# Patient Record
Sex: Female | Born: 1955 | Race: White | Hispanic: No | Marital: Married | Smoking: Former smoker
Health system: Southern US, Community
[De-identification: ages and names within clinical notes are randomized; demographics above are authoritative.]

## PROBLEM LIST (undated history)

## (undated) DIAGNOSIS — E559 Vitamin D deficiency, unspecified: Secondary | ICD-10-CM

## (undated) DIAGNOSIS — M1990 Unspecified osteoarthritis, unspecified site: Secondary | ICD-10-CM

## (undated) DIAGNOSIS — R55 Syncope and collapse: Secondary | ICD-10-CM

## (undated) DIAGNOSIS — Z6839 Body mass index (BMI) 39.0-39.9, adult: Secondary | ICD-10-CM

## (undated) DIAGNOSIS — M311 Thrombotic microangiopathy: Secondary | ICD-10-CM

## (undated) DIAGNOSIS — E876 Hypokalemia: Secondary | ICD-10-CM

## (undated) DIAGNOSIS — Z780 Asymptomatic menopausal state: Secondary | ICD-10-CM

## (undated) DIAGNOSIS — K219 Gastro-esophageal reflux disease without esophagitis: Secondary | ICD-10-CM

## (undated) DIAGNOSIS — C801 Malignant (primary) neoplasm, unspecified: Secondary | ICD-10-CM

## (undated) DIAGNOSIS — F32A Depression, unspecified: Secondary | ICD-10-CM

## (undated) DIAGNOSIS — F329 Major depressive disorder, single episode, unspecified: Secondary | ICD-10-CM

## (undated) DIAGNOSIS — M3119 Other thrombotic microangiopathy: Secondary | ICD-10-CM

## (undated) DIAGNOSIS — R7309 Other abnormal glucose: Secondary | ICD-10-CM

## (undated) DIAGNOSIS — G43909 Migraine, unspecified, not intractable, without status migrainosus: Secondary | ICD-10-CM

## (undated) DIAGNOSIS — A4901 Methicillin susceptible Staphylococcus aureus infection, unspecified site: Secondary | ICD-10-CM

## (undated) DIAGNOSIS — F331 Major depressive disorder, recurrent, moderate: Secondary | ICD-10-CM

## (undated) DIAGNOSIS — I8390 Asymptomatic varicose veins of unspecified lower extremity: Secondary | ICD-10-CM

## (undated) DIAGNOSIS — Z5189 Encounter for other specified aftercare: Secondary | ICD-10-CM

## (undated) DIAGNOSIS — M797 Fibromyalgia: Secondary | ICD-10-CM

## (undated) DIAGNOSIS — R51 Headache: Secondary | ICD-10-CM

## (undated) DIAGNOSIS — I872 Venous insufficiency (chronic) (peripheral): Secondary | ICD-10-CM

## (undated) DIAGNOSIS — M7662 Achilles tendinitis, left leg: Secondary | ICD-10-CM

## (undated) DIAGNOSIS — Z862 Personal history of diseases of the blood and blood-forming organs and certain disorders involving the immune mechanism: Secondary | ICD-10-CM

## (undated) DIAGNOSIS — N761 Subacute and chronic vaginitis: Secondary | ICD-10-CM

## (undated) DIAGNOSIS — R600 Localized edema: Secondary | ICD-10-CM

## (undated) DIAGNOSIS — E785 Hyperlipidemia, unspecified: Secondary | ICD-10-CM

## (undated) HISTORY — DX: Unspecified osteoarthritis, unspecified site: M19.90

## (undated) HISTORY — DX: Hypokalemia: E87.6

## (undated) HISTORY — DX: Asymptomatic menopausal state: Z78.0

## (undated) HISTORY — DX: Vitamin D deficiency, unspecified: E55.9

## (undated) HISTORY — DX: Syncope and collapse: R55

## (undated) HISTORY — DX: Morbid (severe) obesity due to excess calories: E66.01

## (undated) HISTORY — DX: Venous insufficiency (chronic) (peripheral): I87.2

## (undated) HISTORY — DX: Methicillin susceptible Staphylococcus aureus infection, unspecified site: A49.01

## (undated) HISTORY — DX: Asymptomatic varicose veins of unspecified lower extremity: I83.90

## (undated) HISTORY — DX: Localized edema: R60.0

## (undated) HISTORY — DX: Other abnormal glucose: R73.09

## (undated) HISTORY — DX: Fibromyalgia: M79.7

## (undated) HISTORY — DX: Thrombotic microangiopathy: M31.1

## (undated) HISTORY — DX: Personal history of diseases of the blood and blood-forming organs and certain disorders involving the immune mechanism: Z86.2

## (undated) HISTORY — DX: Gastro-esophageal reflux disease without esophagitis: K21.9

## (undated) HISTORY — DX: Subacute and chronic vaginitis: N76.1

## (undated) HISTORY — DX: Other thrombotic microangiopathy: M31.19

## (undated) HISTORY — DX: Hyperlipidemia, unspecified: E78.5

## (undated) HISTORY — DX: Achilles tendinitis, left leg: M76.62

## (undated) HISTORY — DX: Major depressive disorder, recurrent, moderate: F33.1

## (undated) HISTORY — PX: REVISION TOTAL HIP ARTHROPLASTY: SHX766

## (undated) HISTORY — DX: Body mass index (BMI) 39.0-39.9, adult: Z68.39

## (undated) HISTORY — DX: Migraine, unspecified, not intractable, without status migrainosus: G43.909

## (undated) MED FILL — Oxaliplatin IV Soln 50 MG/10ML: INTRAVENOUS | Qty: 50 | Status: AC

## (undated) MED FILL — Oxaliplatin IV Soln 100 MG/20ML: INTRAVENOUS | Qty: 60 | Status: AC

## (undated) MED FILL — Ravulizumab-cwvz IV Soln 300 MG/3ML (100 MG/ML): INTRAVENOUS | Qty: 27 | Status: AC

## (undated) MED FILL — Meningococcal (A, C, Y, and W-135) Oligo Conj Vac IM Soln: INTRAMUSCULAR | Qty: 0.5 | Status: AC

## (undated) MED FILL — Ravulizumab-cwvz IV Soln 300 MG/3ML (100 MG/ML): INTRAVENOUS | Qty: 33 | Status: AC

## (undated) MED FILL — Meningococcal Vac B (Recomb OMV Adjuv) Inj Prefilled Syringe: INTRAMUSCULAR | Qty: 0.5 | Status: AC

---
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
## 2006-01-01 ENCOUNTER — Ambulatory Visit: Payer: Self-pay | Admitting: Gastroenterology

## 2009-08-22 HISTORY — PX: JOINT REPLACEMENT: SHX530

## 2009-09-12 ENCOUNTER — Inpatient Hospital Stay (HOSPITAL_COMMUNITY): Admission: RE | Admit: 2009-09-12 | Discharge: 2009-09-15 | Payer: Self-pay | Admitting: Orthopedic Surgery

## 2010-06-23 ENCOUNTER — Encounter: Admission: RE | Admit: 2010-06-23 | Discharge: 2010-06-23 | Payer: Self-pay | Admitting: Orthopedic Surgery

## 2010-08-31 ENCOUNTER — Encounter: Admission: RE | Admit: 2010-08-31 | Discharge: 2010-08-31 | Payer: Self-pay | Admitting: Orthopedic Surgery

## 2010-09-08 ENCOUNTER — Encounter: Admission: RE | Admit: 2010-09-08 | Discharge: 2010-09-08 | Payer: Self-pay | Admitting: Orthopedic Surgery

## 2010-09-16 ENCOUNTER — Inpatient Hospital Stay (HOSPITAL_COMMUNITY): Admission: RE | Admit: 2010-09-16 | Discharge: 2010-09-18 | Payer: Self-pay | Admitting: Orthopedic Surgery

## 2010-10-11 ENCOUNTER — Inpatient Hospital Stay (HOSPITAL_COMMUNITY)
Admission: RE | Admit: 2010-10-11 | Discharge: 2010-10-14 | Payer: Self-pay | Source: Home / Self Care | Attending: Orthopedic Surgery | Admitting: Orthopedic Surgery

## 2010-10-28 ENCOUNTER — Emergency Department (HOSPITAL_COMMUNITY)
Admission: EM | Admit: 2010-10-28 | Discharge: 2010-10-28 | Payer: Self-pay | Source: Home / Self Care | Admitting: Emergency Medicine

## 2011-01-01 LAB — BASIC METABOLIC PANEL
BUN: 12 mg/dL (ref 6–23)
BUN: 8 mg/dL (ref 6–23)
BUN: 9 mg/dL (ref 6–23)
CO2: 27 mEq/L (ref 19–32)
Calcium: 8.9 mg/dL (ref 8.4–10.5)
Chloride: 107 mEq/L (ref 96–112)
Creatinine, Ser: 0.65 mg/dL (ref 0.4–1.2)
Creatinine, Ser: 0.69 mg/dL (ref 0.4–1.2)
Creatinine, Ser: 0.89 mg/dL (ref 0.4–1.2)
GFR calc Af Amer: >60 mL/min (ref >60)
GFR calc non Af Amer: >60 mL/min (ref >60)
GFR calc non Af Amer: >60 mL/min (ref >60)
Glucose, Bld: 117 mg/dL — ABNORMAL HIGH (ref 70–99)
Glucose, Bld: 170 mg/dL — ABNORMAL HIGH (ref 70–99)

## 2011-01-01 LAB — CBC
MCH: 27.1 pg (ref 26.0–34.0)
MCH: 27.5 pg (ref 26.0–34.0)
MCHC: 31.3 g/dL (ref 30.0–36.0)
MCV: 84.5 fL (ref 78.0–100.0)
MCV: 84.9 fL (ref 78.0–100.0)
Platelets: 207 10*3/uL (ref 150–400)
Platelets: 207 10*3/uL (ref 150–400)
Platelets: 225 10*3/uL (ref 150–400)
Platelets: 263 10*3/uL (ref 150–400)
RBC: 3.16 MIL/uL — ABNORMAL LOW (ref 3.87–5.11)
RDW: 14.4 % (ref 11.5–15.5)
RDW: 14.6 % (ref 11.5–15.5)
RDW: 14.7 % (ref 11.5–15.5)
RDW: 14.8 % (ref 11.5–15.5)
WBC: 7.3 10*3/uL (ref 4.0–10.5)
WBC: 7.5 10*3/uL (ref 4.0–10.5)

## 2011-01-01 LAB — PROTIME-INR
INR: 1.06 (ref 0.00–1.49)
Prothrombin Time: 14 seconds (ref 11.6–15.2)

## 2011-01-01 LAB — DIFFERENTIAL
Basophils Absolute: 0 10*3/uL (ref 0.0–0.1)
Eosinophils Relative: 2 % (ref 0–5)
Lymphocytes Relative: 24 % (ref 12–46)
Neutro Abs: 4.9 10*3/uL (ref 1.7–7.7)
Neutrophils Relative %: 66 % (ref 43–77)

## 2011-01-01 LAB — WOUND CULTURE

## 2011-01-01 LAB — APTT: aPTT: 38 seconds — ABNORMAL HIGH (ref 24–37)

## 2011-01-01 LAB — URINALYSIS, ROUTINE W REFLEX MICROSCOPIC
Glucose, UA: NEGATIVE mg/dL
Protein, ur: NEGATIVE mg/dL

## 2011-01-01 LAB — TYPE AND SCREEN
Antibody Screen: NEGATIVE
Unit division: 0

## 2011-01-01 LAB — GRAM STAIN

## 2011-01-01 LAB — ANAEROBIC CULTURE

## 2011-01-02 LAB — WOUND CULTURE

## 2011-01-02 LAB — BASIC METABOLIC PANEL
BUN: 13 mg/dL (ref 6–23)
CO2: 25 mEq/L (ref 19–32)
CO2: 27 mEq/L (ref 19–32)
Calcium: 8.9 mg/dL (ref 8.4–10.5)
Chloride: 103 mEq/L (ref 96–112)
Chloride: 105 mEq/L (ref 96–112)
Chloride: 106 mEq/L (ref 96–112)
Creatinine, Ser: 0.78 mg/dL (ref 0.4–1.2)
GFR calc Af Amer: >60 mL/min (ref >60)
GFR calc Af Amer: >60 mL/min (ref >60)
Glucose, Bld: 90 mg/dL (ref 70–99)
Potassium: 4.2 mEq/L (ref 3.5–5.1)
Potassium: 4.7 mEq/L (ref 3.5–5.1)
Sodium: 139 mEq/L (ref 135–145)

## 2011-01-02 LAB — CBC
HCT: 27.6 % — ABNORMAL LOW (ref 36.0–46.0)
HCT: 35.2 % — ABNORMAL LOW (ref 36.0–46.0)
Hemoglobin: 10.2 g/dL — ABNORMAL LOW (ref 12.0–15.0)
Hemoglobin: 9.4 g/dL — ABNORMAL LOW (ref 12.0–15.0)
MCH: 28.2 pg (ref 26.0–34.0)
MCH: 28.4 pg (ref 26.0–34.0)
MCHC: 34.3 g/dL (ref 30.0–36.0)
MCV: 82.7 fL (ref 78.0–100.0)
MCV: 82.9 fL (ref 78.0–100.0)
Platelets: 251 10*3/uL (ref 150–400)
RBC: 3.34 MIL/uL — ABNORMAL LOW (ref 3.87–5.11)
RBC: 3.59 MIL/uL — ABNORMAL LOW (ref 3.87–5.11)
RDW: 13.5 % (ref 11.5–15.5)
WBC: 10.4 10*3/uL (ref 4.0–10.5)

## 2011-01-02 LAB — DIFFERENTIAL
Basophils Absolute: 0 10*3/uL (ref 0.0–0.1)
Basophils Relative: 1 % (ref 0–1)
Eosinophils Relative: 1 % (ref 0–5)
Monocytes Absolute: 0.7 10*3/uL (ref 0.1–1.0)

## 2011-01-02 LAB — URINALYSIS, ROUTINE W REFLEX MICROSCOPIC
Bilirubin Urine: NEGATIVE
Ketones, ur: NEGATIVE mg/dL
Nitrite: NEGATIVE
pH: 7 (ref 5.0–8.0)

## 2011-01-02 LAB — ANAEROBIC CULTURE

## 2011-01-02 LAB — SURGICAL PCR SCREEN: Staphylococcus aureus: NEGATIVE

## 2011-01-10 NOTE — H&P (Signed)
[name], [name]                 ACCOUNT NO.:  [id number]  MEDICAL RECORD NO.:  [id number]          PATIENT TYPE:  INP  LOCATION:  1619                         FACILITY:  [facility]  PHYSICIAN:  Madlyn Frankel. Charlann Boxer, M.D.  DATE OF BIRTH:  1956/07/02  DATE OF ADMISSION:  09/16/2010 DATE OF DISCHARGE:  09/18/2010                             HISTORY & PHYSICAL   ADMISSION DIAGNOSES: 1. Failed right total hip arthroplasty. 2. History of thrombocytopenic purpura in 1990 requiring multiple     transfusions of platelets. 3. History of osteoarthritis and varicose veins. 4. Importantly, history of right total hip replacement that was     performed on September 12, 2009, one year ago. .  ADMITTING HISTORY:  [name] is a very pleasant 55 year old female patient of one of my partners who had had right total hip replacement performed on September 12, 2009.  She had some episodes of erythema requiring treatment with antibiotics.  She was initially seen and evaluated and felt to have a reaction to metal as her workup was inconclusive for infection.  She had a previous metal-on-metal implant.  Given the fact that she had persistent pain as well as this erythematous reaction, it was determined that she ought to consider arthroplasty revision surgery as an option at this point.  Risks and benefits were discussed in the office prior to getting her set up for surgery. Surgery was scheduled for September 16, 2010.  PAST MEDICAL HISTORY: 1. Osteoarthritis. 2. History of right hip osteoarthritis with right total hip     replacement performed on September 12, 2009. 3. Varicose veins. 4. History of thrombocytopenic purpura.  PAST SURGICAL HISTORY: 1. Tonsillectomy. 2. Right total hip replacement as noted.  FAMILY MEDICAL HISTORY: 1. Alzheimer's. 2. Prostate cancer. 3. Epilepsy.  DRUG ALLERGIES:  No known drug allergies.  CURRENT MEDICATIONS: 1. Naprosyn as needed. 2. Percocet.  SOCIAL HISTORY:   She is married, works as a Psychologist, forensic.  She denies any significant smoking or alcohol use.  She quit smoking 20 years ago.  PHYSICAL EXAMINATION:  VITAL SIGNS:  She is seen and evaluated at the time of the admission through the office and was afebrile with stable vital signs. NEUROLOGIC:  Her previous examination has been unchanged with no slurred speech or blurred vision. CHEST:  Clear to auscultation bilaterally without wheezing. HEART:  Regular rate and rhythm.  No murmur. ABDOMEN:  Soft, nontender, and overweight. EXTREMITIES:  She has erythematous area on the right lateral thigh area, otherwise well-healed incision.  No drainage. MUSCULOSKELETAL:  She has some concern for some fluctuance over the lateral side of the hip.  Hip range of motion is not significantly painful.  She otherwise is neurovascularly intact.  DIAGNOSTIC STUDIES:  Hospital workup of EKG, labs, and chest x-ray are all pending evaluation.  Plain films of the hips are available through our office.  ASSESSMENT:  Failed right total hip arthroplasty.  At this point, with presumed diagnosis of metal reaction.  PLAN:  After reviewing [name] and her husband her current situation, she is scheduled for same-day surgery for September 16, 2010, for  revision surgery versus resection.  Questions were encouraged, answered, and reviewed regarding this.  Consent will be obtained based on the discussion today.     Madlyn Frankel Charlann Boxer, M.D.     MDO/MEDQ  D:  01/08/2011  T:  01/09/2011  Job:  [id number]  Electronically Signed by Durene Romans M.D. on 01/10/2011 07:19:59 PM

## 2011-01-11 NOTE — Discharge Summary (Signed)
NAME:  [name], [name] NO.:  [id number]  MEDICAL RECORD NO.:  [id number]          PATIENT TYPE:  INP  LOCATION:  1619                         FACILITY:  [facility]  PHYSICIAN:  Madlyn Frankel. Charlann Boxer, M.D.  DATE OF BIRTH:  May 20, 1956  DATE OF ADMISSION:  10/11/2010 DATE OF DISCHARGE:  10/14/2010                              DISCHARGE SUMMARY   ADMITTING DIAGNOSIS:  Infected right hip wound.  DISCHARGE DIAGNOSES: 1. Infected right total hip replacement. 2. History of thrombotic thrombocytopenia in varicose veins,     questionable history of hepatitis C.  BRIEF HISTORY:  [name] is a 55 year old female with a history of right total hip replacement done in 2010.  She had early wound problems with marked cellulitic changes with minimal wound drainage.  She was treated conservatively.  She was ultimately seen and evaluated in consult due to this with elevated CRP and sed rate values concerning for either metallosis for infection; however, infection was not completely included in the diagnosis.  In November 2011, she moved back to the operating room for revision of right hip surgery to remove the metal-on- metal components and place ceramic on poly.  At that time there was not an obvious clinical concern for infection.  There appeared to be some fluid collection inside the joint but it did not appear to be obvious pus nonetheless in her postoperative period.  Now, we are month or so out, she has had increasing erythema with development of a fluctuant area in her incision line.  She was set up for an I and D of wound.  HOSPITAL COURSE:  The patient was admitted for surgery on October 11, 2010.  At that time, she was noted to have a right hip infection.  She underwent an I and D of the right hip with normal saline solution without polyethylene exchange.  She had her wound closed primarily.  She was then transferred to the recovery room and then subsequently to orthopedic  ward where she remained for the remainder of her stay until postop day 3.  PICC line was placed and she was started on IV Cubicin per my selection and her understanding of the toxicity with the potential need for bacterial side wall affected this medicine.  Arrangements were made for 6 weeks on this IV antibiotic.  Her wound cellulitic changes improved without significant wound drainage.  On postop day 3, she had a hematocrit of 27.2 with platelets of 207.  On postop day #2, was noted to have hematocrit down to 21.4 and subsequently was transfused with 2 units packed red blood cells.  The plan was to transfuse 2 units of blood and have her discharge the next day.  She was seen and evaluated by home health agency and set up for IV antibiotics at home.  DISCHARGE INSTRUCTIONS:  The patient will be discharged home with home health physical therapy in addition to nursing for IV antibiotics and setting this up.  She will follow up with me in 2 weeks for wound check and evaluation.  DISCHARGE MEDICATIONS: 1. Cubicin 500 mg every day for 40 days.  2. Colace 100 mg p.o. b.i.d. for constipation while on pain medicine. 3. Iron 325 mg 2 to 3 times a day for 3 weeks. 4. Norco 5/325 one to two tablets every 4-6 hours as needed for pain. 5. Robaxin 500 mg p.o. q.6 hours p.r.n. for muscle spasm and pain. 6. MiraLax 17 g p.o. daily for constipation while on pain medicine. 7. Xarelto 10 mg p.o. daily for 10 days and then aspirin for 4 weeks.  Questions were encouraged at the time of discharge.     Madlyn Frankel Charlann Boxer, M.D.     MDO/MEDQ  D:  01/10/2011  T:  01/11/2011  Job:  [id number]  Electronically Signed by Durene Romans M.D. on 01/11/2011 01:30:20 PM

## 2011-01-24 LAB — URINE MICROSCOPIC-ADD ON

## 2011-01-24 LAB — URINALYSIS, ROUTINE W REFLEX MICROSCOPIC
Bilirubin Urine: NEGATIVE
Glucose, UA: NEGATIVE mg/dL
Hgb urine dipstick: NEGATIVE
Ketones, ur: NEGATIVE mg/dL
Specific Gravity, Urine: 1.012 (ref 1.005–1.030)
pH: 6 (ref 5.0–8.0)

## 2011-01-24 LAB — DIFFERENTIAL
Basophils Relative: 1 % (ref 0–1)
Eosinophils Absolute: 0.1 10*3/uL (ref 0.0–0.7)
Eosinophils Relative: 2 % (ref 0–5)
Lymphs Abs: 1.6 10*3/uL (ref 0.7–4.0)
Monocytes Relative: 7 % (ref 3–12)
Neutrophils Relative %: 63 % (ref 43–77)

## 2011-01-24 LAB — PROTIME-INR
INR: 0.95 (ref 0.00–1.49)
INR: 2.37 — ABNORMAL HIGH (ref 0.00–1.49)

## 2011-01-24 LAB — HEMOGLOBIN AND HEMATOCRIT, BLOOD
HCT: 28 % — ABNORMAL LOW (ref 36.0–46.0)
HCT: 33.7 % — ABNORMAL LOW (ref 36.0–46.0)
Hemoglobin: 10.8 g/dL — ABNORMAL LOW (ref 12.0–15.0)
Hemoglobin: 11.4 g/dL — ABNORMAL LOW (ref 12.0–15.0)
Hemoglobin: 9.8 g/dL — ABNORMAL LOW (ref 12.0–15.0)
Hemoglobin: 9.9 g/dL — ABNORMAL LOW (ref 12.0–15.0)

## 2011-01-24 LAB — COMPREHENSIVE METABOLIC PANEL
ALT: 17 U/L (ref 0–35)
AST: 17 U/L (ref 0–37)
Alkaline Phosphatase: 76 U/L (ref 39–117)
CO2: 25 mEq/L (ref 19–32)
Calcium: 9.1 mg/dL (ref 8.4–10.5)
GFR calc Af Amer: >60 mL/min (ref >60)
GFR calc non Af Amer: >60 mL/min (ref >60)
Glucose, Bld: 107 mg/dL — ABNORMAL HIGH (ref 70–99)
Potassium: 3.9 mEq/L (ref 3.5–5.1)
Sodium: 136 mEq/L (ref 135–145)
Total Protein: 6.4 g/dL (ref 6.0–8.3)

## 2011-01-24 LAB — CBC
Hemoglobin: 13.1 g/dL (ref 12.0–15.0)
RBC: 4.31 MIL/uL (ref 3.87–5.11)

## 2011-01-24 LAB — TYPE AND SCREEN: Antibody Screen: NEGATIVE

## 2011-03-01 ENCOUNTER — Encounter: Payer: Self-pay | Admitting: Gastroenterology

## 2011-03-01 HISTORY — PX: COLONOSCOPY: SHX174

## 2011-03-09 ENCOUNTER — Encounter: Payer: Self-pay | Admitting: Infectious Disease

## 2011-03-09 ENCOUNTER — Ambulatory Visit (INDEPENDENT_AMBULATORY_CARE_PROVIDER_SITE_OTHER): Payer: BC Managed Care – PPO | Admitting: Infectious Disease

## 2011-03-09 DIAGNOSIS — T84019A Broken internal joint prosthesis, unspecified site, initial encounter: Secondary | ICD-10-CM | POA: Insufficient documentation

## 2011-03-09 DIAGNOSIS — Z862 Personal history of diseases of the blood and blood-forming organs and certain disorders involving the immune mechanism: Secondary | ICD-10-CM

## 2011-03-09 DIAGNOSIS — T84018A Broken internal joint prosthesis, other site, initial encounter: Secondary | ICD-10-CM

## 2011-03-09 DIAGNOSIS — A4101 Sepsis due to Methicillin susceptible Staphylococcus aureus: Secondary | ICD-10-CM

## 2011-03-09 DIAGNOSIS — M0080 Arthritis due to other bacteria, unspecified joint: Secondary | ICD-10-CM | POA: Insufficient documentation

## 2011-03-09 DIAGNOSIS — A4901 Methicillin susceptible Staphylococcus aureus infection, unspecified site: Secondary | ICD-10-CM | POA: Insufficient documentation

## 2011-03-09 DIAGNOSIS — G5600 Carpal tunnel syndrome, unspecified upper limb: Secondary | ICD-10-CM | POA: Insufficient documentation

## 2011-03-09 DIAGNOSIS — Z96649 Presence of unspecified artificial hip joint: Secondary | ICD-10-CM

## 2011-03-09 DIAGNOSIS — G5601 Carpal tunnel syndrome, right upper limb: Secondary | ICD-10-CM

## 2011-03-09 DIAGNOSIS — M009 Pyogenic arthritis, unspecified: Secondary | ICD-10-CM

## 2011-03-09 HISTORY — DX: Broken internal joint prosthesis, unspecified site, initial encounter: T84.019A

## 2011-03-09 HISTORY — DX: Methicillin susceptible Staphylococcus aureus infection, unspecified site: A49.01

## 2011-03-09 HISTORY — DX: Arthritis due to other bacteria, unspecified joint: M00.80

## 2011-03-09 HISTORY — DX: Personal history of diseases of the blood and blood-forming organs and certain disorders involving the immune mechanism: Z86.2

## 2011-03-09 HISTORY — DX: Carpal tunnel syndrome, unspecified upper limb: G56.00

## 2011-03-09 LAB — CBC WITH DIFFERENTIAL/PLATELET
Basophils Absolute: 0 10*3/uL (ref 0.0–0.1)
Basophils Relative: 0 % (ref 0–1)
Eosinophils Absolute: 0.1 10*3/uL (ref 0.0–0.7)
Eosinophils Relative: 2 % (ref 0–5)
MCH: 28 pg (ref 26.0–34.0)
MCHC: 32.8 g/dL (ref 30.0–36.0)
MCV: 85.4 fL (ref 78.0–100.0)
Platelets: 191 10*3/uL (ref 150–400)
RDW: 15.3 % (ref 11.5–15.5)

## 2011-03-09 MED ORDER — DOXYCYCLINE HYCLATE 100 MG PO TABS: 100.0000 mg | ORAL_TABLET | Freq: Two times a day (BID) | ORAL | Status: AC

## 2011-03-09 NOTE — Progress Notes (Signed)
Subjective:    Patient ID: [name], female    DOB: 1956-10-03, 56 y.o.   MRN: [id number]  HPI  [name] is a very pleasant 55 year old Caucasian female who underwent right hip arthroplasty in November of 2010. He had been doing well apparently until the summer of June 2011 when she apparently developed pain in her right hip as well as swelling at the operative site.  She states she was placed on antibiotics at that time and has been on antibiotics largely since that time. She ultimately was taken to the operating room in November to address was thought to be due to a metallosis induced painful hip. She underwent revision of her total hip arthroplasty at that time by Dr. Donna Silverman Moras on 09/16/2010. Interestingly intraoperative cultures did grow methicillin sensitive Staphylococcus aureus from the surgical site and in November. She appears to been given oral antibiotics thereafter she believes doxycycline or Bactrim. Unfortunately shortly thereafter her hip became more painful to the point where she could not walk and there is significant amount of urine seen and is obvious superficial infection. She was found to have a right thigh abscess and underwent I&D of this without removal of the polyethylene exchange. While initial intraoperative Gram stains were negative cultures from the hip ultimately did grow methicillin sensitive Staphylococcus aureus once again. I'm not sure if and was aware of these cultures the patient indicates was treated with broader coverage with daptomycin to cover methicillin-resistant Staphylococcus aureus. She received at 6 weeks of this followed by oral Bactrim and doxycycline. She been feeling much better and doing well. Then approximately a month ago she stopped taking her doxycycline and Bactrim. Apparently 9 days after stopping antibiotics she developed severe pain in her right hip and inability to bear weight she was placed back on Bactrim and doxycycline. She's been seen by Dr.  Sahily Biddle Moras several times since then. He checked a sedimentation rate which was elevated at 42 and a C-reactive protein that was 9.4. He attempted an aspiration but this was not successful. He is lightly greatly concerned the patient still has an infection of the prosthetic hip and that she will require an incision and debridement in the operating room. My understanding is that he is planning on taking out his match of the prosthetic material the operating room. The patient initially was concerned about how to go through another surgery and also wished for infectious disease be involved in her care. Therefore Dr. Tyquez Hollibaugh Moras assess his see this patient we are happy be involved. The patient is also noticed in the last week that she's had numbness in her right hand in the first through fourth digits. On exam she is a positive Tinel's sign which I think is highly consistent with carpal tunnel syndrome.  I fully support Dr. Orvan July decision to do an incision and debridement and remove as much prosthetic material as possible and then follow this with a 6-8 weeks of intravenous antibiotics in the form of cefazolin 2 g IV every 8 hours plus possibly rifampin if they're still prosthetic material retained at the site. I will have her follow closely and follow her sedimentation rates and C-reactive proteins and clinical status. I would t likely also keep her o oral antibiotics for a good period of time after finishing intravenous antibiotics and after reimplantation of the hip. In all I spent greater than hour of time with the patient including greater than 50% of time in counseling the patient reviewing the records and coordinate care  with Dr. Charlann Boxer.   Review of Systems  Constitutional: Positive for fever, chills, activity change and fatigue. Negative for appetite change and unexpected weight change.  HENT: Negative for facial swelling, neck pain, neck stiffness and ear discharge.   Eyes: Negative for photophobia and visual  disturbance.  Respiratory: Negative for apnea and chest tightness.   Cardiovascular: Negative for chest pain, palpitations and leg swelling.  Gastrointestinal: Negative for abdominal pain, constipation, blood in stool, abdominal distention and anal bleeding.  Genitourinary: Negative for dysuria and difficulty urinating.  Musculoskeletal: Positive for myalgias, joint swelling and arthralgias. Negative for back pain and gait problem.  Skin: Negative for color change and pallor.  Neurological: Negative for dizziness, tremors and weakness.  Hematological: Negative for adenopathy. Does not bruise/bleed easily.  Psychiatric/Behavioral: Negative for behavioral problems, confusion, decreased concentration and agitation.       Objective:   Physical Exam  Constitutional: She is oriented to person, place, and time. She appears well-developed and well-nourished. No distress.  HENT:  Head: Normocephalic and atraumatic.  Mouth/Throat: Oropharynx is clear and moist. No oropharyngeal exudate.  Eyes: Conjunctivae and EOM are normal. Pupils are equal, round, and reactive to light. No scleral icterus.  Neck: Normal range of motion. Neck supple.  Cardiovascular: Normal rate, regular rhythm and normal heart sounds.  Exam reveals no gallop.   No murmur heard. Pulmonary/Chest: Breath sounds normal. No respiratory distress. She has no rales. She exhibits no tenderness.  Abdominal: Soft. She exhibits no distension.  Musculoskeletal: She exhibits no edema.       Positive Tinel sign  Neurological: She is alert and oriented to person, place, and time. Coordination normal.  Skin: No rash noted. She is not diaphoretic. No pallor.       The hip surgical site is clean dry and intact without evidence of peritonsillar erythema at the site is certainly quite warm to touch.  Psychiatric: She has a normal mood and affect. Her behavior is normal. Judgment and thought content normal.          Assessment & Plan:    Septic hip Methicillin sensitive staphylococcal aureus septic hip. She clearly does need I&D of the hip. I will have her continue the doxycycline for now but stop it 7 days prior to her surgery to maximize the yield on the cultures in the operating room though I be shocked if we don't find methicillin sensitive staph aureus again.. I agree with removing as much of the prosthetic material as possible if not all of it. I will then give her 8 weeks of intravenous cefazolin dose 2 g IV every 8 hours. If there is any prosthetic material left I would also add rifampin at 300 mg twice daily to his regimen. I would continue this and then follow this with some oral antibiotics, an oral fluoroquinolone with the rifampin might be a reasonable option with excellent tissue penetration. Alternatively Keflex and rifampin would be another option. I agree back from vacation on June 11 and understands her surgery which is going to be happening around this time I be happy to see the patient was along when she comes in for her surgery to help direct her antibacterial antibiotic therapy and care.  MSSA (methicillin susceptible Staphylococcus aureus) septicemia This is undoubtedlythe culprit organism  Carpal tunnel syndrome of right wrist Clinically this seems to be carpal tunnel syndrome. Giving given her history of infection though we can keep in the back of our mind the idea that she will have  a cervical spine problem but I think this is unlikely at this point in time medics carpal tunnel syndrome fits clinically this was a positive Tinel's sign however in for a brace for her.

## 2011-03-09 NOTE — Assessment & Plan Note (Signed)
This is undoubtedlythe culprit organism

## 2011-03-09 NOTE — Patient Instructions (Signed)
You may continue the antiibiotics for now but please STOP them at least 7 days prior to surgery to maximize yield on cultures I would like to treat you with 8 weeks of IV cefazolin 2grams three times daily plus or minus rifampin  I will write a script for a wrist splint

## 2011-03-09 NOTE — Assessment & Plan Note (Signed)
Clinically this seems to be carpal tunnel syndrome. Giving given her history of infection though we can keep in the back of our mind the idea that she will have a cervical spine problem but I think this is unlikely at this point in time medics carpal tunnel syndrome fits clinically this was a positive Tinel's sign however in for a brace for her.

## 2011-03-09 NOTE — Assessment & Plan Note (Signed)
Methicillin sensitive staphylococcal aureus septic hip. She clearly does need I&D of the hip. I will have her continue the doxycycline for now but stop it 7 days prior to her surgery to maximize the yield on the cultures in the operating room though I be shocked if we don't find methicillin sensitive staph aureus again.. I agree with removing as much of the prosthetic material as possible if not all of it. I will then give her 8 weeks of intravenous cefazolin dose 2 g IV every 8 hours. If there is any prosthetic material left I would also add rifampin at 300 mg twice daily to his regimen. I would continue this and then follow this with some oral antibiotics, an oral fluoroquinolone with the rifampin might be a reasonable option with excellent tissue penetration. Alternatively Keflex and rifampin would be another option. I agree back from vacation on June 11 and understands her surgery which is going to be happening around this time I be happy to see the patient was along when she comes in for her surgery to help direct her antibacterial antibiotic therapy and care.

## 2011-03-10 LAB — COMPLETE METABOLIC PANEL WITH GFR
ALT: 10 U/L (ref 0–35)
AST: 13 U/L (ref 0–37)
CO2: 23 mEq/L (ref 19–32)
Creat: 0.82 mg/dL (ref 0.40–1.20)
GFR, Est African American: >60 mL/min (ref >60)
Total Bilirubin: 0.4 mg/dL (ref 0.3–1.2)

## 2011-03-21 ENCOUNTER — Encounter: Payer: Self-pay | Admitting: Infectious Disease

## 2011-04-02 ENCOUNTER — Ambulatory Visit (HOSPITAL_COMMUNITY)
Admission: RE | Admit: 2011-04-02 | Discharge: 2011-04-02 | Disposition: A | Discharge status: Home / Self Care | Payer: BC Managed Care – PPO | Source: Ambulatory Visit | Attending: Orthopedic Surgery | Admitting: Orthopedic Surgery

## 2011-04-02 ENCOUNTER — Telehealth: Payer: Self-pay | Admitting: *Deleted

## 2011-04-02 NOTE — Telephone Encounter (Signed)
States she saw Dr. Charlann Boxer & had labs done. She cancelled her surgery because they "did not indicate the need for surgery. She has been off antibiotics for 2 weeks now & wants to know if she should restart meds or not. I called Dr. Nilsa Nutting to have them fax the labs here for her md here  I had to leave a message for medical records. Message to Dr. Daiva Eves to see if he wants to see her or if the faxed labs should be enough

## 2011-04-03 NOTE — Telephone Encounter (Signed)
I spoke with her spouse & asked that she call me. She will be home soon. Asked that if she misses us/calls after 5- call in am after 8:30

## 2011-04-03 NOTE — Telephone Encounter (Signed)
I gave her the md message. She will call her surgeon to see about recheduling

## 2011-04-03 NOTE — Telephone Encounter (Signed)
I called relative at her house and left a message. I MYSELF explained the need for surgery to TREAT the infection. The purpose of being off of antibiotics prior to surgery was to increase diagnostic yield on cultures. BOTTOM line she needs SURGERY followed by 6-8 weeks of INtravenous antibiotics and close followup. Even where she were to go back on antibiotics she would still need the surgery PERIOD

## 2011-04-17 ENCOUNTER — Telehealth: Payer: Self-pay | Admitting: Infectious Disease

## 2011-04-18 NOTE — Telephone Encounter (Signed)
Tried to call pt but she is now in OR with Dr. Charlann Boxer

## 2011-04-28 IMAGING — CR DG CHEST 1V PORT
1 series · 1 of 1 positions shown · non-contrast
Comparison: None.

CLINICAL DATA: PICC line placement.  Infected right hip.

PORTABLE CHEST - 1 VIEW

[AP]
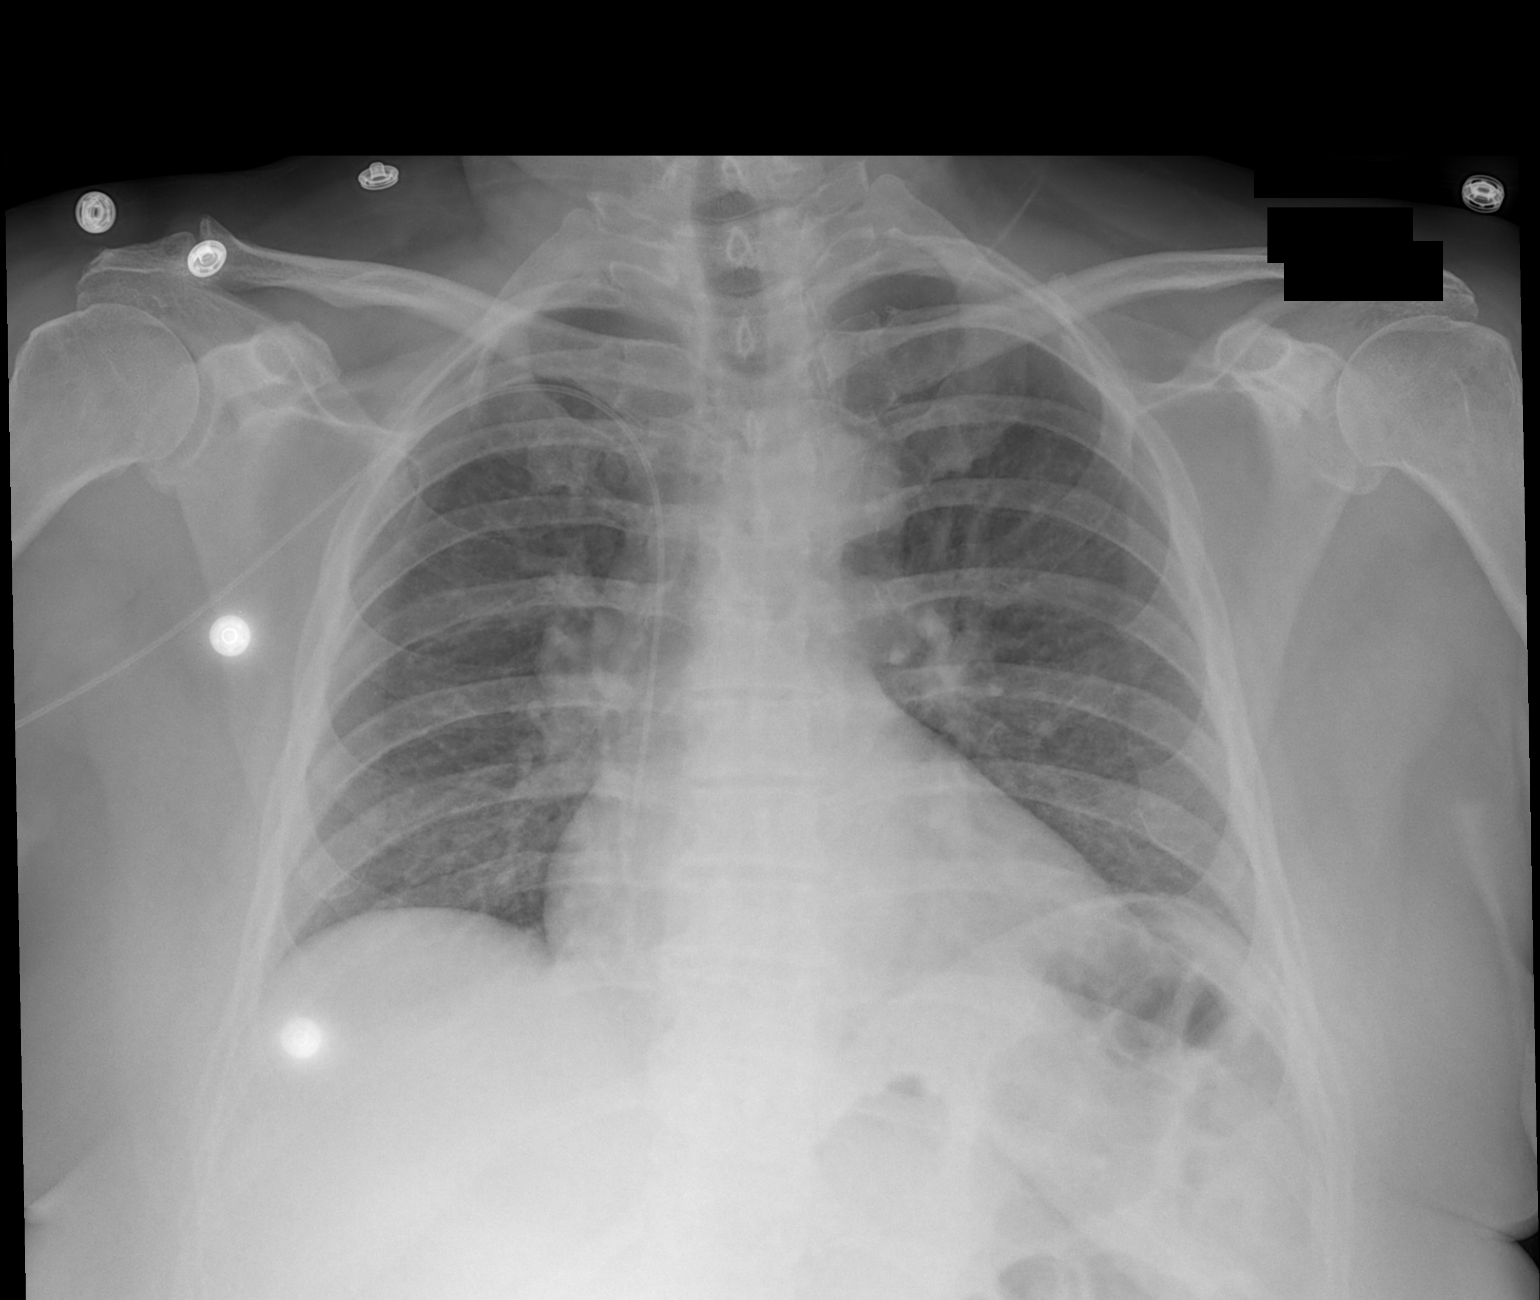

[1 of 1 positions shown; findings below may reference images not displayed]

FINDINGS: PICC line tip is in the right atrium and could be
retracted 3 cm.

The heart size and vascularity are normal and the lungs are clear.
No significant osseous abnormality.
IMPRESSION: No acute disease in the chest.  PICC line is in the right atrium
and could be retracted 3 cm.

## 2011-09-20 DIAGNOSIS — Z96649 Presence of unspecified artificial hip joint: Secondary | ICD-10-CM | POA: Insufficient documentation

## 2011-09-20 DIAGNOSIS — T8484XA Pain due to internal orthopedic prosthetic devices, implants and grafts, initial encounter: Secondary | ICD-10-CM

## 2011-09-20 HISTORY — DX: Presence of unspecified artificial hip joint: T84.84XA

## 2011-09-20 HISTORY — DX: Presence of unspecified artificial hip joint: Z96.649

## 2011-10-05 ENCOUNTER — Encounter (HOSPITAL_COMMUNITY): Payer: Self-pay | Admitting: Pharmacy Technician

## 2011-10-05 ENCOUNTER — Encounter (HOSPITAL_COMMUNITY): Payer: Self-pay | Admitting: *Deleted

## 2011-10-05 NOTE — H&P (Signed)
  [name] is an 55 y.o. female.    Chief Complaint: Right hip infection / inflammation reaction to joint prosthesis  HPI: Pt is a 55 y.o. female complaining of right hip pain.  Pt orginally had a total hip arthroplasty in November of 2010. Subsequently she had a revision of the total hip in November of 2011. In December of that year she had an abscess of the right hip.  Pain had continually increased since having surgery.  Pt has tried various conservative treatments which have failed to alleviate their symptoms. Various options are discussed with the patient. Risks, benefits and expectations were discussed with the patient. Patient understand the risks, benefits and expectations and wishes to proceed with surgery.   PCP:  No primary provider on file.  D/C Plans: Home with HHPT or Rehab  Post-op Meds: No Rx given  Tranexamic Acid: Not to be given  PMH: 1. TTP Syndrome 2. Migraines 3. Varicose Veins  PSH: 1. Tonsillectomy 2. Right THA 3. Right TH revision 4. Right hip I&D  Social History:  reports that she has quit smoking. Her smoking use included Cigarettes. She quit after 20 years of use. She quit smokeless tobacco use about 20 years ago. She reports that she drinks alcohol. She reports that she does not use illicit drugs.  Allergies:  No Known Allergies  Medications: 1. APAP - prn 2. Ibuprofen - prn 3. Norco - prn   ROS: Review of Systems  Constitutional: Negative.   HENT: Negative.   Eyes: Negative.   Respiratory: Negative.   Cardiovascular: Negative.   Gastrointestinal: Negative.   Genitourinary: Negative.   Musculoskeletal: Positive for joint pain (right hip).  Skin: Negative.   Neurological: Negative.   Endo/Heme/Allergies: Negative.   Psychiatric/Behavioral: Negative.      Physican Exam: Vitals: 127/76 ; HR: 86 ; Resp : 16; Physical Exam  Constitutional: She is oriented to person, place, and time and well-developed, well-nourished, and in no  distress.  HENT:  Head: Normocephalic and atraumatic.  Eyes: Conjunctivae and EOM are normal. Pupils are equal, round, and reactive to light.  Neck: Neck supple. No JVD present. No tracheal deviation present. No thyromegaly present.  Cardiovascular: Normal rate, regular rhythm, normal heart sounds and intact distal pulses.   Pulmonary/Chest: Effort normal and breath sounds normal. No stridor. No respiratory distress. She has no wheezes. She has no rales. She exhibits no tenderness.  Abdominal: Soft. There is no tenderness. There is no guarding.  Musculoskeletal:       Right hip: She exhibits decreased range of motion, decreased strength and tenderness. She exhibits no swelling and no crepitus.  Lymphadenopathy:    She has no cervical adenopathy.  Neurological: She is alert and oriented to person, place, and time.  Skin: Skin is warm and dry. No rash noted. No erythema. No pallor.  Psychiatric: Affect normal.       Assessment/Plan Assessment: Right hip infection / inflammation reaction to joint prosthesis  Plan: Patient will undergo a revision of the right total hip arthroplasty on 10/08/2011. Risks benefits and expectation were discussed with the patient. Patient understand risks, benefits and expectation and wishes to proceed.  Anastasio Auerbach [name]   PAC  10/07/2011, 6:25 PM

## 2011-10-05 NOTE — Pre-Procedure Instructions (Signed)
Patient states had menstrual cycle 03/2011 and none since then; last menstrual cycle was 2011.

## 2011-10-08 ENCOUNTER — Inpatient Hospital Stay (HOSPITAL_COMMUNITY): Payer: BC Managed Care – PPO

## 2011-10-08 ENCOUNTER — Inpatient Hospital Stay (HOSPITAL_COMMUNITY)
Admission: RE | Admit: 2011-10-08 | Discharge: 2011-10-13 | DRG: 817 | Disposition: A | Discharge status: Home Health | Payer: BC Managed Care – PPO | Source: Ambulatory Visit | Attending: Orthopedic Surgery | Admitting: Orthopedic Surgery

## 2011-10-08 ENCOUNTER — Encounter (HOSPITAL_COMMUNITY): Payer: Self-pay | Admitting: Anesthesiology

## 2011-10-08 ENCOUNTER — Encounter (HOSPITAL_COMMUNITY): Payer: Self-pay | Admitting: *Deleted

## 2011-10-08 ENCOUNTER — Encounter (HOSPITAL_COMMUNITY)
Admission: RE | Disposition: A | Discharge status: Home Health | Payer: Self-pay | Source: Ambulatory Visit | Attending: Orthopedic Surgery

## 2011-10-08 ENCOUNTER — Inpatient Hospital Stay (HOSPITAL_COMMUNITY): Payer: BC Managed Care – PPO | Admitting: Anesthesiology

## 2011-10-08 DIAGNOSIS — Z87891 Personal history of nicotine dependence: Secondary | ICD-10-CM

## 2011-10-08 DIAGNOSIS — R110 Nausea: Secondary | ICD-10-CM | POA: Diagnosis not present

## 2011-10-08 DIAGNOSIS — Z96649 Presence of unspecified artificial hip joint: Secondary | ICD-10-CM

## 2011-10-08 DIAGNOSIS — A498 Other bacterial infections of unspecified site: Secondary | ICD-10-CM | POA: Diagnosis present

## 2011-10-08 DIAGNOSIS — T8450XA Infection and inflammatory reaction due to unspecified internal joint prosthesis, initial encounter: Principal | ICD-10-CM | POA: Diagnosis present

## 2011-10-08 DIAGNOSIS — Y831 Surgical operation with implant of artificial internal device as the cause of abnormal reaction of the patient, or of later complication, without mention of misadventure at the time of the procedure: Secondary | ICD-10-CM | POA: Diagnosis present

## 2011-10-08 DIAGNOSIS — A4901 Methicillin susceptible Staphylococcus aureus infection, unspecified site: Secondary | ICD-10-CM | POA: Diagnosis present

## 2011-10-08 DIAGNOSIS — D62 Acute posthemorrhagic anemia: Secondary | ICD-10-CM | POA: Diagnosis not present

## 2011-10-08 HISTORY — PX: TOTAL HIP REVISION: SHX763

## 2011-10-08 HISTORY — DX: Unspecified osteoarthritis, unspecified site: M19.90

## 2011-10-08 HISTORY — DX: Headache: R51

## 2011-10-08 LAB — BASIC METABOLIC PANEL
Calcium: 9.9 mg/dL (ref 8.4–10.5)
GFR calc Af Amer: >90 mL/min (ref >90)
GFR calc non Af Amer: >90 mL/min (ref >90)
Glucose, Bld: 91 mg/dL (ref 70–99)
Potassium: 4 mEq/L (ref 3.5–5.1)
Sodium: 136 mEq/L (ref 135–145)

## 2011-10-08 LAB — URINALYSIS, ROUTINE W REFLEX MICROSCOPIC
Bilirubin Urine: NEGATIVE
Hgb urine dipstick: NEGATIVE
Ketones, ur: NEGATIVE mg/dL
Specific Gravity, Urine: 1.018 (ref 1.005–1.030)
Urobilinogen, UA: 0.2 mg/dL (ref 0.0–1.0)

## 2011-10-08 LAB — DIFFERENTIAL
Basophils Absolute: 0 10*3/uL (ref 0.0–0.1)
Basophils Relative: 0 % (ref 0–1)
Eosinophils Absolute: 0.1 10*3/uL (ref 0.0–0.7)
Eosinophils Relative: 1 % (ref 0–5)
Neutrophils Relative %: 62 % (ref 43–77)

## 2011-10-08 LAB — CBC
MCH: 27.1 pg (ref 26.0–34.0)
MCHC: 33.3 g/dL (ref 30.0–36.0)
MCV: 81.2 fL (ref 78.0–100.0)
Platelets: 254 10*3/uL (ref 150–400)
RBC: 4.47 MIL/uL (ref 3.87–5.11)
RDW: 13.9 % (ref 11.5–15.5)

## 2011-10-08 LAB — HEMOGLOBIN AND HEMATOCRIT, BLOOD: HCT: 27.8 % — ABNORMAL LOW (ref 36.0–46.0)

## 2011-10-08 LAB — PROTIME-INR
INR: 1.01 (ref 0.00–1.49)
Prothrombin Time: 13.5 seconds (ref 11.6–15.2)

## 2011-10-08 LAB — GRAM STAIN

## 2011-10-08 LAB — BODY FLUID CELL COUNT WITH DIFFERENTIAL

## 2011-10-08 LAB — SURGICAL PCR SCREEN: Staphylococcus aureus: NEGATIVE

## 2011-10-08 LAB — APTT: aPTT: 35 seconds (ref 24–37)

## 2011-10-08 SURGERY — TOTAL HIP REVISION
Anesthesia: Choice | Site: Hip | Laterality: Right

## 2011-10-08 MED ORDER — BISACODYL 5 MG PO TBEC: 5.0000 mg | DELAYED_RELEASE_TABLET | Freq: Every day | ORAL | Status: DC | PRN

## 2011-10-08 MED ORDER — MINERAL OIL LIGHT 100 % EX OIL
TOPICAL_OIL | CUTANEOUS | Status: AC
  Filled 2011-10-08: qty 25

## 2011-10-08 MED ORDER — METHOCARBAMOL 100 MG/ML IJ SOLN
500.0000 mg | Freq: Four times a day (QID) | INTRAVENOUS | Status: DC | PRN
  Administered 2011-10-08: 500 mg via INTRAVENOUS
  Filled 2011-10-08 (×2): qty 5

## 2011-10-08 MED ORDER — ALUM & MAG HYDROXIDE-SIMETH 200-200-20 MG/5ML PO SUSP
30.0000 mL | ORAL | Status: DC | PRN
  Administered 2011-10-11: 30 mL via ORAL
  Filled 2011-10-08: qty 30

## 2011-10-08 MED ORDER — FERROUS SULFATE 325 (65 FE) MG PO TABS
325.0000 mg | ORAL_TABLET | Freq: Three times a day (TID) | ORAL | Status: DC
  Administered 2011-10-09 – 2011-10-13 (×11): 325 mg via ORAL
  Filled 2011-10-08 (×15): qty 1

## 2011-10-08 MED ORDER — TOBRAMYCIN SULFATE 1.2 G IJ SOLR
INTRAMUSCULAR | Status: DC | PRN
  Administered 2011-10-08: 9.6 g

## 2011-10-08 MED ORDER — DULOXETINE HCL 60 MG PO CPEP
60.0000 mg | ORAL_CAPSULE | Freq: Every day | ORAL | Status: DC
  Administered 2011-10-09 – 2011-10-13 (×4): 60 mg via ORAL
  Filled 2011-10-08 (×5): qty 1

## 2011-10-08 MED ORDER — PROPOFOL 10 MG/ML IV BOLUS
INTRAVENOUS | Status: DC | PRN
  Administered 2011-10-08: 200 mg via INTRAVENOUS

## 2011-10-08 MED ORDER — POLYETHYLENE GLYCOL 3350 17 G PO PACK
17.0000 g | PACK | Freq: Two times a day (BID) | ORAL | Status: DC
  Administered 2011-10-08 – 2011-10-13 (×8): 17 g via ORAL
  Filled 2011-10-08 (×11): qty 1

## 2011-10-08 MED ORDER — HYDROCORTISONE ACETATE 25 MG RE SUPP: 25.0000 mg | Freq: Two times a day (BID) | RECTAL | Status: DC | PRN

## 2011-10-08 MED ORDER — NEOSTIGMINE METHYLSULFATE 1 MG/ML IJ SOLN
INTRAMUSCULAR | Status: DC | PRN
  Administered 2011-10-08: 3 mg via INTRAVENOUS

## 2011-10-08 MED ORDER — LACTATED RINGERS IV SOLN: INTRAVENOUS | Status: DC

## 2011-10-08 MED ORDER — HYDROMORPHONE HCL PF 1 MG/ML IJ SOLN
INTRAMUSCULAR | Status: DC | PRN
  Administered 2011-10-08 (×4): 0.5 mg via INTRAVENOUS

## 2011-10-08 MED ORDER — PROMETHAZINE HCL 25 MG/ML IJ SOLN: 6.2500 mg | INTRAMUSCULAR | Status: DC | PRN

## 2011-10-08 MED ORDER — ONDANSETRON HCL 4 MG PO TABS: 4.0000 mg | ORAL_TABLET | Freq: Four times a day (QID) | ORAL | Status: DC | PRN

## 2011-10-08 MED ORDER — METOCLOPRAMIDE HCL 10 MG PO TABS: 5.0000 mg | ORAL_TABLET | Freq: Three times a day (TID) | ORAL | Status: DC | PRN

## 2011-10-08 MED ORDER — CHLORHEXIDINE GLUCONATE 4 % EX LIQD: 60.0000 mL | Freq: Once | CUTANEOUS | Status: DC

## 2011-10-08 MED ORDER — VANCOMYCIN HCL 1000 MG IV SOLR
INTRAVENOUS | Status: DC | PRN
  Administered 2011-10-08: 2 g

## 2011-10-08 MED ORDER — ACETAMINOPHEN 10 MG/ML IV SOLN
INTRAVENOUS | Status: AC
  Filled 2011-10-08: qty 100

## 2011-10-08 MED ORDER — TOBRAMYCIN SULFATE 1.2 G IJ SOLR
INTRAMUSCULAR | Status: AC
  Filled 2011-10-08: qty 2.4

## 2011-10-08 MED ORDER — ONDANSETRON HCL 4 MG/2ML IJ SOLN
4.0000 mg | Freq: Four times a day (QID) | INTRAMUSCULAR | Status: DC | PRN
  Administered 2011-10-11 – 2011-10-12 (×3): 4 mg via INTRAVENOUS
  Filled 2011-10-08 (×3): qty 2

## 2011-10-08 MED ORDER — DOCUSATE SODIUM 100 MG PO CAPS
100.0000 mg | ORAL_CAPSULE | Freq: Two times a day (BID) | ORAL | Status: DC
  Administered 2011-10-08 – 2011-10-13 (×9): 100 mg via ORAL
  Filled 2011-10-08 (×11): qty 1

## 2011-10-08 MED ORDER — ACETAMINOPHEN 650 MG RE SUPP: 650.0000 mg | Freq: Four times a day (QID) | RECTAL | Status: DC | PRN

## 2011-10-08 MED ORDER — MIDAZOLAM HCL 5 MG/5ML IJ SOLN
INTRAMUSCULAR | Status: DC | PRN
  Administered 2011-10-08: 2 mg via INTRAVENOUS

## 2011-10-08 MED ORDER — FENTANYL CITRATE 0.05 MG/ML IJ SOLN
INTRAMUSCULAR | Status: DC | PRN
  Administered 2011-10-08: 50 ug via INTRAVENOUS
  Administered 2011-10-08 (×3): 100 ug via INTRAVENOUS
  Administered 2011-10-08: 50 ug via INTRAVENOUS
  Administered 2011-10-08: 100 ug via INTRAVENOUS

## 2011-10-08 MED ORDER — ONDANSETRON HCL 4 MG/2ML IJ SOLN
INTRAMUSCULAR | Status: DC | PRN
  Administered 2011-10-08: 4 mg via INTRAVENOUS

## 2011-10-08 MED ORDER — CEFAZOLIN SODIUM-DEXTROSE 2-3 GM-% IV SOLR
INTRAVENOUS | Status: AC
  Filled 2011-10-08: qty 50

## 2011-10-08 MED ORDER — HYDROCODONE-ACETAMINOPHEN 7.5-325 MG PO TABS
1.0000 | ORAL_TABLET | ORAL | Status: DC
  Administered 2011-10-08 – 2011-10-09 (×2): 1 via ORAL
  Administered 2011-10-09 (×3): 2 via ORAL
  Administered 2011-10-09: 1 via ORAL
  Administered 2011-10-10: 2 via ORAL
  Administered 2011-10-10 (×2): 1 via ORAL
  Administered 2011-10-11 (×2): 2 via ORAL
  Filled 2011-10-08 (×2): qty 1
  Filled 2011-10-08: qty 2
  Filled 2011-10-08 (×3): qty 1
  Filled 2011-10-08: qty 2
  Filled 2011-10-08: qty 1
  Filled 2011-10-08 (×4): qty 2
  Filled 2011-10-08: qty 1

## 2011-10-08 MED ORDER — VANCOMYCIN HCL IN DEXTROSE 1-5 GM/200ML-% IV SOLN
1000.0000 mg | Freq: Two times a day (BID) | INTRAVENOUS | Status: AC
  Administered 2011-10-09: 1000 mg via INTRAVENOUS
  Filled 2011-10-08: qty 200

## 2011-10-08 MED ORDER — ROCURONIUM BROMIDE 100 MG/10ML IV SOLN
INTRAVENOUS | Status: DC | PRN
  Administered 2011-10-08: 50 mg via INTRAVENOUS

## 2011-10-08 MED ORDER — ACETAMINOPHEN 10 MG/ML IV SOLN
INTRAVENOUS | Status: DC | PRN
  Administered 2011-10-08: 1000 mg via INTRAVENOUS

## 2011-10-08 MED ORDER — DIPHENHYDRAMINE HCL 25 MG PO CAPS: 25.0000 mg | ORAL_CAPSULE | Freq: Four times a day (QID) | ORAL | Status: DC | PRN

## 2011-10-08 MED ORDER — ACETAMINOPHEN 325 MG PO TABS: 650.0000 mg | ORAL_TABLET | Freq: Four times a day (QID) | ORAL | Status: DC | PRN

## 2011-10-08 MED ORDER — CEFAZOLIN SODIUM 1-5 GM-% IV SOLN
1.0000 g | INTRAVENOUS | Status: AC
  Administered 2011-10-08: 2 g via INTRAVENOUS

## 2011-10-08 MED ORDER — FLEET ENEMA 7-19 GM/118ML RE ENEM: 1.0000 | ENEMA | Freq: Once | RECTAL | Status: AC | PRN

## 2011-10-08 MED ORDER — HETASTARCH-ELECTROLYTES 6 % IV SOLN
INTRAVENOUS | Status: DC | PRN
  Administered 2011-10-08: 18:00:00 via INTRAVENOUS

## 2011-10-08 MED ORDER — HYDROMORPHONE HCL PF 1 MG/ML IJ SOLN
0.2500 mg | INTRAMUSCULAR | Status: DC | PRN
  Administered 2011-10-08 (×4): 0.5 mg via INTRAVENOUS

## 2011-10-08 MED ORDER — ZOLPIDEM TARTRATE 5 MG PO TABS: 5.0000 mg | ORAL_TABLET | Freq: Every evening | ORAL | Status: DC | PRN

## 2011-10-08 MED ORDER — LACTATED RINGERS IV SOLN
INTRAVENOUS | Status: DC
  Administered 2011-10-08: 1000 mL via INTRAVENOUS
  Administered 2011-10-08 (×2): via INTRAVENOUS

## 2011-10-08 MED ORDER — GLYCOPYRROLATE 0.2 MG/ML IJ SOLN
INTRAMUSCULAR | Status: DC | PRN
  Administered 2011-10-08: .4 mg via INTRAVENOUS

## 2011-10-08 MED ORDER — METHOCARBAMOL 500 MG PO TABS
500.0000 mg | ORAL_TABLET | Freq: Four times a day (QID) | ORAL | Status: DC | PRN
  Administered 2011-10-09 – 2011-10-11 (×7): 500 mg via ORAL
  Filled 2011-10-08 (×7): qty 1

## 2011-10-08 MED ORDER — PHENOL 1.4 % MT LIQD: 1.0000 | OROMUCOSAL | Status: DC | PRN

## 2011-10-08 MED ORDER — HYDROMORPHONE HCL PF 1 MG/ML IJ SOLN
INTRAMUSCULAR | Status: AC
  Filled 2011-10-08: qty 1

## 2011-10-08 MED ORDER — RIVAROXABAN 10 MG PO TABS
10.0000 mg | ORAL_TABLET | ORAL | Status: DC
  Administered 2011-10-09 – 2011-10-13 (×4): 10 mg via ORAL
  Filled 2011-10-08 (×5): qty 1

## 2011-10-08 MED ORDER — LIDOCAINE HCL (CARDIAC) 20 MG/ML IV SOLN
INTRAVENOUS | Status: DC | PRN
  Administered 2011-10-08: 80 mg via INTRAVENOUS

## 2011-10-08 MED ORDER — METOCLOPRAMIDE HCL 5 MG/ML IJ SOLN
5.0000 mg | Freq: Three times a day (TID) | INTRAMUSCULAR | Status: DC | PRN
  Administered 2011-10-11: 10 mg via INTRAVENOUS
  Filled 2011-10-08: qty 2

## 2011-10-08 MED ORDER — LACTATED RINGERS IV SOLN
INTRAVENOUS | Status: DC | PRN
  Administered 2011-10-08: 20:00:00 via INTRAVENOUS

## 2011-10-08 MED ORDER — MENTHOL 3 MG MT LOZG: 1.0000 | LOZENGE | OROMUCOSAL | Status: DC | PRN

## 2011-10-08 MED ORDER — SODIUM CHLORIDE 0.9 % IV SOLN
100.0000 mL/h | INTRAVENOUS | Status: DC
  Administered 2011-10-08 – 2011-10-09 (×2): 100 mL/h via INTRAVENOUS
  Filled 2011-10-08 (×16): qty 1000

## 2011-10-08 MED ORDER — DEXAMETHASONE SODIUM PHOSPHATE 10 MG/ML IJ SOLN
10.0000 mg | Freq: Once | INTRAMUSCULAR | Status: AC
  Administered 2011-10-08: 10 mg via INTRAVENOUS
  Filled 2011-10-08: qty 1

## 2011-10-08 MED ORDER — HYDROMORPHONE HCL PF 2 MG/ML IJ SOLN: 0.5000 mg | INTRAMUSCULAR | Status: DC | PRN

## 2011-10-08 MED ORDER — VANCOMYCIN HCL 1000 MG IV SOLR
1500.0000 mg | Freq: Once | INTRAVENOUS | Status: AC
  Administered 2011-10-08: 1500 mg via INTRAVENOUS
  Filled 2011-10-08: qty 1500

## 2011-10-08 MED FILL — Vancomycin HCl For IV Soln 1 GM (Base Equivalent): INTRAVENOUS | Qty: 8000 | Status: AC

## 2011-10-08 MED FILL — Tobramycin Sulfate For Inj 1.2 GM: INTRAMUSCULAR | Qty: 9.6 | Status: AC

## 2011-10-08 SURGICAL SUPPLY — 57 items
BAG SPEC THK2 15X12 ZIP CLS (MISCELLANEOUS) ×1
BAG ZIPLOCK 12X15 (MISCELLANEOUS) ×2 IMPLANT
BLADE SAW SGTL 18X1.27X75 (BLADE) ×2 IMPLANT
BRUSH FEMORAL CANAL (MISCELLANEOUS) ×2 IMPLANT
CEMENT HV SMART SET (Cement) ×7 IMPLANT
CLOTH BEACON ORANGE TIMEOUT ST (SAFETY) ×2 IMPLANT
DRAPE INCISE IOBAN 85X60 (DRAPES) ×2 IMPLANT
DRAPE ORTHO SPLIT 77X108 STRL (DRAPES) ×4
DRAPE POUCH INSTRU U-SHP 10X18 (DRAPES) ×2 IMPLANT
DRAPE SURG 17X11 SM STRL (DRAPES) ×2 IMPLANT
DRAPE SURG ORHT 6 SPLT 77X108 (DRAPES) ×2 IMPLANT
DRAPE U-SHAPE 47X51 STRL (DRAPES) ×2 IMPLANT
DRSG EMULSION OIL 3X16 NADH (GAUZE/BANDAGES/DRESSINGS) ×2 IMPLANT
DRSG MEPILEX BORDER 4X12 (GAUZE/BANDAGES/DRESSINGS) ×1 IMPLANT
DRSG MEPILEX BORDER 4X4 (GAUZE/BANDAGES/DRESSINGS) ×2 IMPLANT
DRSG MEPILEX BORDER 4X8 (GAUZE/BANDAGES/DRESSINGS) ×2 IMPLANT
DURAPREP 26ML APPLICATOR (WOUND CARE) ×2 IMPLANT
ELECT BLADE TIP CTD 4 INCH (ELECTRODE) ×2 IMPLANT
ELECT REM PT RETURN 9FT ADLT (ELECTROSURGICAL) ×2
ELECTRODE REM PT RTRN 9FT ADLT (ELECTROSURGICAL) ×1 IMPLANT
EVACUATOR 1/8 PVC DRAIN (DRAIN) ×2 IMPLANT
FACESHIELD LNG OPTICON STERILE (SAFETY) ×8 IMPLANT
GAUZE SPONGE 4X4 12PLY STRL LF (GAUZE/BANDAGES/DRESSINGS) ×1 IMPLANT
GAUZE XEROFORM 5X9 LF (GAUZE/BANDAGES/DRESSINGS) ×1 IMPLANT
GLOVE BIOGEL PI IND STRL 7.5 (GLOVE) ×1 IMPLANT
GLOVE BIOGEL PI IND STRL 8 (GLOVE) ×1 IMPLANT
GLOVE BIOGEL PI INDICATOR 7.5 (GLOVE) ×1
GLOVE BIOGEL PI INDICATOR 8 (GLOVE) ×1
GLOVE ORTHO TXT STRL SZ7.5 (GLOVE) ×4 IMPLANT
GLOVE SURG ORTHO 8.0 STRL STRW (GLOVE) ×2 IMPLANT
GOWN STRL NON-REIN LRG LVL3 (GOWN DISPOSABLE) ×2 IMPLANT
HANDPIECE INTERPULSE COAX TIP (DISPOSABLE) ×2
HEAD FEM STD 32X+5 STRL (Hips) ×1 IMPLANT
IMMOBILIZER KNEE 20 (SOFTGOODS) ×2
IMMOBILIZER KNEE 20 THIGH 36 (SOFTGOODS) IMPLANT
KIT BASIN OR (CUSTOM PROCEDURE TRAY) ×2 IMPLANT
KIT STIMULAN RAPID CURE  10CC (Orthopedic Implant) ×1 IMPLANT
KIT STIMULAN RAPID CURE 10CC (Orthopedic Implant) IMPLANT
LINER ACET CUP 42MMX32MM (Hips) ×1 IMPLANT
MANIFOLD NEPTUNE II (INSTRUMENTS) ×2 IMPLANT
NS IRRIG 1000ML POUR BTL (IV SOLUTION) ×4 IMPLANT
PACK TOTAL JOINT (CUSTOM PROCEDURE TRAY) ×2 IMPLANT
POSITIONER SURGICAL ARM (MISCELLANEOUS) ×2 IMPLANT
PRESSURIZER FEMORAL UNIV (MISCELLANEOUS) ×2 IMPLANT
SET HNDPC FAN SPRY TIP SCT (DISPOSABLE) ×1 IMPLANT
SPONGE LAP 18X18 X RAY DECT (DISPOSABLE) ×2 IMPLANT
SPONGE LAP 4X18 X RAY DECT (DISPOSABLE) ×2 IMPLANT
STAPLER VISISTAT 35W (STAPLE) ×2 IMPLANT
STEM HIGH OFFSET SIZE 1 (Stem) ×1 IMPLANT
SUCTION FRAZIER TIP 10 FR DISP (SUCTIONS) ×2 IMPLANT
SUT VIC AB 1 CT1 36 (SUTURE) ×6 IMPLANT
SUT VIC AB 2-0 CT1 27 (SUTURE) ×6
SUT VIC AB 2-0 CT1 TAPERPNT 27 (SUTURE) ×3 IMPLANT
TOWEL OR 17X26 10 PK STRL BLUE (TOWEL DISPOSABLE) ×4 IMPLANT
TOWER CARTRIDGE SMART MIX (DISPOSABLE) ×2 IMPLANT
TRAY FOLEY CATH 14FRSI W/METER (CATHETERS) ×2 IMPLANT
WATER STERILE IRR 1500ML POUR (IV SOLUTION) ×2 IMPLANT

## 2011-10-08 NOTE — Interval H&P Note (Signed)
History and Physical Interval Note:  10/08/2011 7:03 AM  [name]  has presented today for surgery, with the diagnosis of Infected Right Total Hip  The various methods of treatment have been discussed with the patient and family. After consideration of risks, benefits and other options for treatment, the patient has consented to  Procedure(s): Right TOTAL HIP RESECTION with placement of antibiotic spacer as a surgical intervention .  The patients' history has been reviewed, patient examined, no change in status, stable for surgery.  I have reviewed the patients' chart and labs.  Questions were answered to the patient's satisfaction.     [name]

## 2011-10-08 NOTE — Anesthesia Postprocedure Evaluation (Signed)
  Anesthesia Post-op Note  Patient: [name]  Procedure(s) Performed:  TOTAL HIP REVISION - Resection of Right Total Hip/Extended Trochanteric Osteotomy/Placement of Antibiotic Total Hip Cemented by Depuy  Patient Location: PACU  Anesthesia Type: General  Level of Consciousness: oriented and sedated  Airway and Oxygen Therapy: Patient Spontanous Breathing and Patient connected to nasal cannula oxygen  Post-op Pain: mild  Post-op Assessment: Post-op Vital signs reviewed, Patient's Cardiovascular Status Stable, Respiratory Function Stable and Patent Airway  Post-op Vital Signs: stable  Complications: No apparent anesthesia complications

## 2011-10-08 NOTE — Transfer of Care (Signed)
Immediate Anesthesia Transfer of Care Note  Patient: [name]  Procedure(s) Performed:  TOTAL HIP REVISION - Resection of Right Total Hip/Extended Trochanteric Osteotomy/Placement of Antibiotic Total Hip Cemented by Depuy  Patient Location: PACU  Anesthesia Type: General  Level of Consciousness: awake, alert  and oriented  Airway & Oxygen Therapy: Patient Spontanous Breathing and Patient connected to face mask oxygen  Post-op Assessment: Report given to PACU RN and Post -op Vital signs reviewed and stable  Post vital signs: Reviewed and stable  Complications: No apparent anesthesia complications

## 2011-10-08 NOTE — Op Note (Signed)
[name], LALLI NO.:  [id number]  MEDICAL RECORD NO.:  [id number]  LOCATION:  [facility]                         FACILITY:  [facility]  PHYSICIAN:  Madlyn Frankel. Charlann Boxer, M.D.  DATE OF BIRTH:  1956/05/09  DATE OF PROCEDURE:  10/08/2011 DATE OF DISCHARGE:                              OPERATIVE REPORT   PREOPERATIVE DIAGNOSIS:  Infected right total hip replacement.  POSTOPERATIVE DIAGNOSIS:  Infected right total hip replacement.  PROCEDURE:  Resection of an infected right total hip replacement with placement of an antibiotic, total hip with supplemented antibiotic laden B and calcium beads.  FINDINGS:  The patient's femoral component was noted to have gotten loose.  There was evidence of somewhat purulent looking fluid.  This is a little bit complicated due to initial oozing at presentation, made difficult to ascertain the actual clarity to the fluid in the hip joint region.  However, her tissues in the surrounding area appeared to have more of an infected type of appearance.  SURGEON:  Madlyn Frankel. Charlann Boxer, M.D.  ASSISTANT:  Lanney Gins, PA.  ANESTHESIA:  General.  SPECIMENS:  Joint fluid and tissue from around the joint were sent.  DRAINS:  One medium Hemovac.  BLOOD LOSS:  Probably about 1 L.  COMPLICATIONS:  None apparent.  INDICATION OF THE PROCEDURE:  Ms. Taff is a 55 year old patient of mine, who had previously had a revision total hip replacement by myself from an outside physician.  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  She was treated aggressively with IV Cubicin and then suppressive antibiotics. Unfortunately has failed to provide adequate relief for and she is presented recently with progressive worsening pain in the hip joint, but not septic.  No fevers, chills, night sweats.  Reviewed with her the risks and benefits and  necessity of the procedure.  Talked about the breast and idea of a Prostalac hip system to allow for increased functionality as well as antibiotic treatment for 6 weeks with reimplantation followup. Consent was obtained for the benefit of healing the infection as well as long-term management in this 55 year old female.  PROCEDURE IN DETAIL:  The patient was brought to operative theater. Once adequate anesthesia, preoperative antibiotics, Ancef initially followed by vancomycin 1.5 g administered.  She was positioned into the left lateral decubitus position with the right side up.  The right lower extremity was then prescrubbed, prepped, and draped in sterile fashion. A time-out was performed identifying the patient, planned procedure, and extremity.  The patient's old incision was utilized.  I extended a bit proximal and distal to allow for soft tissue exposure.  Soft tissue planes were created down to the iliotibial band and gluteal fascia.  Then incised this posteriorly for posterior approach.  As I swept away the pseudocapsule off the posterior aspect of femur, we encountered the joint fluid, but as noted, there is significant amount of oozing from the soft tissues in the scar in this area, making it difficult for me to ascertain the exact clarity of the fluid at the time of presentation. Nonetheless, the culture  swabs were taken.  I then also sent off a fluid sample by aspirating for cell count Gram stain culture.  Following this, further dissection of the posterior aspect the hip was carried out.  The hip was then dislocated.  Femoral head was removed. At this point, identified the femoral stem was loose.  Further exposure of the proximal femur, I was able to place the S-ROM loop extractor around the neck of the prosthesis and was able to dislodge the femoral stem without bone loss or complication.  At this point, I curetted out the proximal femur and sent this tissue for  culture analysis as well.  The femur was then retracted anteriorly.  Retractors for the acetabular were placed.  Following debridement of the pseudocapsule around the hip joint, I removed the acetabular liner, removed the single acetabular screw.  Replaced the liner, then used the hip cup extractor system to remove the 50 mm pinnacle shell.  The cup came out without significant bone loss.  I curetted out a couple areas of cyst in the acetabulum.  At this point, 3 L normal saline solution was pulse lavaged into the hip joint, the acetabular area, and soft tissues.  Following this, another 3 L was used into the femoral canal.  At the time this is being done, we mixed up 1 batch of cement with 2 g vancomycin, 2.4 g of tobramycin.  This was mixed and held for the acetabular component.  In addition, we mixed 2 batches of cement with 4 g of vancomycin and 4.8 g of tobramycin for the femoral stem Prostalac.  Once the acetabular cement was ready, the 42 x 32 mm acetabular shell was then placed into the acetabulum with a ball of cement and held with a hip guide positioner to approximate 20 degrees of forward flexion, 35 to 40 degrees of abduction.  Once the cement had partially cured, we had already in the back table placed the small high offset cemented stem into the cement filled Prostalac mold.  Once the cement had cured, the stem was extracted from the mold.  I then mixed 1 other batch of cement with 1 g vancomycin and 1.2 of tobramycin and used the cement just proximally at the femoral component. The femoral component was cemented into position, and the proximal femur with a good fit.  Anteversion was approximately 25 degrees. While the cement cured, I reduced the hip into the acetabulum with a trial of 32 + 1 ball.  I had previously removed the 32 + 9 ball.  Once the cement fully cured, I trialled and chose a 32 + 5 final ball.  This was impacted onto clean and dry trunnion.  I then  irrigated the hip again with another 3 L normal saline solution.  At this point, we on the back table made stimulant calcium beads mixed with antibiotics as a further delivery system for cement elusion into the wound.  These were mixed with approximately 500 mg of vancomycin and 500 of tobramycin with 10 mL of this similar material.  The small beads were processed and then positioned into the acetabular region. Once these were done, the iliotibial band and gluteal fascia were reapproximated using #1 PDS.  A medium Hemovac drain have been placed deep.  The remainder wound at this point was closed with 2-0 Vicryl, and staples on the skin.  The skin was cleaned, dried, and dressed sterilely using Xeroform and Mepilex dressing.  The drain site was dressed separately.  She  was then brought to recovery room and extubated in stable condition tolerating the procedure well.     Madlyn Frankel Charlann Boxer, M.D.     MDO/MEDQ  D:  10/08/2011  T:  10/08/2011  Job:  [id number]

## 2011-10-08 NOTE — Anesthesia Preprocedure Evaluation (Addendum)
Anesthesia Evaluation  Patient identified by MRN, date of birth, ID band Patient awake    Reviewed: Allergy & Precautions, H&P , NPO status , Patient's Chart, lab work & pertinent test results, reviewed documented beta blocker date and time   Airway Mallampati: II TM Distance: >3 FB Neck ROM: Full    Dental   Pulmonary neg pulmonary ROS,  clear to auscultation        Cardiovascular neg cardio ROS Regular Normal Denies cardiac symptoms   Neuro/Psych Negative Neurological ROS  Negative Psych ROS   GI/Hepatic negative GI ROS, Neg liver ROS,   Endo/Other  Obesity  Renal/GU negative Renal ROS  Genitourinary negative   Musculoskeletal negative musculoskeletal ROS (+)   Abdominal   Peds negative pediatric ROS (+)  Hematology negative hematology ROS (+)   Anesthesia Other Findings   Reproductive/Obstetrics negative OB ROS                          Anesthesia Physical Anesthesia Plan  ASA: I  Anesthesia Plan: General   Post-op Pain Management:    Induction: Intravenous  Airway Management Planned: Oral ETT  Additional Equipment:   Intra-op Plan:   Post-operative Plan: Extubation in OR  Informed Consent: I have reviewed the patients History and Physical, chart, labs and discussed the procedure including the risks, benefits and alternatives for the proposed anesthesia with the patient or authorized representative who has indicated his/her understanding and acceptance.     Plan Discussed with: CRNA and Surgeon  Anesthesia Plan Comments:         Anesthesia Quick Evaluation

## 2011-10-08 NOTE — Brief Op Note (Signed)
10/08/2011  8:16 PM  PATIENT:  [name]  55 y.o. female  PRE-OPERATIVE DIAGNOSIS:  Infected Right Total Hip  POST-OPERATIVE DIAGNOSIS:  Infected Right Total Hip  PROCEDURE:  Procedure(s): Resection of infected right total hip hip replacement, placement antibiotic total hip with antibiotic beads TOTAL HIP REVISION  SURGEON:  Surgeon(s): [name]  PHYSICIAN ASSISTANT: Lanney Gins, PA-C   ANESTHESIA:   general  EBL:  Total I/O In: 1000 [I.V.:1000] Out: 275 [Urine:75; Blood:200]  BLOOD ADMINISTERED:none  DRAINS: (1 medium) Hemovact drain(s) in the right hip with  Suction Open   LOCAL MEDICATIONS USED:  NONE  SPECIMEN:  Source of Specimen:  right hip fluid and tissue  DISPOSITION OF SPECIMEN:  microbiology  COUNTS:  YES  TOURNIQUET:  * No tourniquets in log *  DICTATION: .Other Dictation: Dictation Number [id number]  PLAN OF CARE: Admit to inpatient   PATIENT DISPOSITION:  PACU - hemodynamically stable.   Delay start of Pharmacological VTE agent (>24hrs) due to surgical blood loss or risk of bleeding:  {YES/NO/NOT APPLICABLE:20182

## 2011-10-09 ENCOUNTER — Other Ambulatory Visit: Payer: Self-pay

## 2011-10-09 LAB — BASIC METABOLIC PANEL
Calcium: 8.7 mg/dL (ref 8.4–10.5)
GFR calc Af Amer: >90 mL/min (ref >90)
GFR calc non Af Amer: >90 mL/min (ref >90)
Glucose, Bld: 169 mg/dL — ABNORMAL HIGH (ref 70–99)
Potassium: 4.4 mEq/L (ref 3.5–5.1)
Sodium: 131 mEq/L — ABNORMAL LOW (ref 135–145)

## 2011-10-09 LAB — CBC
Hemoglobin: 8.8 g/dL — ABNORMAL LOW (ref 12.0–15.0)
MCH: 26.8 pg (ref 26.0–34.0)
MCHC: 32.8 g/dL (ref 30.0–36.0)
Platelets: 232 10*3/uL (ref 150–400)
RDW: 13.9 % (ref 11.5–15.5)

## 2011-10-09 MED ORDER — SODIUM CHLORIDE 0.9 % IV BOLUS (SEPSIS)
500.0000 mL | Freq: Once | INTRAVENOUS | Status: AC
  Administered 2011-10-09: 500 mL via INTRAVENOUS

## 2011-10-09 NOTE — Progress Notes (Signed)
Physical Therapy Evaluation Patient Details Name: GUNHILD BAUTCH MRN: [id number] DOB: 08-11-1956 Today's Date: 10/09/2011 1140-1210 ev2 Problem List:  Patient Active Problem List  Diagnoses  . Prosthetic hip implant failure  . Septic hip  . MSSA (methicillin susceptible Staphylococcus aureus) septicemia  . Carpal tunnel syndrome of right wrist  . History of TTP (thrombotic thrombocytopenic purpura)  . S/P revision of right total hip    Past Medical History:  Past Medical History  Diagnosis Date  . MSSA (methicillin susceptible Staphylococcus aureus) infection   . Varicose vein   . T.T.P. syndrome     20 yrs ago-not seen anyone for 10 yrs.  . Headache     migraines  . Arthritis     right hip   Past Surgical History:  Past Surgical History  Procedure Date  . Revision total hip arthroplasty   . Tonsillectomy 1963  . Joint replacement 08/2009    right hip replacement    PT Assessment/Plan/Recommendation PT Assessment Clinical Impression Statement: pt s/p resection of RTHA with antibiatic spacer/beads. pt can benefit from PT to improve functional mobility, strength and endurance to dc home PT Recommendation/Assessment: Patient will need skilled PT in the acute care venue PT Problem List: Decreased strength;Decreased activity tolerance;Decreased range of motion;Decreased knowledge of use of DME;Decreased knowledge of precautions;Cardiopulmonary status limiting activity PT Therapy Diagnosis : Difficulty walking;Generalized weakness PT Plan PT Frequency: 7X/week PT Treatment/Interventions: DME instruction;Gait training;Functional mobility training;Therapeutic activities;Patient/family education PT Recommendation Recommendations for Other Services: OT consult Follow Up Recommendations: Home health PT Equipment Recommended: 3 in 1 bedside comode PT Goals  Acute Rehab PT Goals PT Goal Formulation: With patient Time For Goal Achievement: 7 days Pt will go Supine/Side to Sit:  with supervision;with HOB 0 degrees PT Goal: Supine/Side to Sit - Progress: Not met Pt will go Sit to Supine/Side: with supervision PT Goal: Sit to Supine/Side - Progress: Not met Pt will go Sit to Stand: with supervision PT Goal: Sit to Stand - Progress: Not met Pt will go Stand to Sit: with supervision PT Goal: Stand to Sit - Progress: Not met Pt will Ambulate: 51 - 150 feet;with supervision;with rolling walker PT Goal: Ambulate - Progress: Not met  PT Evaluation Precautions/Restrictions  Precautions Precautions: Posterior Hip Required Braces or Orthoses: Yes Knee Immobilizer: On at all times (until cleared by DR. Charlann Boxer that it can be removed) Restrictions Weight Bearing Restrictions: Yes RLE Weight Bearing: Partial weight bearing RLE Partial Weight Bearing Percentage or Pounds: 50% Prior Functioning  Home Living Lives With: Spouse Receives Help From: Family Type of Home: House Home Layout: One level Home Access: Ramped entrance Bathroom Accessibility: Yes How Accessible: Accessible via walker Home Adaptive Equipment: Walker - rolling;Wheelchair - manual;Long-handled sponge;Long-handled shoehorn;Raised toilet seat with rails Prior Function Level of Independence: Requires assistive device for independence Able to Take Stairs?: Yes Vocation: Full time employment Cognition Cognition Arousal/Alertness: Awake/alert Overall Cognitive Status: Appears within functional limits for tasks assessed Orientation Level: Oriented X4 Sensation/Coordination Sensation Light Touch: Appears Intact Extremity Assessment RUE Assessment RUE Assessment: Within Functional Limits LUE Assessment LUE Assessment: Within Functional Limits RLE Assessment RLE Assessment: Exceptions to [facility] RLE AROM (degrees) RLE Overall AROM Comments: KI in tact, hip flexion to 70 when sitting RLE Strength RLE Overall Strength Comments: requires assistto move RLE LLE Assessment LLE Assessment: Within Functional  Limits Mobility (including Balance) Bed Mobility Bed Mobility: Yes Supine to Sit: 3: Mod assist;With rails Supine to Sit Details (indicate cue type and reason): required assist for  moving RLE to eOB Transfers Transfers: Yes Sit to Stand: 1: +2 Total assist;With upper extremity assist;From bed;From elevated surface (pt=70) Stand to Sit: 3: Mod assist;With armrests;To chair/3-in-1 Stand to Sit Details: assist  for RLE support Ambulation/Gait Ambulation/Gait: Yes Ambulation/Gait Assistance: 1: +2 Total assist;Patient percentage (comment) (pt=70) Ambulation/Gait Assistance Details (indicate cue type and reason):  vc for PWB Ambulation Distance (Feet): 12 Feet Assistive device: Standard walker Gait Pattern: Step-to pattern Gait velocity: slow  Posture/Postural Control Posture/Postural Control: No significant limitations Exercise    End of Session PT - End of Session Equipment Utilized During Treatment: Right knee immobilizer Activity Tolerance: Patient tolerated treatment well;Patient limited by fatigue (nausea) Patient left: in chair;with call bell in reach Nurse Communication: Mobility status for transfers General Behavior During Session: [facility] for tasks performed Cognition: [facility] for tasks performed  Rada Hay 10/09/2011, 4:52 PM

## 2011-10-09 NOTE — Progress Notes (Signed)
Utilization review completed.  

## 2011-10-09 NOTE — Progress Notes (Signed)
Subjective: 1 Day Post-Op Procedure(s) (LRB): TOTAL HIP REVISION (Right)   Patient reports pain as mild. No events.  Objective:   VITALS:   Filed Vitals:   10/09/11 0600  BP: 111/75  Pulse: 90  Temp: 97.2 F (36.2 C)  Resp: 16    Neurovascular intact Dorsiflexion/Plantar flexion intact Incision: dressing C/D/I No cellulitis present Compartment soft  LABS  Basename 10/09/11 0424 10/08/11 2050 10/08/11 1946 10/08/11 1530  HGB 8.8* 9.1* 8.8* --  HCT 26.8* 27.8* 26.0* --  WBC 10.2 -- -- 6.7  PLT 232 -- -- 254     Basename 10/09/11 0424 10/08/11 1946 10/08/11 1530  NA 131* 137 136  K 4.4 3.8 4.0  BUN 11 -- 12  CREATININE 0.59 -- 0.64  GLUCOSE 169* 150* 91     Assessment/Plan: 1 Day Post-Op Procedure(s) (LRB): TOTAL HIP REVISION (Right)  D/C'ed foley D/C'ed HV drain Up with therapy - 50% WB on the right leg Advance diet D/C IV fluids Discharge home with home health or rehab, prefers home if able. to make determination.   Anastasio Auerbach Naveh Rickles   PAC  10/09/2011, 8:26 AM

## 2011-10-09 NOTE — Progress Notes (Signed)
CSW consulted for possible SNF placement. Pt plans to return home with [facility] services. CSW is available to assist with D/C planning if plan changes and SNF is required. RNCM is following pt at this time.

## 2011-10-09 NOTE — Progress Notes (Signed)
Physical Therapy Treatment Patient Details Name: [name] MRN: [id number] DOB: 06/12/1956 Today's Date: 10/09/2011 1530-1600 gsc PT Assessment/Plan  PT - Assessment/Plan Comments on Treatment Session: pt is very motivated by limited by increase HR uo to 163.  RN aware, Pt moves very well .  Dr Charlann Boxer has been notified re: HR , He also stated that the KI is not necessary   PT Plan: Discharge plan remains appropriate;Frequency remains appropriate PT Frequency: 7X/week Follow Up Recommendations: Home health PT Equipment Recommended: 3 in 1 bedside comode PT Goals  Acute Rehab PT Goals PT Goal Formulation: With patient Time For Goal Achievement: 7 days Pt will go Supine/Side to Sit: with supervision;with HOB 0 degrees PT Goal: Supine/Side to Sit - Progress: Progressing toward goal Pt will go Sit to Supine/Side: with supervision PT Goal: Sit to Supine/Side - Progress: Progressing toward goal Pt will go Sit to Stand: with supervision PT Goal: Sit to Stand - Progress: Progressing toward goal Pt will go Stand to Sit: with supervision PT Goal: Stand to Sit - Progress: Progressing toward goal Pt will Ambulate: 51 - 150 feet;with supervision;with rolling walker PT Goal: Ambulate - Progress: Progressing toward goal  PT Treatment Precautions/Restrictions  Precautions Precautions: Posterior Hip Required Braces or Orthoses: Yes Knee Immobilizer: On at all times (until cleared by Dr. Charlann Boxer)  Dr. Charlann Boxer has stated that KI is not indicated and can be removedRestrictions Weight Bearing Restrictions: Yes RLE Weight Bearing: Partial weight bearing RLE Partial Weight Bearing Percentage or Pounds: 50% Mobility (including Balance) Bed Mobility Bed Mobility: Yes Supine to Sit: 3: Mod assist;With rails Supine to Sit Details (indicate cue type and reason): required assist for moving RLE to eOB Sit to Supine - Right: 3: Mod assist;HOB flat Sit to Supine - Right Details (indicate cue type and reason):  assist for RLE onto bed Transfers Transfers: Yes Sit to Stand: 1: +2 Total assist;From chair/3-in-1 Stand to Sit: 4: Min assist;To bed;With upper extremity assist Stand to Sit Details: assist  for RLE support Ambulation/Gait Ambulation/Gait: Yes Ambulation/Gait Assistance: 1: +2 Total assist;Patient percentage (comment) (pt=70) Ambulation/Gait Assistance Details (indicate cue type and reason): pt's HR increased to 165 after 5 feet so pt walked backward to bed.  RN present  to palpate HR. Ambulation Distance (Feet): 5 Feet Assistive device: Rolling walker Gait Pattern: Step-to pattern Gait velocity: slow  Posture/Postural Control Posture/Postural Control: No significant limitations Exercise    End of Session PT - End of Session Equipment Utilized During Treatment: Right knee immobilizer Activity Tolerance: Patient limited by fatigue (pt limited by increased HR) Patient left: in bed;with call bell in reach;with family/visitor present Nurse Communication: Mobility status for transfers (increase in HR) General Behavior During Session: [facility] for tasks performed Cognition: [facility] for tasks performed  Rada Hay 10/09/2011, 5:17 PM

## 2011-10-09 NOTE — Plan of Care (Signed)
Problem: Consults Goal: Diagnosis- Total Joint Replacement Outcome: Progressing Revision Total Hip     

## 2011-10-09 NOTE — Progress Notes (Signed)
10/09/2011 Raynelle Bring BSN CCM [phone] CM spoke with patient. Pt plans to return to her home in [place] where she will have personal aide, spouse, and church family as caregivers. States she already has RW but needs BSC and possibly a wheelchair. She wants [place] for [facility] services .States she has used them in the past and she does anticipate that she will require home abx. [facility] notified/cm will follow

## 2011-10-09 NOTE — Progress Notes (Signed)
OT Screen Order received, chart reviewed. Spoke briefly w/ pt who states she has all necessary DME & AE from previous hip sx except for 3:1 BSC. Pt will also have prn A at home from family. No f/u OT needed. Recommend 3:1 for home use & sign off.  Garrel Ridgel, OTR/L  Pager [phone] 10/09/2011

## 2011-10-10 ENCOUNTER — Encounter (HOSPITAL_COMMUNITY): Payer: Self-pay | Admitting: Orthopedic Surgery

## 2011-10-10 LAB — BASIC METABOLIC PANEL
CO2: 25 mEq/L (ref 19–32)
Calcium: 8.6 mg/dL (ref 8.4–10.5)
Chloride: 105 mEq/L (ref 96–112)
Glucose, Bld: 116 mg/dL — ABNORMAL HIGH (ref 70–99)
Potassium: 4 mEq/L (ref 3.5–5.1)
Sodium: 135 mEq/L (ref 135–145)

## 2011-10-10 LAB — PREPARE RBC (CROSSMATCH)

## 2011-10-10 LAB — CBC
Hemoglobin: 7.4 g/dL — ABNORMAL LOW (ref 12.0–15.0)
Platelets: 209 10*3/uL (ref 150–400)
RBC: 2.74 MIL/uL — ABNORMAL LOW (ref 3.87–5.11)
WBC: 13.9 10*3/uL — ABNORMAL HIGH (ref 4.0–10.5)

## 2011-10-10 MED ORDER — SODIUM CHLORIDE 0.9 % IJ SOLN
10.0000 mL | INTRAMUSCULAR | Status: DC | PRN
  Administered 2011-10-11 – 2011-10-13 (×2): 10 mL

## 2011-10-10 NOTE — Progress Notes (Signed)
Subjective: 2 Days Post-Op Procedure(s) (LRB): TOTAL HIP REVISION (Right)   Patient reports pain as mild. No events.   Objective:   VITALS:   Filed Vitals:   10/10/11 0445  BP: 114/65  Pulse: 88  Temp: 97.6 F (36.4 C)  Resp: 20    Neurovascular intact Dorsiflexion/Plantar flexion intact Incision: dressing C/D/I No cellulitis present Compartment soft  LABS  Basename 10/10/11 0458 10/09/11 0424 10/08/11 2050 10/08/11 1530  HGB 7.4* 8.8* 9.1* --  HCT 22.5* 26.8* 27.8* --  WBC 13.9* 10.2 -- 6.7  PLT 209 232 -- 254     Basename 10/10/11 0458 10/09/11 0424 10/08/11 1946 10/08/11 1530  NA 135 131* 137 --  K 4.0 4.4 3.8 --  BUN 10 11 -- 12  CREATININE 0.65 0.59 -- 0.64  GLUCOSE 116* 169* 150* --     Assessment/Plan: 2 Days Post-Op Procedure(s) (LRB): TOTAL HIP REVISION (Right)   PICC line placement Infectious Disease Consult Up with therapy Discharge home with home health On Thursday / Friday   Anastasio Auerbach. Lenore Moyano   PAC  10/10/2011, 9:21 AM

## 2011-10-10 NOTE — Progress Notes (Signed)
Physical Therapy Treatment Patient Details Name: [name] MRN: [id number] DOB: 09-04-1956 Today's Date: 10/10/[phone] [place] PT Assessment/Plan  PT - Assessment/Plan Comments on Treatment Session: pt appears tired, less animated.  pt to get blood.  activity limited today. plan for DC home when able to mobilize more PT Plan: Discharge plan remains appropriate;Frequency remains appropriate PT Frequency: 7X/week Follow Up Recommendations: Home health PT Equipment Recommended: 3 in 1 bedside comode PT Goals  Acute Rehab PT Goals PT Goal Formulation: With patient Time For Goal Achievement: 7 days Pt will go Supine/Side to Sit: with supervision;with HOB 0 degrees PT Goal: Supine/Side to Sit - Progress: Progressing toward goal Pt will go Sit to Supine/Side: with supervision PT Goal: Sit to Supine/Side - Progress: Progressing toward goal Pt will go Sit to Stand: with supervision PT Goal: Sit to Stand - Progress: Progressing toward goal Pt will go Stand to Sit: with supervision PT Goal: Stand to Sit - Progress: Progressing toward goal  PT Treatment Precautions/Restrictions  Precautions Precautions: Posterior Hip Required Braces or Orthoses: No Knee Immobilizer:  (Dr. Charlann Boxer stated not necessary) Restrictions Weight Bearing Restrictions: Yes RLE Weight Bearing: Partial weight bearing RLE Partial Weight Bearing Percentage or Pounds: 50% Mobility (including Balance) Bed Mobility Bed Mobility: Yes Supine to Sit: 3: Mod assist;With rails Sit to Supine - Right: 3: Mod assist;HOB flat Sit to Supine - Right Details (indicate cue type and reason): assist to place RLE onto bed Transfers Transfers: Yes Sit to Stand: 4: Min assist;From bed;With upper extremity assist;From elevated surface;From chair/3-in-1;With armrests Stand to Sit: 4: Min assist;To bed;To chair/3-in-1;With armrests Stand Pivot Transfers: 4: Min assist;From elevated surface;With armrests Stand Pivot Transfer Details  (indicate cue type and reason): from bed to [facility] to bed Ambulation/Gait Assistive device: Rolling walker    Exercise    End of Session PT - End of Session Activity Tolerance: Patient limited by fatigue (pt to get 2 units of blood) General Behavior During Session: [facility] for tasks performed Cognition: [facility] for tasks performed  Rada Hay 10/10/2011, 2:53 PM

## 2011-10-11 DIAGNOSIS — M009 Pyogenic arthritis, unspecified: Secondary | ICD-10-CM

## 2011-10-11 LAB — TYPE AND SCREEN
Antibody Screen: POSITIVE
DAT, IgG: NEGATIVE
Donor AG Type: NEGATIVE
Donor AG Type: NEGATIVE
PT AG Type: NEGATIVE
Unit division: 0
Unit division: 0

## 2011-10-11 LAB — WOUND CULTURE

## 2011-10-11 LAB — HEMOGLOBIN AND HEMATOCRIT, BLOOD
HCT: 25.5 % — ABNORMAL LOW (ref 36.0–46.0)
Hemoglobin: 8.3 g/dL — ABNORMAL LOW (ref 12.0–15.0)

## 2011-10-11 MED ORDER — CEFAZOLIN SODIUM-DEXTROSE 2-3 GM-% IV SOLR
2.0000 g | Freq: Three times a day (TID) | INTRAVENOUS | Status: DC
  Administered 2011-10-11 – 2011-10-13 (×6): 2 g via INTRAVENOUS
  Filled 2011-10-11 (×10): qty 50

## 2011-10-11 NOTE — Consult Note (Signed)
Infectious Diseases Initial Consultation         Date of Admission:  10/08/2011  Date of Consult:  10/11/2011  Reason for Consult: Chronic right prosthetic hip infection Referring Physician: Dr. Lajoyce Corners   Problem List:  Degenerative joint disease 1. Right total hip arthroplasty 2010 2. Revision arthroplasty November 2011 3. MSSA infection requiring incision and drainage with poly-exchange December 2011 followed by 3 months of antibiotics  4. MSSA and Escherichia coli infection requiring resection arthroplasty 10/08/2011  Recommendations: 1. 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 Now that she has had resection arthroplasty that should be curable with a six-week course of antibiotics. Both organisms are susceptible to cefazolin so I will start that now and arrange followup in our clinic.   HPI: [name] is a 55 y.o. female who has had chronic smoldering right prosthetic hip infection. She underwent incision and drainage with poly-exchange one year ago. Operative cultures grew MSSA she was treated with 6 weeks of IV daptomycin followed by 6 more weeks of oral trimethoprim sulfamethoxazole. She did well for a period of time but then had more pain in late April and saw my partner, Dr. Daiva Eves, in May. I believe he recommended consideration of resection arthroplasty but she did not want to consider this because she was not having any more pain. Over the last 2 months she's had increasing pain and was readmitted and underwent resection arthroplasty 3 days ago. Operative cultures are growing MSSA again as well as Escherichia coli.   Review of Systems: Pertinent items are noted in HPI.     [name] Kitchen docusate sodium  100 mg Oral BID  . DULoxetine  60 mg Oral QHS  . ferrous sulfate  325 mg Oral TID PC  . HYDROcodone-acetaminophen  1-2 tablet Oral Q4H  . polyethylene glycol   17 g Oral BID  . rivaroxaban  10 mg Oral Q24H    Past Medical History  Diagnosis Date  . MSSA (methicillin susceptible Staphylococcus aureus) infection   . Varicose vein   . T.T.P. syndrome     20 yrs ago-not seen anyone for 10 yrs.  . Headache     migraines  . Arthritis     right hip    History  Substance Use Topics  . Smoking status: Former Smoker -- 1.5 packs/day for 20 years    Types: Cigarettes    Quit date: 10/04/1990  . Smokeless tobacco: Former Neurosurgeon    Quit date: 03/09/1991  . Alcohol Use: Yes    Family History  Problem Relation Age of Onset  . Alzheimer's disease Father   . Prostate cancer Father   . Seizures     No Known Allergies  OBJECTIVE: Blood pressure 112/74, pulse 101, temperature 98 F (36.7 C), temperature source Oral, resp. rate 16, height 5' 6.5" (1.689 m), weight 107.956 kg (238 lb), last menstrual period 04/05/2011, SpO2 99.00%. General: She is sleepy and slightly nauseated Skin: PICC site appears normal Lungs: Clear Cor: Regular S1 and S2 with no murmurs    Results for orders placed during the hospital encounter of 10/08/11 (from the past 48 hour(s))  CBC     Status: Abnormal   Collection Time   10/10/11  4:58 AM      Component Value Range Comment   WBC 13.9 (*) 4.0 - 10.5 (K/uL)    RBC 2.74 (*)  3.87 - 5.11 (MIL/uL)    Hemoglobin 7.4 (*) 12.0 - 15.0 (g/dL)    HCT 16.1 (*) 09.6 - 46.0 (%)    MCV 82.1  78.0 - 100.0 (fL)    MCH 27.0  26.0 - 34.0 (pg)    MCHC 32.9  30.0 - 36.0 (g/dL)    RDW 04.5  40.9 - 81.1 (%)    Platelets 209  150 - 400 (K/uL)   BASIC METABOLIC PANEL     Status: Abnormal   Collection Time   10/10/11  4:58 AM      Component Value Range Comment   Sodium 135  135 - 145 (mEq/L)    Potassium 4.0  3.5 - 5.1 (mEq/L)    Chloride 105  96 - 112 (mEq/L)    CO2 25  19 - 32 (mEq/L)    Glucose, Bld 116 (*) 70 - 99 (mg/dL)    BUN 10  6 - 23 (mg/dL)    Creatinine, Ser 9.14  0.50 - 1.10 (mg/dL)    Calcium 8.6  8.4 - 10.5  (mg/dL)    GFR calc non Af Amer >90  >90 (mL/min)    GFR calc Af Amer >90  >90 (mL/min)   PREPARE RBC (CROSSMATCH)     Status: Normal   Collection Time   10/10/11 10:00 AM      Component Value Range Comment   Order Confirmation ORDER PROCESSED BY BLOOD BANK     HEMOGLOBIN AND HEMATOCRIT, BLOOD     Status: Abnormal   Collection Time   10/11/11  8:50 AM      Component Value Range Comment   Hemoglobin 8.3 (*) 12.0 - 15.0 (g/dL)    HCT 78.2 (*) 95.6 - 46.0 (%)       Component Value Date/Time   SDES HIP 10/08/2011 1813   SDES HIP 10/08/2011 1813   SPECREQUEST NONE 10/08/2011 1813   SPECREQUEST NONE 10/08/2011 1813   CULT  Value: FEW ESCHERICHIA COLI FEW STAPHYLOCOCCUS AUREUS Note: SUSCEPTIBILITIES PERFORMED ON PREVIOUS CULTURE WITHIN THE LAST 5 DAYS. 10/08/2011 1813   CULT NO ANAEROBES ISOLATED; CULTURE IN PROGRESS FOR 5 DAYS 10/08/2011 1813   REPTSTATUS PENDING 10/08/2011 1813   REPTSTATUS PENDING 10/08/2011 1813   No results found.  Cliffton Asters, MD [facility] for Infectious Diseases [facility] [phone] pager   [phone] cell 10/11/2011, 3:46 PM

## 2011-10-11 NOTE — Progress Notes (Signed)
Physical Therapy Treatment Patient Details Name: [name] MRN: [id number] DOB: 1956/02/13 Today's Date: 10/11/2011  PT Assessment/Plan  PT - Assessment/Plan Comments on Treatment Session: HR continues to quickly increase with minimal activity. This session up to 146. Pt. is mobilizing well, limited by HR.continue to monitor HR for any activity. RN aware and took set of VS PT Plan: Discharge plan remains appropriate;Frequency remains appropriate PT Frequency: 7X/week Follow Up Recommendations: Home health PT Equipment Recommended: 3 in 1 bedside comode PT Goals  Acute Rehab PT Goals PT Goal Formulation: With patient Time For Goal Achievement: 7 days Pt will go Supine/Side to Sit: with supervision;with HOB 0 degrees PT Goal: Supine/Side to Sit - Progress: Progressing toward goal Pt will go Sit to Supine/Side: with supervision PT Goal: Sit to Supine/Side - Progress: Progressing toward goal Pt will go Sit to Stand: with supervision PT Goal: Sit to Stand - Progress: Progressing toward goal Pt will go Stand to Sit: with supervision PT Goal: Stand to Sit - Progress: Progressing toward goal Pt will Ambulate: 51 - 150 feet;with supervision;with rolling walker PT Goal: Ambulate - Progress:  (not progressing due to iincreased HR)  PT Treatment Precautions/Restrictions  Precautions Precautions: Posterior Hip Required Braces or Orthoses: No Knee Immobilizer:  (Dr. Charlann Boxer stated not necessary) Restrictions Weight Bearing Restrictions: Yes RLE Weight Bearing: Partial weight bearing RLE Partial Weight Bearing Percentage or Pounds: 50% Mobility (including Balance) Bed Mobility Bed Mobility: Yes Supine to Sit: 5: Supervision;HOB elevated (Comment degrees);With rails (30) Supine to Sit Details (indicate cue type and reason): pt uses LLE to catch toes on R to move RLE to EOB Sit to Supine - Right: 4: Min assist;HOB flat;With rail Sit to Supine - Right Details (indicate cue type and reason):  assist RLE onto bed Transfers Transfers: Yes Sit to Stand: 4: Min assist;From bed;With upper extremity assist;From elevated surface;From chair/3-in-1;With armrests Sit to Stand Details (indicate cue type and reason): pt places RLE forward Stand to Sit: To bed;With upper extremity assist  Ambulation/Gait Ambulation/Gait: Yes Ambulation/Gait Assistance: 4: Min assist Ambulation/Gait Assistance Details (indicate cue type and reason): slow pace due to increase inHR that pt is able to detect, also c/o feeling light headed.RN present, HR 146 after amb. 20 feet. Pt sat down, then assisted to bed.  HR after rest 99. Ambulation Distance (Feet): 20 Feet Assistive device: Rolling walker Gait Pattern: Step-to pattern Gait velocity: slow  Posture/Postural Control Posture/Postural Control: No significant limitations Exercise    End of Session PT - End of Session Activity Tolerance: Patient limited by fatigue (limited by HR, ) Patient left: with call bell in reach;in bed;with family/visitor present Nurse Communication:  (For HR at 140) General Behavior During Session:  (seems down about HR) Cognition: WFL for tasks performed  Rada Hay 10/11/2011, 3:10 PM

## 2011-10-11 NOTE — Progress Notes (Signed)
Physical Therapy Treatment Patient Details Name: [name] MRN: [id number] DOB: October 06, 1956 Today's Date: 10/11/[phone] 2g PT Assessment/Plan  PT - Assessment/Plan Comments on Treatment Session: Pt is limited by increase in HR with very minimal activity, HR 140 after ambulating to BR.  RN allowed a short walk with brief rests. HR decreasing with short breaks, still high HR.  Pti is improving in mobility with very little pain. plan to monitor HR with activity.  PT Plan: Discharge plan remains appropriate;Frequency remains appropriate PT Frequency: 7X/week Follow Up Recommendations: Home health PT Equipment Recommended: 3 in 1 bedside comode PT Goals  Acute Rehab PT Goals PT Goal Formulation: With patient Time For Goal Achievement: 7 days Pt will go Supine/Side to Sit: with supervision;with HOB 0 degrees PT Goal: Supine/Side to Sit - Progress: Progressing toward goal Pt will go Sit to Supine/Side: with supervision PT Goal: Sit to Supine/Side - Progress: Progressing toward goal Pt will go Sit to Stand: with supervision PT Goal: Sit to Stand - Progress: Progressing toward goal Pt will go Stand to Sit: with supervision PT Goal: Stand to Sit - Progress: Progressing toward goal Pt will Ambulate: 51 - 150 feet;with supervision;with rolling walker PT Goal: Ambulate - Progress: Progressing toward goal  PT Treatment Precautions/Restrictions  Precautions Precautions: Posterior Hip Required Braces or Orthoses: No Knee Immobilizer:  (Dr. Charlann Boxer stated not necessary) Restrictions Weight Bearing Restrictions: Yes RLE Weight Bearing: Partial weight bearing RLE Partial Weight Bearing Percentage or Pounds: 50% Mobility (including Balance) Bed Mobility Bed Mobility: Yes Supine to Sit: 5: Supervision;HOB elevated (Comment degrees) (30) Supine to Sit Details (indicate cue type and reason): pt uses LLE to catch toes on R to move RLE to EOB Transfers Transfers: Yes Sit to Stand: 4: Min  assist;From bed;With upper extremity assist;From elevated surface;From chair/3-in-1;With armrests Sit to Stand Details (indicate cue type and reason): pt places RLE forward Stand to Sit: To bed;To toilet;With upper extremity assist Stand to Sit Details: pt declined elevated toilet riser over commode Ambulation/Gait Ambulation/Gait: Yes Ambulation/Gait Assistance: 4: Min assist Ambulation/Gait Assistance Details (indicate cue type and reason): 40 Ambulation Distance (Feet): 40 Feet Assistive device: Rolling walker Gait Pattern: Step-to pattern Gait velocity: slow  Posture/Postural Control Posture/Postural Control: No significant limitations Exercise    End of Session PT - End of Session Activity Tolerance: Patient limited by fatigue (limited by HR, ) Patient left: in chair;with call bell in reach Nurse Communication:  (For HR at 140) General Behavior During Session:  (seems down about HR) Cognition: WFL for tasks performed  Rada Hay 10/11/2011, 3:02 PM

## 2011-10-11 NOTE — Progress Notes (Signed)
Subjective: 3 Days Post-Op Procedure(s) (LRB): TOTAL HIP REVISION (Right)   Patient reports pain as mild.  Objective:   VITALS:   Filed Vitals:   10/11/11 0500  BP: 112/71  Pulse: 95  Temp: 98.3 F (36.8 C)  Resp: 18    Neurovascular intact Dorsiflexion/Plantar flexion intact Incision: dressing C/D/I No cellulitis present Compartment soft  LABS  Basename 10/10/11 0458 10/09/11 0424 10/08/11 2050 10/08/11 1530  HGB 7.4* 8.8* 9.1* --  HCT 22.5* 26.8* 27.8* --  WBC 13.9* 10.2 -- 6.7  PLT 209 232 -- 254     Basename 10/10/11 0458 10/09/11 0424 10/08/11 1946 10/08/11 1530  NA 135 131* 137 --  K 4.0 4.4 3.8 --  BUN 10 11 -- 12  CREATININE 0.65 0.59 -- 0.64  GLUCOSE 116* 169* 150* --     Assessment/Plan: 3 Days Post-Op Procedure(s) (LRB): TOTAL HIP REVISION (Right)   Up with therapy Plan for discharge tomorrow to home with HHPT   [name]. [name]   PAC  10/11/2011, 8:09 AM

## 2011-10-12 DIAGNOSIS — M009 Pyogenic arthritis, unspecified: Secondary | ICD-10-CM

## 2011-10-12 MED ORDER — FERROUS SULFATE 325 (65 FE) MG PO TABS: 325.0000 mg | ORAL_TABLET | Freq: Three times a day (TID) | ORAL | Status: DC

## 2011-10-12 MED ORDER — PROMETHAZINE HCL 25 MG/ML IJ SOLN
12.5000 mg | Freq: Four times a day (QID) | INTRAMUSCULAR | Status: DC | PRN
  Administered 2011-10-12 (×2): 12.5 mg via INTRAVENOUS
  Filled 2011-10-12 (×2): qty 1

## 2011-10-12 MED ORDER — BISACODYL 10 MG RE SUPP: 10.0000 mg | Freq: Every day | RECTAL | Status: DC | PRN

## 2011-10-12 MED ORDER — METHOCARBAMOL 500 MG PO TABS: 500.0000 mg | ORAL_TABLET | Freq: Four times a day (QID) | ORAL | Status: AC | PRN

## 2011-10-12 MED ORDER — POLYETHYLENE GLYCOL 3350 17 G PO PACK: 17.0000 g | PACK | Freq: Two times a day (BID) | ORAL | Status: AC

## 2011-10-12 MED ORDER — PROMETHAZINE HCL 12.5 MG PO TABS: 12.5000 mg | ORAL_TABLET | Freq: Four times a day (QID) | ORAL | Status: AC | PRN

## 2011-10-12 MED ORDER — FLEET ENEMA 7-19 GM/118ML RE ENEM: 1.0000 | ENEMA | Freq: Every day | RECTAL | Status: DC | PRN

## 2011-10-12 MED ORDER — CEFAZOLIN SODIUM-DEXTROSE 2-3 GM-% IV SOLR: 2.0000 g | Freq: Three times a day (TID) | INTRAVENOUS | Status: DC

## 2011-10-12 MED ORDER — HYDROCODONE-ACETAMINOPHEN 7.5-325 MG PO TABS: 1.0000 | ORAL_TABLET | ORAL | Status: AC

## 2011-10-12 MED ORDER — ASPIRIN EC 325 MG PO TBEC: 325.0000 mg | DELAYED_RELEASE_TABLET | Freq: Two times a day (BID) | ORAL | Status: AC

## 2011-10-12 MED ORDER — MAGNESIUM CITRATE PO SOLN: 1.0000 | Freq: Once | ORAL | Status: AC | PRN

## 2011-10-12 MED ORDER — DIPHENHYDRAMINE HCL 25 MG PO CAPS: 25.0000 mg | ORAL_CAPSULE | Freq: Four times a day (QID) | ORAL | Status: DC | PRN

## 2011-10-12 MED ORDER — DSS 100 MG PO CAPS: 100.0000 mg | ORAL_CAPSULE | Freq: Two times a day (BID) | ORAL | Status: AC

## 2011-10-12 MED ORDER — PROMETHAZINE HCL 25 MG PO TABS: 12.5000 mg | ORAL_TABLET | Freq: Four times a day (QID) | ORAL | Status: DC | PRN

## 2011-10-12 NOTE — Progress Notes (Signed)
Subjective: 4 Days Post-Op Procedure(s) (LRB): TOTAL HIP REVISION (Right)   Patient reports pain as mild. Complaining of some nausea, and not having a bowel movement. Feels that she will probably be ready to be discharged later in the day.  Objective:   VITALS:   Filed Vitals:   10/12/11 0530  BP: 122/77  Pulse: 101  Temp: 98.8 F (37.1 C)  Resp: 16    Neurovascular intact Dorsiflexion/Plantar flexion intact Incision: dressing C/D/I No cellulitis present Compartment soft  LABS No new labs   Assessment/Plan: 4 Days Post-Op Procedure(s) (LRB): TOTAL HIP REVISION (Right)  Increase IV fluid to keep hydrated. Add meds to encourage a bowel movement Add Phenergan Up with therapy Discharge home with home health today, if feeling better in the afternoon.   [name] [name]   PAC  10/12/2011, 8:45 AM

## 2011-10-12 NOTE — Progress Notes (Signed)
Patient ID: [name], female   DOB: 1956-06-10, 55 y.o.   MRN: [id number]  INFECTIOUS DISEASE PROGRESS NOTE    Date of Admission:  10/08/2011           Day 2 Ancef         Principal Problem:  *S/P revision of right total hip      . ceFAZolin (ANCEF) IV  2 g Intravenous Q8H  . docusate sodium  100 mg Oral BID  . DULoxetine  60 mg Oral QHS  . ferrous sulfate  325 mg Oral TID PC  . HYDROcodone-acetaminophen  1-2 tablet Oral Q4H  . polyethylene glycol  17 g Oral BID  . rivaroxaban  10 mg Oral Q24H    Subjective: She has had some nausea today. She denies having any significant postoperative pain.  Objective: Temp:  [98.8 F (37.1 C)-100.2 F (37.9 C)] 98.8 F (37.1 C) (12/21 0530) Pulse Rate:  [101-102] 101  (12/21 0530) Resp:  [16] 16  (12/21 0530) BP: (119-122)/(74-77) 122/77 mmHg (12/21 0530) SpO2:  [94 %-96 %] 94 % (12/21 0530)  General: She is sitting up in a chair and looks somewhat uncomfortable do to her nausea. Skin: PICC site is okay   Lab Results Lab Results  Component Value Date   WBC 13.9* 10/10/2011   HGB 8.3* 10/11/2011   HCT 25.5* 10/11/2011   MCV 82.1 10/10/2011   PLT 209 10/10/2011    Lab Results  Component Value Date   CREATININE 0.65 10/10/2011   BUN 10 10/10/2011   NA 135 10/10/2011   K 4.0 10/10/2011   CL 105 10/10/2011   CO2 25 10/10/2011    Lab Results  Component Value Date   ALT 10 03/09/2011   AST 13 03/09/2011   ALKPHOS 86 03/09/2011   BILITOT 0.4 03/09/2011       Microbiology: Recent Results (from the past 240 hour(s))  SURGICAL PCR SCREEN     Status: Normal   Collection Time   10/08/11  2:58 PM      Component Value Range Status Comment   MRSA, PCR NEGATIVE  NEGATIVE  Final    Staphylococcus aureus NEGATIVE  NEGATIVE  Final   ANAEROBIC CULTURE     Status: Normal (Preliminary result)   Collection Time   10/08/11  6:09 PM      Component Value Range Status Comment   Specimen Description HIP INFECTED RIGHT TOTAL HIP    Final    Special Requests NONE   Final    Gram Stain     Final    Value: RARE WBC PRESENT,BOTH PMN AND MONONUCLEAR     NO SQUAMOUS EPITHELIAL CELLS SEEN     NO ORGANISMS SEEN   Culture     Final    Value: NO ANAEROBES ISOLATED; CULTURE IN PROGRESS FOR 5 DAYS   Report Status PENDING   Incomplete   GRAM STAIN     Status: Normal   Collection Time   10/08/11  6:09 PM      Component Value Range Status Comment   Specimen Description HIP INFECTED RIGHT TOTAL HIP   Final    Special Requests NONE   Final    Gram Stain     Final    Value: FEW NO WBC SEEN WBC PRESENT,BOTH PMN AND MONONUCLEAR     NO ORGANISMS SEEN     Gram Stain Report Called to,Read Back By and Verified With: ALLREDW/[id number]/[id number]   Report Status 10/08/2011 FINAL  Final   WOUND CULTURE     Status: Normal   Collection Time   10/08/11  6:09 PM      Component Value Range Status Comment   Specimen Description HIP INFECTED TIGHT TOTAL HIP   Final    Special Requests NONE   Final    Gram Stain     Final    Value: FEW WBC PRESENT,BOTH PMN AND MONONUCLEAR     NO ORGANISMS SEEN     Gram Stain Report Called to,Read Back By and Verified With: Gram Stain Report Called to,Read Back By and Verified With: ALLREDW 1921 10/08/11 MURPHYD Performed at [facility]   Culture     Final    Value: FEW STAPHYLOCOCCUS AUREUS     Note: RIFAMPIN AND GENTAMICIN SHOULD NOT BE USED AS SINGLE DRUGS FOR TREATMENT OF STAPH INFECTIONS.   Report Status 10/11/2011 FINAL   Final    Organism ID, Bacteria STAPHYLOCOCCUS AUREUS   Final   TISSUE CULTURE     Status: Normal (Preliminary result)   Collection Time   10/08/11  6:13 PM      Component Value Range Status Comment   Specimen Description HIP   Final    Special Requests NONE   Final    Gram Stain PENDING   Incomplete    Culture     Final    Value: FEW ESCHERICHIA COLI     FEW STAPHYLOCOCCUS AUREUS     Note: SUSCEPTIBILITIES PERFORMED ON PREVIOUS CULTURE WITHIN THE LAST 5 DAYS.   Report  Status PENDING   Incomplete    Organism ID, Bacteria ESCHERICHIA COLI   Final   ANAEROBIC CULTURE     Status: Normal (Preliminary result)   Collection Time   10/08/11  6:13 PM      Component Value Range Status Comment   Specimen Description HIP   Final    Special Requests NONE   Final    Gram Stain     Final    Value: RARE WBC PRESENT,BOTH PMN AND MONONUCLEAR     NO SQUAMOUS EPITHELIAL CELLS SEEN     NO ORGANISMS SEEN   Culture     Final    Value: NO ANAEROBES ISOLATED; CULTURE IN PROGRESS FOR 5 DAYS   Report Status PENDING   Incomplete    No results found for this basename: CRP    No results found for this basename: ESRSEDRATE, SEDRATE, POCTSEDRATE    Studies/Results: No results found.   Assessment: She needs at least 6 weeks of IV Ancef for her MSSA and Escherichia coli prosthetic hip infection.  Plan: 1. Continue Ancef 2. Discharge home soon 3. I will arrange followup in our clinic in about 3 weeks. I will sign off now.   Cliffton Asters, MD [facility] for Infectious Diseases [facility] [phone] pager   [phone] cell 10/12/2011, 4:21 PM

## 2011-10-12 NOTE — Progress Notes (Signed)
Physical Therapy Treatment Patient Details Name: [name] MRN: [id number] DOB: 1956-01-11 Today's Date: 10/12/2011 1515 - 1543; 2GT PT Assessment/Plan  PT - Assessment/Plan Comments on Treatment Session: HR continues to quickly increase with minimal activity. This session up to 146. Pt. is mobilizing well, limited by HR.continue to monitor HR for any activity. PT Plan: Discharge plan remains appropriate;Frequency remains appropriate PT Frequency: 7X/week Follow Up Recommendations: Home health PT Equipment Recommended: Wheelchair (measurements) PT Goals  Acute Rehab PT Goals Time For Goal Achievement: 7 days Pt will go Supine/Side to Sit: with supervision;with HOB 0 degrees Pt will go Sit to Supine/Side: with supervision Pt will go Sit to Stand: with supervision PT Goal: Sit to Stand - Progress: Met Pt will go Stand to Sit: with supervision PT Goal: Stand to Sit - Progress: Met Pt will Ambulate: 51 - 150 feet;with supervision;with rolling walker PT Goal: Ambulate - Progress: Progressing toward goal  PT Treatment Precautions/Restrictions  Precautions Precautions: Posterior Hip Required Braces or Orthoses: No Knee Immobilizer:  (Dr. Charlann Boxer stated not necessary) Restrictions Weight Bearing Restrictions: Yes RLE Weight Bearing: Partial weight bearing RLE Partial Weight Bearing Percentage or Pounds: 50% Mobility (including Balance) Transfers Sit to Stand: 5: Supervision;With armrests;With upper extremity assist Sit to Stand Details (indicate cue type and reason): cues for LE position Stand to Sit: 5: Supervision;With armrests;To chair/3-in-1 Stand to Sit Details: cues for LE position Ambulation/Gait Ambulation/Gait Assistance: 5: Supervision;4: Min assist Ambulation/Gait Assistance Details (indicate cue type and reason): min cues for ER rotation on L and for position from RW Ambulation Distance (Feet):  (35', 30' and 63') Assistive device: Rolling walker Gait Pattern: Step-to  pattern Gait velocity: slow    Exercise    End of Session PT - End of Session Activity Tolerance: Patient limited by fatigue;Other (comment) (pt with elevated HR - 149+ with each amb attempt - RN aware) Patient left: in chair;with call bell in reach;with family/visitor present Nurse Communication:  (Pt HR with exertion) General Behavior During Session: [facility] for tasks performed Cognition: [facility] for tasks performed  [name] 10/12/2011, 4:32 PM

## 2011-10-13 LAB — ANAEROBIC CULTURE

## 2011-10-13 LAB — TISSUE CULTURE

## 2011-10-13 MED ORDER — CIPROFLOXACIN HCL 500 MG PO TABS: 500.0000 mg | ORAL_TABLET | Freq: Two times a day (BID) | ORAL | Status: AC

## 2011-10-13 MED ORDER — HEPARIN SOD (PORK) LOCK FLUSH 100 UNIT/ML IV SOLN: 250.0000 [IU] | Freq: Every day | INTRAVENOUS | Status: DC

## 2011-10-13 MED ORDER — HEPARIN SOD (PORK) LOCK FLUSH 100 UNIT/ML IV SOLN
250.0000 [IU] | INTRAVENOUS | Status: DC | PRN
  Administered 2011-10-13: 500 [IU]

## 2011-10-13 MED ORDER — PROMETHAZINE HCL 12.5 MG PO TABS: 12.5000 mg | ORAL_TABLET | Freq: Four times a day (QID) | ORAL | Status: AC | PRN

## 2011-10-13 NOTE — Progress Notes (Signed)
Cm spoke with pt concerning d/c planning. Per pt choice [facility] to provide HHPT/HHRN. Physical therapy suggest wheelchair. DME delivery per Laurell Josephs scheduled this am to pt's residence. Pt states having RW and BSC at home. Patient's spouse to assist in home care.  Leonie Green Weekend Case Manager [phone]

## 2011-10-13 NOTE — Progress Notes (Deleted)
Subjective: 1 Days Post-Op Procedure(s) (LRB): TOTAL HIP REVISION (Right)   Patient reports pain as mild. No events.  Objective:   VITALS:   Filed Vitals:    10/09/11 0445  BP:   123/81  Pulse:   112 Temp:   97.6 F (36.4 C)  Resp:   16     Neurovascular intact Dorsiflexion/Plantar flexion intact Incision: dressing C/D/I No cellulitis present Compartment soft   LABS   Basename   10/09/11 0424     HGB   8.8*     HCT   26.8*     WBC   10.2     PLT   232       Basename   10/09/11 0424     NA   131*     K   4.4     BUN   11     CREATININE   0.59     GLUCOSE   169*       Assessment/Plan: 1 Day Post-Op Procedure(s) (LRB): TOTAL HIP REVISION (Right)  Foley D/C'ed HV drain D/C'ed IV changed to saline lock Advance diet Up with therapy Discharge home with home health when discharged   Anastasio Auerbach. Tylie Golonka   PAC  10/13/2011, 9:04 AM

## 2011-10-13 NOTE — Discharge Summary (Signed)
Physician Discharge Summary  Patient ID: [name] MRN: [id number] DOB/AGE: January 07, 1956 55 y.o.  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 female complaining of right hip pain. Pt orginally had a total hip arthroplasty in November of 2010. Subsequently she had a revision of the total hip in November of 2011. In December of that year she had an abscess of the right hip. Pain had continually increased since having surgery. Pt has tried various conservative treatments which have failed to alleviate their symptoms. Various options are discussed with the patient. Risks, benefits and expectations were discussed with the patient. Patient understand the risks, benefits and expectations and wishes to proceed with surgery.    PCP: No primary provider on file.   Discharged Condition: good  Hospital Course:  Patient underwent the above stated procedure on 10/08/2011. Patient tolerated the procedure well and brought to the recovery room in good condition and subsequently to the floor.  POD #1 BP: 111/75 ; Pulse: 90 ; Temp: 97.2 F Pt's foley was removed, as well as the hemovac drain removed. IV was changed to a saline lock. Patient reports pain as mild. No events. Neurovascular intact, dorsiflexion/plantar flexion intact, incision: dressing C/D/I, no cellulitis present and compartment soft.  LABS  Basename  10/09/11 0424      HGB  8.8*      HCT  26.8*       POD #2  BP: 114/65 ; Pulse: 88 ; Temp: 97.6 F Patient reports pain as mild. No events. Neurovascular intact, dorsiflexion/plantar flexion intact, incision: dressing C/D/I, no cellulitis present and  compartment soft. PICC line was placed, infectious disease was consulted. Pt was given 2 units of PRBC for symptomatic acute blood loss anemia.  LABS  Basename  10/10/11 0458      HGB  7.4*      HCT  22.5*       POD #3  BP: 112/71 ; Pulse: 95 ; Temp: 98.3 F Patient reports pain as mild. No events. Feels better after receiving blood. However feeling some nausea. Neurovascular intact, dorsiflexion/plantar flexion intact, incision: dressing C/D/I, no cellulitis present and compartment soft.  LABS  Basename  10/11/11 0850      HGB  8.3*      HCT  25.5*       POD #4 BP: 122/77 ; Pulse: 101 ; Temp: 98.8 F Patient reports pain as mild. Complaining of some nausea, and not having a bowel movement. Feels that she will probably be ready to be discharged later in the day. Neurovascular intact, dorsiflexion/plantar flexion intact, incision: dressing C/D/I, no cellulitis present and compartment soft.  LABS   No new labs  POD #5 BP: 121/76 ; Pulse: 96 ; Temp: 98.2 F Patient reports pain as mild. Nausea has improved some, bowel movement yesterday may improved some of her symptoms. Feels ready to be discharged home. Neurovascular intact, dorsiflexion/plantar flexion intact, incision: dressing C/D/I, no cellulitis present and compartment soft.  LABS   No new labs  Discharge Exam: Extremities: Homans sign is negative, no sign of DVT, no edema, redness or tenderness in the calves or thighs and no ulcers, gangrene or trophic changes  Disposition: Home  or Self Care with HHPT. Follow up at [facility] in 2 weeks.  Discharge Orders    Future Orders Please Complete By Expires   Diet - low sodium heart healthy      Call MD / Call 911      Comments:   If you experience chest pain or shortness of breath, CALL 911 and be transported to the hospital emergency room.  If you develope a fever above 101 F, pus (white drainage) or increased drainage or redness at the wound, or calf pain, call  your surgeon's office.   Discharge instructions      Comments:   Maintain surgical dressing for 8 days, then replace with gauze and tape. Keep the area dry and clean until follow up. Follow up in 2 weeks at [facility]. Call with any questions or concerns.     Constipation Prevention      Comments:   Drink plenty of fluids.  Prune juice may be helpful.  You may use a stool softener, such as Colace (over the counter) 100 mg twice a day.  Use MiraLax (over the counter) for constipation as needed.   Increase activity slowly as tolerated      Weight Bearing as taught in Physical Therapy      Comments:   Use a walker or crutches as instructed.   Driving restrictions      Comments:   No driving for 4 weeks   Change dressing      Comments:   Maintain surgical dressing for 8 days, the replace with 4x4 guaze and tape. Keep area dry and clean.   TED hose      Comments:   Use stockings (TED hose) for 2 weeks on both leg(s).  You may remove them at night for sleeping.      Current Discharge Medication List    START taking these medications   Details  aspirin EC 325 MG tablet Take 1 tablet (325 mg total) by mouth 2 (two) times daily. X 4 weeks Qty: 60 tablet, Refills: 0    ceFAZolin (ANCEF) 2-3 GM-% SOLR Inject 50 mLs (2 g total) into the vein every 8 (eight) hours. Qty: 126 each, Refills: 0    ciprofloxacin (CIPRO) 500 MG tablet Take 1 tablet (500 mg total) by mouth 2 (two) times daily. Qty: 28 tablet, Refills: 0   Comments: Take for 2 weeks. Rx was called into Pharmacy.    diphenhydrAMINE (BENADRYL) 25 mg capsule Take 1 capsule (25 mg total) by mouth every 6 (six) hours as needed for itching, allergies or sleep.    docusate sodium 100 MG CAPS Take 100 mg by mouth 2 (two) times daily.    ferrous sulfate 325 (65 FE) MG tablet Take 1 tablet (325 mg total) by mouth 3 (three) times daily after meals.    HYDROcodone-acetaminophen (NORCO) 7.5-325 MG per tablet Take 1-2 tablets  by mouth every 4 (four) hours. Qty: 120 tablet, Refills: 0    methocarbamol (ROBAXIN) 500 MG tablet Take 1 tablet (500 mg total) by mouth every 6 (six) hours as needed. Qty: 50 tablet, Refills: 0    polyethylene glycol (MIRALAX / GLYCOLAX) packet Take 17 g by mouth 2 (two) times daily.    !! promethazine (PHENERGAN) 12.5 MG tablet Take 1 tablet (12.5 mg total) by mouth every 6 (six) hours as needed for nausea. Qty: 30 tablet, Refills: 0    !! promethazine (PHENERGAN) 12.5 MG tablet Take 1 tablet (12.5 mg total) by  mouth every 6 (six) hours as needed. Qty: 30 tablet, Refills: 1     !! - Potential duplicate medications found. Please discuss with provider.    CONTINUE these medications which have NOT CHANGED   Details  DULoxetine (CYMBALTA) 60 MG capsule Take 60 mg by mouth at bedtime.     hydrocortisone (ANUSOL-HC) 25 MG suppository Place 25 mg rectally 2 (two) times daily as needed. HEMORRHOIDS       STOP taking these medications     acetaminophen (TYLENOL) 325 MG tablet Comments:  Reason for Stopping:       HYDROcodone-acetaminophen (NORCO) 5-325 MG per tablet Comments:  Reason for Stopping:       ibuprofen (ADVIL,MOTRIN) 200 MG tablet Comments:  Reason for Stopping:            Signed: Anastasio Auerbach. Emeterio Balke   PAC  10/13/2011, 8:53 AM

## 2011-10-13 NOTE — Progress Notes (Signed)
Patient ID: [name], female   DOB: Jun 24, 1956, 55 y.o.   MRN: [id number] Subjective: 5 Days Post-Op Procedure(s) (LRB): TOTAL HIP REVISION (Right)    Patient reports pain as mild. Nausea has improved some, bowel movement yesterday may improved some of her symptoms  Objective:   VITALS:   Filed Vitals:   10/13/11 0500  BP: 121/76  Pulse: 96  Temp: 98.2 F (36.8 C)  Resp: 16    Incision: dressing C/D/I  LABS  Basename 10/11/11 0850  HGB 8.3*  HCT 25.5*  WBC --  PLT --    No results found for this basename: NA:3,K:3,BUN:3,CREATININE:3,GLUCOSE:3 in the last 72 hours  No results found for this basename: LABPT:2,INR:2 in the last 72 hours   Assessment/Plan: 5 Days Post-Op Procedure(s) (LRB): TOTAL HIP REVISION (Right)   Discharge home with home health Will add phenergan for d/c for nausea

## 2011-10-15 NOTE — Progress Notes (Signed)
Discharge summary sent to payer through [facility]  

## 2011-11-23 ENCOUNTER — Ambulatory Visit
Admission: RE | Admit: 2011-11-23 | Discharge: 2011-11-23 | Disposition: A | Discharge status: Home / Self Care | Payer: BC Managed Care – PPO | Source: Ambulatory Visit | Attending: Orthopedic Surgery | Admitting: Orthopedic Surgery

## 2011-11-23 ENCOUNTER — Other Ambulatory Visit: Payer: Self-pay | Admitting: Orthopedic Surgery

## 2011-11-23 DIAGNOSIS — R102 Pelvic and perineal pain: Secondary | ICD-10-CM

## 2011-11-23 DIAGNOSIS — M25551 Pain in right hip: Secondary | ICD-10-CM

## 2012-02-25 ENCOUNTER — Encounter (HOSPITAL_COMMUNITY): Payer: Self-pay | Admitting: Pharmacy Technician

## 2012-02-28 NOTE — H&P (Signed)
[name], [name] NO.:  [id number]  MEDICAL RECORD NO.:  [id number]  LOCATION:  PERIO                        FACILITY:  [facility]  PHYSICIAN:  Madlyn Frankel. Charlann Boxer, M.D.  DATE OF BIRTH:  1956-08-23  DATE OF ADMISSION:  12/18/2011 DATE OF DISCHARGE:                             HISTORY & PHYSICAL   DATE OF SURGERY:  Mar 10, 2012.  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  The surgery risks, benefits, and aftercare were discussed with the patient. Questions invited and answered.  Note that the patient's medical doctor is Dr. Abner Greenspan.  She is planning on going home after surgery.  She is not a candidate for tranexamic acid and she is given her home medications of aspirin, Robaxin, iron, MiraLax, and Colace here in the office today.  PAST MEDICAL HISTORY:  Drug allergies none.  CURRENT MEDICATIONS:  Cymbalta 60 mg at bedtime.  SERIOUS MEDICAL ILLNESSES: 1. Migraines. 2. Depressions. 3. 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  PREVIOUS SURGERIES: 1. Tonsillectomy. 2. Right total hip arthroplasty. 3. Right total hip revision. 4. Right hip I and D and removal of implant.  SOCIAL HISTORY:  The patient does not smoke.  She does not drink.  She lives at home.  FAMILY HISTORY:  Positive for cancer, diabetes, Alzheimer's, and epilepsy.  REVIEW OF SYSTEMS:  CENTRAL NERVOUS SYSTEM:  Positive for migraines and depression.  PULMONARY:  Negative shortness breath, PND, and orthopnea. CARDIOVASCULAR:  Positive for history of ITTP 20 years ago with no sequelae and varicose veins.  GI:  Positive for hemorrhoids.  Negative for ulcers or hepatitis.  GU:  Negative for urinary tract difficulty. MUSCULOSKELETAL:  Positive as in HPI.  PHYSICAL EXAMINATION:  VITAL SIGNS:  BP 120/84, pulse 88 and  regular. Respirations 14. HEENT:  Head:  Normocephalic.  Nose patent.  Ears patent.  Pupils equal, round, reactive to light.  Throat without injection. NECK:  Supple without adenopathy.  Carotids 2+ without bruit. CHEST:  Clear auscultation.  No rales or rhonchi.  Respirations 14. HEART:  Regular rate and rhythm at 88 beats per minute without murmur. ABDOMEN:  Soft.  Active bowel sounds.  No masses or organomegaly. NEUROLOGIC:  The patient is alert and oriented to time, place, and person.  Cranial nerves 2-12 grossly intact. EXTREMITIES:  Show the right hip with decreased range of motion, status post removal of total hip arthroplasty with revision upcoming. Neurovascular status intact.  ASSESSMENT:  Status post infected total hip arthroplasty.  PLAN:  Revision total hip arthroplasty on the right hip.     Jaquelyn Bitter. Coda Filler, P.A.   ______________________________ Madlyn Frankel Charlann Boxer, M.D.    SJC/MEDQ  D:  02/27/2012  T:  02/28/2012  Job:  [id number]

## 2012-03-04 ENCOUNTER — Encounter (HOSPITAL_COMMUNITY)
Admission: RE | Admit: 2012-03-04 | Discharge: 2012-03-04 | Disposition: A | Discharge status: Home / Self Care | Payer: BC Managed Care – PPO | Source: Ambulatory Visit | Attending: Orthopedic Surgery | Admitting: Orthopedic Surgery

## 2012-03-04 ENCOUNTER — Encounter (HOSPITAL_COMMUNITY): Payer: Self-pay

## 2012-03-04 HISTORY — DX: Encounter for other specified aftercare: Z51.89

## 2012-03-04 HISTORY — DX: Major depressive disorder, single episode, unspecified: F32.9

## 2012-03-04 HISTORY — DX: Depression, unspecified: F32.A

## 2012-03-04 HISTORY — DX: Malignant (primary) neoplasm, unspecified: C80.1

## 2012-03-04 LAB — CBC
HCT: 37.7 % (ref 36.0–46.0)
Hemoglobin: 13 g/dL (ref 12.0–15.0)
MCH: 28.9 pg (ref 26.0–34.0)
MCHC: 34.5 g/dL (ref 30.0–36.0)
MCV: 83.8 fL (ref 78.0–100.0)
RBC: 4.5 MIL/uL (ref 3.87–5.11)

## 2012-03-04 LAB — URINALYSIS, ROUTINE W REFLEX MICROSCOPIC
Bilirubin Urine: NEGATIVE
Glucose, UA: NEGATIVE mg/dL
Nitrite: NEGATIVE
Specific Gravity, Urine: 1.02 (ref 1.005–1.030)
pH: 6 (ref 5.0–8.0)

## 2012-03-04 LAB — URINE MICROSCOPIC-ADD ON

## 2012-03-04 LAB — BASIC METABOLIC PANEL
BUN: 13 mg/dL (ref 6–23)
CO2: 26 mEq/L (ref 19–32)
Glucose, Bld: 100 mg/dL — ABNORMAL HIGH (ref 70–99)
Potassium: 4.2 mEq/L (ref 3.5–5.1)
Sodium: 138 mEq/L (ref 135–145)

## 2012-03-04 LAB — DIFFERENTIAL
Eosinophils Absolute: 0.1 10*3/uL (ref 0.0–0.7)
Eosinophils Relative: 1 % (ref 0–5)
Lymphs Abs: 1.8 10*3/uL (ref 0.7–4.0)
Monocytes Absolute: 0.4 10*3/uL (ref 0.1–1.0)
Monocytes Relative: 7 % (ref 3–12)

## 2012-03-04 LAB — PROTIME-INR: INR: 1.01 (ref 0.00–1.49)

## 2012-03-04 MED ORDER — CHLORHEXIDINE GLUCONATE 4 % EX LIQD
60.0000 mL | Freq: Once | CUTANEOUS | Status: DC
  Filled 2012-03-04: qty 60

## 2012-03-04 NOTE — Patient Instructions (Addendum)
20 HAYDN CUSH  03/04/2012   Your procedure is scheduled on:  03/10/12  Report to SHORT STAY DEPT  at 7:30 AM.  Call this number if you have problems the morning of surgery: [phone]   Remember:   Do not eat food or drink liquids AFTER MIDNIGHT    Take these medicines the morning of surgery with A SIP OF WATER: NONE   Do not wear jewelry, make-up or nail polish.  Do not wear lotions, powders, or perfumes.   Do not shave legs or underarms 48 hrs. before surgery (men may shave face)  Do not bring valuables to the hospital.  Contacts, dentures or bridgework may not be worn into surgery.  Leave suitcase in the car. After surgery it may be brought to your room.  For patients admitted to the hospital, checkout time is 11:00 AM the day of discharge.   Patients discharged the day of surgery will not be allowed to drive home.    Special Instructions:   Please read over the following fact sheets that you were given: MRSA  Information / Incentive Spirometer               SHOWER WITH BETASEPT THE NIGHT BEFORE SURGERY AND THE MORNING OF SURGERY

## 2012-03-10 ENCOUNTER — Ambulatory Visit (HOSPITAL_COMMUNITY): Payer: BC Managed Care – PPO

## 2012-03-10 ENCOUNTER — Encounter (HOSPITAL_COMMUNITY): Payer: Self-pay | Admitting: *Deleted

## 2012-03-10 ENCOUNTER — Encounter (HOSPITAL_COMMUNITY)
Admission: RE | Disposition: A | Discharge status: Home Health | Payer: Self-pay | Source: Ambulatory Visit | Attending: Orthopedic Surgery

## 2012-03-10 ENCOUNTER — Encounter (HOSPITAL_COMMUNITY): Payer: Self-pay | Admitting: Anesthesiology

## 2012-03-10 ENCOUNTER — Ambulatory Visit (HOSPITAL_COMMUNITY): Payer: BC Managed Care – PPO | Admitting: Anesthesiology

## 2012-03-10 ENCOUNTER — Inpatient Hospital Stay (HOSPITAL_COMMUNITY)
Admission: RE | Admit: 2012-03-10 | Discharge: 2012-03-12 | DRG: 817 | Disposition: A | Discharge status: Home Health | Payer: BC Managed Care – PPO | Source: Ambulatory Visit | Attending: Orthopedic Surgery | Admitting: Orthopedic Surgery

## 2012-03-10 DIAGNOSIS — M169 Osteoarthritis of hip, unspecified: Secondary | ICD-10-CM | POA: Diagnosis present

## 2012-03-10 DIAGNOSIS — F329 Major depressive disorder, single episode, unspecified: Secondary | ICD-10-CM | POA: Diagnosis present

## 2012-03-10 DIAGNOSIS — M1610 Unilateral primary osteoarthritis, unspecified hip: Secondary | ICD-10-CM | POA: Diagnosis present

## 2012-03-10 DIAGNOSIS — F3289 Other specified depressive episodes: Secondary | ICD-10-CM | POA: Diagnosis present

## 2012-03-10 DIAGNOSIS — M21859 Other specified acquired deformities of unspecified thigh: Principal | ICD-10-CM | POA: Diagnosis present

## 2012-03-10 DIAGNOSIS — Z01812 Encounter for preprocedural laboratory examination: Secondary | ICD-10-CM

## 2012-03-10 DIAGNOSIS — Z9889 Other specified postprocedural states: Secondary | ICD-10-CM

## 2012-03-10 DIAGNOSIS — D649 Anemia, unspecified: Secondary | ICD-10-CM | POA: Diagnosis not present

## 2012-03-10 DIAGNOSIS — Z96649 Presence of unspecified artificial hip joint: Secondary | ICD-10-CM

## 2012-03-10 HISTORY — DX: Other specified postprocedural states: Z98.890

## 2012-03-10 HISTORY — PX: TOTAL HIP REVISION: SHX763

## 2012-03-10 SURGERY — TOTAL HIP REVISION
Anesthesia: General | Site: Hip | Laterality: Right | Wound class: Clean

## 2012-03-10 MED ORDER — PROPOFOL 10 MG/ML IV EMUL
INTRAVENOUS | Status: DC | PRN
  Administered 2012-03-10: 150 mg via INTRAVENOUS

## 2012-03-10 MED ORDER — ROCURONIUM BROMIDE 100 MG/10ML IV SOLN
INTRAVENOUS | Status: DC | PRN
  Administered 2012-03-10: 50 mg via INTRAVENOUS
  Administered 2012-03-10: 25 mg via INTRAVENOUS

## 2012-03-10 MED ORDER — METHOCARBAMOL 500 MG PO TABS
500.0000 mg | ORAL_TABLET | Freq: Four times a day (QID) | ORAL | Status: DC | PRN
  Administered 2012-03-11: 500 mg via ORAL
  Filled 2012-03-10 (×3): qty 1

## 2012-03-10 MED ORDER — NEOSTIGMINE METHYLSULFATE 1 MG/ML IJ SOLN
INTRAMUSCULAR | Status: DC | PRN
  Administered 2012-03-10: 4 mg via INTRAVENOUS

## 2012-03-10 MED ORDER — ALUM & MAG HYDROXIDE-SIMETH 200-200-20 MG/5ML PO SUSP: 30.0000 mL | ORAL | Status: DC | PRN

## 2012-03-10 MED ORDER — METOCLOPRAMIDE HCL 5 MG/ML IJ SOLN
5.0000 mg | Freq: Three times a day (TID) | INTRAMUSCULAR | Status: DC | PRN
Start: 2012-03-10 — End: 2012-03-12

## 2012-03-10 MED ORDER — HYDROMORPHONE HCL PF 1 MG/ML IJ SOLN
INTRAMUSCULAR | Status: AC
  Filled 2012-03-10: qty 1

## 2012-03-10 MED ORDER — RIVAROXABAN 10 MG PO TABS
10.0000 mg | ORAL_TABLET | ORAL | Status: DC
  Administered 2012-03-11: 10 mg via ORAL
  Filled 2012-03-10 (×2): qty 1

## 2012-03-10 MED ORDER — DIPHENHYDRAMINE HCL 25 MG PO CAPS: 25.0000 mg | ORAL_CAPSULE | Freq: Four times a day (QID) | ORAL | Status: DC | PRN

## 2012-03-10 MED ORDER — ZOLPIDEM TARTRATE 5 MG PO TABS: 5.0000 mg | ORAL_TABLET | Freq: Every evening | ORAL | Status: DC | PRN

## 2012-03-10 MED ORDER — CEFAZOLIN SODIUM-DEXTROSE 2-3 GM-% IV SOLR
2.0000 g | Freq: Four times a day (QID) | INTRAVENOUS | Status: AC
  Administered 2012-03-10 – 2012-03-11 (×3): 2 g via INTRAVENOUS
  Filled 2012-03-10 (×4): qty 50

## 2012-03-10 MED ORDER — GLYCOPYRROLATE 0.2 MG/ML IJ SOLN
INTRAMUSCULAR | Status: DC | PRN
  Administered 2012-03-10: .6 mg via INTRAVENOUS

## 2012-03-10 MED ORDER — CEFAZOLIN SODIUM-DEXTROSE 2-3 GM-% IV SOLR
2.0000 g | Freq: Once | INTRAVENOUS | Status: AC
  Administered 2012-03-10: 2 g via INTRAVENOUS

## 2012-03-10 MED ORDER — MENTHOL 3 MG MT LOZG: 1.0000 | LOZENGE | OROMUCOSAL | Status: DC | PRN

## 2012-03-10 MED ORDER — HYDROCODONE-ACETAMINOPHEN 7.5-325 MG PO TABS
1.0000 | ORAL_TABLET | ORAL | Status: DC
  Administered 2012-03-10: 1 via ORAL
  Filled 2012-03-10: qty 1

## 2012-03-10 MED ORDER — SODIUM CHLORIDE 0.9 % IR SOLN
Status: DC | PRN
  Administered 2012-03-10: 1000 mL

## 2012-03-10 MED ORDER — HYDROMORPHONE HCL PF 1 MG/ML IJ SOLN
0.5000 mg | INTRAMUSCULAR | Status: DC | PRN
  Administered 2012-03-10: 1 mg via INTRAVENOUS
  Filled 2012-03-10: qty 1

## 2012-03-10 MED ORDER — LACTATED RINGERS IV SOLN
INTRAVENOUS | Status: DC
  Administered 2012-03-10 (×2): via INTRAVENOUS
  Administered 2012-03-10: 1000 mL via INTRAVENOUS
  Administered 2012-03-10 (×2): via INTRAVENOUS

## 2012-03-10 MED ORDER — ONDANSETRON HCL 4 MG/2ML IJ SOLN
INTRAMUSCULAR | Status: DC | PRN
  Administered 2012-03-10: 4 mg via INTRAVENOUS

## 2012-03-10 MED ORDER — DOCUSATE SODIUM 100 MG PO CAPS
100.0000 mg | ORAL_CAPSULE | Freq: Two times a day (BID) | ORAL | Status: DC
  Administered 2012-03-10 – 2012-03-12 (×4): 100 mg via ORAL

## 2012-03-10 MED ORDER — PHENOL 1.4 % MT LIQD: 1.0000 | OROMUCOSAL | Status: DC | PRN

## 2012-03-10 MED ORDER — METHOCARBAMOL 100 MG/ML IJ SOLN
500.0000 mg | Freq: Four times a day (QID) | INTRAVENOUS | Status: DC | PRN
  Administered 2012-03-10: 500 mg via INTRAVENOUS
  Filled 2012-03-10: qty 5

## 2012-03-10 MED ORDER — ONDANSETRON HCL 4 MG PO TABS: 4.0000 mg | ORAL_TABLET | Freq: Four times a day (QID) | ORAL | Status: DC | PRN

## 2012-03-10 MED ORDER — DULOXETINE HCL 60 MG PO CPEP
60.0000 mg | ORAL_CAPSULE | Freq: Every day | ORAL | Status: DC
  Administered 2012-03-10: 60 mg via ORAL
  Filled 2012-03-10 (×2): qty 1

## 2012-03-10 MED ORDER — HETASTARCH-ELECTROLYTES 6 % IV SOLN
INTRAVENOUS | Status: DC | PRN
  Administered 2012-03-10: 13:00:00 via INTRAVENOUS

## 2012-03-10 MED ORDER — CEFAZOLIN SODIUM-DEXTROSE 2-3 GM-% IV SOLR
INTRAVENOUS | Status: AC
  Filled 2012-03-10: qty 50

## 2012-03-10 MED ORDER — MIDAZOLAM HCL 5 MG/5ML IJ SOLN
INTRAMUSCULAR | Status: DC | PRN
  Administered 2012-03-10: 2 mg via INTRAVENOUS

## 2012-03-10 MED ORDER — ONDANSETRON HCL 4 MG/2ML IJ SOLN: 4.0000 mg | Freq: Four times a day (QID) | INTRAMUSCULAR | Status: DC | PRN

## 2012-03-10 MED ORDER — EPHEDRINE SULFATE 50 MG/ML IJ SOLN
INTRAMUSCULAR | Status: DC | PRN
  Administered 2012-03-10: 5 mg via INTRAVENOUS
  Administered 2012-03-10: 2.5 mg via INTRAVENOUS
  Administered 2012-03-10: 5 mg via INTRAVENOUS
  Administered 2012-03-10: 2.5 mg via INTRAVENOUS

## 2012-03-10 MED ORDER — DEXAMETHASONE SODIUM PHOSPHATE 10 MG/ML IJ SOLN
10.0000 mg | Freq: Once | INTRAMUSCULAR | Status: AC
  Administered 2012-03-11: 10 mg via INTRAVENOUS
  Filled 2012-03-10: qty 1

## 2012-03-10 MED ORDER — METOCLOPRAMIDE HCL 10 MG PO TABS: 5.0000 mg | ORAL_TABLET | Freq: Three times a day (TID) | ORAL | Status: DC | PRN

## 2012-03-10 MED ORDER — POLYETHYLENE GLYCOL 3350 17 G PO PACK
17.0000 g | PACK | Freq: Two times a day (BID) | ORAL | Status: DC
  Administered 2012-03-10 – 2012-03-12 (×4): 17 g via ORAL

## 2012-03-10 MED ORDER — BISACODYL 5 MG PO TBEC: 5.0000 mg | DELAYED_RELEASE_TABLET | Freq: Every day | ORAL | Status: DC | PRN

## 2012-03-10 MED ORDER — FENTANYL CITRATE 0.05 MG/ML IJ SOLN
INTRAMUSCULAR | Status: DC | PRN
  Administered 2012-03-10: 50 ug via INTRAVENOUS
  Administered 2012-03-10: 100 ug via INTRAVENOUS
  Administered 2012-03-10 (×4): 50 ug via INTRAVENOUS

## 2012-03-10 MED ORDER — SODIUM CHLORIDE 0.9 % IV SOLN
100.0000 mL/h | INTRAVENOUS | Status: AC
  Administered 2012-03-10 – 2012-03-11 (×2): 100 mL/h via INTRAVENOUS
  Filled 2012-03-10 (×9): qty 1000

## 2012-03-10 MED ORDER — FERROUS SULFATE 325 (65 FE) MG PO TABS
325.0000 mg | ORAL_TABLET | Freq: Three times a day (TID) | ORAL | Status: DC
  Administered 2012-03-10 – 2012-03-12 (×4): 325 mg via ORAL
  Filled 2012-03-10 (×8): qty 1

## 2012-03-10 MED ORDER — ACETAMINOPHEN 10 MG/ML IV SOLN
INTRAVENOUS | Status: AC
  Filled 2012-03-10: qty 100

## 2012-03-10 MED ORDER — ACETAMINOPHEN 10 MG/ML IV SOLN
INTRAVENOUS | Status: DC | PRN
  Administered 2012-03-10: 1000 mg via INTRAVENOUS

## 2012-03-10 MED ORDER — FLEET ENEMA 7-19 GM/118ML RE ENEM: 1.0000 | ENEMA | Freq: Once | RECTAL | Status: AC | PRN

## 2012-03-10 MED ORDER — HYDROMORPHONE HCL PF 1 MG/ML IJ SOLN
0.2500 mg | INTRAMUSCULAR | Status: DC | PRN
  Administered 2012-03-10 (×4): 0.5 mg via INTRAVENOUS

## 2012-03-10 SURGICAL SUPPLY — 59 items
ACETAB CUP W/GRIPTION 54 (Plate) ×2 IMPLANT
BAG SPEC THK2 15X12 ZIP CLS (MISCELLANEOUS) ×1
BAG ZIPLOCK 12X15 (MISCELLANEOUS) ×2 IMPLANT
BLADE SAW SGTL 18X1.27X75 (BLADE) ×2 IMPLANT
BRUSH FEMORAL CANAL (MISCELLANEOUS) ×2 IMPLANT
CLOTH BEACON ORANGE TIMEOUT ST (SAFETY) ×2 IMPLANT
CUP ACETAB W/GRIPTION 54 (Plate) IMPLANT
DRAPE C-ARM 42X72 X-RAY (DRAPES) ×1 IMPLANT
DRAPE INCISE IOBAN 85X60 (DRAPES) ×2 IMPLANT
DRAPE OEC MINIVIEW 54X84 (DRAPES) ×1 IMPLANT
DRAPE ORTHO SPLIT 77X108 STRL (DRAPES) ×4
DRAPE POUCH INSTRU U-SHP 10X18 (DRAPES) ×2 IMPLANT
DRAPE SURG 17X11 SM STRL (DRAPES) ×2 IMPLANT
DRAPE SURG ORHT 6 SPLT 77X108 (DRAPES) ×2 IMPLANT
DRAPE U-SHAPE 47X51 STRL (DRAPES) ×2 IMPLANT
DRSG EMULSION OIL 3X16 NADH (GAUZE/BANDAGES/DRESSINGS) ×2 IMPLANT
DRSG MEPILEX BORDER 4X12 (GAUZE/BANDAGES/DRESSINGS) ×1 IMPLANT
DRSG MEPILEX BORDER 4X4 (GAUZE/BANDAGES/DRESSINGS) ×2 IMPLANT
DRSG MEPILEX BORDER 4X8 (GAUZE/BANDAGES/DRESSINGS) ×2 IMPLANT
DURAPREP 26ML APPLICATOR (WOUND CARE) ×2 IMPLANT
ELECT BLADE TIP CTD 4 INCH (ELECTRODE) ×2 IMPLANT
ELECT REM PT RETURN 9FT ADLT (ELECTROSURGICAL) ×2
ELECTRODE REM PT RTRN 9FT ADLT (ELECTROSURGICAL) ×1 IMPLANT
ELIMINATOR HOLE APEX DEPUY (Hips) ×1 IMPLANT
EVACUATOR 1/8 PVC DRAIN (DRAIN) ×2 IMPLANT
FACESHIELD LNG OPTICON STERILE (SAFETY) ×8 IMPLANT
GLOVE BIOGEL PI IND STRL 7.5 (GLOVE) ×1 IMPLANT
GLOVE BIOGEL PI IND STRL 8 (GLOVE) ×1 IMPLANT
GLOVE BIOGEL PI INDICATOR 7.5 (GLOVE) ×1
GLOVE BIOGEL PI INDICATOR 8 (GLOVE) ×1
GLOVE ORTHO TXT STRL SZ7.5 (GLOVE) ×4 IMPLANT
GLOVE SURG ORTHO 8.0 STRL STRW (GLOVE) ×2 IMPLANT
GOWN BRE IMP PREV XXLGXLNG (GOWN DISPOSABLE) ×4 IMPLANT
GOWN STRL NON-REIN LRG LVL3 (GOWN DISPOSABLE) ×2 IMPLANT
HANDPIECE INTERPULSE COAX TIP (DISPOSABLE) ×2
HEAD CERAMIC 36 PLUS5 (Hips) ×1 IMPLANT
KIT BASIN OR (CUSTOM PROCEDURE TRAY) ×2 IMPLANT
LINER NEUTRAL 54X36MM PLUS 4 (Hips) ×1 IMPLANT
MANIFOLD NEPTUNE II (INSTRUMENTS) ×2 IMPLANT
NS IRRIG 1000ML POUR BTL (IV SOLUTION) ×4 IMPLANT
PACK TOTAL JOINT (CUSTOM PROCEDURE TRAY) ×2 IMPLANT
POSITIONER SURGICAL ARM (MISCELLANEOUS) ×2 IMPLANT
PRESSURIZER FEMORAL UNIV (MISCELLANEOUS) ×2 IMPLANT
RECLAIM DISTAL TAPERED 17X140 (Hips) ×1 IMPLANT
RECLAIM PRX BDY CONE 20X85 (Hips) ×1 IMPLANT
SCREW 6.5MMX35MM (Screw) ×1 IMPLANT
SET HNDPC FAN SPRY TIP SCT (DISPOSABLE) ×1 IMPLANT
SPONGE LAP 18X18 X RAY DECT (DISPOSABLE) ×3 IMPLANT
SPONGE LAP 4X18 X RAY DECT (DISPOSABLE) ×2 IMPLANT
STAPLER VISISTAT 35W (STAPLE) ×2 IMPLANT
SUCTION FRAZIER TIP 10 FR DISP (SUCTIONS) ×2 IMPLANT
SUT VIC AB 1 CT1 36 (SUTURE) ×4 IMPLANT
SUT VIC AB 2-0 CT1 27 (SUTURE) ×6
SUT VIC AB 2-0 CT1 TAPERPNT 27 (SUTURE) ×3 IMPLANT
SUT VLOC 180 0 24IN GS25 (SUTURE) ×4 IMPLANT
TOWEL OR 17X26 10 PK STRL BLUE (TOWEL DISPOSABLE) ×4 IMPLANT
TOWER CARTRIDGE SMART MIX (DISPOSABLE) ×2 IMPLANT
TRAY FOLEY CATH 14FRSI W/METER (CATHETERS) ×2 IMPLANT
WATER STERILE IRR 1500ML POUR (IV SOLUTION) ×2 IMPLANT

## 2012-03-10 NOTE — Anesthesia Postprocedure Evaluation (Signed)
  Anesthesia Post-op Note  Patient: [name]  Procedure(s) Performed: Procedure(s) (LRB): TOTAL HIP REVISION (Right)  Patient Location: PACU  Anesthesia Type: General  Level of Consciousness: oriented and sedated  Airway and Oxygen Therapy: Patient Spontanous Breathing and Patient connected to nasal cannula oxygen  Post-op Pain: mild  Post-op Assessment: Post-op Vital signs reviewed, Patient's Cardiovascular Status Stable, Respiratory Function Stable and Patent Airway  Post-op Vital Signs: stable  Complications: No apparent anesthesia complications

## 2012-03-10 NOTE — Brief Op Note (Signed)
03/10/2012  [facility]  MRN: [id number] CSN: [id number]  1:34 PM  PATIENT:  [name]  56 y.o. female  PRE-OPERATIVE DIAGNOSIS:  Status Post Resection of Infected  Right Total Hip replacement with previous cemented implant  POST-OPERATIVE DIAGNOSIS:  Status Post Resection of Infected Right Total Hip  PROCEDURE:  Procedure(s) (LRB): Re-implantation of RIGHT THR, revision TOTAL HIP REVISION (Right)  SURGEON:  Surgeon(s) and Role:    * [name], MD - Primary  PHYSICIAN ASSISTANT: Lanney Gins, PA-C   ANESTHESIA:   general  EBL:  Total I/O In: 4350 [I.V.:3850; IV Piggyback:500] Out: 1975 [Urine:475; Blood:1500]  BLOOD ADMINISTERED:none  DRAINS: (1 medium) Hemovact drain(s) in the right hip with  Suction Open   LOCAL MEDICATIONS USED:  NONE  SPECIMEN:  No Specimen  DISPOSITION OF SPECIMEN:  N/A  COUNTS:  YES  TOURNIQUET:  * No tourniquets in log *  DICTATION: .Other Dictation: Dictation Number [id number]  PLAN OF CARE: Admit to inpatient   PATIENT DISPOSITION:  PACU - hemodynamically stable.   Delay start of Pharmacological VTE agent (>24hrs) due to surgical blood loss or risk of bleeding: no

## 2012-03-10 NOTE — Transfer of Care (Signed)
Immediate Anesthesia Transfer of Care Note  Patient: [name]  Procedure(s) Performed: Procedure(s) (LRB): TOTAL HIP REVISION (Right)  Patient Location: PACU  Anesthesia Type: General  Level of Consciousness: awake, sedated and patient cooperative  Airway & Oxygen Therapy: Patient connected to face mask oxygen  Post-op Assessment: Report given to PACU RN and Post -op Vital signs reviewed and stable  Post vital signs: Reviewed and stable  Complications: No apparent anesthesia complications

## 2012-03-10 NOTE — Progress Notes (Signed)
Utilization review completed.  

## 2012-03-10 NOTE — Anesthesia Preprocedure Evaluation (Addendum)
Anesthesia Evaluation  Patient identified by MRN, date of birth, ID band Patient awake    Reviewed: Allergy & Precautions, H&P , NPO status , Patient's Chart, lab work & pertinent test results, reviewed documented beta blocker date and time   Airway Mallampati: I TM Distance: >3 FB Neck ROM: Full    Dental  (+) Teeth Intact and Dental Advisory Given   Pulmonary neg pulmonary ROS,  breath sounds clear to auscultation        Cardiovascular negative cardio ROS  Rhythm:Regular Rate:Normal  Denies cardiac symptoms   Neuro/Psych negative neurological ROS  negative psych ROS   GI/Hepatic negative GI ROS, Neg liver ROS,   Endo/Other  negative endocrine ROS  Renal/GU negative Renal ROS  negative genitourinary   Musculoskeletal   Abdominal   Peds negative pediatric ROS (+)  Hematology negative hematology ROS (+)   Anesthesia Other Findings   Reproductive/Obstetrics negative OB ROS                           Anesthesia Physical Anesthesia Plan  ASA: II  Anesthesia Plan: General   Post-op Pain Management:    Induction: Intravenous  Airway Management Planned: Oral ETT  Additional Equipment:   Intra-op Plan:   Post-operative Plan: Extubation in OR  Informed Consent: I have reviewed the patients History and Physical, chart, labs and discussed the procedure including the risks, benefits and alternatives for the proposed anesthesia with the patient or authorized representative who has indicated his/her understanding and acceptance.   Dental advisory given  Plan Discussed with: CRNA and Surgeon  Anesthesia Plan Comments:         Anesthesia Quick Evaluation

## 2012-03-10 NOTE — Interval H&P Note (Signed)
History and Physical Interval Note:  03/10/2012 8:32 AM  [name]  has presented today for surgery, with the diagnosis of Status Post Resection of Right Total Hip  The various methods of treatment have been discussed with the patient and family. After consideration of risks, benefits and other options for treatment, the patient has consented to  Procedure(s) (LRB): RIGHT HIP REIMPLANTATION TOTAL HIP REVISION (Right) as a surgical intervention .  The patients' history has been reviewed, patient examined, no change in status, stable for surgery.  I have reviewed the patients' chart and labs.  Questions were answered to the patient's satisfaction.     [name]

## 2012-03-10 NOTE — Progress Notes (Signed)
Pt states she finished taking antibiotic for UTI 

## 2012-03-11 ENCOUNTER — Encounter (HOSPITAL_COMMUNITY): Payer: Self-pay | Admitting: Orthopedic Surgery

## 2012-03-11 LAB — BASIC METABOLIC PANEL
BUN: 6 mg/dL (ref 6–23)
CO2: 27 mEq/L (ref 19–32)
Calcium: 8.3 mg/dL — ABNORMAL LOW (ref 8.4–10.5)
Chloride: 105 mEq/L (ref 96–112)
Creatinine, Ser: 0.66 mg/dL (ref 0.50–1.10)
GFR calc Af Amer: >90 mL/min (ref >90)
GFR calc non Af Amer: >90 mL/min (ref >90)
Glucose, Bld: 142 mg/dL — ABNORMAL HIGH (ref 70–99)
Potassium: 3.5 mEq/L (ref 3.5–5.1)
Sodium: 138 mEq/L (ref 135–145)

## 2012-03-11 LAB — CBC
HCT: 25.9 % — ABNORMAL LOW (ref 36.0–46.0)
Hemoglobin: 8.7 g/dL — ABNORMAL LOW (ref 12.0–15.0)
RBC: 2.99 MIL/uL — ABNORMAL LOW (ref 3.87–5.11)
WBC: 6.9 10*3/uL (ref 4.0–10.5)

## 2012-03-11 LAB — TYPE AND SCREEN
ABO/RH(D): O POS
Antibody Identification: REACTIVE
Donor AG Type: NEGATIVE
Unit division: 0
Unit division: 0

## 2012-03-11 MED ORDER — ACETAMINOPHEN 325 MG PO TABS: 325.0000 mg | ORAL_TABLET | Freq: Four times a day (QID) | ORAL | Status: DC | PRN

## 2012-03-11 MED ORDER — DULOXETINE HCL 60 MG PO CPEP
60.0000 mg | ORAL_CAPSULE | Freq: Every day | ORAL | Status: DC
  Administered 2012-03-11: 60 mg via ORAL
  Filled 2012-03-11 (×2): qty 1

## 2012-03-11 MED ORDER — TRAMADOL HCL 50 MG PO TABS
50.0000 mg | ORAL_TABLET | Freq: Four times a day (QID) | ORAL | Status: DC | PRN
  Administered 2012-03-11 (×3): 100 mg via ORAL
  Filled 2012-03-11 (×4): qty 2

## 2012-03-11 MED ORDER — ENOXAPARIN SODIUM 40 MG/0.4ML ~~LOC~~ SOLN
40.0000 mg | SUBCUTANEOUS | Status: DC
  Administered 2012-03-12: 40 mg via SUBCUTANEOUS
  Filled 2012-03-11 (×2): qty 0.4

## 2012-03-11 NOTE — Progress Notes (Signed)
Subjective: 1 Day Post-Op Procedure(s) (LRB): TOTAL HIP REVISION (Right)   Patient reports pain as mild, pain well controlled. C/O HA and would like to decrease pain medicine and be able to take APAP for her HA. No other events throughout the night.  Objective:   VITALS:   Filed Vitals:   03/11/12 0610  BP: 108/65  Pulse: 76  Temp: 99.8 F (37.7 C)  Resp: 16    Neurovascular intact Dorsiflexion/Plantar flexion intact Incision: dressing C/D/I No cellulitis present Compartment soft  LABS  Basename 03/11/12 0415  HGB 8.7*  HCT 25.9*  WBC 6.9  PLT 150     Basename 03/11/12 0415  NA 138  K 3.5  BUN 6  CREATININE 0.66  GLUCOSE 142*     Assessment/Plan: 1 Day Post-Op Procedure(s) (LRB): TOTAL HIP REVISION (Right)   Foley cath d/c'ed HV drain d/c'ed Advance diet Up with therapy D/C IV fluids Plan for discharge tomorrow to home if continues to do well.   Anastasio Auerbach Shawnie Nicole   PAC  03/11/2012, 8:10 AM

## 2012-03-11 NOTE — Progress Notes (Signed)
Occupational Therapy Note Chart reviewed. Spoke to patient and she reports having multiple surgeries in the past and feels comfortable with ADL tasks for discharge. She has all DME and AE also. Husband present and agrees that OT is not indicated. Will sign off. Judithann Sauger OTR/L [phone] 03/11/2012

## 2012-03-11 NOTE — Care Management Note (Signed)
    Page 1 of 2   03/12/2012     12:49:18 PM   CARE MANAGEMENT NOTE 03/12/2012  Patient:  [name], [name]   Account Number:  [id number]  Date Initiated:  03/11/2012  Documentation initiated by:  Colleen Can  Subjective/Objective Assessment:   dx re-implantation/revision rt total hip arthroplasty     Action/Plan:   CM spoke with patient and spouse. Plans are for patient to return to her home in [place] where spouse will be caregiver. States she already has DME form prior surgery. Wants to use [place] for [facility] services.Hasused theml  previouly   Anticipated DC Date:  03/13/2012   Anticipated DC Plan:  HOME W HOME HEALTH SERVICES  In-house referral  NA      DC Planning Services  CM consult      [facility] Choice  HOME HEALTH   Choice offered to / List presented to:  C-1 Patient   DME arranged  NA      DME agency  NA     HH arranged  HH-2 PT      [facility] agency  [facility]   Status of service:  Completed, signed off Medicare Important Message given?  NO (If response is "NO", the following Medicare IM given date fields will be blank) Date Medicare IM given:   Date Additional Medicare IM given:    Discharge Disposition:  HOME W HOME HEALTH SERVICES  Per UR Regulation:    If discussed at Long Length of Stay Meetings, dates discussed:    Comments:  03/12/2012 Raynelle Bring BSN CCM [phone] Anticipate discharge today.[name] services in place and will start tomorrrow 03/13/2012  03/11/2012 Raynelle Bring BSN CCM [phone] List of [facility] agencies placed on shadow chart

## 2012-03-11 NOTE — Evaluation (Signed)
Physical Therapy Evaluation Patient Details Name: [name] MRN: [id number] DOB: 1956-03-27 Today's Date: 03/11/2012 Time: 1020-1046 PT Time Calculation (min): 26 min  PT Assessment / Plan / Recommendation Clinical Impression  pt tolerated ambulation on first time OOB. pt will benefit from PT to improve functional mobility to DC to home tomorrow.    PT Assessment  Patient needs continued PT services    Follow Up Recommendations  Home health PT;Supervision/Assistance - 24 hour    Barriers to Discharge        lEquipment Recommendations  None recommended by PT    Recommendations for Other Services     Frequency 7x week   Precautions / Restrictions Precautions Precautions: Posterior Hip Restrictions Weight Bearing Restrictions: No   Pertinent Vitals/Pain Pt reports 6/10. Had meds. HR 114      Mobility  Bed Mobility Bed Mobility: Supine to Sit Supine to Sit: 6: Modified independent (Device/Increase time) Details for Bed Mobility Assistance: pt had gotten herself to edge of bed w/ no assistance. Transfers Transfers: Sit to Stand;Stand to Sit Sit to Stand: 4: Min guard;With upper extremity assist;From bed;From chair/3-in-1 Stand to Sit: 5: Supervision;With upper extremity assist;With armrests Details for Transfer Assistance: pt is mobilizing very well , vc for precaution review Ambulation/Gait Ambulation/Gait Assistance: 4: Min guard Ambulation Distance (Feet): 80 Feet Assistive device: Rolling walker Ambulation/Gait Assistance Details: pt ambulated w/ no difficulties.  Gait Pattern: Step-through pattern    Exercises     PT Diagnosis: Difficulty walking;Acute pain  PT Problem List: Pain;Decreased mobility;Decreased range of motion PT Treatment Interventions: DME instruction;Gait training;Stair training;Functional mobility training;Therapeutic activities;Therapeutic exercise;Patient/family education   PT Goals Acute Rehab PT Goals PT Goal Formulation: With  patient Time For Goal Achievement: 03/18/12 Potential to Achieve Goals: Good Pt will go Supine/Side to Sit: Independently PT Goal: Supine/Side to Sit - Progress: Goal set today Pt will go Sit to Supine/Side: Independently PT Goal: Sit to Supine/Side - Progress: Goal set today Pt will go Sit to Stand: with modified independence PT Goal: Sit to Stand - Progress: Goal set today Pt will go Stand to Sit: with modified independence PT Goal: Stand to Sit - Progress: Goal set today Pt will Ambulate: 51 - 150 feet;with supervision;with rolling walker PT Goal: Ambulate - Progress: Goal set today Pt will Go Up / Down Stairs: 1-2 stairs;with min assist;with least restrictive assistive device PT Goal: Up/Down Stairs - Progress: Goal set today  Visit Information  Last PT Received On: 03/11/12 Assistance Needed: +1    Subjective Data  Subjective: I am doing we.  walking helps. Patient Stated Goal: to go home tomorrow.   Prior Functioning  Home Living Lives With: Spouse Available Help at Discharge: Family Type of Home: House Home Access: Stairs to enter Secretary/administrator of Steps: 5 Entrance Stairs-Rails: Right;Left;Can reach both Home Layout: One level Bathroom Shower/Tub: Health visitor: Handicapped height Home Adaptive Equipment: Environmental consultant - rolling;Straight cane Prior Function Level of Independence: Independent Able to Take Stairs?: Yes Driving: Yes Communication Communication: No difficulties    Cognition  Overall Cognitive Status: Appears within functional limits for tasks assessed/performed Arousal/Alertness: Awake/alert Orientation Level: Appears intact for tasks assessed Behavior During Session: [facility] for tasks performed    Extremity/Trunk Assessment Right Upper Extremity Assessment RUE ROM/Strength/Tone: Within functional levels Left Upper Extremity Assessment LUE ROM/Strength/Tone: Within functional levels Right Lower Extremity Assessment RLE  ROM/Strength/Tone: Deficits RLE ROM/Strength/Tone Deficits: pt is able to advance RLE w/ no difficulty RLE Sensation: WFL - Light Touch  Left Lower Extremity Assessment LLE ROM/Strength/Tone: WFL for tasks assessed Trunk Assessment Trunk Assessment: Normal   Balance    End of Session PT - End of Session Activity Tolerance: Patient tolerated treatment well Patient left: in chair;with call bell/phone within reach;with family/visitor present Nurse Communication: Mobility status   Rada Hay 03/11/2012, 4:49 PM  [phone]

## 2012-03-11 NOTE — Op Note (Signed)
[name], [name] NO.:  [id number]  MEDICAL RECORD NO.:  [id number]  LOCATION:  1603                         FACILITY:  [facility]  PHYSICIAN:  Madlyn Frankel. Charlann Boxer, M.D.  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  PROCEDURE:  Revision, right total hip replacement including removal of previous cemented right total hip replacement and reimplantation of a right total hip replacement.  COMPONENTS USED:  A DePuy size 54 Gription sector cup with a single cancellous screw, a 36 +4 neutral AltrX liner.  The femoral component was a size 17 x 140 mm Reclaim femoral stem with a 20/85 mm proximal body and a 36 +5 ceramic ball.  SURGEON:  Madlyn Frankel. Charlann Boxer, M.D.  ASSISTANT:  Lanney Gins, PA.  Please note that [name] was present for the entirety of the case and utilized for general facilitation of the case, retractor management, general immobilization and placement of the extremity.  He was also involved with primary wound closure.  ANESTHESIA:  General.  DRAINS:  One medium Hemovac.  BLOOD LOSS:  About 1500 mL.  COMPLICATIONS:  None apparent.  SPECIMENS:  None as there was clear synovial fluid in this knee joint.  INDICATIONS FOR PROCEDURE:  Ms. 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  Despite an attempt at salvage of the joint, she eventually failed this with loosening of her components.  She is now approximately 5 months out from resection of the  components and placement of an antibiotic laden cemented total hip replacement or Prostalac components. She had done well with this, but was at this point ready to proceed with reimplantation of the final and definitive components.  Her lab work was trending in the appropriate direction to near normal.  There was no recurrence of infection, off antibiotics for some time at this point.  After reviewing risks of recurrent infection, component failure, need for revision surgery, dislocation, and blood clots, consent was obtained for benefit of her final prosthetic component.  PROCEDURE IN DETAIL:  The patient was brought to the operative theater. Once adequate anesthesia was established, preoperative antibiotics, Ancef administered, she was positioned into the left lateral decubitus position right side up.  Once bony prominences were padded in appropriate position the right lower extremity was prepped and draped in a sterile fashion.  A time-out was performed identifying the patient, planned procedure, and extremity.  The old incision was excised.  Sharp dissection was carried down to identify the iliotibial band and gluteal fascia.  This was incised for a posterior approach once we incised this layer and encountered clear synovial fluid.  At this point, we dissected out the posterior aspect of the hip.  Once this was adequately exposed, we dislocated the  old prosthesis, removed the femoral head, identified the metal component of the cemented Prostalac hip was loose.  Once I removed some of the proximal cement, the stem was easily removed.  Unfortunately the remaining cement that was initially placed around the prosthesis was now within the femoral canal.  Approximately about an hour or so of time was spent trying to remove the cement from the proximal aspect of the femur based on the fact that I wished to maintain the integrity of her femoral canal.  During this process I actually  packed off the femur and worked on the acetabular side first.  The acetabulum was exposed.  The cemented portion for the polyethylene liner was removed.  The cement was removed easily.  I reamed with a 49 reamer, then up to 53 reamer with good bony bed preparation and anterior- posterior bony contact with the reamer.  I selected a 54 Gription cup, this final cup was then impacted and was positioned in approximately 40 degrees of abduction and 20 degrees of forward flexion, with a portion of the anterior rim exposed. A single cancellous screw was placed and I placed a trial liner at this point.  At this point, I returned attention to the femur.  We were able to with a bit more work remove the remaining cement.  I did use a mini C- arm draped appropriately and placed over the femur to confirm integrity of the canal upon removal of cement as well as bypassing the area making sure that we were in the femoral canal on AP and lateral planes.  Once this was confirmed I began reaming with the Reclaim system.  I reamed up to, on power, a 17 mm reamer with a very good bite into the cortical bone.  I chose this as my final implant.  We reamed to the 85 mm body, which was the green line based on the tip of the trochanter.  Based on this, and assuring that we had adequate room in the proximal femur, this stem was opened and subsequently impacted to the level 2 with a level of reaming.  Once this was done, we utilized a post into the final prosthesis for which we reamed the proximal femur, identifying that a 20 mm body would suffice.  Thus the 20/85 mm body was opened.  I did a trial reduction at this point with this 20/85 trial body with approximately 15-20 degrees of femoral anteversion and a 36 1.5 ball. With this, we found that the hip was very stable.  No evidence of any subluxation or impingement.  The leg lengths appeared to be near equal.  Given these findings, the trial components  were removed.  The final body had been opened and utilizing the instruments within the Reclaim system the body was impacted down to make contact with the stem.  Then using the tensioning device, the body was appropriately tensioned with approximately 2000 pounds of force into the stem.  Anteversion was utilized to match that of the trial reduction.  Once this was carried out we placed the locking bolt on the proximal aspect of the hip stem and then I did a trial reduction.  I selected a 36 +5 ball mainly because when I impacted the final stem it was just a little bit lower than where we initially had placed it.  The final 36 +5 Delta ceramic ball was impacted onto a clean and dry trunnion and the hip was reduced.  We had irrigated the hip  throughout the case and again at this point.  I did place a medium Hemovac drain deep.  I reapproximated some of the pseudocapsular tissue with a #1 Vicryl deep.  The iliotibial band and gluteal fascia were then reapproximated using a combination of #1 Vicryl and 0 V-Loc.  The remainder of the wound was closed with a 2-0 Vicryl, and staples on the skin.  The skin was cleaned, dried and dressed sterilely using a Mepilex dressing,  drain site dressed separately.  We will allow Ms. Torgeson to be weightbearing as tolerated with a walker.  We will probably change her dressing on day 2 to an Aquacel dressing and we will see how she progresses in the postoperative period.     Madlyn Frankel Charlann Boxer, M.D.     MDO/MEDQ  D:  03/10/2012  T:  03/11/2012  Job:  [id number]

## 2012-03-11 NOTE — Progress Notes (Signed)
Physical Therapy Treatment Patient Details Name: [name] MRN: [id number] DOB: Aug 31, 1956 Today's Date: 03/11/2012 Time: 4098-1191 PT Time Calculation (min): 23 min  PT Assessment / Plan / Recommendation Comments on Treatment Session  pt continues to progress. pt hopeful to DC home tomorrow.    Follow Up Recommendations  Home health PT;Supervision/Assistance - 24 hour    Barriers to Discharge        Equipment Recommendations  None recommended by PT    Recommendations for Other Services    Frequency 7X/week   Plan      Precautions / Restrictions Precautions Precautions: Posterior Hip Restrictions Weight Bearing Restrictions: No   Pertinent Vitals/Pain 6/10 R hip HR115    Mobility  Bed Mobility Bed Mobility: Supine to Sit Supine to Sit: 6: Modified independent (Device/Increase time) Details for Bed Mobility Assistance: pt requires no assistance with bed mobility Transfers Transfers: Sit to Stand;Stand to Sit Sit to Stand: 5: Supervision;From bed;From chair/3-in-1;With upper extremity assist Stand to Sit: 5: Supervision;To chair/3-in-1;With upper extremity assist;With armrests Details for Transfer Assistance: vc for precaution review  Ambulation/Gait Ambulation/Gait Assistance: 4: Min guard Ambulation Distance (Feet): 175 Feet Assistive device: Rolling walker Ambulation/Gait Assistance Details: pt felt a little weak but did not get dizzy. Gait Pattern: Step-through pattern    Exercises  pt reports she had been doing heel slides.   PT Diagnosis: Difficulty walking;Acute pain  PT Problem List: Pain;Decreased mobility;Decreased range of motion PT Treatment Interventions: DME instruction;Gait training;Stair training;Functional mobility training;Therapeutic activities;Therapeutic exercise;Patient/family education   PT Goals Acute Rehab PT Goals PT Goal Formulation: With patient Time For Goal Achievement: 03/18/12 Potential to Achieve Goals: Good Pt will go  Supine/Side to Sit: Independently PT Goal: Supine/Side to Sit - Progress: Progressing toward goal Pt will go Sit to Supine/Side: Independently PT Goal: Sit to Supine/Side - Progress: Goal set today Pt will go Sit to Stand: with modified independence PT Goal: Sit to Stand - Progress: Progressing toward goal Pt will go Stand to Sit: with modified independence PT Goal: Stand to Sit - Progress: Progressing toward goal Pt will Ambulate: 51 - 150 feet PT Goal: Ambulate - Progress: Progressing toward goal Pt will Go Up / Down Stairs: 1-2 stairs;with min assist;with least restrictive assistive device PT Goal: Up/Down Stairs - Progress: Goal set today  Visit Information  Last PT Received On: 03/11/12 Assistance Needed: +1    Subjective Data  Subjective: walking helps Patient Stated Goal: to go home tomorrow.   Cognition  Overall Cognitive Status: Appears within functional limits for tasks assessed/performed Arousal/Alertness: Awake/alert Orientation Level: Appears intact for tasks assessed Behavior During Session: [facility] for tasks performed    Balance     End of Session PT - End of Session Activity Tolerance: Patient tolerated treatment well Patient left: in chair;with call bell/phone within reach;with family/visitor present Nurse Communication: Mobility status    Rada Hay 03/11/2012, 4:55 PM

## 2012-03-12 LAB — BASIC METABOLIC PANEL
CO2: 26 mEq/L (ref 19–32)
Calcium: 8.7 mg/dL (ref 8.4–10.5)
Creatinine, Ser: 0.64 mg/dL (ref 0.50–1.10)
GFR calc Af Amer: >90 mL/min (ref >90)
GFR calc non Af Amer: >90 mL/min (ref >90)
Sodium: 135 mEq/L (ref 135–145)

## 2012-03-12 LAB — CBC
MCH: 29.7 pg (ref 26.0–34.0)
MCHC: 34.6 g/dL (ref 30.0–36.0)
MCV: 85.7 fL (ref 78.0–100.0)
Platelets: 143 10*3/uL — ABNORMAL LOW (ref 150–400)
RDW: 14.4 % (ref 11.5–15.5)

## 2012-03-12 MED ORDER — DIPHENHYDRAMINE HCL 25 MG PO CAPS: 25.0000 mg | ORAL_CAPSULE | Freq: Four times a day (QID) | ORAL | Status: DC | PRN

## 2012-03-12 MED ORDER — ENOXAPARIN SODIUM 40 MG/0.4ML ~~LOC~~ SOLN: 40.0000 mg | SUBCUTANEOUS | Status: DC

## 2012-03-12 MED ORDER — DSS 100 MG PO CAPS: 100.0000 mg | ORAL_CAPSULE | Freq: Two times a day (BID) | ORAL | Status: AC

## 2012-03-12 MED ORDER — METHOCARBAMOL 500 MG PO TABS: 500.0000 mg | ORAL_TABLET | Freq: Four times a day (QID) | ORAL | Status: AC | PRN

## 2012-03-12 MED ORDER — POLYETHYLENE GLYCOL 3350 17 G PO PACK: 17.0000 g | PACK | Freq: Two times a day (BID) | ORAL | Status: AC

## 2012-03-12 MED ORDER — ASPIRIN EC 325 MG PO TBEC: 325.0000 mg | DELAYED_RELEASE_TABLET | Freq: Two times a day (BID) | ORAL | Status: AC

## 2012-03-12 MED ORDER — FERROUS SULFATE 325 (65 FE) MG PO TABS: 325.0000 mg | ORAL_TABLET | Freq: Three times a day (TID) | ORAL | Status: AC

## 2012-03-12 MED ORDER — ACETAMINOPHEN 325 MG PO TABS: 325.0000 mg | ORAL_TABLET | Freq: Four times a day (QID) | ORAL | Status: AC | PRN

## 2012-03-12 MED ORDER — TRAMADOL HCL 50 MG PO TABS: 50.0000 mg | ORAL_TABLET | Freq: Four times a day (QID) | ORAL | Status: AC | PRN

## 2012-03-12 NOTE — Progress Notes (Signed)
Physical Therapy Treatment Patient Details Name: [name] MRN: [id number] DOB: August 02, 1956 Today's Date: 03/12/2012 Time: 4098-1191 PT Time Calculation (min): 23 min  PT Assessment / Plan / Recommendation Comments on Treatment Session  pt is now Mod, I w/ mobility and ambulation. for DC    Follow Up Recommendations  Home health PT;Supervision/Assistance - 24 hour    Barriers to Discharge        Equipment Recommendations  None recommended by PT    Recommendations for Other Services    Frequency     Plan Discharge plan remains appropriate    Precautions / Restrictions Precautions Precautions: Posterior Hip Restrictions Weight Bearing Restrictions: No   Pertinent Vitals/Pain 5/10 L hip   Mobility  Transfers Transfers: Sit to Stand;Stand to Sit Sit to Stand: 6: Modified independent (Device/Increase time);From chair/3-in-1 Stand to Sit: 6: Modified independent (Device/Increase time);To chair/3-in-1;With upper extremity assist Details for Transfer Assistance: pt is up ad lib w RW. Ambulation/Gait Ambulation/Gait Assistance: 6: Modified independent (Device/Increase time) Ambulation Distance (Feet): 400 Feet Assistive device: Rolling walker Ambulation/Gait Assistance Details: pt w/ no weakness. Per pt, up adlib w/ RW. Gait Pattern: Step-through pattern Stairs: Yes Stairs Assistance: 4: Min assist Stairs Assistance Details (indicate cue type and reason): pt has a ramp but wanted to practice  Steps. VC on safe technique Stair Management Technique: One rail Left;Forwards;With cane Number of Stairs: 4     Exercises     PT Diagnosis:    PT Problem List:   PT Treatment Interventions:     PT Goals Acute Rehab PT Goals Pt will go Sit to Stand: with modified independence PT Goal: Sit to Stand - Progress: Met Pt will go Stand to Sit: with modified independence PT Goal: Stand to Sit - Progress: Met Pt will Ambulate: 51 - 150 feet;with rolling walker;with supervision PT  Goal: Ambulate - Progress: Met Pt will Go Up / Down Stairs: 1-2 stairs;with min assist;with least restrictive assistive device PT Goal: Up/Down Stairs - Progress: Met  Visit Information  Last PT Received On: 03/12/12 Assistance Needed: +1    Subjective Data  Subjective: I am ready to go home   Cognition  Overall Cognitive Status: Appears within functional limits for tasks assessed/performed    Balance     End of Session PT - End of Session Activity Tolerance: Patient tolerated treatment well Patient left: in chair;with call bell/phone within reach Nurse Communication:  (pt is ready for DC)    Rada Hay 03/12/2012, 10:42 AM  2

## 2012-03-12 NOTE — Progress Notes (Signed)
D/c instructions given ad explained to pt.  Prescription for ultram, robaxin, and lovenox given to pt.  Iv site d/c, pressure dressing applied to site.  Pt alert and stable and ready for d/c.  Pt d/c home.

## 2012-03-12 NOTE — Discharge Summary (Signed)
Physician Discharge Summary  Patient ID: [name] MRN: [id number] DOB/AGE: 1956/05/08 56 y.o.  Admit date: 03/10/2012 Discharge date: 03/12/2012  Procedures:  Procedure(s) (LRB): TOTAL HIP REVISION (Right)  Attending Physician:  Dr. 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  Depressions.  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 The surgery risks, benefits, and aftercare were discussed with the patient. Questions invited and answered. Note that the patient's medical doctor is Dr. Abner Greenspan. She is planning on going home after surgery. She is not a candidate for tranexamic acid and she is given her home medications of aspirin, Robaxin, iron, MiraLax, and Colace here in the office today.   PCP: Abner Greenspan, MD, MD   Discharged Condition: good  Hospital Course:  Patient underwent the above stated procedure on 03/10/2012. Patient tolerated the procedure well and brought to the recovery room in good condition and subsequently to the floor.  POD #1 BP: 108/65 ; Pulse: 76 ; Temp: 99.8 F (37.7 C) ; Resp: 16  Pt's foley was removed, as well as the hemovac drain removed. IV was changed to a saline lock. Patient reports pain as mild, pain well controlled. C/O HA and would like to decrease pain medicine and be able to take APAP for her HA. No other events throughout the night. Neurovascular intact, dorsiflexion/plantar flexion intact, incision: dressing C/D/I, no cellulitis present and compartment soft.   LABS  Basename  03/11/12 0415   HGB  8.7  HCT  25.9   POD #2  BP: 101/66 ; Pulse: 96 ; Temp: 98.2 F (36.8 C) ; Resp: 16   Patient reports pain as mild, pain well controlled. No events throughout the night. Ready to be discharged home. Neurovascular intact, dorsiflexion/plantar flexion intact, incision: dressing C/D/I, no cellulitis present and compartment soft.   LABS  Basename  03/12/12 0350   HGB  8.1  HCT  23.4    Discharge Exam: General appearance: alert, cooperative and no distress Extremities: Homans sign is negative, no sign of DVT, no edema, redness or tenderness in the calves or thighs and no ulcers, gangrene or trophic changes  Disposition: Home  with follow up in 2 weeks  Follow-up Information    Follow up with OLIN,Pearlena Ow D in 2 weeks.   Contact information:   [facility] [address] [place] [address] [phone]          Discharge Orders    Future Orders Please Complete By Expires   Diet - low sodium heart healthy      Call MD / Call 911      Comments:   If you experience chest pain or shortness of breath, CALL 911 and be transported to the hospital emergency room.  If you develope a fever above 101 F, pus (white drainage) or increased drainage or redness at the wound, or calf pain, call your surgeon's office.   Discharge instructions      Comments:   Maintain surgical dressing for 8 days, then replace with gauze and tape. Keep the area dry and clean until follow up. Follow up in 2 weeks at [facility]. Call with any questions or concerns.  Constipation Prevention      Comments:   Drink plenty of fluids.  Prune juice may be helpful.  You may use a stool softener, such as Colace (over the counter) 100 mg twice a day.  Use MiraLax (over the counter) for constipation as needed.   Increase activity slowly as tolerated      Driving restrictions      Comments:   No driving for 4 weeks   Change dressing      Comments:   Maintain surgical dressing for 8 days, then replace with 4x4 guaze and tape. Keep the area dry and  clean.   TED hose      Comments:   Use stockings (TED hose) for 2 weeks on both leg(s).  You may remove them at night for sleeping.      Current Discharge Medication List    START taking these medications   Details  aspirin EC 325 MG tablet Take 1 tablet (325 mg total) by mouth 2 (two) times daily. X 4 weeks Qty: 60 tablet, Refills: 0   Comments: Start after finishing Lovenox    diphenhydrAMINE (BENADRYL) 25 mg capsule Take 1 capsule (25 mg total) by mouth every 6 (six) hours as needed for itching, allergies or sleep.    docusate sodium 100 MG CAPS Take 100 mg by mouth 2 (two) times daily.    enoxaparin (LOVENOX) 40 MG/0.4ML injection Inject 0.4 mLs (40 mg total) into the skin daily. Qty: 14 Syringe, Refills: 0   Comments: After finishing the Lovenox, start aspirin 325 mg twice a day.    ferrous sulfate 325 (65 FE) MG tablet Take 1 tablet (325 mg total) by mouth 3 (three) times daily after meals.    methocarbamol (ROBAXIN) 500 MG tablet Take 1 tablet (500 mg total) by mouth every 6 (six) hours as needed (muscle spasms). Qty: 50 tablet, Refills: 0    polyethylene glycol (MIRALAX / GLYCOLAX) packet Take 17 g by mouth 2 (two) times daily.    traMADol (ULTRAM) 50 MG tablet Take 1-2 tablets (50-100 mg total) by mouth every 6 (six) hours as needed for pain. Qty: 100 tablet, Refills: 0      CONTINUE these medications which have CHANGED   Details  acetaminophen (TYLENOL) 325 MG tablet Take 1-2 tablets (325-650 mg total) by mouth every 6 (six) hours as needed (headache).      CONTINUE these medications which have NOT CHANGED   Details  DULoxetine (CYMBALTA) 60 MG capsule Take 60 mg by mouth at bedtime.           Signed: Anastasio Auerbach. Arley Garant   PAC  03/12/2012, 10:26 AM

## 2012-03-12 NOTE — Progress Notes (Signed)
Subjective: 2 Days Post-Op Procedure(s) (LRB): TOTAL HIP REVISION (Right)   Patient reports pain as mild, pain well controlled. No events throughout the night. Ready to be discharged home.  Objective:   VITALS:   Filed Vitals:   03/12/12 0618  BP: 101/66  Pulse: 96  Temp: 98.2 F (36.8 C)  Resp: 16    Neurovascular intact Dorsiflexion/Plantar flexion intact Incision: dressing C/D/I No cellulitis present Compartment soft  LABS  Basename 03/12/12 0350 03/11/12 0415  HGB 8.1* 8.7*  HCT 23.4* 25.9*  WBC 9.1 6.9  PLT 143* 150     Basename 03/12/12 0350 03/11/12 0415  NA 135 138  K 3.9 3.5  BUN 10 6  CREATININE 0.64 0.66  GLUCOSE 179* 142*     Assessment/Plan: 2 Days Post-Op Procedure(s) (LRB): TOTAL HIP REVISION (Right)   Up with therapy Discharge home with home health after PT x 1 Follow up in 2 weeks at [facility] [place].  Follow-up Information    Follow up with OLIN,Kajuana Shareef D in 2 weeks.   Contact information:   [facility] [address], [address] [place] [address] [phone]          [name]. [name]   PAC  03/12/2012, 10:04 AM

## 2012-09-24 IMAGING — CR DG HIP 1V PORT*R*
1 series · 1 of 1 positions shown · non-contrast
Comparison: Plain film 11/23/2011.

CLINICAL DATA: Revision of hip replacement.

PORTABLE RIGHT HIP - 1 VIEW

[AP]
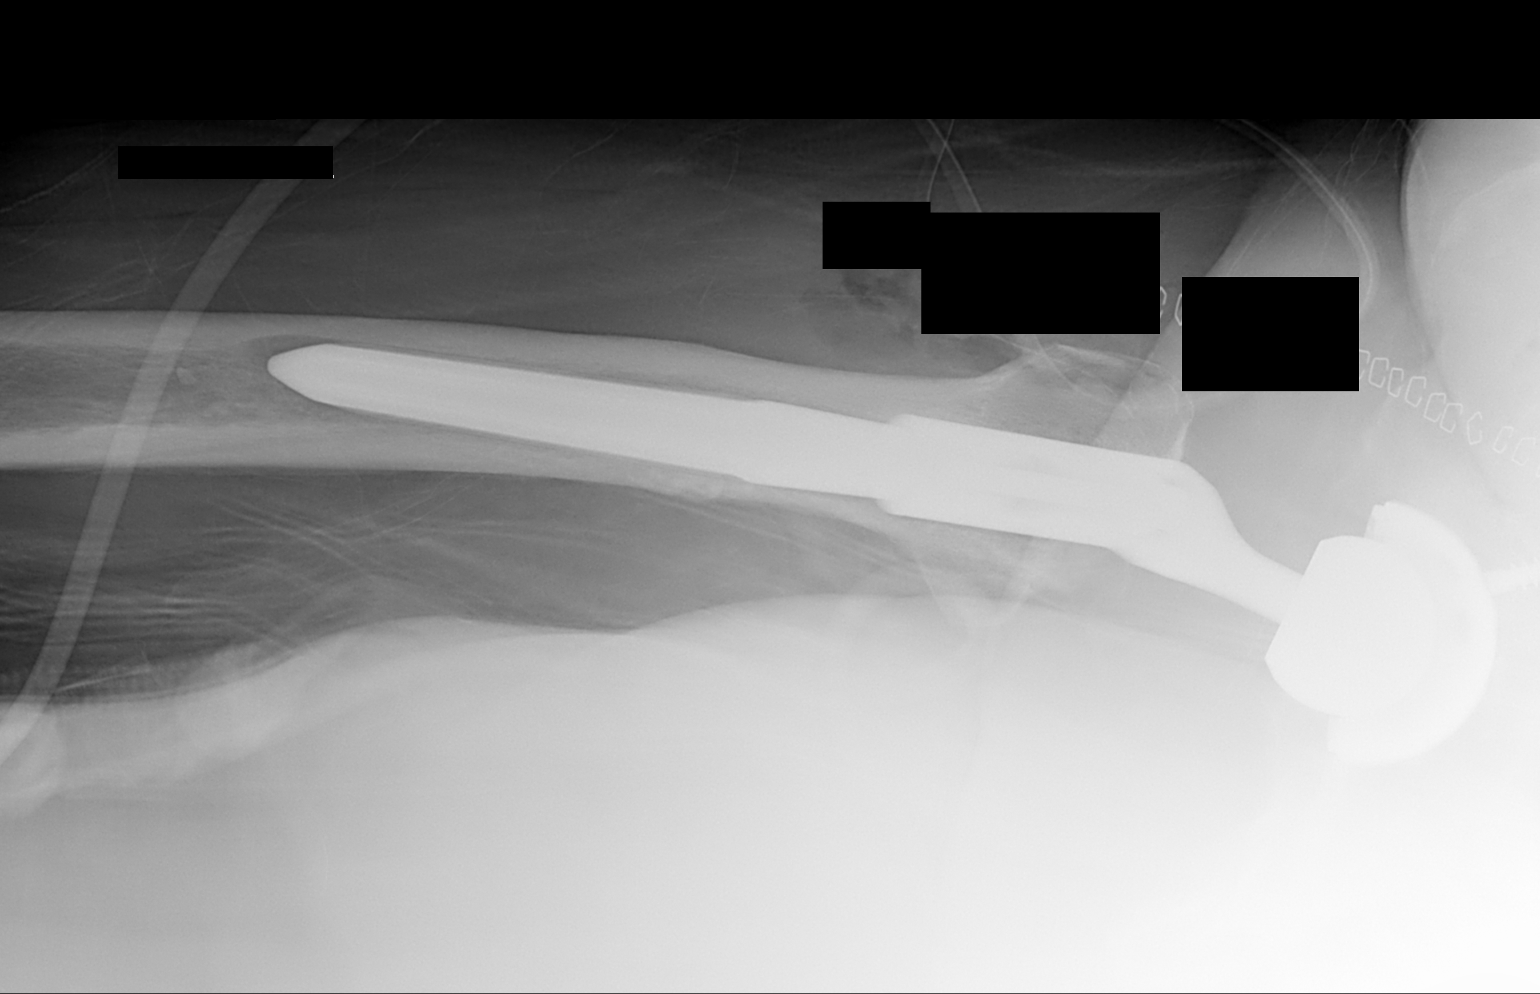

[1 of 1 positions shown; findings below may reference images not displayed]

FINDINGS: New right total hip replacement is in place.  The device
is located and no fracture is identified.  Surgical staples and
drain noted.
IMPRESSION: Right total hip without evidence of complication.

## 2014-08-18 DIAGNOSIS — G40109 Localization-related (focal) (partial) symptomatic epilepsy and epileptic syndromes with simple partial seizures, not intractable, without status epilepticus: Secondary | ICD-10-CM | POA: Insufficient documentation

## 2014-08-18 HISTORY — DX: Localization-related (focal) (partial) symptomatic epilepsy and epileptic syndromes with simple partial seizures, not intractable, without status epilepticus: G40.109

## 2015-10-13 DIAGNOSIS — R5383 Other fatigue: Secondary | ICD-10-CM

## 2015-10-13 DIAGNOSIS — Z6841 Body Mass Index (BMI) 40.0 and over, adult: Secondary | ICD-10-CM

## 2015-10-13 HISTORY — DX: Other fatigue: R53.83

## 2015-10-13 HISTORY — DX: Morbid (severe) obesity due to excess calories: E66.01

## 2015-11-22 DIAGNOSIS — R0683 Snoring: Secondary | ICD-10-CM

## 2015-11-22 HISTORY — DX: Snoring: R06.83

## 2015-11-30 DIAGNOSIS — N905 Atrophy of vulva: Secondary | ICD-10-CM

## 2015-11-30 DIAGNOSIS — N904 Leukoplakia of vulva: Secondary | ICD-10-CM | POA: Insufficient documentation

## 2015-11-30 DIAGNOSIS — K602 Anal fissure, unspecified: Secondary | ICD-10-CM

## 2015-11-30 HISTORY — DX: Anal fissure, unspecified: K60.2

## 2015-11-30 HISTORY — DX: Atrophy of vulva: N90.5

## 2015-11-30 HISTORY — DX: Leukoplakia of vulva: N90.4

## 2016-04-09 DIAGNOSIS — R0789 Other chest pain: Secondary | ICD-10-CM | POA: Insufficient documentation

## 2016-04-09 DIAGNOSIS — R079 Chest pain, unspecified: Secondary | ICD-10-CM

## 2016-04-09 HISTORY — DX: Other chest pain: R07.89

## 2016-06-20 ENCOUNTER — Ambulatory Visit (INDEPENDENT_AMBULATORY_CARE_PROVIDER_SITE_OTHER): Payer: BC Managed Care – PPO | Admitting: Sports Medicine

## 2016-06-20 ENCOUNTER — Encounter (INDEPENDENT_AMBULATORY_CARE_PROVIDER_SITE_OTHER): Payer: Self-pay

## 2016-06-20 ENCOUNTER — Ambulatory Visit (INDEPENDENT_AMBULATORY_CARE_PROVIDER_SITE_OTHER): Payer: BC Managed Care – PPO

## 2016-06-20 ENCOUNTER — Encounter: Payer: Self-pay | Admitting: Sports Medicine

## 2016-06-20 DIAGNOSIS — M7662 Achilles tendinitis, left leg: Secondary | ICD-10-CM

## 2016-06-20 DIAGNOSIS — M7661 Achilles tendinitis, right leg: Secondary | ICD-10-CM

## 2016-06-20 DIAGNOSIS — M79672 Pain in left foot: Secondary | ICD-10-CM

## 2016-06-20 MED ORDER — METHYLPREDNISOLONE 4 MG PO TBPK: ORAL_TABLET | ORAL | 0 refills | Status: DC

## 2016-06-20 MED ORDER — DICLOFENAC SODIUM 75 MG PO TBEC: 75.0000 mg | DELAYED_RELEASE_TABLET | Freq: Two times a day (BID) | ORAL | 0 refills | Status: DC

## 2016-06-20 NOTE — Patient Instructions (Signed)
Achilles Tendinitis Achilles tendinitis is inflammation of the tough, cord-like band that attaches the lower muscles of your leg to your heel (Achilles tendon). It is usually caused by overusing the tendon and joint involved.  CAUSES Achilles tendinitis can happen because of:  A sudden increase in exercise or activity (such as running).  Doing the same exercises or activities (such as jumping) over and over.  Not warming up calf muscles before exercising.  Exercising in shoes that are worn out or not made for exercise.  Having arthritis or a bone growth on the back of the heel bone. This can rub against the tendon and hurt the tendon. SIGNS AND SYMPTOMS The most common symptoms are:  Pain in the back of the leg, just above the heel. The pain usually gets worse with exercise and better with rest.  Stiffness or soreness in the back of the leg, especially in the morning.  Swelling of the skin over the Achilles tendon.  Trouble standing on tiptoe. Sometimes, an Achilles tendon tears (ruptures). Symptoms of an Achilles tendon rupture can include:  Sudden, severe pain in the back of the leg.  Trouble putting weight on the foot or walking normally. DIAGNOSIS Achilles tendinitis will be diagnosed based on symptoms and a physical examination. An X-ray may be done to check if another condition is causing your symptoms. An MRI may be ordered if your health care provider suspects you may have completely torn your tendon, which is called an Achilles tendon rupture.  TREATMENT  Achilles tendinitis usually gets better over time. It can take weeks to months to heal completely. Treatment focuses on treating the symptoms and helping the injury heal. HOME CARE INSTRUCTIONS   Rest your Achilles tendon and avoid activities that cause pain.  Apply ice to the injured area:  Put ice in a plastic bag.  Place a towel between your skin and the bag.  Leave the ice on for 20 minutes, 2-3 times a  day  Try to avoid using the tendon (other than gentle range of motion) while the tendon is painful. Do not resume use until instructed by your health care provider. Then begin use gradually. Do not increase use to the point of pain. If pain does develop, decrease use and continue the above measures. Gradually increase activities that do not cause discomfort until you achieve normal use.  Do exercises to make your calf muscles stronger and more flexible. Your health care provider or physical therapist can recommend exercises for you to do.  Wrap your ankle with an elastic bandage or other wrap. This can help keep your tendon from moving too much. Your health care provider will show you how to wrap your ankle correctly.  Only take over-the-counter or prescription medicines for pain, discomfort, or fever as directed by your health care provider. SEEK MEDICAL CARE IF:   Your pain and swelling increase or pain is uncontrolled with medicines.  You develop new, unexplained symptoms or your symptoms get worse.  You are unable to move your toes or foot.  You develop warmth and swelling in your foot.  You have an unexplained temperature. MAKE SURE YOU:   Understand these instructions.  Will watch your condition.  Will get help right away if you are not doing well or get worse.   This information is not intended to replace advice given to you by your health care provider. Make sure you discuss any questions you have with your health care provider.   Document Released:   07/18/2005 Document Revised: 10/29/2014 Document Reviewed: 05/20/2013 Elsevier Interactive Patient Education 2016 [employer]  

## 2016-06-20 NOTE — Progress Notes (Signed)
Subjective: [name] is a 60 y.o. female patient who presents to office for evaluation of Left>right heel pain. Patient complains of progressive pain especially over the last year in the Left>right foot at the Achilles. Patient has tried PT and massage which helped at the time and icing and motrin with minor relief in symptoms. Patient denies any other pedal complaints.   Patient Active Problem List   Diagnosis Date Noted  . Chest pain 04/09/2016  . Anal fissure 11/30/2015  . Lichen sclerosus et atrophicus of the vulva 11/30/2015  . 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 Snoring 11/22/2015  . Fatigue 10/13/2015  . Morbid obesity with BMI of 45.0-49.9, adult ([place]) 10/13/2015  . 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 History of operative procedure on hip 03/10/2012  . 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 Methicillin susceptible Staphylococcus aureus infection 03/09/2011  . Carpal tunnel syndrome 03/09/2011  . History of immune thrombocytopenia 03/09/2011    Current Outpatient Prescriptions on File Prior to Visit  Medication Sig Dispense Refill  . diphenhydrAMINE (BENADRYL) 25 mg capsule Take 1 capsule (25 mg total) by mouth every 6 (six) hours as needed for itching, allergies or sleep.    . diphenhydrAMINE (BENADRYL) 25 mg capsule Take 1 capsule (25 mg total) by mouth every 6 (six) hours as needed for itching, allergies or sleep.    . DULoxetine (CYMBALTA) 60 MG capsule Take 60 mg by mouth at bedtime.     . enoxaparin (LOVENOX) 40 MG/0.4ML injection Inject 0.4 mLs (40 mg total) into the skin daily. 14 Syringe 0   No current facility-administered medications on file prior to visit.     Allergies  Allergen Reactions  . Latex Other (See Comments)    blisters blisters    Objective:  General: Alert and oriented x3 in no acute  distress  Dermatology: No open lesions bilateral lower extremities, no webspace macerations, no ecchymosis bilateral, all nails x 10 are well manicured.  Vascular: Dorsalis Pedis and Posterior Tibial pedal pulses 1/4, Capillary Fill Time 3 seconds, + pedal hair growth bilateral, no edema bilateral lower extremities, Temperature gradient within normal limits.  Neurology: Johney Maine sensation intact via light touch bilateral, Protective sensation intact with Thornell Mule Monofilament to all pedal sites, No babinski sign present bilateral. - Tinels sign bilateral.   Musculoskeletal: Mild tenderness with palpation at insertion of the Achilles on Left>Right, there is calcaneal exostosis with mild soft tissue fullness present and decreased ankle rom with knee extending  vs flexed resembling gastroc equnius bilateral, The achilles tendon feels intact with no nodularity or palpable dell, Thompson sign negative, Subtalar and midtarsal joint range of motion is within normal limits, there is no 1st ray hypermobility or mild asymptomatic hammertoe forefoot deformity noted bilateral.   Xrays  Left Foot    Impression: Normal osseous mineralization. Joint spaces preserved. No fracture/dislocation/boney destruction. Hammertoe, Calcaneal spur present. Kager's triangle intact with no obliteration. No soft tissue abnormalities or radiopaque foreign bodies.   Assessment and Plan: Problem List Items Addressed This Visit    None    Visit Diagnoses    Left foot pain    -  Primary   Relevant Medications   methylPREDNISolone (MEDROL DOSEPAK) 4 MG TBPK tablet   diclofenac (VOLTAREN) 75 MG EC tablet   Other Relevant Orders   DG Foot 2 Views Left   Achilles  tendinitis of both lower extremities       L>R   Relevant Medications   methylPREDNISolone (MEDROL DOSEPAK) 4 MG TBPK tablet   diclofenac (VOLTAREN) 75 MG EC tablet      -Complete examination performed -Xrays reviewed -Discussed treatement options for  achilles tendonitis -Rx Medrol dose pack and voltaren to start once completed,  Recommend daily icing and gentle stretching with night splint -No improvement will consider Heel lifts/MRI/PT/EPAT -Patient to return to office in 2-3 weeks or sooner if condition worsens.  Landis Martins, DPM

## 2016-07-04 ENCOUNTER — Ambulatory Visit (INDEPENDENT_AMBULATORY_CARE_PROVIDER_SITE_OTHER): Payer: BC Managed Care – PPO

## 2016-07-04 ENCOUNTER — Encounter: Payer: Self-pay | Admitting: Sports Medicine

## 2016-07-04 ENCOUNTER — Ambulatory Visit (INDEPENDENT_AMBULATORY_CARE_PROVIDER_SITE_OTHER): Payer: BC Managed Care – PPO | Admitting: Sports Medicine

## 2016-07-04 VITALS — Ht 66.0 in | Wt 279.0 lb

## 2016-07-04 DIAGNOSIS — M7661 Achilles tendinitis, right leg: Secondary | ICD-10-CM

## 2016-07-04 DIAGNOSIS — M7662 Achilles tendinitis, left leg: Secondary | ICD-10-CM | POA: Diagnosis not present

## 2016-07-04 DIAGNOSIS — M79672 Pain in left foot: Secondary | ICD-10-CM | POA: Diagnosis not present

## 2016-07-04 DIAGNOSIS — M79671 Pain in right foot: Secondary | ICD-10-CM

## 2016-07-04 NOTE — Progress Notes (Addendum)
Subjective: [name] is a 60 y.o. female patient who returns to office for follow up evaluation of Left>right heel pain at achilles. Patient states that night splint helps but meds made no difference. Patient denies any other pedal complaints.   Patient Active Problem List   Diagnosis Date Noted  . Chest pain 04/09/2016  . Anal fissure 11/30/2015  . Lichen sclerosus et atrophicus of the vulva 11/30/2015  . 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 Snoring 11/22/2015  . Fatigue 10/13/2015  . Morbid obesity with BMI of 45.0-49.9, adult ([place]) 10/13/2015  . 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 History of operative procedure on hip 03/10/2012  . 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 Methicillin susceptible Staphylococcus aureus infection 03/09/2011  . Carpal tunnel syndrome 03/09/2011  . History of immune thrombocytopenia 03/09/2011    Current Outpatient Prescriptions on File Prior to Visit  Medication Sig Dispense Refill  . diclofenac (VOLTAREN) 75 MG EC tablet Take 1 tablet (75 mg total) by mouth 2 (two) times daily. 30 tablet 0  . diphenhydrAMINE (BENADRYL) 25 mg capsule Take 1 capsule (25 mg total) by mouth every 6 (six) hours as needed for itching, allergies or sleep.    . diphenhydrAMINE (BENADRYL) 25 mg capsule Take 1 capsule (25 mg total) by mouth every 6 (six) hours as needed for itching, allergies or sleep.    . DULoxetine (CYMBALTA) 60 MG capsule Take 60 mg by mouth at bedtime.     . enoxaparin (LOVENOX) 40 MG/0.4ML injection Inject 0.4 mLs (40 mg total) into the skin daily. 14 Syringe 0  . methylPREDNISolone (MEDROL DOSEPAK) 4 MG TBPK tablet Take 1st as instructed 21 tablet 0   No current facility-administered medications on file prior to visit.     Allergies  Allergen Reactions  . Latex Other (See Comments)    blisters blisters     Objective:  General: Alert and oriented x3 in no acute distress  Dermatology: No open lesions bilateral lower extremities, no webspace macerations, no ecchymosis bilateral, all nails x 10 are well manicured.  Vascular: Dorsalis Pedis and Posterior Tibial pedal pulses 1/4, Capillary Fill Time 3 seconds, + pedal hair growth bilateral, no edema bilateral lower extremities, Temperature gradient within normal limits.  Neurology: Johney Maine sensation intact via light touch bilateral, Protective sensation intact with Thornell Mule Monofilament to all pedal sites, No babinski sign present bilateral. - Tinels sign bilateral.   Musculoskeletal: Mild tenderness with palpation at insertion of the Achilles on Left>Right, there is calcaneal exostosis with mild soft tissue fullness present and decreased ankle rom with knee extending  vs flexed resembling gastroc equnius bilateral, The achilles tendon feels intact with no nodularity or palpable dell, Thompson sign negative, Subtalar and midtarsal joint range of motion is within normal limits, there is no 1st ray hypermobility or mild asymptomatic hammertoe forefoot deformity noted bilateral.   Xray, Right Foot- Normal mineralization, Calcaneal heel spurs with retrocalcaneal exostosis, No acute fracture or dislocation, Soft tissue margins intact.   Assessment and Plan: Problem List Items Addressed This Visit    None    Visit Diagnoses    Right foot pain    -  Primary   Relevant Orders   DG Foot 2 Views Right   Left foot pain       Achilles tendinitis of both lower extremities         -  Complete examination performed -Previous Xrays reviewed; obtained new xray of right today since no recent xray on file -Discussed treatement options for achilles tendonitis -Dispensed heel lifts,  Recommend  Continue daily icing and gentle stretching with night splint, Rx PT at [facility]; this is the second attempt to PT (last time was 2 years ago). Rx Voltaren gel to  use with Ultrasound treatments at heels during PT sessions. -No improvement will consider EPAT. Patient does not want surgery at this time.  -Patient to return to office in 4 weeks or sooner if condition worsens.  Landis Martins, DPM

## 2016-07-06 ENCOUNTER — Other Ambulatory Visit: Payer: Self-pay

## 2016-07-06 MED ORDER — DICLOFENAC SODIUM 1 % TD GEL: 2.0000 g | Freq: Four times a day (QID) | TRANSDERMAL | 2 refills | Status: DC

## 2016-07-06 MED ORDER — DICLOFENAC SODIUM 3 % TD GEL: 1.0000 g | Freq: Three times a day (TID) | TRANSDERMAL | 2 refills | Status: DC

## 2016-08-03 ENCOUNTER — Ambulatory Visit: Payer: BC Managed Care – PPO | Admitting: Sports Medicine

## 2018-03-19 ENCOUNTER — Other Ambulatory Visit: Payer: Self-pay | Admitting: Sports Medicine

## 2018-03-19 ENCOUNTER — Encounter

## 2018-03-19 ENCOUNTER — Encounter: Payer: Self-pay | Admitting: Sports Medicine

## 2018-03-19 ENCOUNTER — Ambulatory Visit: Payer: BC Managed Care – PPO | Admitting: Sports Medicine

## 2018-03-19 ENCOUNTER — Ambulatory Visit (INDEPENDENT_AMBULATORY_CARE_PROVIDER_SITE_OTHER): Payer: BC Managed Care – PPO

## 2018-03-19 DIAGNOSIS — M722 Plantar fascial fibromatosis: Secondary | ICD-10-CM

## 2018-03-19 DIAGNOSIS — M7672 Peroneal tendinitis, left leg: Secondary | ICD-10-CM

## 2018-03-19 DIAGNOSIS — R2243 Localized swelling, mass and lump, lower limb, bilateral: Secondary | ICD-10-CM

## 2018-03-19 DIAGNOSIS — M25572 Pain in left ankle and joints of left foot: Secondary | ICD-10-CM | POA: Diagnosis not present

## 2018-03-19 MED ORDER — TRIAMCINOLONE ACETONIDE 40 MG/ML IJ SUSP: 20.0000 mg | Freq: Once | INTRAMUSCULAR | Status: DC

## 2018-03-19 NOTE — Progress Notes (Signed)
Subjective: [name] is a 62 y.o. female patient presents to office with complaint of moderate heel pain on the left at the bottom and lateral side for the last 7 months.  States that the pain slowly progresses throughout the day and is a stabbing type of pain at end of day typically pain is 8 out of 10.  Has tried elevating taking ibuprofen icing stretching and over-the-counter heel pads.  Patient states that the over-the-counter heel pads and making sure she wear shoes with a heel and not that are completely flat is helpful.  States that her right foot is doing fine.  Patient admits that in the past since the last 2 years I saw her that she completed physical therapy however still long-term struggles with her Achilles area flaring up occasionally and still does not want any type of surgical intervention.  Patient is assisted this visit by her husband.  Denies any other pedal complaints.   Review of Systems  Cardiovascular: Positive for leg swelling.  Musculoskeletal: Positive for joint pain.  All other systems reviewed and are negative.    Patient Active Problem List   Diagnosis Date Noted  . Chest pain 04/09/2016  . Anal fissure 11/30/2015  . Lichen sclerosus et atrophicus of the vulva 11/30/2015  . 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 Snoring 11/22/2015  . Fatigue 10/13/2015  . Morbid obesity with BMI of 45.0-49.9, adult ([place]) 10/13/2015  . 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 History of operative procedure on hip 03/10/2012  . 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 Methicillin susceptible Staphylococcus aureus infection 03/09/2011  . Carpal tunnel syndrome 03/09/2011  . History of immune thrombocytopenia 03/09/2011    Current Outpatient Medications on File Prior to Visit  Medication Sig Dispense Refill  . diclofenac (VOLTAREN) 75 MG EC  tablet Take 1 tablet (75 mg total) by mouth 2 (two) times daily. 30 tablet 0  . diclofenac sodium (VOLTAREN) 1 % GEL Apply 2 g topically 4 (four) times daily. 300 g 2  . DULoxetine (CYMBALTA) 60 MG capsule Take 60 mg by mouth at bedtime.     . enoxaparin (LOVENOX) 40 MG/0.4ML injection Inject 0.4 mLs (40 mg total) into the skin daily. 14 Syringe 0  . methylPREDNISolone (MEDROL DOSEPAK) 4 MG TBPK tablet Take 1st as instructed 21 tablet 0  . diphenhydrAMINE (BENADRYL) 25 mg capsule Take 1 capsule (25 mg total) by mouth every 6 (six) hours as needed for itching, allergies or sleep.    . diphenhydrAMINE (BENADRYL) 25 mg capsule Take 1 capsule (25 mg total) by mouth every 6 (six) hours as needed for itching, allergies or sleep.     No current facility-administered medications on file prior to visit.     Allergies  Allergen Reactions  . Latex Other (See Comments)    blisters blisters    Objective: Physical Exam General: The patient is alert and oriented x3 in no acute distress.  Dermatology: Skin is warm, dry and supple bilateral lower extremities. Nails 1-10 are asymptomatic.  There is hyperpigmentation noted to both lower extremities suggestive of venous stasis with severe varicosities. Integument is otherwise unremarkable.  Vascular: Dorsalis Pedis pulse and Posterior Tibial pulse not able to be palpated due to 1+ pitting edema bilateral. Capillary fill time is immediate to all digits.  Neurological: Grossly intact to light touch with an achilles reflex  of +2/5 and a  negative Tinel's sign bilateral.  Musculoskeletal: Tenderness to palpation at the lateral greater than medial calcaneal tubercale and through the insertion of the plantar fascia on the left and along the peroneal tendon course on the left.  Mild chronic pain to Achilles insertion left greater than right.  No pain with compression of calcaneus bilateral. No pain with tuning fork to calcaneus bilateral. No pain with calf  compression bilateral. There is decreased Ankle joint range of motion bilateral. All other joints range of motion within normal limits bilateral however there is mild guarding with inversion of left foot and ankle. Strength 5/5 in all groups bilateral.   Gait: Unassisted, Antalgic avoid weight on left/right heel  Xray, left foot Normal osseous mineralization. Joint narrowing diffusely present with No obvious fracture/dislocation/boney destruction.  Severe calcaneal spur present posterior and inferior calcaneus with mild thickening of plantar fascia.  No obliteration of Kagers triangle noted.  No other soft tissue abnormalities or radiopaque foreign bodies.   Assessment and Plan: Problem List Items Addressed This Visit    None    Visit Diagnoses    Acute left ankle pain    -  Primary   Plantar fasciitis       Relevant Medications   triamcinolone acetonide (KENALOG-40) injection 20 mg   Peroneal tendonitis of left lower leg       Localized swelling of both lower legs          -Complete examination performed.  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 Explained both conservative and surgical treatments.  -After oral consent and aseptic prep, injected a mixture containing 1 ml of 2%  plain lidocaine, 1 ml 0.5% plain marcaine, 0.5 ml of kenalog 10 and 0.5 ml of dexamethasone phosphate into left heel along the lateral tubercle to also offer some relief to the peroneal tendon course and plantar fascia. Post-injection care discussed with patient.  -Recommended good supportive shoes and advised use of OTC insert; encouraged patient to refrain from walking barefoot. -Explained in detail the use of the fascial brace for left was dispensed at today's visit.  Recommend continue  with range of motion exercises and icing as taught in the past by physical therapy -Patient to return to office in 4 weeks for follow up or sooner if problems or questions arise.  At this visit we will discuss if patient is a candidate for EPAT treatment.  Also encourage patient to follow-up with her primary care doctor about the edema that is diffuse on both legs that is more concerning at today's visit from previous; patient expressed understanding and states that she will make an appointment with her doctor to follow this up.  Landis Martins, DPM

## 2018-03-21 ENCOUNTER — Ambulatory Visit: Payer: BC Managed Care – PPO | Admitting: Sports Medicine

## 2018-03-26 ENCOUNTER — Other Ambulatory Visit: Payer: Self-pay

## 2018-03-26 DIAGNOSIS — I872 Venous insufficiency (chronic) (peripheral): Secondary | ICD-10-CM

## 2018-04-23 ENCOUNTER — Ambulatory Visit: Payer: BC Managed Care – PPO | Admitting: Sports Medicine

## 2018-05-14 ENCOUNTER — Other Ambulatory Visit: Payer: Self-pay

## 2018-05-14 ENCOUNTER — Ambulatory Visit (HOSPITAL_COMMUNITY)
Admission: RE | Admit: 2018-05-14 | Discharge: 2018-05-14 | Disposition: A | Discharge status: Home / Self Care | Payer: BC Managed Care – PPO | Source: Ambulatory Visit | Attending: Vascular Surgery | Admitting: Vascular Surgery

## 2018-05-14 ENCOUNTER — Encounter: Payer: Self-pay | Admitting: Vascular Surgery

## 2018-05-14 ENCOUNTER — Encounter

## 2018-05-14 ENCOUNTER — Ambulatory Visit: Payer: BC Managed Care – PPO | Admitting: Vascular Surgery

## 2018-05-14 VITALS — BP 127/74 | HR 109 | Temp 97.9°F | Resp 16 | Ht 67.0 in | Wt 284.0 lb

## 2018-05-14 DIAGNOSIS — M7989 Other specified soft tissue disorders: Secondary | ICD-10-CM | POA: Insufficient documentation

## 2018-05-14 DIAGNOSIS — I872 Venous insufficiency (chronic) (peripheral): Secondary | ICD-10-CM

## 2018-05-14 DIAGNOSIS — R609 Edema, unspecified: Secondary | ICD-10-CM | POA: Diagnosis not present

## 2018-05-14 NOTE — Progress Notes (Signed)
Patient name: [name] MRN: [id number] DOB: May 02, 1956 Sex: female   REASON FOR CONSULT:    Chronic venous insufficiency.  The consult is requested by Laverna Peace, NP  HPI:   [name] is a pleasant 62 y.o. female, who is referred with chronic venous insufficiency.  I have reviewed the note from the referring office.  The patient was seen on 03/21/2018.  She was having leg pain with swelling redness and difficulty ambulating.  This had been going on for 5 months.  9 this patient describes a long history of swelling in both lower extremities and gradual progression of some varicose veins bilaterally.  She also notices aching heavy feeling in her legs which is aggravated by standing and relieved with elevation.  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  She spends a lot of time on her feet.  She is unaware of any history of DVT or phlebitis.  Her swelling is pretty much equal on both sides.  Her symptoms are more significant on the right side.  She has used ibuprofen for pain quite a bit.  She does elevate her legs which helps.  Standing significantly makes her symptoms worse.  Past Medical History:  Diagnosis Date  . Arthritis    right hip  . Blood transfusion   . Cancer [facility])    SKIN CANCER  . Depression   . Headache(784.0)    migraines  . MSSA (methicillin susceptible Staphylococcus aureus) infection   . T.T.P. syndrome ([place])    20 yrs ago-not seen anyone for 10 yrs.  . Varicose vein     Family History  Problem Relation Age of Onset  . Alzheimer's disease Father   . Prostate cancer Father   . Seizures Unknown     SOCIAL HISTORY: Social History   Socioeconomic History  . Marital status: Married    Spouse name: Not on file  . Number of children: Not on file  . Years of education: Not on file  . Highest education level: Not on file  Occupational History  . Occupation: Product manager: [employer]  Social Needs  . Financial resource strain:  Not on file  . Food insecurity:    Worry: Not on file    Inability: Not on file  . Transportation needs:    Medical: Not on file    Non-medical: Not on file  Tobacco Use  . Smoking status: Former Smoker    Packs/day: 1.50    Years: 20.00    Pack years: 30.00    Types: Cigarettes    Last attempt to quit: 10/04/1990    Years since quitting: 27.6  . Smokeless tobacco: Former Systems developer    Quit date: 03/09/1991  Substance and Sexual Activity  . Alcohol use: Yes    Comment: OCCASIONAL  . Drug use: No  . Sexual activity: Yes    Partners: Male  Lifestyle  . Physical activity:    Days per week: Not on file    Minutes per session: Not on file  . Stress: Not on file  Relationships  . Social connections:    Talks on phone: Not on file    Gets together: Not on file    Attends religious service: Not on file    Active member of club or organization: Not on file    Attends meetings of clubs or organizations: Not on file    Relationship status: Not on file  . Intimate  partner violence:    Fear of current or ex partner: Not on file    Emotionally abused: Not on file    Physically abused: Not on file    Forced sexual activity: Not on file  Other Topics Concern  . Not on file  Social History Narrative  . Not on file    Allergies  Allergen Reactions  . Latex Other (See Comments)    blisters blisters blisters    Current Outpatient Medications  Medication Sig Dispense Refill  . DULoxetine (CYMBALTA) 60 MG capsule Take 60 mg by mouth at bedtime.     . Esomeprazole Magnesium (NEXIUM PO) Take by mouth every evening.    [name] Kitchen ibuprofen (ADVIL,MOTRIN) 800 MG tablet Take 800 mg by mouth 2 (two) times daily.    . diclofenac (VOLTAREN) 75 MG EC tablet Take 1 tablet (75 mg total) by mouth 2 (two) times daily. (Patient not taking: Reported on 05/14/2018) 30 tablet 0  . diclofenac sodium (VOLTAREN) 1 % GEL Apply 2 g topically 4 (four) times daily. (Patient not taking: Reported on 05/14/2018) 300 g 2    . diphenhydrAMINE (BENADRYL) 25 mg capsule Take 1 capsule (25 mg total) by mouth every 6 (six) hours as needed for itching, allergies or sleep.    . diphenhydrAMINE (BENADRYL) 25 mg capsule Take 1 capsule (25 mg total) by mouth every 6 (six) hours as needed for itching, allergies or sleep.    [name] Kitchen enoxaparin (LOVENOX) 40 MG/0.4ML injection Inject 0.4 mLs (40 mg total) into the skin daily. (Patient not taking: Reported on 05/14/2018) 14 Syringe 0  . methylPREDNISolone (MEDROL DOSEPAK) 4 MG TBPK tablet Take 1st as instructed (Patient not taking: Reported on 05/14/2018) 21 tablet 0   Current Facility-Administered Medications  Medication Dose Route Frequency Provider Last Rate Last Dose  . triamcinolone acetonide (KENALOG-40) injection 20 mg  20 mg Other Once Landis Martins, DPM        REVIEW OF SYSTEMS:  [X]  denotes positive finding, [ ]  denotes negative finding Cardiac  Comments:  Chest pain or chest pressure:    Shortness of breath upon exertion: x   Short of breath when lying flat:    Irregular heart rhythm: x       Vascular    Pain in calf, thigh, or hip brought on by ambulation: x   Pain in feet at night that wakes you up from your sleep:  x   Blood clot in your veins:    Leg swelling:  x       Pulmonary    Oxygen at home:    Productive cough:     Wheezing:         Neurologic    Sudden weakness in arms or legs:     Sudden numbness in arms or legs:     Sudden onset of difficulty speaking or slurred speech:    Temporary loss of vision in one eye:     Problems with dizziness:         Gastrointestinal    Blood in stool:     Vomited blood:         Genitourinary    Burning when urinating:     Blood in urine:        Psychiatric    Major depression:         Hematologic    Bleeding problems:    Problems with blood clotting too easily:        Skin  Rashes or ulcers:        Constitutional    Fever or chills:     PHYSICAL EXAM:   Vitals:   05/14/18 1205  BP: 127/74   Pulse: (!) 109  Resp: 16  Temp: 97.9 F (36.6 C)  TempSrc: Oral  SpO2: 94%  Weight: 284 lb (128.8 kg)  Height: 5\' 7"  (1.702 m)   GENERAL: The patient is a well-nourished female, in no acute distress. The vital signs are documented above. CARDIAC: There is a regular rate and rhythm.  VASCULAR:  ARTERIAL: I do not detect carotid bruits.  Because of her leg swelling I cannot palpate pedal pulses however she has biphasic dorsalis pedis and posterior tibial signals bilaterally. VENOUS: On the right side the patient has significant leg swelling with hyperpigmentation of the right leg.  She has some varicose veins in the anterior right thigh and some spider veins in the lateral right thigh and also in the lower leg.  Currently she has no ulcers. I did look at the right great saphenous vein with the SonoSite and the vein did appear accessible just below the knee where it feeds a large varicose vein. On the left side she has some spider veins in the lower leg.  She has hyperpigmentation on the left and mild to moderate left leg swelling. PULMONARY: There is good air exchange bilaterally without wheezing or rales. ABDOMEN: Soft and non-tender with normal pitched bowel sounds.  MUSCULOSKELETAL: There are no major deformities or cyanosis. NEUROLOGIC: No focal weakness or paresthesias are detected. SKIN: There are no ulcers or rashes noted. PSYCHIATRIC: The patient has a normal affect.  DATA:    VENOUS DUPLEX: I have independently interpreted the venous duplex of bilateral lower extremities.  On the right side,  There is no evidence of deep venous thrombosis or superficial thrombophlebitis.  There is deep venous reflux involving the common femoral vein and femoral vein.  There is superficial venous reflux at the saphenofemoral junction and involving the great saphenous vein from the proximal thigh down to the knee.  There is also reflux in the short saphenous vein.  The great saphenous vein is  significantly dilated.  MEDICAL ISSUES:   CHRONIC VENOUS INSUFFICIENCY: This patient has significant chronic venous insufficiency.  She has clinical class for a venous disease.CEAP she does have some deep venous reflux bilaterally involving the common femoral vein.  On the right side she has significant reflux in the right great saphenous vein and the vein is significantly dilated.  We have discussed the importance of intermittent leg elevation and the proper positioning for this.  I written her prescription for knee-high compression stockings with a gradient of 15 to 20 mmHg.  She was not interested in thigh-high stockings with a titer gradient.  I have encouraged her to avoid prolonged sitting and standing.  I encouraged her to exercise.  Fortunately she is having a pool.  And I think this is very helpful for patients with venous disease.  We also discussed the importance of weight management.  If her symptoms progressed and certainly she might be a candidate for thigh-high stockings with a gradient of 20-30.  These were not successful she might be a candidate for laser ablation of the right great saphenous vein.  Currently she is not interested in considering a more aggressive approach.  I will be happy to see her back at any time if her symptoms progress.  [name] [facility] [phone]

## 2018-06-30 ENCOUNTER — Ambulatory Visit: Payer: BC Managed Care – PPO | Admitting: Podiatry

## 2018-06-30 DIAGNOSIS — L97511 Non-pressure chronic ulcer of other part of right foot limited to breakdown of skin: Secondary | ICD-10-CM

## 2018-06-30 DIAGNOSIS — I872 Venous insufficiency (chronic) (peripheral): Secondary | ICD-10-CM

## 2018-06-30 DIAGNOSIS — I83015 Varicose veins of right lower extremity with ulcer other part of foot: Secondary | ICD-10-CM | POA: Diagnosis not present

## 2018-06-30 DIAGNOSIS — Q667 Congenital pes cavus, unspecified foot: Secondary | ICD-10-CM

## 2018-06-30 DIAGNOSIS — M7662 Achilles tendinitis, left leg: Secondary | ICD-10-CM

## 2018-06-30 DIAGNOSIS — M7661 Achilles tendinitis, right leg: Secondary | ICD-10-CM

## 2018-06-30 DIAGNOSIS — I83028 Varicose veins of left lower extremity with ulcer other part of lower leg: Secondary | ICD-10-CM | POA: Diagnosis not present

## 2018-06-30 DIAGNOSIS — I878 Other specified disorders of veins: Secondary | ICD-10-CM | POA: Diagnosis not present

## 2018-06-30 DIAGNOSIS — M722 Plantar fascial fibromatosis: Secondary | ICD-10-CM | POA: Diagnosis not present

## 2018-06-30 DIAGNOSIS — L97828 Non-pressure chronic ulcer of other part of left lower leg with other specified severity: Secondary | ICD-10-CM

## 2018-06-30 MED ORDER — DICLOFENAC SODIUM 1 % TD GEL: 2.0000 g | Freq: Four times a day (QID) | TRANSDERMAL | 2 refills | Status: DC

## 2018-06-30 NOTE — Progress Notes (Signed)
Subjective:  Patient ID: [name], female    DOB: [birth year],  MRN: [id number]  No chief complaint on file.  62 y.o. female presents with the above complaint.  Reports bilateral foot pain.  States the plantar fasciitis is doing better it times but the tendinitis on the back is doing worse.  States that her ankle and inner side of her feet are still painful and burning.  Reports 9 out of 10 pain.  Has been using compression socks and good supportive shoes with her over-the-counter inserts.  Review of Systems: Negative except as noted in the HPI. Denies N/V/F/Ch.  Past Medical History:  Diagnosis Date  . Arthritis    right hip  . Blood transfusion   . Cancer [facility])    SKIN CANCER  . Depression   . Headache(784.0)    migraines  . MSSA (methicillin susceptible Staphylococcus aureus) infection   . T.T.P. syndrome ([place])    20 yrs ago-not seen anyone for 10 yrs.  . Varicose vein     Current Outpatient Medications:  .  diclofenac (VOLTAREN) 75 MG EC tablet, Take 1 tablet (75 mg total) by mouth 2 (two) times daily. (Patient not taking: Reported on 05/14/2018), Disp: 30 tablet, Rfl: 0 .  diclofenac sodium (VOLTAREN) 1 % GEL, Apply 2 g topically 4 (four) times daily. (Patient not taking: Reported on 05/14/2018), Disp: 300 g, Rfl: 2 .  diphenhydrAMINE (BENADRYL) 25 mg capsule, Take 1 capsule (25 mg total) by mouth every 6 (six) hours as needed for itching, allergies or sleep., Disp: , Rfl:  .  diphenhydrAMINE (BENADRYL) 25 mg capsule, Take 1 capsule (25 mg total) by mouth every 6 (six) hours as needed for itching, allergies or sleep., Disp: , Rfl:  .  DULoxetine (CYMBALTA) 60 MG capsule, Take 60 mg by mouth at bedtime. , Disp: , Rfl:  .  enoxaparin (LOVENOX) 40 MG/0.4ML injection, Inject 0.4 mLs (40 mg total) into the skin daily. (Patient not taking: Reported on 05/14/2018), Disp: 14 Syringe, Rfl: 0 .  Esomeprazole Magnesium (NEXIUM PO), Take by mouth every evening., Disp: , Rfl:  .  ibuprofen  (ADVIL,MOTRIN) 800 MG tablet, Take 800 mg by mouth 2 (two) times daily., Disp: , Rfl:  .  methylPREDNISolone (MEDROL DOSEPAK) 4 MG TBPK tablet, Take 1st as instructed (Patient not taking: Reported on 05/14/2018), Disp: 21 tablet, Rfl: 0  Current Facility-Administered Medications:  .  triamcinolone acetonide (KENALOG-40) injection 20 mg, 20 mg, Other, Once, Landis Martins, DPM  Social History   Tobacco Use  Smoking Status Former Smoker  . Packs/day: 1.50  . Years: 20.00  . Pack years: 30.00  . Types: Cigarettes  . Last attempt to quit: 10/04/1990  . Years since quitting: 27.7  Smokeless Tobacco Former Systems developer  . Quit date: 03/09/1991    Allergies  Allergen Reactions  . Latex Other (See Comments)    blisters blisters blisters   Objective:  There were no vitals filed for this visit. There is no height or weight on file to calculate BMI. Constitutional Well developed. Well nourished.  Vascular Dorsalis pedis pulses palpable bilaterally. Posterior tibial pulses palpable bilaterally. Capillary refill normal to all digits.  No cyanosis or clubbing noted. Pedal hair growth normal.  Neurologic Normal speech. Oriented to person, place, and time. Epicritic sensation to light touch grossly present bilaterally.  Dermatologic Nails well groomed and normal in appearance. No open wounds. Laceration to the left leg noted  small venous leg ulcerations present to bilateral lower extremities  Pitting edema present bilateral lower extremities.  Orthopedic: Normal joint ROM without pain or crepitus bilaterally. Pes cavus with pain palpation about the medial arch bilateral Pain palpation about the posterior lateral roll calcaneus at area of bony prominence    Radiographs: None today. Prior XR reviewed. Assessment:   1. Plantar fasciitis   2. Achilles tendinitis of both lower extremities   3. Venous (peripheral) insufficiency   4. Venous stasis   5. Venous stasis ulcer of other part of  left lower leg with other ulcer severity with varicose veins ([place])   6. Venous stasis ulcer of other part of right foot limited to breakdown of skin with varicose veins ([facility])   7. Pes cavus    Plan:  Patient was evaluated and treated and all questions answered.  Achilles tendinitis -Injection delivered to the painful area at the lateral aspect of the calcaneus. -Rx Voltaren gel. Educated on application.  Procedure: Tendon Injection Location: Left retrocalcaneal bursa joint Skin Prep: Alcohol. Injectate: 0.5 cc 1% lidocaine plain, 0.5 cc dexamethasone phosphate. Disposition: Patient tolerated procedure well. Injection site dressed with a band-aid.  Plantar Fasciitis with pes cavus -Improving.  Recommend custom molded orthotics.  Casted today.  Venous insufficiency with venous leg ulcerations; laceration left leg due to dog injury -Multilayer compression dressings applied bilaterally.  Educated on importance of compression therapy for wound healing.  States she normally wears compression socks.  Has seen a vein specialist before.  Return in about 3 weeks (around 07/21/2018) for Tendonitis, Plantar fasciitis, venous insufficiency.

## 2018-07-17 ENCOUNTER — Ambulatory Visit (INDEPENDENT_AMBULATORY_CARE_PROVIDER_SITE_OTHER): Payer: BC Managed Care – PPO | Admitting: Vascular Surgery

## 2018-07-17 ENCOUNTER — Encounter: Payer: Self-pay | Admitting: Vascular Surgery

## 2018-07-17 VITALS — BP 140/80 | HR 98 | Temp 97.6°F | Resp 16 | Ht 67.0 in | Wt 283.0 lb

## 2018-07-17 DIAGNOSIS — I872 Venous insufficiency (chronic) (peripheral): Secondary | ICD-10-CM

## 2018-07-17 NOTE — Progress Notes (Signed)
Patient name: [name] MRN: [id number] DOB: 04/19/1956 Sex: female  REASON FOR VISIT:   Follow-up of chronic venous insufficiency.  HPI:   [name] is a pleasant 62 y.o. female who I saw in consultation on 05/14/2018.  The patient was referred with chronic venous insufficiency.  The patient had CEAP clinical class IVa venous disease.  In the right lower extremity the patient had significant superficial venous reflux in the great saphenous vein from the proximal thigh down to the knee.  There was also reflux in the small saphenous vein.  I had looked at the vein myself with the SonoSite and it was significantly dilated with reflux.  It appeared to be accessible just below the knee where it fed a large varicose vein.  At the time of her last visit we discussed conservative treatment including the importance of intermittent leg elevation the proper positioning for this.  She was also fitted for knee-high compression stockings with a gradient of 15 to 20 mmHg.  We have also discussed thigh-high stockings with a gradient of 20-30 however she did not think this was practical at the time.  I plan on seeing her back as needed.  She comes in for a follow-up visit.  Patient comes in today because her symptoms in the right leg are worse.  She experiences significant aching pain heaviness and swelling in the right leg which is aggravated by standing and relieved with elevation.  She takes ibuprofen for pain.  She has been wearing her knee-high compression stockings but do not think these helped significantly.  She has multiple issues in her left leg including arthritis of the knee, some pain in her left heel, and fasciitis of her Achilles tendon.  She does have some aching pain also related to her venous disease on the left side.  She does have a small wound on her left leg where a dog scratched her.  Past Medical History:  Diagnosis Date  . Arthritis    right hip  . Blood transfusion   . Cancer  [facility])    SKIN CANCER  . Depression   . Headache(784.0)    migraines  . MSSA (methicillin susceptible Staphylococcus aureus) infection   . T.T.P. syndrome ([place])    20 yrs ago-not seen anyone for 10 yrs.  . Varicose vein     Family History  Problem Relation Age of Onset  . Alzheimer's disease Father   . Prostate cancer Father   . Seizures Unknown     SOCIAL HISTORY: Social History   Tobacco Use  . Smoking status: Former Smoker    Packs/day: 1.50    Years: 20.00    Pack years: 30.00    Types: Cigarettes    Last attempt to quit: 10/04/1990    Years since quitting: 27.8  . Smokeless tobacco: Former Systems developer    Quit date: 03/09/1991  Substance Use Topics  . Alcohol use: Yes    Comment: OCCASIONAL    Allergies  Allergen Reactions  . Latex Other (See Comments)    Not allergic to gloves    Current Outpatient Medications  Medication Sig Dispense Refill  . DULoxetine (CYMBALTA) 60 MG capsule Take 60 mg by mouth at bedtime.     . Esomeprazole Magnesium (NEXIUM PO) Take by mouth every evening.    . gabapentin (NEURONTIN) 300 MG capsule Take 300 mg by mouth 2 (two) times daily.    [name] Kitchen ibuprofen (ADVIL,MOTRIN) 800 MG tablet Take 800 mg by mouth  3 (three) times daily.     . diclofenac (VOLTAREN) 75 MG EC tablet Take 1 tablet (75 mg total) by mouth 2 (two) times daily. (Patient not taking: Reported on 05/14/2018) 30 tablet 0  . diclofenac sodium (VOLTAREN) 1 % GEL Apply 2 g topically 4 (four) times daily. (Patient not taking: Reported on 07/17/2018) 300 g 2  . diphenhydrAMINE (BENADRYL) 25 mg capsule Take 1 capsule (25 mg total) by mouth every 6 (six) hours as needed for itching, allergies or sleep.    . diphenhydrAMINE (BENADRYL) 25 mg capsule Take 1 capsule (25 mg total) by mouth every 6 (six) hours as needed for itching, allergies or sleep.    [name] Kitchen enoxaparin (LOVENOX) 40 MG/0.4ML injection Inject 0.4 mLs (40 mg total) into the skin daily. (Patient not taking: Reported on 05/14/2018) 14  Syringe 0  . methylPREDNISolone (MEDROL DOSEPAK) 4 MG TBPK tablet Take 1st as instructed (Patient not taking: Reported on 05/14/2018) 21 tablet 0   Current Facility-Administered Medications  Medication Dose Route Frequency Provider Last Rate Last Dose  . triamcinolone acetonide (KENALOG-40) injection 20 mg  20 mg Other Once Landis Martins, DPM        REVIEW OF SYSTEMS:  [X]  denotes positive finding, [ ]  denotes negative finding Cardiac  Comments:  Chest pain or chest pressure:    Shortness of breath upon exertion:    Short of breath when lying flat:    Irregular heart rhythm:        Vascular    Pain in calf, thigh, or hip brought on by ambulation:    Pain in feet at night that wakes you up from your sleep:     Blood clot in your veins:    Leg swelling:  x       Pulmonary    Oxygen at home:    Productive cough:     Wheezing:         Neurologic    Sudden weakness in arms or legs:     Sudden numbness in arms or legs:     Sudden onset of difficulty speaking or slurred speech:    Temporary loss of vision in one eye:     Problems with dizziness:         Gastrointestinal    Blood in stool:     Vomited blood:         Genitourinary    Burning when urinating:     Blood in urine:        Psychiatric    Major depression:         Hematologic    Bleeding problems:    Problems with blood clotting too easily:        Skin    Rashes or ulcers: x Left leg      Constitutional    Fever or chills:     PHYSICAL EXAM:   Vitals:   07/17/18 1249  BP: 140/80  Pulse: 98  Resp: 16  Temp: 97.6 F (36.4 C)  SpO2: 98%  Weight: 283 lb (128.4 kg)  Height: 5\' 7"  (1.702 m)    GENERAL: The patient is a well-nourished female, in no acute distress. The vital signs are documented above. CARDIAC: There is a regular rate and rhythm.  VASCULAR:  ARTERIAL: I do not detect carotid bruits. On the right leg I cannot palpate a dorsalis pedis or posterior tibial pulse however the foot is  significantly swollen.  She does have a triphasic dorsalis pedis and  posterior tibial signal with the Doppler. On the left side she has a palpable dorsalis pedis pulse.  She has a triphasic dorsalis pedis and posterior tibial signal on the left with the Doppler. VENOUS: This patient has significant hyperpigmentation of both lower extremities.  She does have some enlarged varicose veins in both thighs and also in the calf on the right. PULMONARY: There is good air exchange bilaterally without wheezing or rales. ABDOMEN: Soft and non-tender with normal pitched bowel sounds.  MUSCULOSKELETAL: There are no major deformities or cyanosis. NEUROLOGIC: No focal weakness or paresthesias are detected. SKIN: She has an ulcer on her left anterior leg. PSYCHIATRIC: The patient has a normal affect.  DATA:    No new data  MEDICAL ISSUES:   PAINFUL VARICOSE VEINS OF THE RIGHT LOWER EXTREMITY WITH CHRONIC VENOUS INSUFFICIENCY BILATERALLY: This patient has CEAP clinical class IVa venous disease.  Her knee-high compression stockings have not been helpful and I have recommended that she obtain thigh-high compression stockings with a gradient of 20 to 30 mmHg.  She lives in [place] and therefore she is going to get these from elastic therapy in [place] today.  We have again discussed the importance of intermittent leg elevation the proper positioning for this.  I have encouraged her to avoid prolonged sitting and standing.  We discussed the importance of exercise and nutrition.  If her symptoms progress and the thigh-high stockings are not helpful but I think she would be a good candidate for endovenous laser ablation of the right great saphenous vein.  I will see her back in 3 months.  She knows to call sooner if she has problems.  I did reassure her that she has excellent arterial flow and this should not compromise healing of her left leg wound.  I did explained that given her chronic venous insufficiency this  certainly could compromise healing of the left leg wound and the best treatment for this is elevation and compression.  [name] [facility] [phone]

## 2018-07-21 ENCOUNTER — Ambulatory Visit: Payer: BC Managed Care – PPO | Admitting: Podiatry

## 2018-07-22 ENCOUNTER — Ambulatory Visit: Payer: BC Managed Care – PPO | Admitting: Podiatry

## 2018-07-28 ENCOUNTER — Ambulatory Visit: Payer: BC Managed Care – PPO | Admitting: Podiatry

## 2018-07-30 ENCOUNTER — Ambulatory Visit: Payer: BC Managed Care – PPO | Admitting: Sports Medicine

## 2018-10-09 ENCOUNTER — Ambulatory Visit: Payer: BC Managed Care – PPO | Admitting: Vascular Surgery

## 2018-10-16 ENCOUNTER — Telehealth: Payer: Self-pay | Admitting: Vascular Surgery

## 2018-10-16 NOTE — Telephone Encounter (Signed)
resch appt time lvm mld ltr 11/06/2018 315pm f/u MD

## 2018-10-30 ENCOUNTER — Ambulatory Visit: Payer: BC Managed Care – PPO | Admitting: Vascular Surgery

## 2018-11-06 ENCOUNTER — Ambulatory Visit: Payer: BC Managed Care – PPO | Admitting: Vascular Surgery

## 2018-11-06 ENCOUNTER — Other Ambulatory Visit: Payer: Self-pay

## 2018-11-06 ENCOUNTER — Encounter: Payer: Self-pay | Admitting: Vascular Surgery

## 2018-11-06 VITALS — BP 129/87 | HR 89 | Temp 97.0°F | Resp 18 | Ht 67.0 in | Wt 283.0 lb

## 2018-11-06 DIAGNOSIS — I872 Venous insufficiency (chronic) (peripheral): Secondary | ICD-10-CM | POA: Diagnosis not present

## 2018-11-06 NOTE — Progress Notes (Addendum)
Patient name: [name] MRN: [id number] DOB: Mar 03, 1956 Sex: female  REASON FOR VISIT:   29-month follow-up visit.  HPI:   [name] is a pleasant 63 y.o. female who I last saw on 07/17/2018.  The patient has chronic venous insufficiency.  The patient has CEAP C-4 venous disease.  In the right lower extremity, the patient has significant reflux in the great saphenous vein from the proximal thigh down to the knee.  There was also reflux in the small saphenous vein.  When I looked at the vein myself with the SonoSite it was significantly dilated.  She had been wearing knee-high compression stockings when I saw her last and her pain was not improving in fact it was worse.  I put her in thigh-high stockings and she comes in for a 49-month follow-up visit.  I felt that if her symptoms did not improve that she might be considered for endovenous laser ablation of the right great saphenous vein.  Of note she also had a wound on her left leg where the dog had scratched her but I felt she had adequate circulation to heal this.  Since I saw the patient last that she has been wearing her thigh-high compression stockings with a gradient of 20 to 30 mmHg.  These do help significantly.  However, she is continuing to have aching pain and heaviness in her right leg which is aggravated by standing and sitting and relieved with elevation.  She is also having problems with her left knee and is being considered for surgery on her knee and her left foot.  She does not have significant aching and heaviness in the leg from venous disease on the left.  Her symptoms on the left are mostly in her left knee joint.  There have been no significant changes in her medical history.  Current Outpatient Medications  Medication Sig Dispense Refill  . diclofenac (VOLTAREN) 75 MG EC tablet Take 1 tablet (75 mg total) by mouth 2 (two) times daily. (Patient not taking: Reported on 05/14/2018) 30 tablet 0  . diclofenac sodium  (VOLTAREN) 1 % GEL Apply 2 g topically 4 (four) times daily. (Patient not taking: Reported on 07/17/2018) 300 g 2  . diphenhydrAMINE (BENADRYL) 25 mg capsule Take 1 capsule (25 mg total) by mouth every 6 (six) hours as needed for itching, allergies or sleep.    . diphenhydrAMINE (BENADRYL) 25 mg capsule Take 1 capsule (25 mg total) by mouth every 6 (six) hours as needed for itching, allergies or sleep.    . DULoxetine (CYMBALTA) 60 MG capsule Take 60 mg by mouth at bedtime.     . enoxaparin (LOVENOX) 40 MG/0.4ML injection Inject 0.4 mLs (40 mg total) into the skin daily. (Patient not taking: Reported on 05/14/2018) 14 Syringe 0  . Esomeprazole Magnesium (NEXIUM PO) Take by mouth every evening.    . gabapentin (NEURONTIN) 300 MG capsule Take 300 mg by mouth 2 (two) times daily.    [name] Kitchen ibuprofen (ADVIL,MOTRIN) 800 MG tablet Take 800 mg by mouth 3 (three) times daily.     . methylPREDNISolone (MEDROL DOSEPAK) 4 MG TBPK tablet Take 1st as instructed (Patient not taking: Reported on 05/14/2018) 21 tablet 0   Current Facility-Administered Medications  Medication Dose Route Frequency Provider Last Rate Last Dose  . triamcinolone acetonide (KENALOG-40) injection 20 mg  20 mg Other Once Landis Martins, DPM        REVIEW OF SYSTEMS:  [X]  denotes positive finding, [ ]   denotes negative finding Vascular    Leg swelling    Cardiac    Chest pain or chest pressure:    Shortness of breath upon exertion:    Short of breath when lying flat:    Irregular heart rhythm:    Constitutional    Fever or chills:     PHYSICAL EXAM:   Vitals:   11/06/18 1502  BP: 129/87  Pulse: 89  Resp: 18  Temp: (!) 97 F (36.1 C)  TempSrc: Oral  SpO2: 98%  Weight: 283 lb (128.4 kg)  Height: 5\' 7"  (1.702 m)    GENERAL: The patient is a well-nourished female, in no acute distress. The vital signs are documented above. CARDIOVASCULAR: There is a regular rate and rhythm. PULMONARY: There is good air exchange bilaterally  without wheezing or rales. VASCULAR: I do not detect carotid bruits. She has biphasic dorsalis pedis and posterior tibial signal in the right foot. She has a palpable left dorsalis pedis pulse and a biphasic dorsalis pedis and posterior tibial signal on the left foot. VENOUS: This patient has significant hyperpigmentation of both lower extremities consistent with CEAP C-4 venous disease.  DATA:   VENOUS DUPLEX: I reviewed her previous venous duplex scan from 05/14/2018.  On the right side there was no evidence of DVT or superficial thrombophlebitis.  There was a venous reflux involving the common femoral vein and femoral vein.  There was superficial venous reflux involving the entire right great saphenous vein.  There was only some focal reflux in the small saphenous vein in the mid calf.  On the left side there was no evidence of DVT or superficial thrombophlebitis.  There was deep venous reflux in the common femoral vein.  There was no significant reflux in the great saphenous vein.  There was focal reflux only in the mid calf in the small saphenous vein on the left.  MEDICAL ISSUES:   CHRONIC VENOUS INSUFFICIENCY: This patient is continuing to have significant aching and heaviness in her right leg despite wearing her thigh-high compression stockings.  I think she is a good candidate for endovenous laser ablation of the right great saphenous vein and I did look at the vein again myself today in the office.  The vein is significantly dilated with reflux from the saphenofemoral junction down to the knee.  She would like to discuss the timing of her endovenous laser procedure on the right with the surgery she has planned on her left leg with her orthopedic surgeon.  I have discussed the indications for endovenous laser ablation of the right GSV, that is to lower the pressure in the veins and potentially help relieve the symptoms from venous hypertension. I have also discussed alternative options  including conservative treatment with leg elevation, compression therapy, exercise, avoiding prolonged sitting and standing, and weight management. I have discussed the potential complications of the procedure, including, but not limited to: bleeding, bruising, leg swelling, nerve injury, skin burns, significant pain from phlebitis, deep venous thrombosis, or failure of the vein to close.  I have also explained that venous insufficiency is a chronic disease, and that the patient is at risk for recurrent varicose veins in the future.  All of the patient's questions were encouraged and answered.  The patient will call when she is ready to schedule the procedure.  [name] [facility] [phone]

## 2018-11-07 ENCOUNTER — Ambulatory Visit: Payer: BC Managed Care – PPO | Admitting: Sports Medicine

## 2018-11-07 ENCOUNTER — Encounter: Payer: Self-pay | Admitting: Sports Medicine

## 2018-11-07 DIAGNOSIS — M7661 Achilles tendinitis, right leg: Secondary | ICD-10-CM | POA: Diagnosis not present

## 2018-11-07 DIAGNOSIS — Q667 Congenital pes cavus, unspecified foot: Secondary | ICD-10-CM | POA: Diagnosis not present

## 2018-11-07 DIAGNOSIS — M7662 Achilles tendinitis, left leg: Secondary | ICD-10-CM

## 2018-11-07 DIAGNOSIS — I872 Venous insufficiency (chronic) (peripheral): Secondary | ICD-10-CM

## 2018-11-07 DIAGNOSIS — M722 Plantar fascial fibromatosis: Secondary | ICD-10-CM

## 2018-11-07 NOTE — Patient Instructions (Signed)

## 2018-11-07 NOTE — Progress Notes (Signed)
Subjective: [name] is a 63 y.o. female patient returns to office for follow-up of Achilles tendinitis left greater than right.  Patient reports that her plantar fascia is doing a little better however the worst pain is at the back of the heel and states that she cannot find a shoe that is comfortable that will not rub the back of her bone spur at the back of her heel states today pain is 4 out of 10 however pain sometimes can be much higher depending on her activities.  Denies warmth redness or any other symptoms beyond normal.  Patient has chronic venous insufficiency and changes to her legs due to her circulation.  Patient states that she has been using lidocaine ointment to the back of the heels which seems to help a little.  Patient is also here for pickup of orthotics.  No other acute findings noted.  Patient Active Problem List   Diagnosis Date Noted  . Chest pain 04/09/2016  . Anal fissure 11/30/2015  . Lichen sclerosus et atrophicus of the vulva 11/30/2015  . 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 Snoring 11/22/2015  . Fatigue 10/13/2015  . Morbid obesity with BMI of 45.0-49.9, adult ([place]) 10/13/2015  . 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 History of operative procedure on hip 03/10/2012  . 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 Methicillin susceptible Staphylococcus aureus infection 03/09/2011  . Carpal tunnel syndrome 03/09/2011  . History of immune thrombocytopenia 03/09/2011    Current Outpatient Medications on File Prior to Visit  Medication Sig Dispense Refill  . buPROPion HCl (WELLBUTRIN PO) Take by mouth daily.    . diclofenac (VOLTAREN) 75 MG EC tablet Take 1 tablet (75 mg total) by mouth 2 (two) times daily. 30 tablet 0  . diclofenac sodium (VOLTAREN) 1 % GEL Apply 2 g topically 4 (four) times daily. 300 g 2  . DULoxetine  (CYMBALTA) 60 MG capsule Take 60 mg by mouth at bedtime.     . enoxaparin (LOVENOX) 40 MG/0.4ML injection Inject 0.4 mLs (40 mg total) into the skin daily. 14 Syringe 0  . Esomeprazole Magnesium (NEXIUM PO) Take by mouth every evening.    . gabapentin (NEURONTIN) 300 MG capsule Take 300 mg by mouth 2 (two) times daily.    [name] Kitchen HYDROcodone-acetaminophen (NORCO) 10-325 MG tablet Take 1 tablet by mouth every 6 (six) hours as needed.    [name] Kitchen ibuprofen (ADVIL,MOTRIN) 800 MG tablet Take 800 mg by mouth 3 (three) times daily.     . methylPREDNISolone (MEDROL DOSEPAK) 4 MG TBPK tablet Take 1st as instructed 21 tablet 0  . diphenhydrAMINE (BENADRYL) 25 mg capsule Take 1 capsule (25 mg total) by mouth every 6 (six) hours as needed for itching, allergies or sleep.    . diphenhydrAMINE (BENADRYL) 25 mg capsule Take 1 capsule (25 mg total) by mouth every 6 (six) hours as needed for itching, allergies or sleep.     Current Facility-Administered Medications on File Prior to Visit  Medication Dose Route Frequency Provider Last Rate Last Dose  . triamcinolone acetonide (KENALOG-40) injection 20 mg  20 mg Other Once Landis Martins, DPM        Allergies  Allergen Reactions  . Latex Other (See Comments)    Not allergic to gloves blisters    Objective: Physical Exam General: The patient is alert and oriented x3 in no  acute distress.  Dermatology: Skin is warm, dry and supple bilateral lower extremities. Nails 1-10 are asymptomatic.  There is hyperpigmentation noted to both lower extremities suggestive of venous stasis with severe varicosities. Integument is otherwise unremarkable.  Vascular: Dorsalis Pedis pulse and Posterior Tibial pulse not able to be palpated due to 1+ pitting edema bilateral. Capillary fill time is immediate to all digits.  Neurological: Grossly intact to light touch with an achilles reflex of +2/5 and a  negative Tinel's sign bilateral.  Musculoskeletal: Tenderness to palpation at the  medial calcaneal tubercale and through the insertion of the plantar fascia on the left and along the peroneal tendon course on the left.  Mild chronic pain to Achilles insertion left greater than right.  No pain with compression of calcaneus bilateral. No pain with tuning fork to calcaneus bilateral. No pain with calf compression bilateral. There is decreased Ankle joint range of motion bilateral. All other joints range of motion within normal limits bilateral however there is mild guarding with inversion of left foot and ankle.  There is also prominent posterior Haglund's deformity present left greater than right. Strength 5/5 in all groups bilateral.   Assessment and Plan: Problem List Items Addressed This Visit    None    Visit Diagnoses    Achilles tendinitis of both lower extremities    -  Primary   Plantar fasciitis       Venous (peripheral) insufficiency       Pes cavus          -Complete examination performed.  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 Explained both conservative and surgical treatments.  -Orthotics dispensed to patient and advised patient to break in slowly as explained -Continue with topical lidocaine and gentle stretching with night splint or range of motion and gentle massage -Recommend rest ice elevation and avoid strenuous activity that could stress feet/ankles -Patient to return to office in 6 weeks for orthotic check.  Advised patient to consider physical therapy versus shockwave treatment if her symptoms fail to be relieved and encourage patient to find shoes that are comfortable that do not rub the back of her heel.  Landis Martins, DPM

## 2018-12-19 ENCOUNTER — Ambulatory Visit: Payer: BC Managed Care – PPO | Admitting: Sports Medicine

## 2019-01-29 ENCOUNTER — Telehealth: Payer: Self-pay | Admitting: *Deleted

## 2019-01-29 ENCOUNTER — Other Ambulatory Visit: Payer: Self-pay | Admitting: *Deleted

## 2019-01-29 DIAGNOSIS — R002 Palpitations: Secondary | ICD-10-CM

## 2019-01-29 NOTE — Telephone Encounter (Signed)
Talked with pt about 3 day monitor and she is comfortable putting this on so will have 3 day [employer] sent to her home.

## 2019-02-03 ENCOUNTER — Other Ambulatory Visit: Payer: BC Managed Care – PPO

## 2019-02-27 ENCOUNTER — Telehealth: Payer: Self-pay | Admitting: *Deleted

## 2019-02-27 NOTE — Telephone Encounter (Signed)
Dr. Denton Lank office called for result of monitor. Let them know Dr. Agustin Cree will look at results and give his summary of monitor and then we will send them the results.

## 2019-03-02 ENCOUNTER — Telehealth: Payer: Self-pay | Admitting: Cardiology

## 2019-03-02 NOTE — Telephone Encounter (Signed)
Virtual Visit Pre-Appointment Phone Call  "(Name), I am calling you today to discuss your upcoming appointment. We are currently trying to limit exposure to the virus that causes COVID-19 by seeing patients at home rather than in the office."  1. "What is the BEST phone number to call the day of the visit?" - include this in appointment notes  2. Do you have or have access to (through a family member/friend) a smartphone with video capability that we can use for your visit?" a. If yes - list this number in appt notes as cell (if different from BEST phone #) and list the appointment type as a VIDEO visit in appointment notes b. If no - list the appointment type as a PHONE visit in appointment notes  3. Confirm consent - "In the setting of the current Covid19 crisis, you are scheduled for a (phone or video) visit with your provider on (date) at (time).  Just as we do with many in-office visits, in order for you to participate in this visit, we must obtain consent.  If you'd like, I can send this to your mychart (if signed up) or email for you to review.  Otherwise, I can obtain your verbal consent now.  All virtual visits are billed to your insurance company just like a normal visit would be.  By agreeing to a virtual visit, we'd like you to understand that the technology does not allow for your provider to perform an examination, and thus may limit your provider's ability to fully assess your condition. If your provider identifies any concerns that need to be evaluated in person, we will make arrangements to do so.  Finally, though the technology is pretty good, we cannot assure that it will always work on either your or our end, and in the setting of a video visit, we may have to convert it to a phone-only visit.  In either situation, we cannot ensure that we have a secure connection.  Are you willing to proceed?" STAFF: Did the patient verbally acknowledge consent to telehealth visit? Document  YES/NO here: yES  4. Advise patient to be prepared - "Two hours prior to your appointment, go ahead and check your blood pressure, pulse, oxygen saturation, and your weight (if you have the equipment to check those) and write them all down. When your visit starts, your provider will ask you for this information. If you have an Apple Watch or Kardia device, please plan to have heart rate information ready on the day of your appointment. Please have a pen and paper handy nearby the day of the visit as well."  5. Give patient instructions for MyChart download to smartphone OR [email or web address] as below if video visit (depending on what platform provider is using)  6. Inform patient they will receive a phone call 15 minutes prior to their appointment time (may be from unknown caller ID) so they should be prepared to answer    TELEPHONE CALL NOTE  KETRINA BOATENG has been deemed a candidate for a follow-up tele-health visit to limit community exposure during the Covid-19 pandemic. I spoke with the patient via phone to ensure availability of phone/video source, confirm preferred email & phone number, and discuss instructions and expectations.  I reminded JANYLA BISCOE to be prepared with any vital sign and/or heart rhythm information that could potentially be obtained via home monitoring, at the time of her visit. I reminded CHARDAI GANGEMI to expect a phone call prior to  her visit.  Calla Kicks 03/02/2019 12:15 PM   INSTRUCTIONS FOR DOWNLOADING THE MYCHART APP TO SMARTPHONE  - The patient must first make sure to have activated MyChart and know their login information - If Apple, go to [employer] and type in MyChart in the search bar and download the app. If Android, ask patient to go to [employer] and type in [place] in the search bar and download the app. The app is free but as with any other app downloads, their phone may require them to verify saved payment information or Apple/Android  password.  - The patient will need to then log into the app with their MyChart username and password, and select [facility] as their healthcare provider to link the account. When it is time for your visit, go to the MyChart app, find appointments, and click Begin Video Visit. Be sure to Select Allow for your device to access the Microphone and Camera for your visit. You will then be connected, and your provider will be with you shortly.  **If they have any issues connecting, or need assistance please contact MyChart service desk [phone])**  **If using a computer, in order to ensure the best quality for their visit they will need to use either of the following Internet Browsers: [employer], or Google Chrome**  IF USING DOXIMITY or DOXY.ME - The patient will receive a link just prior to their visit by text.     FULL LENGTH CONSENT FOR TELE-HEALTH VISIT   I hereby voluntarily request, consent and authorize [facility] and its employed or contracted physicians, physician assistants, nurse practitioners or other licensed health care professionals (the Practitioner), to provide me with telemedicine health care services (the Services") as deemed necessary by the treating Practitioner. I acknowledge and consent to receive the Services by the Practitioner via telemedicine. I understand that the telemedicine visit will involve communicating with the Practitioner through live audiovisual communication technology and the disclosure of certain medical information by electronic transmission. I acknowledge that I have been given the opportunity to request an in-person assessment or other available alternative prior to the telemedicine visit and am voluntarily participating in the telemedicine visit.  I understand that I have the right to withhold or withdraw my consent to the use of telemedicine in the course of my care at any time, without affecting my right to future care or treatment,  and that the Practitioner or I may terminate the telemedicine visit at any time. I understand that I have the right to inspect all information obtained and/or recorded in the course of the telemedicine visit and may receive copies of available information for a reasonable fee.  I understand that some of the potential risks of receiving the Services via telemedicine include:   Delay or interruption in medical evaluation due to technological equipment failure or disruption;  Information transmitted may not be sufficient (e.g. poor resolution of images) to allow for appropriate medical decision making by the Practitioner; and/or   In rare instances, security protocols could fail, causing a breach of personal health information.  Furthermore, I acknowledge that it is my responsibility to provide information about my medical history, conditions and care that is complete and accurate to the best of my ability. I acknowledge that Practitioner's advice, recommendations, and/or decision may be based on factors not within their control, such as incomplete or inaccurate data provided by me or distortions of diagnostic images or specimens that may result from electronic transmissions. I  understand that the practice of medicine is not an exact science and that Practitioner makes no warranties or guarantees regarding treatment outcomes. I acknowledge that I will receive a copy of this consent concurrently upon execution via email to the email address I last provided but may also request a printed copy by calling the office of [facility].    I understand that my insurance will be billed for this visit.   I have read or had this consent read to me.  I understand the contents of this consent, which adequately explains the benefits and risks of the Services being provided via telemedicine.   I have been provided ample opportunity to ask questions regarding this consent and the Services and have had my questions  answered to my satisfaction.  I give my informed consent for the services to be provided through the use of telemedicine in my medical care  By participating in this telemedicine visit I agree to the above.

## 2019-03-04 ENCOUNTER — Other Ambulatory Visit: Payer: Self-pay

## 2019-03-04 ENCOUNTER — Telehealth (INDEPENDENT_AMBULATORY_CARE_PROVIDER_SITE_OTHER): Payer: BC Managed Care – PPO | Admitting: Cardiology

## 2019-03-04 ENCOUNTER — Encounter: Payer: Self-pay | Admitting: Emergency Medicine

## 2019-03-04 ENCOUNTER — Encounter: Payer: Self-pay | Admitting: Cardiology

## 2019-03-04 VITALS — Ht 67.0 in | Wt 157.0 lb

## 2019-03-04 DIAGNOSIS — R072 Precordial pain: Secondary | ICD-10-CM | POA: Diagnosis not present

## 2019-03-04 DIAGNOSIS — R002 Palpitations: Secondary | ICD-10-CM | POA: Insufficient documentation

## 2019-03-04 DIAGNOSIS — E785 Hyperlipidemia, unspecified: Secondary | ICD-10-CM | POA: Insufficient documentation

## 2019-03-04 DIAGNOSIS — G40109 Localization-related (focal) (partial) symptomatic epilepsy and epileptic syndromes with simple partial seizures, not intractable, without status epilepticus: Secondary | ICD-10-CM

## 2019-03-04 HISTORY — DX: Palpitations: R00.2

## 2019-03-04 MED ORDER — METOPROLOL SUCCINATE ER 25 MG PO TB24: 50.0000 mg | ORAL_TABLET | Freq: Every day | ORAL | 1 refills | Status: DC

## 2019-03-04 MED ORDER — METOPROLOL SUCCINATE ER 25 MG PO TB24: 75.0000 mg | ORAL_TABLET | Freq: Every day | ORAL | 1 refills | Status: DC

## 2019-03-04 NOTE — Patient Instructions (Signed)
Medication Instructions:  Your physician has recommended you make the following change in your medication:   Increase: Metoprolol succinate  25 mg twice daily   If you need a refill on your cardiac medications before your next appointment, please call your pharmacy.   Lab work: None.  If you have labs (blood work) drawn today and your tests are completely normal, you will receive your results only by:  [place] (if you have MyChart) OR  A paper copy in the mail If you have any lab test that is abnormal or we need to change your treatment, we will call you to review the results.  Testing/Procedures: Your physician discussed the importance of regular exercise and recommended that you start or continue a regular exercise program for good health.  Your physician has requested that you have an echocardiogram. Echocardiography is a painless test that uses sound waves to create images of your heart. It provides your doctor with information about the size and shape of your heart and how well your hearts chambers and valves are working. This procedure takes approximately one hour. There are no restrictions for this procedure.    Follow-Up: At [facility], you and your health needs are our priority.  As part of our continuing mission to provide you with exceptional heart care, we have created designated Provider Care Teams.  These Care Teams include your primary Cardiologist (physician) and Advanced Practice Providers (APPs -  Physician Assistants and Nurse Practitioners) who all work together to provide you with the care you need, when you need it. You will need a follow up appointment in 6 weeks.  Please call our office 2 months in advance to schedule this appointment.  You may see No primary care provider on file. or another member of our [employer] Provider Team in : Shirlee More, MD  Jyl Heinz, MD  Any Other Special Instructions Will Be Listed Below (If  Applicable).   Echocardiogram An echocardiogram is a procedure that uses painless sound waves (ultrasound) to produce an image of the heart. Images from an echocardiogram can provide important information about:  Signs of coronary artery disease (CAD).  Aneurysm detection. An aneurysm is a weak or damaged part of an artery wall that bulges out from the normal force of blood pumping through the body.  Heart size and shape. Changes in the size or shape of the heart can be associated with certain conditions, including heart failure, aneurysm, and CAD.  Heart muscle function.  Heart valve function.  Signs of a past heart attack.  Fluid buildup around the heart.  Thickening of the heart muscle.  A tumor or infectious growth around the heart valves. Tell a health care provider about:  Any allergies you have.  All medicines you are taking, including vitamins, herbs, eye drops, creams, and over-the-counter medicines.  Any blood disorders you have.  Any surgeries you have had.  Any medical conditions you have.  Whether you are pregnant or may be pregnant. What are the risks? Generally, this is a safe procedure. However, problems may occur, including:  Allergic reaction to dye (contrast) that may be used during the procedure. What happens before the procedure? No specific preparation is needed. You may eat and drink normally. What happens during the procedure?   An IV tube may be inserted into one of your veins.  You may receive contrast through this tube. A contrast is an injection that improves the quality of the pictures from your heart.  A  gel will be applied to your chest.  A wand-like tool (transducer) will be moved over your chest. The gel will help to transmit the sound waves from the transducer.  The sound waves will harmlessly bounce off of your heart to allow the heart images to be captured in real-time motion. The images will be recorded on a computer. The  procedure may vary among health care providers and hospitals. What happens after the procedure?  You may return to your normal, everyday life, including diet, activities, and medicines, unless your health care provider tells you not to do that. Summary  An echocardiogram is a procedure that uses painless sound waves (ultrasound) to produce an image of the heart.  Images from an echocardiogram can provide important information about the size and shape of your heart, heart muscle function, heart valve function, and fluid buildup around your heart.  You do not need to do anything to prepare before this procedure. You may eat and drink normally.  After the echocardiogram is completed, you may return to your normal, everyday life, unless your health care provider tells you not to do that. This information is not intended to replace advice given to you by your health care provider. Make sure you discuss any questions you have with your health care provider. Document Released: 10/05/2000 Document Revised: 11/10/2016 Document Reviewed: 11/10/2016 Elsevier Interactive Patient Education  2019 [employer].    Cardiac Nuclear Scan A cardiac nuclear scan is a test that measures blood flow to the heart when a person is resting and when he or she is exercising. The test looks for problems such as:  Not enough blood reaching a portion of the heart.  The heart muscle not working normally. You may need this test if:  You have heart disease.  You have had abnormal lab results.  You have had heart surgery or a balloon procedure to open up blocked arteries (angioplasty).  You have chest pain.  You have shortness of breath. In this test, a radioactive dye (tracer) is injected into your bloodstream. After the tracer has traveled to your heart, an imaging device is used to measure how much of the tracer is absorbed by or distributed to various areas of your heart. This procedure is usually done at a  hospital and takes 2-4 hours. Tell a health care provider about:  Any allergies you have.  All medicines you are taking, including vitamins, herbs, eye drops, creams, and over-the-counter medicines.  Any problems you or family members have had with anesthetic medicines.  Any blood disorders you have.  Any surgeries you have had.  Any medical conditions you have.  Whether you are pregnant or may be pregnant. What are the risks? Generally, this is a safe procedure. However, problems may occur, including:  Serious chest pain and heart attack. This is only a risk if the stress portion of the test is done.  Rapid heartbeat.  Sensation of warmth in your chest. This usually passes quickly.  Allergic reaction to the tracer. What happens before the procedure?  Ask your health care provider about changing or stopping your regular medicines. This is especially important if you are taking diabetes medicines or blood thinners.  Follow instructions from your health care provider about eating or drinking restrictions.  Remove your jewelry on the day of the procedure. What happens during the procedure?  An IV will be inserted into one of your veins.  Your health care provider will inject a small amount of radioactive  tracer through the IV.  You will wait for 20-40 minutes while the tracer travels through your bloodstream.  Your heart activity will be monitored with an electrocardiogram (ECG).  You will lie down on an exam table.  Images of your heart will be taken for about 15-20 minutes.  You may also have a stress test. For this test, one of the following may be done: ? You will exercise on a treadmill or stationary bike. While you exercise, your heart's activity will be monitored with an ECG, and your blood pressure will be checked. ? You will be given medicines that will increase blood flow to parts of your heart. This is done if you are unable to exercise.  When blood flow to  your heart has peaked, a tracer will again be injected through the IV.  After 20-40 minutes, you will get back on the exam table and have more images taken of your heart.  Depending on the type of tracer used, scans may need to be repeated 3-4 hours later.  Your IV line will be removed when the procedure is over. The procedure may vary among health care providers and hospitals. What happens after the procedure?  Unless your health care provider tells you otherwise, you may return to your normal schedule, including diet, activities, and medicines.  Unless your health care provider tells you otherwise, you may increase your fluid intake. This will help to flush the contrast dye from your body. Drink enough fluid to keep your urine pale yellow.  Ask your health care provider, or the department that is doing the test: ? When will my results be ready? ? How will I get my results? Summary  A cardiac nuclear scan measures the blood flow to the heart when a person is resting and when he or she is exercising.  Tell your health care provider if you are pregnant.  Before the procedure, ask your health care provider about changing or stopping your regular medicines. This is especially important if you are taking diabetes medicines or blood thinners.  After the procedure, unless your health care provider tells you otherwise, increase your fluid intake. This will help flush the contrast dye from your body.  After the procedure, unless your health care provider tells you otherwise, you may return to your normal schedule, including diet, activities, and medicines. This information is not intended to replace advice given to you by your health care provider. Make sure you discuss any questions you have with your health care provider. Document Released: 11/02/2004 Document Revised: 03/24/2018 Document Reviewed: 03/24/2018 Elsevier Interactive Patient Education  2019 [employer].

## 2019-03-04 NOTE — Addendum Note (Signed)
Addended by: Ashok Norris on: 03/04/2019 03:54 PM   Modules accepted: Orders

## 2019-03-04 NOTE — Progress Notes (Signed)
Virtual Visit via Video Note   This visit type was conducted due to national recommendations for restrictions regarding the COVID-19 Pandemic (e.g. social distancing) in an effort to limit this patient's exposure and mitigate transmission in our community.  Due to her co-morbid illnesses, this patient is at least at moderate risk for complications without adequate follow up.  This format is felt to be most appropriate for this patient at this time.  All issues noted in this document were discussed and addressed.  A limited physical exam was performed with this format.  Please refer to the patient's chart for her consent to telehealth for [facility].  Evaluation Performed: Consultation  This visit type was conducted due to national recommendations for restrictions regarding the COVID-19 Pandemic (e.g. social distancing).  This format is felt to be most appropriate for this patient at this time.  All issues noted in this document were discussed and addressed.  No physical exam was performed (except for noted visual exam findings with Video Visits).  Please refer to the patient's chart (MyChart message for video visits and phone note for telephone visits) for the patient's consent to telehealth for [facility].  Date:  03/04/2019  ID: [name], DOB 11/23/1955, MRN [id number]   Patient Location: [address] [place] [address]   Provider location:   [place] Office  PCP:  Nicoletta Dress, MD  Cardiologist:  Jenne Campus, MD     Chief Complaint: I have a chest pain and palpitations  History of Present Illness:    DEANNDRA KIRLEY is a 63 y.o. female  who presents via audio/video conferencing for a telehealth visit today.  She was evaluated more than 3 years ago for some atypical chest pain.  The stress test was not done at that time she was eventually told that she got some kind of seizures and treated accordingly for last few weeks she is started  experiencing chest pain again she described as heavy dog she said sitting on her chest that happen multiple times a day there is no provoking or relieving factor she said when she take few deep breath that make help some it does not happen with exertion it can be happening when she is sitting lying or laying down.  It is not associated with shortness of breath there is no sweating associated with this sensation.  However initial reason why she was referred to [place] was palpitations when I started asking her about palpitation she said this is exactly what she feels I am not sure if she is clearly able to distinguish palpitations and between chest pain and palpitations.  She described the fact that she feels skipped beats she wear monitor which showed frequent ventricular ectopy with some runs of accelerated idioventricular rhythm.  She described the fact she feels skipped her heart skipping.  She cannot walk very well because some chronic problem with the vein and lower extremities her legs are simply painful when she walks.  She has been seeing vascular specialist and plans to do laser therapy on her legs being contemplated.   The patient does not have symptoms concerning for COVID-19 infection (fever, chills, cough, or new SHORTNESS OF BREATH).    Prior CV studies:   The following studies were reviewed today:  Monitor done on 27 April showed: Conclusion:  Ventricular ectopy with some ventricular triplets as well as periods of accelerated idioventricular rhythm.     Past Medical History:  Diagnosis Date  .  Arthritis    right hip  . Blood transfusion   . Cancer [facility])    SKIN CANCER  . Depression   . Headache(784.0)    migraines  . MSSA (methicillin susceptible Staphylococcus aureus) infection   . 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  . Varicose vein     Past Surgical History:  Procedure Laterality Date  . JOINT REPLACEMENT  08/2009   right hip replacement  .  REVISION TOTAL HIP ARTHROPLASTY    . TONSILLECTOMY  1963  . TOTAL HIP REVISION  10/08/2011   Procedure: TOTAL HIP REVISION;  Surgeon: Mauri Pole;  Location: [facility]S;  Service: Orthopedics;  Laterality: Right;  Resection of Right Total Hip/Extended Trochanteric Osteotomy/Placement of Antibiotic Total Hip Cemented by Depuy  . TOTAL HIP REVISION  03/10/2012   Procedure: TOTAL HIP REVISION;  Surgeon: Mauri Pole, MD;  Location: [facility];  Service: Orthopedics;  Laterality: Right;  Reimplantation/Revision of a Right Total Hip and Removal of Cemented Implant     Current Meds  Medication Sig  . buPROPion HCl (WELLBUTRIN PO) Take by mouth daily.  . DULoxetine (CYMBALTA) 60 MG capsule Take 60 mg by mouth at bedtime.   . Esomeprazole Magnesium (NEXIUM PO) Take by mouth every evening.  [name] Kitchen ibuprofen (ADVIL,MOTRIN) 800 MG tablet Take 800 mg by mouth 3 (three) times daily.    Current Facility-Administered Medications for the 03/04/19 encounter (Telemedicine) with Park Liter, MD  Medication  . triamcinolone acetonide (KENALOG-40) injection 20 mg      Family History: The patient's family history includes Alzheimer's disease in her father; Prostate cancer in her father; Seizures in her unknown relative.   ROS:   Please see the history of present illness.     All other systems reviewed and are negative.   Labs/Other Tests and Data Reviewed:     Recent Labs: No results found for requested labs within last 8760 hours.  Recent Lipid Panel No results found for: CHOL, TRIG, HDL, CHOLHDL, VLDL, LDLCALC, LDLDIRECT    Exam:    Vital Signs:  Ht 5\' 7"  (1.702 m)   Wt 157 lb (71.2 kg)   LMP 04/05/2011   BMI 24.59 kg/m     Wt Readings from Last 3 Encounters:  03/04/19 157 lb (71.2 kg)  11/06/18 283 lb (128.4 kg)  07/17/18 283 lb (128.4 kg)     Well nourished, well developed in no acute distress. Alert awake oriented x3 not in any distress she is happy to be able to talk to me over  the video link.  Diagnosis for this visit:   1. Precordial pain   2. Palpitations   3. Dyslipidemia   4. 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  Precordial chest pain atypical but need to be evaluated I will schedule her to have a stress test.  Asked her also to start taking one baby aspirin every single day. 2.  Palpitation she does have frequent ventricular ectopy.  Small dose of beta-blocker has been initiated asked her to increase metoprolol from 25 twice daily to 3 times a day.  I supported ventilation and risk factor stratification we will schedule her to have echocardiogram as well as stress test.  Based on that we will decide future therapy about extrasystole 3.  Dyslipidemia seems to be doing well from that point review we will call primary care physician to  get fasting lipid profile. 4.  Supposedly partial seizures however she said she does not think she has it.  COVID-19 Education: The signs and symptoms of COVID-19 were discussed with the patient and how to seek care for testing (follow up with PCP or arrange E-visit).  The importance of social distancing was discussed today.  Patient Risk:   After full review of this patients clinical status, I feel that they are at least moderate risk at this time.  Time:   Today, I have spent 20 minutes with the patient with telehealth technology discussing pt health issues.  I spent 5 minutes reviewing her chart before the visit.  Visit was finished at 3:17 PM.    Medication Adjustments/Labs and Tests Ordered: Current medicines are reviewed at length with the patient today.  Concerns regarding medicines are outlined above.  No orders of the defined types were placed in this encounter.  Medication changes: No orders of the defined types were placed in this encounter.    Disposition: Follow-up in 6 weeks  Signed, Park Liter, MD, [facility] 03/04/2019 3:16 PM    [place]

## 2019-03-09 ENCOUNTER — Telehealth: Payer: Self-pay | Admitting: Emergency Medicine

## 2019-03-09 ENCOUNTER — Other Ambulatory Visit: Payer: Self-pay

## 2019-03-09 ENCOUNTER — Ambulatory Visit (INDEPENDENT_AMBULATORY_CARE_PROVIDER_SITE_OTHER): Payer: BC Managed Care – PPO

## 2019-03-09 DIAGNOSIS — E785 Hyperlipidemia, unspecified: Secondary | ICD-10-CM | POA: Diagnosis not present

## 2019-03-09 DIAGNOSIS — R072 Precordial pain: Secondary | ICD-10-CM | POA: Diagnosis not present

## 2019-03-09 DIAGNOSIS — R002 Palpitations: Secondary | ICD-10-CM | POA: Diagnosis not present

## 2019-03-09 NOTE — Telephone Encounter (Signed)
Patient called back and was informed of results.

## 2019-03-09 NOTE — Progress Notes (Signed)
Complete echocardiogram has been performed.  Jimmy Haille Pardi RDCS, RVT 

## 2019-03-09 NOTE — Telephone Encounter (Signed)
Left message for patient to return call regarding echocardiogram results  

## 2019-03-10 ENCOUNTER — Other Ambulatory Visit: Payer: Self-pay | Admitting: *Deleted

## 2019-03-10 DIAGNOSIS — I83811 Varicose veins of right lower extremities with pain: Secondary | ICD-10-CM

## 2019-03-11 ENCOUNTER — Telehealth: Payer: Self-pay | Admitting: Cardiology

## 2019-03-11 ENCOUNTER — Other Ambulatory Visit: Payer: Self-pay | Admitting: Cardiology

## 2019-03-11 NOTE — Telephone Encounter (Incomplete)
°*  STAT* If patient is at the pharmacy, call can be transferred to refill team. ° ° °1. Which medications need to be refilled? (please list name of each medication and dose if known) ***** ° °2. Which pharmacy/location (including street and city if local pharmacy) is medication to be sent to?**** ° °3. Do they need a 30 day or 90 day supply? *** ° °

## 2019-03-11 NOTE — Telephone Encounter (Signed)
Pharmacy is asking for prior authorization on patient metoprolol. Please advise.

## 2019-03-11 NOTE — Telephone Encounter (Signed)
[facility] drug requesting current metoprolol dosage. Informed that current order is for metoprolol 25 mg twice daily.

## 2019-03-17 ENCOUNTER — Telehealth: Payer: Self-pay | Admitting: *Deleted

## 2019-03-17 NOTE — Telephone Encounter (Signed)
Patient given detailed instructions per Myocardial Perfusion Study Information Sheet for the test on 03/18/19. Patient notified to arrive 15 minutes early and that it is imperative to arrive on time for appointment to keep from having the test rescheduled.  If you need to cancel or reschedule your appointment, please call the office within 24 hours of your appointment. . Patient verbalized understanding. Kirstie Peri

## 2019-03-18 ENCOUNTER — Other Ambulatory Visit: Payer: Self-pay

## 2019-03-18 ENCOUNTER — Ambulatory Visit (INDEPENDENT_AMBULATORY_CARE_PROVIDER_SITE_OTHER): Payer: BC Managed Care – PPO

## 2019-03-18 DIAGNOSIS — E785 Hyperlipidemia, unspecified: Secondary | ICD-10-CM

## 2019-03-18 DIAGNOSIS — R072 Precordial pain: Secondary | ICD-10-CM

## 2019-03-18 DIAGNOSIS — R002 Palpitations: Secondary | ICD-10-CM

## 2019-03-18 LAB — MYOCARDIAL PERFUSION IMAGING
LV dias vol: 72 mL (ref 46–106)
LV sys vol: 22 mL
Peak HR: 95 {beats}/min
Rest HR: 84 {beats}/min
SDS: 1
SRS: 1
SSS: 2
TID: 1.36

## 2019-03-18 MED ORDER — TECHNETIUM TC 99M TETROFOSMIN IV KIT
32.1000 | PACK | Freq: Once | INTRAVENOUS | Status: AC | PRN
  Administered 2019-03-18: 32.1 via INTRAVENOUS

## 2019-03-18 MED ORDER — TECHNETIUM TC 99M TETROFOSMIN IV KIT
9.9000 | PACK | Freq: Once | INTRAVENOUS | Status: AC | PRN
  Administered 2019-03-18: 9.9 via INTRAVENOUS

## 2019-03-18 MED ORDER — REGADENOSON 0.4 MG/5ML IV SOLN
0.4000 mg | Freq: Once | INTRAVENOUS | Status: AC
  Administered 2019-03-18: 0.4 mg via INTRAVENOUS

## 2019-03-20 ENCOUNTER — Telehealth: Payer: Self-pay

## 2019-03-20 NOTE — Telephone Encounter (Signed)
Left message to call for Nuclear results

## 2019-04-02 ENCOUNTER — Other Ambulatory Visit: Payer: Self-pay

## 2019-04-02 ENCOUNTER — Encounter: Payer: Self-pay | Admitting: Vascular Surgery

## 2019-04-02 ENCOUNTER — Ambulatory Visit: Payer: BC Managed Care – PPO | Admitting: Vascular Surgery

## 2019-04-02 VITALS — BP 126/78 | HR 88 | Temp 98.6°F | Resp 16 | Ht 67.0 in | Wt 263.3 lb

## 2019-04-02 DIAGNOSIS — I83811 Varicose veins of right lower extremities with pain: Secondary | ICD-10-CM | POA: Diagnosis not present

## 2019-04-02 HISTORY — PX: ENDOVENOUS ABLATION SAPHENOUS VEIN W/ LASER: SUR449

## 2019-04-02 NOTE — Progress Notes (Signed)
Patient name: [name] MRN: [id number] DOB: 1956-05-01 Sex: female  REASON FOR VISIT:   For endovenous laser ablation of right great saphenous vein  HPI:   [name] is a pleasant 63 y.o. female who I last saw on 11/06/2018.  The patient has CEAP C4 venous disease.  On the right side the patient had significant reflux in the great saphenous vein from the proximal thigh down to the knee.  The vein was significantly dilated.  The patient had failed conservative measures and now presents for laser ablation of the right great saphenous vein.  Current Outpatient Medications  Medication Sig Dispense Refill  . buPROPion HCl (WELLBUTRIN PO) Take by mouth daily.    . DULoxetine (CYMBALTA) 60 MG capsule Take 60 mg by mouth at bedtime.     . Esomeprazole Magnesium (NEXIUM PO) Take by mouth every evening.    [name] Kitchen ibuprofen (ADVIL,MOTRIN) 800 MG tablet Take 800 mg by mouth 3 (three) times daily.     . metoprolol succinate (TOPROL-XL) 25 MG 24 hr tablet Take 2 tablets (50 mg total) by mouth daily. Take with or immediately following a meal. 180 tablet 1   Current Facility-Administered Medications  Medication Dose Route Frequency Provider Last Rate Last Dose  . triamcinolone acetonide (KENALOG-40) injection 20 mg  20 mg Other Once Landis Martins, DPM        REVIEW OF SYSTEMS:  [X]  denotes positive finding, [ ]  denotes negative finding Vascular    Leg swelling    Cardiac    Chest pain or chest pressure:    Shortness of breath upon exertion:    Short of breath when lying flat:    Irregular heart rhythm:    Constitutional    Fever or chills:     PHYSICAL EXAM:   Vitals:   04/02/19 1043  BP: 126/78  Pulse: 88  Resp: 16  Temp: 98.6 F (37 C)  TempSrc: Oral  SpO2: 96%  Weight: 263 lb 4.8 oz (119.4 kg)  Height: 5\' 7"  (1.702 m)    GENERAL: The patient is a well-nourished female, in no acute distress. The vital signs are documented above.  DATA:   No new data  MEDICAL ISSUES:   ENDOVENOUS LASER ABLATION RIGHT GREAT SAPHENOUS VEIN: The patient was taken to the exam room and placed supine.  I examined the right great saphenous vein with the SonoSite.  It looks like the best place to cannulate the vein was just above the knee.  Below that there was a large tributary.  The right leg was prepped and draped in usual sterile fashion.  Under ultrasound guidance, after the skin was anesthetized, I cannulated the great saphenous vein just above the knee with a micropuncture needle and a micropuncture sheath was introduced over the wire.  The J-wire was then advanced up to the saphenofemoral junction.  The sheath was advanced over the wire and then the dilator removed.  The end of the sheath was positioned just below the takeoff of the superficial epigastric vein.  The laser fiber was then advanced through the sheath until it exited the sheath.  Steri-Strips were placed to mark this.  Next tumescent anesthesia was administered circumferentially around the entire vein from the knee to the saphenofemoral junction.  The patient was then placed in Trendelenburg.  Endovenous laser ablation was performed of the left great saphenous vein from just distal to the superficial epigastric vein to just above the knee.  A total of 2180 J  were used.  Of note I used 45 W of energy.  Patient tolerated the procedure well.  Sterile dressing was applied.  She will return in 2 weeks for follow-up duplex.  [name] [facility] [phone]

## 2019-04-02 NOTE — Progress Notes (Signed)
     Laser Ablation Procedure    Date: 04/02/2019   [name] DOB:[birth year]  Consent signed: Yes    Surgeon:  Dr. [name]  Procedure: Laser Ablation: right Greater Saphenous Vein  BP 126/78 (BP Location: Left Arm, Patient Position: Sitting, Cuff Size: Large)   Pulse 88   Temp 98.6 F (37 C) (Oral)   Resp 16   Ht 5\' 7"  (1.702 m)   Wt 263 lb 4.8 oz (119.4 kg)   LMP 04/05/2011   SpO2 96%   BMI 41.24 kg/m   Tumescent Anesthesia: 475 cc 0.9% NaCl with 50 cc Lidocaine HCL 1%  and 15 cc 8.4% NaHCO3  Local Anesthesia: 7 cc Lidocaine HCL and NaHCO3 (ratio 2:1)  17 watts continuous mode        Total energy: 2180 Joules   Total time: 2:07   Patient tolerated procedure well  Notes: [name] wore face mask. All staff members wore face masks and face shields.  Description of Procedure:  After marking the course of the secondary varicosities, the patient was placed on the operating table in the supine position, and the right leg was prepped and draped in sterile fashion.   Local anesthetic was administered and under ultrasound guidance the saphenous vein was accessed with a micro needle and guide wire; then the mirco puncture sheath was placed.  A guide wire was inserted saphenofemoral junction , followed by a 5 french sheath.  The position of the sheath and then the laser fiber below the junction was confirmed using the ultrasound.  Tumescent anesthesia was administered along the course of the saphenous vein using ultrasound guidance. The patient was placed in Trendelenburg position and protective laser glasses were placed on patient and staff, and the laser was fired at 17 watts continuous mode advancing 1-63mm/second for a total of 2180 joules.     Steri strip was applied to the IV insertion site  and ABD pads and thigh high compression stockings were applied.  Ace wrap bandages were applied  at the top of the saphenofemoral junction. Blood loss was less than 15 cc.  The  patient ambulated out of the operating room having tolerated the procedure well.

## 2019-04-15 ENCOUNTER — Telehealth (HOSPITAL_COMMUNITY): Payer: Self-pay | Admitting: Rehabilitation

## 2019-04-15 NOTE — Telephone Encounter (Signed)

## 2019-04-16 ENCOUNTER — Encounter: Payer: Self-pay | Admitting: Vascular Surgery

## 2019-04-16 ENCOUNTER — Ambulatory Visit (HOSPITAL_COMMUNITY)
Admission: RE | Admit: 2019-04-16 | Discharge: 2019-04-16 | Disposition: A | Discharge status: Home / Self Care | Payer: BC Managed Care – PPO | Source: Ambulatory Visit | Attending: Vascular Surgery | Admitting: Vascular Surgery

## 2019-04-16 ENCOUNTER — Other Ambulatory Visit: Payer: Self-pay

## 2019-04-16 ENCOUNTER — Ambulatory Visit (INDEPENDENT_AMBULATORY_CARE_PROVIDER_SITE_OTHER): Payer: BC Managed Care – PPO | Admitting: Vascular Surgery

## 2019-04-16 VITALS — BP 120/74 | HR 85 | Temp 97.8°F | Resp 16

## 2019-04-16 DIAGNOSIS — Z48812 Encounter for surgical aftercare following surgery on the circulatory system: Secondary | ICD-10-CM | POA: Diagnosis not present

## 2019-04-16 DIAGNOSIS — I83811 Varicose veins of right lower extremities with pain: Secondary | ICD-10-CM

## 2019-04-16 NOTE — Progress Notes (Signed)
   Patient name: [name] MRN: [id number] DOB: Sep 21, 1956 Sex: female  REASON FOR VISIT:   Follow-up after laser ablation.  HPI:   [name] is a pleasant 63 y.o. female who underwent endovenous laser ablation of the right great saphenous vein on 04/02/2019.  Patient comes in for a two-week follow-up visit.  The patient has no complaints and states that the aching heaviness in her legs has improved significantly since her procedure.  She is also been elevating her legs routinely and will continue to wear her compression stockings as needed.  I have given her permission to get back in the pool which is also helpful for patients with venous disease.  She denies chest pain or shortness of breath.  Current Outpatient Medications  Medication Sig Dispense Refill  . buPROPion HCl (WELLBUTRIN PO) Take by mouth daily.    . DULoxetine (CYMBALTA) 60 MG capsule Take 60 mg by mouth at bedtime.     . Esomeprazole Magnesium (NEXIUM PO) Take by mouth every evening.    [name] Kitchen ibuprofen (ADVIL,MOTRIN) 800 MG tablet Take 800 mg by mouth 3 (three) times daily.     . metoprolol succinate (TOPROL-XL) 25 MG 24 hr tablet Take 2 tablets (50 mg total) by mouth daily. Take with or immediately following a meal. 180 tablet 1   Current Facility-Administered Medications  Medication Dose Route Frequency Provider Last Rate Last Dose  . triamcinolone acetonide (KENALOG-40) injection 20 mg  20 mg Other Once Landis Martins, DPM        REVIEW OF SYSTEMS:  [X]  denotes positive finding, [ ]  denotes negative finding Vascular    Leg swelling    Cardiac    Chest pain or chest pressure:    Shortness of breath upon exertion:    Short of breath when lying flat:    Irregular heart rhythm:    Constitutional    Fever or chills:     PHYSICAL EXAM:   Vitals:   04/16/19 1044  BP: 120/74  Pulse: 85  Resp: 16  Temp: 97.8 F (36.6 C)  TempSrc: Temporal  SpO2: 95%   GENERAL: The patient is a well-nourished female, in no  acute distress. The vital signs are documented above. CARDIOVASCULAR: There is a regular rate and rhythm. PULMONARY: There is good air exchange bilaterally without wheezing or rales. VASCULAR: Her bruising has essentially resolved at this point.  Her swelling is improved in the right leg.  DATA:   VENOUS DUPLEX: I have independently interpreted her venous duplex scan.  These vein is successfully closed up to the saphenofemoral junction.  There is no evidence of DVT.  MEDICAL ISSUES:   STATUS POST LASER ABLATION OF THE RIGHT GREAT SAPHENOUS VEIN: The patient is doing well status post laser ablation of the right great saphenous vein.  The vein is successfully closed with no evidence of DVT.  We have again discussed the importance of conservative measures for treatment of venous disease including leg elevation, compression stockings, exercise, and avoiding prolonged sitting and standing.  I will see her back as needed.  [name] [facility] [phone]

## 2019-04-21 ENCOUNTER — Telehealth (INDEPENDENT_AMBULATORY_CARE_PROVIDER_SITE_OTHER): Payer: BC Managed Care – PPO | Admitting: Cardiology

## 2019-04-21 ENCOUNTER — Other Ambulatory Visit: Payer: Self-pay

## 2019-04-21 ENCOUNTER — Telehealth: Payer: Self-pay

## 2019-04-21 ENCOUNTER — Encounter: Payer: Self-pay | Admitting: Cardiology

## 2019-04-21 DIAGNOSIS — R002 Palpitations: Secondary | ICD-10-CM

## 2019-04-21 DIAGNOSIS — R0789 Other chest pain: Secondary | ICD-10-CM

## 2019-04-21 DIAGNOSIS — E785 Hyperlipidemia, unspecified: Secondary | ICD-10-CM

## 2019-04-21 NOTE — Telephone Encounter (Signed)
Trying to contact patient to schedule 5 month follow up for Dr. Agustin Cree.

## 2019-04-21 NOTE — Patient Instructions (Signed)
Medication Instructions:  Your physician recommends that you continue on your current medications as directed. Please refer to the Current Medication list given to you today.  If you need a refill on your cardiac medications before your next appointment, please call your pharmacy.   Lab work: None ordered If you have labs (blood work) drawn today and your tests are completely normal, you will receive your results only by: . MyChart Message (if you have MyChart) OR . A paper copy in the mail If you have any lab test that is abnormal or we need to change your treatment, we will call you to review the results.  Testing/Procedures: None ordered  Follow-Up: At [facility], you and your health needs are our priority.  As part of our continuing mission to provide you with exceptional heart care, we have created designated Provider Care Teams.  These Care Teams include your primary Cardiologist (physician) and Advanced Practice Providers (APPs -  Physician Assistants and Nurse Practitioners) who all work together to provide you with the care you need, when you need it. You will need a follow up appointment in 5 months.   You may see Robert Krasowski or another member of our [facility] Provider Team in [place]: Brian Munley, MD . Rajan Revankar, MD     

## 2019-06-03 NOTE — Progress Notes (Signed)
Virtual Visit via Video Note   This visit type was conducted due to national recommendations for restrictions regarding the COVID-19 Pandemic (e.g. social distancing) in an effort to limit this patient's exposure and mitigate transmission in our community.  Due to her co-morbid illnesses, this patient is at least at moderate risk for complications without adequate follow up.  This format is felt to be most appropriate for this patient at this time.  All issues noted in this document were discussed and addressed.  A limited physical exam was performed with this format.  Please refer to the patient's chart for her consent to telehealth for [facility].  Evaluation Performed:  Follow-up visit  This visit type was conducted due to national recommendations for restrictions regarding the COVID-19 Pandemic (e.g. social distancing).  This format is felt to be most appropriate for this patient at this time.  All issues noted in this document were discussed and addressed.  No physical exam was performed (except for noted visual exam findings with Video Visits).  Please refer to the patient's chart (MyChart message for video visits and phone note for telephone visits) for the patient's consent to telehealth for [facility].  Date:  06/03/2019  ID: [name], DOB 10-Jan-1956, MRN [id number]   Patient Location: [address] [place] [address]   Provider location:   [place] Office  PCP:  Nicoletta Dress, MD  Cardiologist:  Jenne Campus, MD     Chief Complaint: Doing very well  History of Present Illness:    [name] is a 63 y.o. female  who presents via audio/video conferencing for a telehealth visit today.  With atypical chest pain, essential hypertension, dyslipidemia, palpitation.  She has a video visit with me today.  Overall doing well denies have any chest pain tightness squeezing pressure burning chest no palpitations.  The purpose of the visit is  discussed results of her test.  She did have a stress test which was negative for exercise-induced myocardial ischemia.  She is very happy with it.   The patient does not have symptoms concerning for COVID-19 infection (fever, chills, cough, or new SHORTNESS OF BREATH).    Prior CV studies:   The following studies were reviewed today:  Stress test which was negative.     Past Medical History:  Diagnosis Date  . Arthritis    right hip  . Blood transfusion   . Cancer [facility])    SKIN CANCER  . Depression   . Headache(784.0)    migraines  . MSSA (methicillin susceptible Staphylococcus aureus) infection   . T.T.P. syndrome ([place])    20 yrs ago-not seen anyone for 10 yrs.  . Varicose vein     Past Surgical History:  Procedure Laterality Date  . ENDOVENOUS ABLATION SAPHENOUS VEIN W/ LASER Right 04/02/2019   endovenous laser ablation right greater saphenous vein by [name] MD   . JOINT REPLACEMENT  08/2009   right hip replacement  . REVISION TOTAL HIP ARTHROPLASTY    . TONSILLECTOMY  1963  . TOTAL HIP REVISION  10/08/2011   Procedure: TOTAL HIP REVISION;  Surgeon: Mauri Pole;  Location: [facility];  Service: Orthopedics;  Laterality: Right;  Resection of Right Total Hip/Extended Trochanteric Osteotomy/Placement of Antibiotic Total Hip Cemented by [place]  . TOTAL HIP REVISION  03/10/2012   Procedure: TOTAL HIP REVISION;  Surgeon: Mauri Pole, MD;  Location: [facility];  Service: Orthopedics;  Laterality: Right;  Reimplantation/Revision  of a Right Total Hip and Removal of Cemented Implant     Current Meds  Medication Sig  . buPROPion HCl (WELLBUTRIN PO) Take by mouth daily.  . DULoxetine (CYMBALTA) 60 MG capsule Take 60 mg by mouth at bedtime.   . Esomeprazole Magnesium (NEXIUM PO) Take by mouth every evening.  [name] Kitchen ibuprofen (ADVIL,MOTRIN) 800 MG tablet Take 800 mg by mouth 3 (three) times daily.    Current Facility-Administered Medications for the 04/21/19 encounter  (Telemedicine) with Park Liter, MD  Medication  . triamcinolone acetonide (KENALOG-40) injection 20 mg      Family History: The patient's family history includes Alzheimer's disease in her father; Prostate cancer in her father; Seizures in an other family member.   ROS:   Please see the history of present illness.     All other systems reviewed and are negative.   Labs/Other Tests and Data Reviewed:     Recent Labs: No results found for requested labs within last 8760 hours.  Recent Lipid Panel No results found for: CHOL, TRIG, HDL, CHOLHDL, VLDL, LDLCALC, LDLDIRECT    Exam:    Vital Signs:  LMP 04/05/2011     Wt Readings from Last 3 Encounters:  04/02/19 263 lb 4.8 oz (119.4 kg)  03/18/19 259 lb (117.5 kg)  03/04/19 157 lb (71.2 kg)     Well nourished, well developed in no acute distress. Alert awake in exam 3.  Not in any distress.  Diagnosis for this visit:   1. Palpitations   2. Dyslipidemia   3. Atypical chest pain      ASSESSMENT & PLAN:    1.  Atypical chest pain.  Denies having any.  Recent stress test negative.  We will continue risk factors modifications. 2.  Dyslipidemia I am doing well from that point review I will call primary care physician to get her fasting to be profile 3.  Palpitations controlled better with increased dose of beta-blocker.  COVID-19 Education: The signs and symptoms of COVID-19 were discussed with the patient and how to seek care for testing (follow up with PCP or arrange E-visit).  The importance of social distancing was discussed today.  Patient Risk:   After full review of this patients clinical status, I feel that they are at least moderate risk at this time.  Time:   Today, I have spent 15 minutes with the patient with telehealth technology discussing pt health issues.  I spent 5 minutes reviewing her chart before the visit.     Medication Adjustments/Labs and Tests Ordered: Current medicines are reviewed  at length with the patient today.  Concerns regarding medicines are outlined above.  No orders of the defined types were placed in this encounter.  Medication changes: No orders of the defined types were placed in this encounter.    Disposition: Follow-up 3 months  Signed, Park Liter, MD, [facility] 04/21/2019 11:07 AM    [place]

## 2020-02-08 DIAGNOSIS — Z01818 Encounter for other preprocedural examination: Secondary | ICD-10-CM

## 2021-06-15 ENCOUNTER — Ambulatory Visit: Payer: BC Managed Care – PPO | Admitting: Physician Assistant

## 2022-07-16 ENCOUNTER — Other Ambulatory Visit: Payer: Self-pay

## 2022-07-16 DIAGNOSIS — I872 Venous insufficiency (chronic) (peripheral): Secondary | ICD-10-CM | POA: Insufficient documentation

## 2022-07-16 DIAGNOSIS — R7309 Other abnormal glucose: Secondary | ICD-10-CM | POA: Insufficient documentation

## 2022-07-16 DIAGNOSIS — K219 Gastro-esophageal reflux disease without esophagitis: Secondary | ICD-10-CM | POA: Insufficient documentation

## 2022-07-16 DIAGNOSIS — E559 Vitamin D deficiency, unspecified: Secondary | ICD-10-CM | POA: Insufficient documentation

## 2022-07-16 DIAGNOSIS — A4901 Methicillin susceptible Staphylococcus aureus infection, unspecified site: Secondary | ICD-10-CM | POA: Insufficient documentation

## 2022-07-16 DIAGNOSIS — R600 Localized edema: Secondary | ICD-10-CM | POA: Insufficient documentation

## 2022-07-16 DIAGNOSIS — R55 Syncope and collapse: Secondary | ICD-10-CM | POA: Insufficient documentation

## 2022-07-16 DIAGNOSIS — Z862 Personal history of diseases of the blood and blood-forming organs and certain disorders involving the immune mechanism: Secondary | ICD-10-CM | POA: Insufficient documentation

## 2022-07-16 DIAGNOSIS — M797 Fibromyalgia: Secondary | ICD-10-CM | POA: Insufficient documentation

## 2022-07-16 DIAGNOSIS — F331 Major depressive disorder, recurrent, moderate: Secondary | ICD-10-CM | POA: Insufficient documentation

## 2022-07-16 DIAGNOSIS — E876 Hypokalemia: Secondary | ICD-10-CM | POA: Insufficient documentation

## 2022-07-16 DIAGNOSIS — N761 Subacute and chronic vaginitis: Secondary | ICD-10-CM | POA: Insufficient documentation

## 2022-07-16 DIAGNOSIS — M7662 Achilles tendinitis, left leg: Secondary | ICD-10-CM | POA: Insufficient documentation

## 2022-07-16 DIAGNOSIS — F32A Depression, unspecified: Secondary | ICD-10-CM | POA: Insufficient documentation

## 2022-07-16 DIAGNOSIS — Z780 Asymptomatic menopausal state: Secondary | ICD-10-CM | POA: Insufficient documentation

## 2022-07-16 DIAGNOSIS — M1990 Unspecified osteoarthritis, unspecified site: Secondary | ICD-10-CM | POA: Insufficient documentation

## 2022-07-16 DIAGNOSIS — M3119 Other thrombotic microangiopathy: Secondary | ICD-10-CM | POA: Insufficient documentation

## 2022-07-16 DIAGNOSIS — C801 Malignant (primary) neoplasm, unspecified: Secondary | ICD-10-CM | POA: Insufficient documentation

## 2022-07-16 DIAGNOSIS — Z6839 Body mass index (BMI) 39.0-39.9, adult: Secondary | ICD-10-CM | POA: Insufficient documentation

## 2022-07-24 DIAGNOSIS — M25511 Pain in right shoulder: Secondary | ICD-10-CM | POA: Diagnosis not present

## 2022-07-24 DIAGNOSIS — M65332 Trigger finger, left middle finger: Secondary | ICD-10-CM | POA: Diagnosis not present

## 2022-07-25 ENCOUNTER — Ambulatory Visit: Payer: Medicare PPO | Attending: Cardiology | Admitting: Cardiology

## 2022-07-25 ENCOUNTER — Ambulatory Visit: Payer: Medicare PPO | Attending: Cardiology

## 2022-07-25 ENCOUNTER — Encounter: Payer: Self-pay | Admitting: Cardiology

## 2022-07-25 VITALS — BP 94/66 | HR 95 | Ht 66.6 in | Wt 228.6 lb

## 2022-07-25 DIAGNOSIS — E669 Obesity, unspecified: Secondary | ICD-10-CM | POA: Diagnosis not present

## 2022-07-25 DIAGNOSIS — R002 Palpitations: Secondary | ICD-10-CM | POA: Diagnosis not present

## 2022-07-25 DIAGNOSIS — R55 Syncope and collapse: Secondary | ICD-10-CM

## 2022-07-25 DIAGNOSIS — R0609 Other forms of dyspnea: Secondary | ICD-10-CM

## 2022-07-25 DIAGNOSIS — R0602 Shortness of breath: Secondary | ICD-10-CM | POA: Insufficient documentation

## 2022-07-25 NOTE — Progress Notes (Signed)
Cardiology Office Note:    Date:  07/25/2022   ID:  [name], DOB Jan 23, 1956, MRN [id number]  PCP:  Lowella Dandy, NP  Cardiologist:  Jenean Lindau, MD   Referring MD: Lowella Dandy, NP    ASSESSMENT:    1. Palpitations   2. Syncope and collapse   3. Obesity (BMI 35.0-39.9 without comorbidity)   4. DOE (dyspnea on exertion)    PLAN:    In order of problems listed above:  Primary prevention stressed with the patient.  Importance of compliance with diet medication stressed and she vocalized understanding. Cardiac murmur: Echocardiogram will be done to assess murmur heard on auscultation. Syncope: Unclear etiology.  Could be possibly related to her nausea.  I will evaluate this. Palpitations: We will do a 2-week monitor.  I reviewed cardiac evaluation done in 2020 at length and discussed with her. Dyspnea on exertion: I will do a exercise stress Cardiolite to see if there is any coronary element to her symptoms.  I doubt this. Obese: Weight reduction stressed and diet was emphasized and she promises to do better. Patient will be seen in follow-up appointment in 6 months or earlier if the patient has any concerns.  It is to be noted that her blood pressure is borderline but she has no symptoms from this.  I specifically asked about postural hypotension and she answered in the negative.  Salt and fluid intake was encouraged to some extent.   Medication Adjustments/Labs and Tests Ordered: Current medicines are reviewed at length with the patient today.  Concerns regarding medicines are outlined above.  No orders of the defined types were placed in this encounter.  No orders of the defined types were placed in this encounter.    History of Present Illness:    [name] is a 66 y.o. female who is being seen today for the evaluation of syncope at the request of [name], [name], NP.  Patient is a pleasant 66 year old female.  She has past medical history of mild mixed dyslipidemia.   Overall she leads a sedentary lifestyle.  She mentions to me that she felt sick on her stomach and passed out.  That is what she remembers.  No orthopnea or PND.  She takes care of activities of daily living.  Overall she leads a sedentary lifestyle.  She has dyspnea on exertion.  At the time of my evaluation, the patient is alert awake oriented and in no distress.  Occasionally she has palpitations.  Past Medical History:  Diagnosis Date   Abnormal glucose    Achilles tendinitis of left lower extremity    Acute suppurative arthritis due to bacteria ([place]) 03/09/2011   Overview:  Last Assessment & Plan:  Methicillin sensitive staphylococcal aureus septic hip. She clearly does need I&D of the hip. I will have her continue the doxycycline for now but stop it 7 days prior to her surgery to maximize the yield on the cultures in the operating room though I be shocked if we don't find methicillin sensitive staph aureus again.. I agree with removing as much of the pros   Anal fissure 11/30/2015   Arthritis    right hip   Atypical chest pain 04/09/2016   Bilateral edema of lower extremity    BMI 39.0-39.9,adult    Cancer [facility])    SKIN CANCER   Carpal tunnel syndrome 03/09/2011   Overview:  Last Assessment & Plan:  Clinically this seems to be carpal tunnel syndrome.  Giving given her history of infection though we can keep in the back of our mind the idea that she will have a cervical spine problem but I think this is unlikely at this point in time medics carpal tunnel syndrome fits clinically this was a positive Tinel's sign however in for a brace for her.   Chronic vaginitis    Chronic venous insufficiency    Depression    Dyslipidemia    Fatigue 10/13/2015   Fibromyalgia    GERD without esophagitis    History of immune thrombocytopenia 03/09/2011   History of operative procedure on hip 03/10/2012   History of TTP (thrombotic thrombocytopenic purpura)    Hypokalemia    Lichen sclerosus et atrophicus of  the vulva 11/30/2015   Major depressive disorder, recurrent, moderate (HCC)    Methicillin susceptible Staphylococcus aureus infection 03/09/2011   Overview:  Last Assessment & Plan:  This is undoubtedlythe culprit organism   Morbid obesity with BMI of 45.0-49.9, adult ([place]) 10/13/2015   Morbidly obese ([place])    MSSA (methicillin susceptible Staphylococcus aureus) infection    Near syncope    Osteoarthritis, chronic    Pain due to total hip replacement ([place]) 09/20/2011   Palpitations 03/04/2019   Postmenopausal    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 syndrome ([place])    20 yrs ago-not seen anyone for 10 yrs.   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    Past Surgical History:  Procedure Laterality Date   ENDOVENOUS ABLATION SAPHENOUS VEIN W/ LASER Right 04/02/2019   endovenous laser ablation right greater saphenous vein by [name] MD    JOINT REPLACEMENT  08/2009   right hip replacement   REVISION TOTAL HIP ARTHROPLASTY     TONSILLECTOMY  1963   TOTAL HIP REVISION  10/08/2011   Procedure: TOTAL HIP REVISION;  Surgeon: Mauri Pole;  [facility];  Service: Orthopedics;  Laterality: Right;  Resection of Right Total Hip/Extended Trochanteric Osteotomy/Placement of Antibiotic Total Hip Cemented by [place]  03/10/2012   Procedure: TOTAL HIP REVISION;  Surgeon: Mauri Pole, MD;  [facility];  Service: Orthopedics;  Laterality: Right;  Reimplantation/Revision of a Right Total Hip and Removal of Cemented Implant    Current Medications: Current Meds  Medication Sig   ascorbic acid (VITAMIN C) 1000 MG tablet Take 1,000 mg by mouth daily.   buPROPion (WELLBUTRIN XL) 150 MG 24 hr tablet Take 150 mg by mouth every evening.   DULoxetine (CYMBALTA) 60 MG capsule Take 60 mg by mouth at bedtime.    esomeprazole (NEXIUM) 40 MG capsule Take 40 mg by mouth  daily.   GEMTESA 75 MG TABS Take 1 tablet by mouth daily.   ibuprofen (ADVIL,MOTRIN) 800 MG tablet Take 800 mg by mouth as needed for headache or mild pain.   Vitamin D, Ergocalciferol, (DRISDOL) 1.25 MG (50000 UNIT) CAPS capsule Take 50,000 Units by mouth once a week.   Current Facility-Administered Medications for the 07/25/22 encounter (Office Visit) with Tarun Patchell, Reita Cliche, MD  Medication   triamcinolone acetonide (KENALOG-40) injection 20 mg     Allergies:   Patient has no known allergies.   Social History   Socioeconomic History   Marital status: Married    Spouse name: Not on file   Number of children: Not on file   Years of education: Not on file  Highest education level: Not on file  Occupational History   Occupation: Product manager: [employer]  Tobacco Use   Smoking status: Former    Packs/day: 1.50    Years: 20.00    Total pack years: 30.00    Types: Cigarettes    Quit date: 10/04/1990    Years since quitting: 31.8   Smokeless tobacco: Former    Quit date: 03/09/1991  Substance and Sexual Activity   Alcohol use: Yes    Comment: OCCASIONAL   Drug use: No   Sexual activity: Yes    Partners: Male  Other Topics Concern   Not on file  Social History Narrative   Not on file   Social Determinants of Health   Financial Resource Strain: Not on file  Food Insecurity: Not on file  Transportation Needs: Not on file  Physical Activity: Not on file  Stress: Not on file  Social Connections: Not on file     Family History: The patient's family history includes Alzheimer's disease in her father; Hypertension in her father, maternal grandfather, and mother; Prostate cancer in her father; Seizures in an other family member.  ROS:   Please see the history of present illness.    All other systems reviewed and are negative.  EKGs/Labs/Other Studies Reviewed:    The following studies were reviewed today: EKG reveals sinus tachycardia   Recent  Labs: No results found for requested labs within last 365 days.  Recent Lipid Panel No results found for: "CHOL", "TRIG", "HDL", "CHOLHDL", "VLDL", "LDLCALC", "LDLDIRECT"  Physical Exam:    VS:  BP 94/66   Pulse 95   Ht 5' 6.6" (1.692 m)   Wt 228 lb 9.6 oz (103.7 kg)   LMP 04/05/2011   SpO2 94%   BMI 36.24 kg/m     Wt Readings from Last 3 Encounters:  07/25/22 228 lb 9.6 oz (103.7 kg)  04/02/19 263 lb 4.8 oz (119.4 kg)  03/18/19 259 lb (117.5 kg)     GEN: Patient is in no acute distress HEENT: Normal NECK: No JVD; No carotid bruits LYMPHATICS: No lymphadenopathy CARDIAC: S1 S2 regular, 2/6 systolic murmur at the apex. RESPIRATORY:  Clear to auscultation without rales, wheezing or rhonchi  ABDOMEN: Soft, non-tender, non-distended MUSCULOSKELETAL:  No edema; No deformity  SKIN: Warm and dry NEUROLOGIC:  Alert and oriented x 3 PSYCHIATRIC:  Normal affect    Signed, Jenean Lindau, MD  07/25/2022 4:14 PM    [facility]

## 2022-07-25 NOTE — Addendum Note (Signed)
Addended by: Truddie Hidden on: 07/25/2022 04:35 PM   Modules accepted: Orders

## 2022-07-25 NOTE — Addendum Note (Signed)
Addended by: Jyl Heinz R on: 07/25/2022 04:36 PM   Modules accepted: Orders

## 2022-07-25 NOTE — Patient Instructions (Signed)
Medication Instructions:  Your physician recommends that you continue on your current medications as directed. Please refer to the Current Medication list given to you today.  *If you need a refill on your cardiac medications before your next appointment, please call your pharmacy*   Lab Work: None ordered If you have labs (blood work) drawn today and your tests are completely normal, you will receive your results only by: [place] (if you have MyChart) OR A paper copy in the mail If you have any lab test that is abnormal or we need to change your treatment, we will call you to review the results.   Testing/Procedures: Your physician has requested that you have a lexiscan myoview. For further information please visit [email or web address]. Please follow instruction sheet, as given.  The test will take approximately 3 to 4 hours to complete; you may bring reading material.  If someone comes with you to your appointment, they will need to remain in the main lobby due to limited space in the testing area.   How to prepare for your Myocardial Perfusion Test: Do not eat or drink 3 hours prior to your test, except you may have water. Do not consume products containing caffeine (regular or decaffeinated) 12 hours prior to your test. (ex: coffee, chocolate, sodas, tea). Do bring a list of your current medications with you.  If not listed below, you may take your medications as normal. Do wear comfortable clothes (no dresses or overalls) and walking shoes, tennis shoes preferred (No heels or open toe shoes are allowed). Do NOT wear cologne, perfume, aftershave, or lotions (deodorant is allowed). If these instructions are not followed, your test will have to be rescheduled.    Follow-Up: At [facility], you and your health needs are our priority.  As part of our continuing mission to provide you with exceptional heart care, we have created designated Provider Care Teams.  These Care Teams  include your primary Cardiologist (physician) and Advanced Practice Providers (APPs -  Physician Assistants and Nurse Practitioners) who all work together to provide you with the care you need, when you need it.  We recommend signing up for the patient portal called "MyChart".  Sign up information is provided on this After Visit Summary.  MyChart is used to connect with patients for Virtual Visits (Telemedicine).  Patients are able to view lab/test results, encounter notes, upcoming appointments, etc.  Non-urgent messages can be sent to your provider as well.   To learn more about what you can do with MyChart, go to [email or web address].    Your next appointment:   6 month(s)  The format for your next appointment:   In Person  Provider:   Jyl Heinz, MD   Other Instructions Cardiac Nuclear Scan A cardiac nuclear scan is a test that is done to check the flow of blood to your heart. It is done when you are resting and when you are exercising. The test looks for problems such as: Not enough blood reaching a portion of the heart. The heart muscle not working as it should. You may need this test if: You have heart disease. You have had lab results that are not normal. You have had heart surgery or a balloon procedure to open up blocked arteries (angioplasty). You have chest pain. You have shortness of breath. In this test, a special dye (tracer) is put into your bloodstream. The tracer will travel to your heart. A camera will then take pictures of your heart to see  how the tracer moves through your heart. This test is usually done at a hospital and takes 2-4 hours. Tell a doctor about: Any allergies you have. All medicines you are taking, including vitamins, herbs, eye drops, creams, and over-the-counter medicines. Any problems you or family members have had with anesthetic medicines. Any blood disorders you have. Any surgeries you have had. Any medical conditions you  have. Whether you are pregnant or may be pregnant. What are the risks? Generally, this is a safe test. However, problems may occur, such as: Serious chest pain and heart attack. This is only a risk if the stress portion of the test is done. Rapid heartbeat. A feeling of warmth in your chest. This feeling usually does not last long. Allergic reaction to the tracer. What happens before the test? Ask your doctor about changing or stopping your normal medicines. This is important. Follow instructions from your doctor about what you cannot eat or drink. Remove your jewelry on the day of the test. What happens during the test? An IV tube will be inserted into one of your veins. Your doctor will give you a small amount of tracer through the IV tube. You will wait for 20-40 minutes while the tracer moves through your bloodstream. Your heart will be monitored with an electrocardiogram (ECG). You will lie down on an exam table. Pictures of your heart will be taken for about 15-20 minutes. You may also have a stress test. For this test, one of these things may be done: You will be asked to exercise on a treadmill or a stationary bike. You will be given medicines that will make your heart work harder. This is done if you are unable to exercise. When blood flow to your heart has peaked, a tracer will again be given through the IV tube. After 20-40 minutes, you will get back on the exam table. More pictures will be taken of your heart. Depending on the tracer that is used, more pictures may need to be taken 3-4 hours later. Your IV tube will be removed when the test is over. The test may vary among doctors and hospitals. What happens after the test? Ask your doctor: Whether you can return to your normal schedule, including diet, activities, and medicines. Whether you should drink more fluids. This will help to remove the tracer from your body. Drink enough fluid to keep your pee (urine) pale  yellow. Ask your doctor, or the department that is doing the test: When will my results be ready? How will I get my results? Summary A cardiac nuclear scan is a test that is done to check the flow of blood to your heart. Tell your doctor whether you are pregnant or may be pregnant. Before the test, ask your doctor about changing or stopping your normal medicines. This is important. Ask your doctor whether you can return to your normal activities. You may be asked to drink more fluids. This information is not intended to replace advice given to you by your health care provider. Make sure you discuss any questions you have with your health care provider. Document Revised: 01/28/2019 Document Reviewed: 03/24/2018 Elsevier Patient Education  [place].

## 2022-07-26 ENCOUNTER — Telehealth (HOSPITAL_COMMUNITY): Payer: Self-pay | Admitting: *Deleted

## 2022-07-26 NOTE — Telephone Encounter (Signed)
Left message on voicemail per DPR in reference to upcoming appointment scheduled on 07/31/2022 at 11:00 with detailed instructions given per Myocardial Perfusion Study Information Sheet for the test. LM to arrive 15 minutes early, and that it is imperative to arrive on time for appointment to keep from having the test rescheduled. If you need to cancel or reschedule your appointment, please call the office within 24 hours of your appointment. Failure to do so may result in a cancellation of your appointment, and a $50 no show fee. Phone number given for call back for any questions.

## 2022-07-31 ENCOUNTER — Ambulatory Visit: Payer: Medicare PPO

## 2022-07-31 DIAGNOSIS — K591 Functional diarrhea: Secondary | ICD-10-CM | POA: Diagnosis not present

## 2022-08-01 ENCOUNTER — Ambulatory Visit: Payer: Medicare PPO

## 2022-08-03 ENCOUNTER — Encounter: Payer: Self-pay | Admitting: Gastroenterology

## 2022-08-07 ENCOUNTER — Telehealth: Payer: Self-pay | Admitting: *Deleted

## 2022-08-07 NOTE — Telephone Encounter (Signed)
Left message on voicemail per DPR in reference to upcoming appointment scheduled on 08/08/22 at 1100 with detailed instructions given per Myocardial Perfusion Study Information Sheet for the test. LM to arrive 15 minutes early, and that it is imperative to arrive on time for appointment to keep from having the test rescheduled. If you need to cancel or reschedule your appointment, please call the office within 24 hours of your appointment. Failure to do so may result in a cancellation of your appointment, and a $50 no show fee. Phone number given for call back for any questions. Alba Perillo, Ranae Palms

## 2022-08-08 ENCOUNTER — Ambulatory Visit: Payer: Medicare PPO | Attending: Cardiology

## 2022-08-08 DIAGNOSIS — R55 Syncope and collapse: Secondary | ICD-10-CM

## 2022-08-08 DIAGNOSIS — R0609 Other forms of dyspnea: Secondary | ICD-10-CM

## 2022-08-08 MED ORDER — REGADENOSON 0.4 MG/5ML IV SOLN
0.4000 mg | Freq: Once | INTRAVENOUS | Status: AC
  Administered 2022-08-08: 0.4 mg via INTRAVENOUS

## 2022-08-08 MED ORDER — TECHNETIUM TC 99M TETROFOSMIN IV KIT
31.6000 | PACK | Freq: Once | INTRAVENOUS | Status: AC | PRN
  Administered 2022-08-08: 31.6 via INTRAVENOUS

## 2022-08-08 MED ORDER — TECHNETIUM TC 99M TETROFOSMIN IV KIT
10.6000 | PACK | Freq: Once | INTRAVENOUS | Status: AC | PRN
  Administered 2022-08-08: 10.6 via INTRAVENOUS

## 2022-08-09 ENCOUNTER — Ambulatory Visit: Payer: Medicare PPO

## 2022-08-10 LAB — MYOCARDIAL PERFUSION IMAGING
LV dias vol: 83 mL (ref 46–106)
LV sys vol: 33 mL
Nuc Stress EF: 60 %
Peak HR: 91 {beats}/min
Rest HR: 81 {beats}/min
Rest Nuclear Isotope Dose: 10.6 mCi
SDS: 8
SRS: 3
SSS: 11
ST Depression (mm): 0 mm
Stress Nuclear Isotope Dose: 31.6 mCi
TID: 1.3

## 2022-08-13 DIAGNOSIS — R002 Palpitations: Secondary | ICD-10-CM | POA: Diagnosis not present

## 2022-08-13 DIAGNOSIS — R55 Syncope and collapse: Secondary | ICD-10-CM | POA: Diagnosis not present

## 2022-10-02 ENCOUNTER — Ambulatory Visit: Payer: Medicare PPO | Admitting: Gastroenterology

## 2022-11-01 DIAGNOSIS — R404 Transient alteration of awareness: Secondary | ICD-10-CM | POA: Diagnosis not present

## 2022-11-01 DIAGNOSIS — R413 Other amnesia: Secondary | ICD-10-CM | POA: Diagnosis not present

## 2022-11-01 DIAGNOSIS — R42 Dizziness and giddiness: Secondary | ICD-10-CM | POA: Diagnosis not present

## 2022-11-01 DIAGNOSIS — R519 Headache, unspecified: Secondary | ICD-10-CM | POA: Diagnosis not present

## 2022-11-22 ENCOUNTER — Ambulatory Visit: Payer: Medicare PPO | Admitting: Gastroenterology

## 2022-11-22 ENCOUNTER — Encounter: Payer: Self-pay | Admitting: Gastroenterology

## 2022-11-22 VITALS — BP 120/70 | HR 91 | Ht 67.0 in | Wt 244.1 lb

## 2022-11-22 DIAGNOSIS — Z1211 Encounter for screening for malignant neoplasm of colon: Secondary | ICD-10-CM

## 2022-11-22 DIAGNOSIS — Z1212 Encounter for screening for malignant neoplasm of rectum: Secondary | ICD-10-CM

## 2022-11-22 MED ORDER — NA SULFATE-K SULFATE-MG SULF 17.5-3.13-1.6 GM/177ML PO SOLN: 1.0000 | Freq: Once | ORAL | 0 refills | Status: AC

## 2022-11-22 NOTE — Patient Instructions (Addendum)
_______________________________________________________  If your blood pressure at your visit was 140/90 or greater, please contact your primary care physician to follow up on this.  _______________________________________________________  If you are age 66 or older, your body mass index should be between 23-30. Your Body mass index is 38.24 kg/m. If this is out of the aforementioned range listed, please consider follow up with your Primary Care Provider.  If you are age 57 or younger, your body mass index should be between 19-25. Your Body mass index is 38.24 kg/m. If this is out of the aformentioned range listed, please consider follow up with your Primary Care Provider.   ________________________________________________________  The [facility] providers would like to encourage you to use [facility] to communicate with providers for non-urgent requests or questions.  Due to long hold times on the telephone, sending your provider a message by [facility] may be a faster and more efficient way to get a response.  Please allow 48 business hours for a response.  Please remember that this is for non-urgent requests.  _______________________________________________________  Dennis Bast have been scheduled for a colonoscopy. Please follow written instructions given to you at your visit today.  Please pick up your prep supplies at the pharmacy within the next 1-3 days. If you use inhalers (even only as needed), please bring them with you on the day of your procedure.  We have sent the following medications to your pharmacy for you to pick up at your convenience: Suprep  Please call with any questions or concerns.  Thank you,  Dr. Jackquline Denmark

## 2022-11-22 NOTE — Progress Notes (Signed)
Chief Complaint: For colon  Referring Provider:  Lowella Dandy, NP      ASSESSMENT AND PLAN;   #1.  CRC screening   Plan: -Colon   Discussed risks & benefits of colonoscopy. Risks including rare perforation req laparotomy, bleeding after bx/polypectomy req blood transfusion, rarely missing neoplasms, risks of anesthesia/sedation, rare risk of damage to internal organs. Benefits outweigh the risks. Patient agrees to proceed. All the questions were answered. Pt consents to proceed.  HPI:    [name] is a 67 y.o. female  With multiple medical problems as below, Nl EF 60-65% (2DE 02/2019), H/O fatty liver  (Nl LFTs with nl alb/plt 06/2022), H/O vasovagal syncope (neg cardiac WU 07/2022)  For CRC screening  No nausea, vomiting, heartburn(on nexium QD, has it if she misses a single dose), regurgitation, odynophagia or dysphagia.  No significant diarrhea or constipation.  No melena or hematochezia. No unintentional weight loss. No abdominal pain but occ cramping.     Past GI workup: Last colonoscopy 02/2011 ([facility]) with mild melanosis coli, mild diverticulosis, small internal hemorrhoids and healed anal fissure.   Past Medical History:  Diagnosis Date   Abnormal glucose    Achilles tendinitis of left lower extremity    Acute suppurative arthritis due to bacteria ([place]) 03/09/2011   Overview:  Last Assessment & Plan:  Methicillin sensitive staphylococcal aureus septic hip. She clearly does need I&D of the hip. I will have her continue the doxycycline for now but stop it 7 days prior to her surgery to maximize the yield on the cultures in the operating room though I be shocked if we don't find methicillin sensitive staph aureus again.. I agree with removing as much of the pros   Anal fissure 11/30/2015   Arthritis    right hip   Atypical chest pain 04/09/2016   Bilateral edema of lower extremity    BMI 39.0-39.9,adult    Cancer [facility])    SKIN CANCER   Carpal tunnel syndrome  03/09/2011   Overview:  Last Assessment & Plan:  Clinically this seems to be carpal tunnel syndrome. Giving given her history of infection though we can keep in the back of our mind the idea that she will have a cervical spine problem but I think this is unlikely at this point in time medics carpal tunnel syndrome fits clinically this was a positive Tinel's sign however in for a brace for her.   Chronic vaginitis    Chronic venous insufficiency    Depression    Dyslipidemia    Fatigue 10/13/2015   Fibromyalgia    GERD without esophagitis    History of immune thrombocytopenia 03/09/2011   History of operative procedure on hip 03/10/2012   History of TTP (thrombotic thrombocytopenic purpura)    Hypokalemia    Lichen sclerosus et atrophicus of the vulva 11/30/2015   Major depressive disorder, recurrent, moderate (HCC)    Methicillin susceptible Staphylococcus aureus infection 03/09/2011   Overview:  Last Assessment & Plan:  This is undoubtedlythe culprit organism   Morbid obesity with BMI of 45.0-49.9, adult ([place]) 10/13/2015   Morbidly obese ([place])    MSSA (methicillin susceptible Staphylococcus aureus) infection    Near syncope    Osteoarthritis, chronic    Pain due to total hip replacement ([place]) 09/20/2011   Palpitations 03/04/2019   Postmenopausal    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 Mild diverticulosis. Small internal hemorrhoids. Healed anal fissure   ENDOVENOUS ABLATION SAPHENOUS VEIN W/ LASER Right 04/02/2019   endovenous laser ablation right greater saphenous vein by [name] MD    JOINT REPLACEMENT  08/2009   right hip replacement   REVISION TOTAL HIP ARTHROPLASTY      TONSILLECTOMY  1963   TOTAL HIP REVISION  10/08/2011   Procedure: TOTAL HIP REVISION;  Surgeon: Mauri Pole;  Location: [facility];  Service: Orthopedics;  Laterality: Right;  Resection of Right Total Hip/Extended Trochanteric Osteotomy/Placement of Antibiotic Total Hip Cemented by Depuy   TOTAL HIP REVISION  03/10/2012   Procedure: TOTAL HIP REVISION;  Surgeon: Mauri Pole, MD;  Location: [facility];  Service: Orthopedics;  Laterality: Right;  Reimplantation/Revision of a Right Total Hip and Removal of Cemented Implant    Family History  Problem Relation Age of Onset   Hypertension Mother    Hypertension Father    Alzheimer's disease Father    Prostate cancer Father    Hypertension Maternal Grandfather    Seizures Other     Social History   Tobacco Use   Smoking status: Former    Packs/day: 1.50    Years: 20.00    Total pack years: 30.00    Types: Cigarettes    Quit date: 10/04/1990    Years since quitting: 32.1   Smokeless tobacco: Former    Quit date: 03/09/1991  Vaping Use   Vaping Use: Never used  Substance Use Topics   Alcohol use: Yes    Comment: OCCASIONAL   Drug use: No    Current Outpatient Medications  Medication Sig Dispense Refill   ascorbic acid (VITAMIN C) 1000 MG tablet Take 1,000 mg by mouth daily.     buPROPion (WELLBUTRIN XL) 150 MG 24 hr tablet Take 150 mg by mouth every evening.     DULoxetine (CYMBALTA) 60 MG capsule Take 60 mg by mouth at bedtime.      esomeprazole (NEXIUM) 40 MG capsule Take 40 mg by mouth daily.     ibuprofen (ADVIL,MOTRIN) 800 MG tablet Take 800 mg by mouth as needed for headache or mild pain.     Vitamin D, Ergocalciferol, (DRISDOL) 1.25 MG (50000 UNIT) CAPS capsule Take 50,000 Units by mouth once a week.     GEMTESA 75 MG TABS Take 1 tablet by mouth daily. (Patient not taking: Reported on 11/22/2022)     Current Facility-Administered Medications  Medication Dose Route Frequency Provider Last Rate Last Admin   triamcinolone  acetonide (KENALOG-40) injection 20 mg  20 mg Other Once Landis Martins, DPM        No Known Allergies  Review of Systems:  Constitutional: Denies fever, chills, diaphoresis, appetite change and fatigue.  HEENT: Denies photophobia, eye pain, redness, hearing loss, ear pain, congestion, sore throat, rhinorrhea, sneezing, mouth sores, neck pain, neck stiffness and tinnitus.   Respiratory: Denies SOB, DOE, cough, chest tightness,  and wheezing.   Cardiovascular: Denies chest pain, palpitations and leg swelling.  Genitourinary: Denies dysuria, urgency, frequency, hematuria, flank pain and difficulty urinating.  Musculoskeletal: Denies myalgias, has back pain, joint swelling, arthralgias and gait problem.  Skin: No rash.  Neurological: Denies dizziness, seizures, syncope, weakness, light-headedness, numbness and headaches.  Hematological: Denies adenopathy. Easy bruising, personal or family bleeding history  Psychiatric/Behavioral: Has anxiety or depression     Physical Exam:    BP 120/70   Pulse 91   Ht '5\' 7"'$  (1.702 m)   Wt 244 lb 2 oz (110.7 kg)   LMP 04/05/2011   BMI 38.24 kg/m  Wt Readings from Last 3 Encounters:  11/22/22 244 lb 2 oz (110.7 kg)  08/08/22 228 lb (103.4 kg)  07/25/22 228 lb 9.6 oz (103.7 kg)   Constitutional:  Well-developed, in no acute distress. Psychiatric: Normal mood and affect. Behavior is normal. HEENT: Pupils normal.  Conjunctivae are normal. No scleral icterus. Cardiovascular: Normal rate, regular rhythm. No edema Pulmonary/chest: Effort normal and breath sounds normal. No wheezing, rales or rhonchi. Abdominal: Soft, nondistended. Nontender. Bowel sounds active throughout. There are no masses palpable. No hepatomegaly. Rectal: Deferred Neurological: Alert and oriented to person place and time. Skin: Skin is warm and dry. No rashes noted.    Carmell Austria, MD 11/22/2022, 11:27 AM  Cc: Lowella Dandy, NP

## 2022-11-29 DIAGNOSIS — D367 Benign neoplasm of other specified sites: Secondary | ICD-10-CM | POA: Diagnosis not present

## 2022-11-29 DIAGNOSIS — R109 Unspecified abdominal pain: Secondary | ICD-10-CM | POA: Diagnosis not present

## 2022-11-29 DIAGNOSIS — F331 Major depressive disorder, recurrent, moderate: Secondary | ICD-10-CM | POA: Diagnosis not present

## 2022-11-30 DIAGNOSIS — F4321 Adjustment disorder with depressed mood: Secondary | ICD-10-CM | POA: Diagnosis not present

## 2022-12-06 DIAGNOSIS — R2242 Localized swelling, mass and lump, left lower limb: Secondary | ICD-10-CM | POA: Diagnosis not present

## 2022-12-18 DIAGNOSIS — F4321 Adjustment disorder with depressed mood: Secondary | ICD-10-CM | POA: Diagnosis not present

## 2022-12-24 DIAGNOSIS — F4321 Adjustment disorder with depressed mood: Secondary | ICD-10-CM | POA: Diagnosis not present

## 2023-01-02 DIAGNOSIS — F4321 Adjustment disorder with depressed mood: Secondary | ICD-10-CM | POA: Diagnosis not present

## 2023-01-03 DIAGNOSIS — I872 Venous insufficiency (chronic) (peripheral): Secondary | ICD-10-CM | POA: Diagnosis not present

## 2023-01-03 DIAGNOSIS — E559 Vitamin D deficiency, unspecified: Secondary | ICD-10-CM | POA: Diagnosis not present

## 2023-01-03 DIAGNOSIS — K219 Gastro-esophageal reflux disease without esophagitis: Secondary | ICD-10-CM | POA: Diagnosis not present

## 2023-01-03 DIAGNOSIS — Z8619 Personal history of other infectious and parasitic diseases: Secondary | ICD-10-CM | POA: Diagnosis not present

## 2023-01-03 DIAGNOSIS — L720 Epidermal cyst: Secondary | ICD-10-CM | POA: Diagnosis not present

## 2023-01-03 DIAGNOSIS — Z862 Personal history of diseases of the blood and blood-forming organs and certain disorders involving the immune mechanism: Secondary | ICD-10-CM | POA: Diagnosis not present

## 2023-01-03 DIAGNOSIS — R2242 Localized swelling, mass and lump, left lower limb: Secondary | ICD-10-CM | POA: Diagnosis not present

## 2023-01-07 DIAGNOSIS — F4321 Adjustment disorder with depressed mood: Secondary | ICD-10-CM | POA: Diagnosis not present

## 2023-01-10 ENCOUNTER — Ambulatory Visit (AMBULATORY_SURGERY_CENTER): Payer: Medicare PPO | Admitting: Gastroenterology

## 2023-01-10 ENCOUNTER — Encounter: Payer: Self-pay | Admitting: Gastroenterology

## 2023-01-10 VITALS — BP 122/76 | HR 75 | Temp 98.4°F | Resp 17 | Ht 67.0 in | Wt 244.0 lb

## 2023-01-10 DIAGNOSIS — K633 Ulcer of intestine: Secondary | ICD-10-CM

## 2023-01-10 DIAGNOSIS — F32A Depression, unspecified: Secondary | ICD-10-CM | POA: Diagnosis not present

## 2023-01-10 DIAGNOSIS — D122 Benign neoplasm of ascending colon: Secondary | ICD-10-CM

## 2023-01-10 DIAGNOSIS — M797 Fibromyalgia: Secondary | ICD-10-CM | POA: Diagnosis not present

## 2023-01-10 DIAGNOSIS — Z1211 Encounter for screening for malignant neoplasm of colon: Secondary | ICD-10-CM

## 2023-01-10 DIAGNOSIS — C184 Malignant neoplasm of transverse colon: Secondary | ICD-10-CM | POA: Diagnosis not present

## 2023-01-10 MED ORDER — SODIUM CHLORIDE 0.9 % IV SOLN: 500.0000 mL | Freq: Once | INTRAVENOUS | Status: DC

## 2023-01-10 NOTE — Patient Instructions (Addendum)
Recommendation: Patient has a contact number available for                            emergencies. The signs and symptoms of potential                            delayed complications were discussed with the                            patient. Return to normal activities tomorrow.                            Written discharge instructions were provided to the                            patient.                           - Resume previous diet.                           - Continue present medications.                           - Await pathology results.                           - The findings and recommendations were discussed                            with the patient's family.  Handouts on hemorrhoids, polyps and diverticulosis given.   YOU HAD AN ENDOSCOPIC PROCEDURE TODAY AT [facility]:   Refer to the procedure report that was given to you for any specific questions about what was found during the examination.  If the procedure report does not answer your questions, please call your gastroenterologist to clarify.  If you requested that your care partner not be given the details of your procedure findings, then the procedure report has been included in a sealed envelope for you to review at your convenience later.  YOU SHOULD EXPECT: Some feelings of bloating in the abdomen. Passage of more gas than usual.  Walking can help get rid of the air that was put into your GI tract during the procedure and reduce the bloating. If you had a lower endoscopy (such as a colonoscopy or flexible sigmoidoscopy) you may notice spotting of blood in your stool or on the toilet paper. If you underwent a bowel prep for your procedure, you may not have a normal bowel movement for a few days.  Please Note:  You might notice some irritation and congestion in your nose or some drainage.  This is from the oxygen used during your procedure.  There is no need for concern and it should clear up in a day  or so.  SYMPTOMS TO REPORT IMMEDIATELY:  Following lower endoscopy (colonoscopy or flexible sigmoidoscopy):  Excessive amounts of blood in the stool  Significant tenderness or worsening of abdominal pains  Swelling of the abdomen that is new, acute  Fever of 100F or higher  For urgent  or emergent issues, a gastroenterologist can be reached at any hour by calling [phone]. Do not use MyChart messaging for urgent concerns.    DIET:  We do recommend a small meal at first, but then you may proceed to your regular diet.  Drink plenty of fluids but you should avoid alcoholic beverages for 24 hours.  ACTIVITY:  You should plan to take it easy for the rest of today and you should NOT DRIVE or use heavy machinery until tomorrow (because of the sedation medicines used during the test).    FOLLOW UP: Our staff will call the number listed on your records the next business day following your procedure.  We will call around 7:15- 8:00 am to check on you and address any questions or concerns that you may have regarding the information given to you following your procedure. If we do not reach you, we will leave a message.     If any biopsies were taken you will be contacted by phone or by letter within the next 1-3 weeks.  Please call us at [phone] if you have not heard about the biopsies in 3 weeks.    SIGNATURES/CONFIDENTIALITY: You and/or your care partner have signed paperwork which will be entered into your electronic medical record.  These signatures attest to the fact that that the information above on your After Visit Summary has been reviewed and is understood.  Full responsibility of the confidentiality of this discharge information lies with you and/or your care-partner.

## 2023-01-10 NOTE — Progress Notes (Signed)
Vss nad trans to pacu 

## 2023-01-10 NOTE — Progress Notes (Signed)
Called to room to assist during endoscopic procedure.  Patient ID and intended procedure confirmed with present staff. Received instructions for my participation in the procedure from the performing physician.  

## 2023-01-10 NOTE — Progress Notes (Signed)
Pt's states no medical or surgical changes since previsit or office visit. 

## 2023-01-10 NOTE — Progress Notes (Signed)
Chief Complaint: For colon  Referring Provider:  Lowella Dandy, NP      ASSESSMENT AND PLAN;   #1.  CRC screening   Plan: -Colon   Discussed risks & benefits of colonoscopy. Risks including rare perforation req laparotomy, bleeding after bx/polypectomy req blood transfusion, rarely missing neoplasms, risks of anesthesia/sedation, rare risk of damage to internal organs. Benefits outweigh the risks. Patient agrees to proceed. All the questions were answered. Pt consents to proceed.  HPI:    [name] is a 67 y.o. female  With multiple medical problems as below, Nl EF 60-65% (2DE 02/2019), H/O fatty liver  (Nl LFTs with nl alb/plt 06/2022), H/O vasovagal syncope (neg cardiac WU 07/2022)  For CRC screening  No nausea, vomiting, heartburn(on nexium QD, has it if she misses a single dose), regurgitation, odynophagia or dysphagia.  No significant diarrhea or constipation.  No melena or hematochezia. No unintentional weight loss. No abdominal pain but occ cramping.     Past GI workup: Last colonoscopy 02/2011 ([facility]) with mild melanosis coli, mild diverticulosis, small internal hemorrhoids and healed anal fissure.   Past Medical History:  Diagnosis Date   Abnormal glucose    Achilles tendinitis of left lower extremity    Acute suppurative arthritis due to bacteria ([place]) 03/09/2011   Overview:  Last Assessment & Plan:  Methicillin sensitive staphylococcal aureus septic hip. She clearly does need I&D of the hip. I will have her continue the doxycycline for now but stop it 7 days prior to her surgery to maximize the yield on the cultures in the operating room though I be shocked if we don't find methicillin sensitive staph aureus again.. I agree with removing as much of the pros   Anal fissure 11/30/2015   Arthritis    right hip   Atypical chest pain 04/09/2016   Bilateral edema of lower extremity    BMI 39.0-39.9,adult    Cancer [facility])    SKIN CANCER   Carpal tunnel syndrome  03/09/2011   Overview:  Last Assessment & Plan:  Clinically this seems to be carpal tunnel syndrome. Giving given her history of infection though we can keep in the back of our mind the idea that she will have a cervical spine problem but I think this is unlikely at this point in time medics carpal tunnel syndrome fits clinically this was a positive Tinel's sign however in for a brace for her.   Chronic vaginitis    Chronic venous insufficiency    Depression    Dyslipidemia    Fatigue 10/13/2015   Fibromyalgia    GERD without esophagitis    History of immune thrombocytopenia 03/09/2011   History of operative procedure on hip 03/10/2012   History of TTP (thrombotic thrombocytopenic purpura)    Hypokalemia    Lichen sclerosus et atrophicus of the vulva 11/30/2015   Major depressive disorder, recurrent, moderate (HCC)    Methicillin susceptible Staphylococcus aureus infection 03/09/2011   Overview:  Last Assessment & Plan:  This is undoubtedlythe culprit organism   Morbid obesity with BMI of 45.0-49.9, adult ([place]) 10/13/2015   Morbidly obese ([place])    MSSA (methicillin susceptible Staphylococcus aureus) infection    Near syncope    Osteoarthritis, chronic    Pain due to total hip replacement ([place]) 09/20/2011   Palpitations 03/04/2019   Postmenopausal    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 Mild diverticulosis. Small internal hemorrhoids. Healed anal fissure   ENDOVENOUS ABLATION SAPHENOUS VEIN W/ LASER Right 04/02/2019   endovenous laser ablation right greater saphenous vein by [name] MD    JOINT REPLACEMENT  08/2009   right hip replacement   REVISION TOTAL HIP ARTHROPLASTY      TONSILLECTOMY  1963   TOTAL HIP REVISION  10/08/2011   Procedure: TOTAL HIP REVISION;  Surgeon: Mauri Pole;  Location: [facility];  Service: Orthopedics;  Laterality: Right;  Resection of Right Total Hip/Extended Trochanteric Osteotomy/Placement of Antibiotic Total Hip Cemented by Depuy   TOTAL HIP REVISION  03/10/2012   Procedure: TOTAL HIP REVISION;  Surgeon: Mauri Pole, MD;  Location: [facility];  Service: Orthopedics;  Laterality: Right;  Reimplantation/Revision of a Right Total Hip and Removal of Cemented Implant    Family History  Problem Relation Age of Onset   Hypertension Mother    Hypertension Father    Alzheimer's disease Father    Prostate cancer Father    Hypertension Maternal Grandfather    Seizures Other    Colon cancer Neg Hx    Stomach cancer Neg Hx    Rectal cancer Neg Hx    Esophageal cancer Neg Hx     Social History   Tobacco Use   Smoking status: Former    Packs/day: 1.50    Years: 20.00    Additional pack years: 0.00    Total pack years: 30.00    Types: Cigarettes    Quit date: 10/04/1990    Years since quitting: 32.2   Smokeless tobacco: Former    Quit date: 03/09/1991  Vaping Use   Vaping Use: Never used  Substance Use Topics   Alcohol use: Yes    Comment: OCCASIONAL   Drug use: No    Current Outpatient Medications  Medication Sig Dispense Refill   buPROPion (WELLBUTRIN XL) 150 MG 24 hr tablet Take 150 mg by mouth every evening.     DULoxetine (CYMBALTA) 60 MG capsule Take 60 mg by mouth at bedtime.      esomeprazole (NEXIUM) 40 MG capsule Take 40 mg by mouth daily.     ascorbic acid (VITAMIN C) 1000 MG tablet Take 1,000 mg by mouth daily. (Patient not taking: Reported on 01/10/2023)     GEMTESA 75 MG TABS Take 1 tablet by mouth daily. (Patient not taking: Reported on 11/22/2022)     ibuprofen (ADVIL,MOTRIN) 800 MG tablet Take 800 mg by mouth as needed for headache or mild pain.     Vitamin D, Ergocalciferol, (DRISDOL) 1.25 MG (50000 UNIT) CAPS  capsule Take 50,000 Units by mouth once a week. (Patient not taking: Reported on 01/10/2023)     Current Facility-Administered Medications  Medication Dose Route Frequency Provider Last Rate Last Admin   0.9 %  sodium chloride infusion  500 mL Intravenous Once Jackquline Denmark, MD       triamcinolone acetonide (KENALOG-40) injection 20 mg  20 mg Other Once Landis Martins, DPM        No Known Allergies  Review of Systems:  Constitutional: Denies fever, chills, diaphoresis, appetite change and fatigue.  HEENT: Denies photophobia, eye pain, redness, hearing loss, ear pain, congestion, sore throat, rhinorrhea, sneezing, mouth sores, neck  pain, neck stiffness and tinnitus.   Respiratory: Denies SOB, DOE, cough, chest tightness,  and wheezing.   Cardiovascular: Denies chest pain, palpitations and leg swelling.  Genitourinary: Denies dysuria, urgency, frequency, hematuria, flank pain and difficulty urinating.  Musculoskeletal: Denies myalgias, has back pain, joint swelling, arthralgias and gait problem.  Skin: No rash.  Neurological: Denies dizziness, seizures, syncope, weakness, light-headedness, numbness and headaches.  Hematological: Denies adenopathy. Easy bruising, personal or family bleeding history  Psychiatric/Behavioral: Has anxiety or depression     Physical Exam:    BP 123/82   Pulse 92   Temp 98.4 F (36.9 C)   Ht 5\' 7"  (1.702 m)   Wt 244 lb (110.7 kg)   LMP 04/05/2011   SpO2 95%   BMI 38.22 kg/m  Wt Readings from Last 3 Encounters:  01/10/23 244 lb (110.7 kg)  11/22/22 244 lb 2 oz (110.7 kg)  08/08/22 228 lb (103.4 kg)   Constitutional:  Well-developed, in no acute distress. Psychiatric: Normal mood and affect. Behavior is normal. HEENT: Pupils normal.  Conjunctivae are normal. No scleral icterus. Cardiovascular: Normal rate, regular rhythm. No edema Pulmonary/chest: Effort normal and breath sounds normal. No wheezing, rales or rhonchi. Abdominal: Soft, nondistended.  Nontender. Bowel sounds active throughout. There are no masses palpable. No hepatomegaly. Rectal: Deferred Neurological: Alert and oriented to person place and time. Skin: Skin is warm and dry. No rashes noted.    Carmell Austria, MD 01/10/2023, 2:12 PM  Cc: Lowella Dandy, NP

## 2023-01-10 NOTE — Op Note (Signed)
[place] Patient Name: [name] Procedure Date: 01/10/2023 2:14 PM MRN: [id number] Endoscopist: Jackquline Denmark , MD, [id number] Age: 67 Referring MD:  Date of Birth: [birth year] Gender: Female Account #: [id number] Procedure:                Colonoscopy Indications:              Screening for colorectal malignant neoplasm Medicines:                Monitored Anesthesia Care Procedure:                Pre-Anesthesia Assessment:                           - Prior to the procedure, a History and Physical                            was performed, and patient medications and                            allergies were reviewed. The patient's tolerance of                            previous anesthesia was also reviewed. The risks                            and benefits of the procedure and the sedation                            options and risks were discussed with the patient.                            All questions were answered, and informed consent                            was obtained. Prior Anticoagulants: The patient has                            taken no anticoagulant or antiplatelet agents. ASA                            Grade Assessment: II - A patient with mild systemic                            disease. After reviewing the risks and benefits,                            the patient was deemed in satisfactory condition to                            undergo the procedure.                           After obtaining informed consent, the colonoscope  was passed under direct vision. Throughout the                            procedure, the patient's blood pressure, pulse, and                            oxygen saturations were monitored continuously. The                            [id number]-HQ190L Colonoscope [id number] was introduced                            through the anus and advanced to the the cecum,                            identified by  appendiceal orifice and ileocecal                            valve. The colonoscopy was performed without                            difficulty. The patient tolerated the procedure                            well. The quality of the bowel preparation was                            good. The ileocecal valve, appendiceal orifice, and                            rectum were photographed. Scope In: 2:18:23 PM Scope Out: 2:43:03 PM Scope Withdrawal Time: 0 hours 18 minutes 23 seconds  Total Procedure Duration: 0 hours 24 minutes 40 seconds  Findings:                 An ulcerated 2 cm x 2 cm non-obstructing small mass                            with heaped up margins was found in the distal                            transverse colon. The mass was partially                            circumferential (involving less than one-third of                            the lumen circumference). No bleeding was present.                            This was biopsied with a cold forceps for                            histology. The mass was somewhat difficult  to                            biopsy. Area was tattooed with an injection of 2 mL                            of Spot (carbon black) opposite the mass.                           A 10 mm polyp was found in the proximal ascending                            colon. The polyp was sessile. The polyp was removed                            with a hot snare. The polyp was removed with a cold                            snare. Resection and retrieval were complete.                           A few small-mouthed diverticula were found in the                            sigmoid colon.                           Non-bleeding internal hemorrhoids were found during                            retroflexion. The hemorrhoids were small and Grade                            I (internal hemorrhoids that do not prolapse).                           The exam was otherwise without  abnormality on                            direct and retroflexion views. Complications:            No immediate complications. Estimated Blood Loss:     Estimated blood loss: none. Impression:               - Rule out malignancy, tumor in the distal                            transverse colon. Biopsied. Tattooed.                           - One 10 mm polyp in the proximal ascending colon,                            removed with a hot snare and removed with a cold  snare. Resected and retrieved.                           - Mild sigmoid diverticulosis.                           - Non-bleeding internal hemorrhoids.                           - The examination was otherwise normal on direct                            and retroflexion views. Recommendation:           - Patient has a contact number available for                            emergencies. The signs and symptoms of potential                            delayed complications were discussed with the                            patient. Return to normal activities tomorrow.                            Written discharge instructions were provided to the                            patient.                           - Resume previous diet.                           - Continue present medications.                           - Await pathology results.                           - The findings and recommendations were discussed                            with the patient's family. Jackquline Denmark, MD 01/10/2023 2:50:58 PM This report has been signed electronically.

## 2023-01-11 ENCOUNTER — Telehealth: Payer: Self-pay | Admitting: Gastroenterology

## 2023-01-11 ENCOUNTER — Telehealth: Payer: Self-pay

## 2023-01-11 DIAGNOSIS — C189 Malignant neoplasm of colon, unspecified: Secondary | ICD-10-CM

## 2023-01-11 NOTE — Telephone Encounter (Signed)
Patient would like to see Dr. Forestine Na in [place] as a Psychologist, sport and exercise. Has seen them in the past  Please make appointment with Dr. Forestine Na  RG

## 2023-01-11 NOTE — Telephone Encounter (Signed)
No answer, left message to call if having any issues or concerns, B.Dandy Lazaro RN 

## 2023-01-11 NOTE — Telephone Encounter (Signed)
Inbound call from Dr. Alric Seton from [facility], is wishing to speak with you in regards to this patient. Stated a good call back phone number would be [phone].    Thank you.

## 2023-01-11 NOTE — Telephone Encounter (Signed)
Pathology called- Bx-adenocarcinoma, distal transverse mass Discussed in detail with patient  Plan: -CBC, CMP, CEA -CT chest/Abdo/pelvis with contrast -Oncology consultation with Dr. Peggye Fothergill. Hinton Rao -She will let us know when she decides about the surgeon  RG

## 2023-01-11 NOTE — Telephone Encounter (Signed)
LVM for patient to call back to see about her lab work and CT scan

## 2023-01-14 ENCOUNTER — Other Ambulatory Visit: Payer: Self-pay

## 2023-01-14 DIAGNOSIS — F4321 Adjustment disorder with depressed mood: Secondary | ICD-10-CM | POA: Diagnosis not present

## 2023-01-14 DIAGNOSIS — C189 Malignant neoplasm of colon, unspecified: Secondary | ICD-10-CM

## 2023-01-14 NOTE — Telephone Encounter (Signed)
Referral sent to Dr. Amalia Hailey office along with pt records.  Pt made aware. Documented in result notes.

## 2023-01-15 ENCOUNTER — Inpatient Hospital Stay: Payer: Medicare PPO

## 2023-01-15 ENCOUNTER — Encounter: Payer: Self-pay | Admitting: Oncology

## 2023-01-15 ENCOUNTER — Inpatient Hospital Stay: Payer: Medicare PPO | Attending: Oncology | Admitting: Oncology

## 2023-01-15 ENCOUNTER — Telehealth: Payer: Self-pay

## 2023-01-15 VITALS — BP 129/72 | HR 88 | Temp 97.9°F | Resp 18 | Ht 65.9 in | Wt 249.7 lb

## 2023-01-15 DIAGNOSIS — Z8542 Personal history of malignant neoplasm of other parts of uterus: Secondary | ICD-10-CM | POA: Diagnosis not present

## 2023-01-15 DIAGNOSIS — R55 Syncope and collapse: Secondary | ICD-10-CM

## 2023-01-15 DIAGNOSIS — Z79899 Other long term (current) drug therapy: Secondary | ICD-10-CM | POA: Insufficient documentation

## 2023-01-15 DIAGNOSIS — Z87891 Personal history of nicotine dependence: Secondary | ICD-10-CM | POA: Insufficient documentation

## 2023-01-15 DIAGNOSIS — C189 Malignant neoplasm of colon, unspecified: Secondary | ICD-10-CM | POA: Insufficient documentation

## 2023-01-15 DIAGNOSIS — C184 Malignant neoplasm of transverse colon: Secondary | ICD-10-CM | POA: Insufficient documentation

## 2023-01-15 DIAGNOSIS — R197 Diarrhea, unspecified: Secondary | ICD-10-CM | POA: Insufficient documentation

## 2023-01-15 DIAGNOSIS — R42 Dizziness and giddiness: Secondary | ICD-10-CM

## 2023-01-15 LAB — COMPREHENSIVE METABOLIC PANEL
ALT: 19 U/L (ref 0–44)
AST: 20 U/L (ref 15–41)
Albumin: 4.3 g/dL (ref 3.5–5.0)
Alkaline Phosphatase: 98 U/L (ref 38–126)
Anion gap: 8 (ref 5–15)
BUN: 13 mg/dL (ref 8–23)
CO2: 28 mmol/L (ref 22–32)
Calcium: 9.2 mg/dL (ref 8.9–10.3)
Chloride: 106 mmol/L (ref 98–111)
Creatinine, Ser: 0.65 mg/dL (ref 0.44–1.00)
GFR, Estimated: >60 mL/min (ref >60)
Glucose, Bld: 88 mg/dL (ref 70–99)
Potassium: 4 mmol/L (ref 3.5–5.1)
Sodium: 142 mmol/L (ref 135–145)
Total Bilirubin: 0.6 mg/dL (ref 0.3–1.2)
Total Protein: 7 g/dL (ref 6.5–8.1)

## 2023-01-15 LAB — CBC WITH DIFFERENTIAL/PLATELET
Abs Immature Granulocytes: 0.01 10*3/uL (ref 0.00–0.07)
Basophils Absolute: 0 10*3/uL (ref 0.0–0.1)
Basophils Relative: 1 %
Eosinophils Absolute: 0.1 10*3/uL (ref 0.0–0.5)
Eosinophils Relative: 2 %
HCT: 38.2 % (ref 36.0–46.0)
Hemoglobin: 12.5 g/dL (ref 12.0–15.0)
Immature Granulocytes: 0 %
Lymphocytes Relative: 25 %
Lymphs Abs: 1.6 10*3/uL (ref 0.7–4.0)
MCH: 28.8 pg (ref 26.0–34.0)
MCHC: 32.7 g/dL (ref 30.0–36.0)
MCV: 88 fL (ref 80.0–100.0)
Monocytes Absolute: 0.6 10*3/uL (ref 0.1–1.0)
Monocytes Relative: 10 %
Neutro Abs: 4.1 10*3/uL (ref 1.7–7.7)
Neutrophils Relative %: 62 %
Platelets: 178 10*3/uL (ref 150–400)
RBC: 4.34 MIL/uL (ref 3.87–5.11)
RDW: 13.3 % (ref 11.5–15.5)
WBC: 6.4 10*3/uL (ref 4.0–10.5)
nRBC: 0 % (ref 0.0–0.2)

## 2023-01-15 NOTE — Progress Notes (Signed)
[facility] Initial Visit:  Patient Care Team: Lowella Dandy, NP as PCP - General (Internal Medicine)  CHIEF COMPLAINTS/PURPOSE OF CONSULTATION:  Oncology History  Colon cancer  01/15/2023 Initial Diagnosis   Colon cancer ([place])     HISTORY OF PRESENTING ILLNESS: [name] 67 y.o. female is here because of  colon cancer Medical history notable for septic arthritis of the hip, obesity, atypical chest pain, skin cancer, carpal tunnel syndrome, chronic vaginitis, chronic venous and sufficiency, TTP, syncope  January 10, 2023: Colonoscopy-ulcerated 2 cm nonobstructing small mass with heaped up margins found in distal transverse colon.  Mass was partially circumferential (involves less than one third of the lumen circumference) no bleeding present.  10 mm polyp in proximal ascending colon which was sessile.  Pathology-Ascending colon polyp was compatible with a sessile serrated adenoma without cytologic dysplasia Transverse colon polyp-invasive moderately differentiated adenocarcinoma arising within a tubular adenoma with high-grade dysplasia  January 15 2023: [facility] Consult Patient reports that the colonoscopy was performed for surveillance; however, she was also having episodes of intermittent diarrhea x 2 to 3 months.  Stools were not bloody, nor was she passing mucus but they were soft.  In association with these episodes she would also feel dizzy and note an odd smell, nausea.   To see Dr. Amalia Hailey from surgery on January 22 2023   Social:  Married.  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  Tobacco quit 24 years.  Rare glass of wine  [facility] Mother alive 61 epileptic, dementia Father died 4 Alzheimers, prostate cancer Brother alive 80 arthritis requiring joint replacement,   Review of Systems  Constitutional:  Negative for appetite change, chills, fatigue, fever and unexpected weight change.       Has gained weight due to change in diet  HENT:    Negative for mouth sores, nosebleeds, sore throat, tinnitus and trouble swallowing.   Eyes:  Negative for eye problems and icterus.       Vision changes:  None  Respiratory:  Negative for chest tightness, hemoptysis, shortness of breath and wheezing.        One month of nonproductive cough.  No history of seasonable allergies  Cardiovascular:  Negative for chest pain and leg swelling.       Intermittent palpitations.  Chronic LE edema  Endocrine: Negative for hot flashes.       Cold intolerance:  none Heat intolerance:  none  Genitourinary:  Negative for bladder incontinence, difficulty urinating, dysuria, frequency, hematuria and nocturia.   Musculoskeletal:  Negative for arthralgias, back pain, gait problem, myalgias, neck pain and neck stiffness.  Skin:  Negative for itching, rash and wound.  Neurological:  Negative for dizziness, extremity weakness, gait problem, headaches, numbness, seizures and speech difficulty.  Hematological:  Negative for adenopathy. Bruises/bleeds easily.  Psychiatric/Behavioral:  Negative for sleep disturbance and suicidal ideas. The patient is not nervous/anxious.     MEDICAL HISTORY: Past Medical History:  Diagnosis Date   Abnormal glucose    Achilles tendinitis of left lower extremity    Acute suppurative arthritis due to bacteria 03/09/2011   Overview:  Last Assessment & Plan:  Methicillin sensitive staphylococcal aureus septic hip. She clearly does need I&D of the hip. I will have her continue the doxycycline for now but stop it 7 days prior to her surgery to maximize the yield on the cultures in the operating room though I be shocked if we don't find methicillin sensitive staph aureus  again.. I agree with removing as much of the pros   Anal fissure 11/30/2015   Arthritis    right hip   Atypical chest pain 04/09/2016   Bilateral edema of lower extremity    BMI 39.0-39.9,adult    Cancer    SKIN CANCER   Carpal tunnel syndrome 03/09/2011   Overview:   Last Assessment & Plan:  Clinically this seems to be carpal tunnel syndrome. Giving given her history of infection though we can keep in the back of our mind the idea that she will have a cervical spine problem but I think this is unlikely at this point in time medics carpal tunnel syndrome fits clinically this was a positive Tinel's sign however in for a brace for her.   Chronic vaginitis    Chronic venous insufficiency    Depression    Dyslipidemia    Fatigue 10/13/2015   Fibromyalgia    GERD without esophagitis    History of immune thrombocytopenia 03/09/2011   History of operative procedure on hip 03/10/2012   History of TTP (thrombotic thrombocytopenic purpura)    Hypokalemia    Lichen sclerosus et atrophicus of the vulva 11/30/2015   Major depressive disorder, recurrent, moderate    Methicillin susceptible Staphylococcus aureus infection 03/09/2011   Overview:  Last Assessment & Plan:  This is undoubtedlythe culprit organism   Migraine    Morbid obesity with BMI of 45.0-49.9, adult 10/13/2015   Morbidly obese    MSSA (methicillin susceptible Staphylococcus aureus) infection    Near syncope    Osteoarthritis, chronic    Pain due to total hip replacement 09/20/2011   Palpitations 03/04/2019   Postmenopausal    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 syndrome    20 yrs ago-not seen anyone for 10 yrs.   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 Mild diverticulosis. Small internal hemorrhoids. Healed anal fissure   ENDOVENOUS ABLATION SAPHENOUS VEIN W/ LASER Right 04/02/2019   endovenous laser ablation right greater saphenous vein by [name] MD    JOINT REPLACEMENT  08/2009   right hip replacement   REVISION TOTAL HIP ARTHROPLASTY     TONSILLECTOMY  1963    TOTAL HIP REVISION  10/08/2011   Procedure: TOTAL HIP REVISION;  Surgeon: Mauri Pole;  Location: [facility];  Service: Orthopedics;  Laterality: Right;  Resection of Right Total Hip/Extended Trochanteric Osteotomy/Placement of Antibiotic Total Hip Cemented by Depuy   TOTAL HIP REVISION  03/10/2012   Procedure: TOTAL HIP REVISION;  Surgeon: Mauri Pole, MD;  Location: [facility];  Service: Orthopedics;  Laterality: Right;  Reimplantation/Revision of a Right Total Hip and Removal of Cemented Implant    SOCIAL HISTORY: Social History   Socioeconomic History   Marital status: Married    Spouse name: Liliane Channel   Number of children: Not on file   Years of education: 12+7   Highest education level: Bachelor's degree (e.g., BA, AB, BS)  Occupational History   Occupation: Product manager: [employer]  Tobacco Use   Smoking status: Former    Packs/day: 1.50    Years: 20.00    Additional pack years: 0.00    Total pack years: 30.00    Types: Cigarettes    Quit date: 10/04/1990  Years since quitting: 32.3   Smokeless tobacco: Former    Quit date: 03/09/1991  Vaping Use   Vaping Use: Never used  Substance and Sexual Activity   Alcohol use: Yes    Comment: OCCASIONAL   Drug use: No   Sexual activity: Yes    Partners: Male  Other Topics Concern   Not on file  Social History Narrative   Not on file   Social Determinants of Health   Financial Resource Strain: Not on file  Food Insecurity: Not on file  Transportation Needs: Not on file  Physical Activity: Not on file  Stress: Not on file  Social Connections: Not on file  Intimate Partner Violence: Not on file    FAMILY HISTORY Family History  Problem Relation Age of Onset   Hypertension Mother    Hypertension Father    Alzheimer's disease Father    Prostate cancer Father    Hypertension Maternal Grandfather    Seizures Other    Colon cancer Neg Hx    Stomach cancer Neg Hx    Rectal cancer Neg Hx    Esophageal  cancer Neg Hx     ALLERGIES:  has No Known Allergies.  MEDICATIONS:  Current Outpatient Medications  Medication Sig Dispense Refill   buPROPion (WELLBUTRIN XL) 150 MG 24 hr tablet Take 150 mg by mouth every evening.     DULoxetine (CYMBALTA) 60 MG capsule Take 60 mg by mouth at bedtime.      esomeprazole (NEXIUM) 40 MG capsule Take 40 mg by mouth daily.     Multiple Vitamin (MULTIVITAMIN WITH MINERALS) TABS tablet Take 1 tablet by mouth daily.     divalproex (DEPAKOTE) 250 MG DR tablet Take 250 mg by mouth 2 (two) times daily.     meloxicam (MOBIC) 7.5 MG tablet Take 7.5 mg by mouth every morning.     nortriptyline (PAMELOR) 10 MG capsule Take 10 mg by mouth.     SUMAtriptan (IMITREX) 25 MG tablet Take 25 mg by mouth.     Current Facility-Administered Medications  Medication Dose Route Frequency Provider Last Rate Last Admin   triamcinolone acetonide (KENALOG-40) injection 20 mg  20 mg Other Once Landis Martins, DPM        PHYSICAL EXAMINATION:  ECOG PERFORMANCE STATUS: 1 - Symptomatic but completely ambulatory   Vitals:   01/15/23 1507  BP: 129/72  Pulse: 88  Resp: 18  Temp: 97.9 F (36.6 C)  SpO2: 98%    Filed Weights   01/15/23 1507  Weight: 249 lb 11.2 oz (113.3 kg)     Physical Exam Vitals and nursing note reviewed.  Constitutional:      General: She is not in acute distress.    Appearance: Normal appearance. She is obese. She is not ill-appearing, toxic-appearing or diaphoretic.  HENT:     Head: Normocephalic and atraumatic.     Right Ear: External ear normal.     Left Ear: External ear normal.     Nose: Nose normal. No congestion or rhinorrhea.  Eyes:     General: No scleral icterus.    Extraocular Movements: Extraocular movements intact.     Conjunctiva/sclera: Conjunctivae normal.     Pupils: Pupils are equal, round, and reactive to light.  Cardiovascular:     Rate and Rhythm: Normal rate and regular rhythm.     Heart sounds: No murmur heard.     No friction rub. No gallop.  Pulmonary:     Effort: Pulmonary effort is  normal. No respiratory distress.     Breath sounds: Normal breath sounds. No stridor. No wheezing or rhonchi.  Abdominal:     General: Bowel sounds are normal.     Palpations: Abdomen is soft.     Tenderness: There is no abdominal tenderness. There is no guarding or rebound.  Musculoskeletal:        General: No swelling, tenderness or deformity.     Cervical back: Normal range of motion and neck supple. No rigidity or tenderness.     Right lower leg: Edema present.     Left lower leg: Edema present.  Lymphadenopathy:     Head:     Right side of head: No submental, submandibular, tonsillar, preauricular, posterior auricular or occipital adenopathy.     Left side of head: No submental, submandibular, tonsillar, preauricular, posterior auricular or occipital adenopathy.     Cervical: No cervical adenopathy.     Right cervical: No superficial, deep or posterior cervical adenopathy.    Left cervical: No superficial, deep or posterior cervical adenopathy.     Upper Body:     Right upper body: No supraclavicular, axillary, pectoral or epitrochlear adenopathy.     Left upper body: No supraclavicular, axillary, pectoral or epitrochlear adenopathy.  Skin:    General: Skin is warm.     Coloration: Skin is not jaundiced.  Neurological:     General: No focal deficit present.     Mental Status: She is alert and oriented to person, place, and time.     Cranial Nerves: No cranial nerve deficit.     Motor: No weakness.  Psychiatric:        Mood and Affect: Mood normal.        Behavior: Behavior normal.        Thought Content: Thought content normal.        Judgment: Judgment normal.      LABORATORY DATA: I have personally reviewed the data as listed:  Appointment on 01/15/2023  Component Date Value Ref Range Status   Normetanephrine, Free 01/15/2023 53.8  0.0 - 285.2 pg/mL Final   Comment: (NOTE) This test was  developed and its performance characteristics determined by [facility]. It has not been cleared or approved by the Food and Drug Administration.    Metanephrine, Free 01/15/2023 <25.0  0.0 - 88.0 pg/mL Final   Comment: (NOTE) This test was developed and its performance characteristics determined by [facility]. It has not been cleared or approved by the Food and Drug Administration. Performed At: [facility] [place], [place] [id number] Rush Farmer MD [id number]    Chromogranin A (ng/mL) 01/15/2023 563.5 (H)  0.0 - 101.8 ng/mL Final   Comment: (NOTE) Chromogranin A performed by [employer]mof[employer]/BRAH[employer] KRYPTOR methodology Values obtained with different assay methods or kits cannot be used interchangeably. Performed At: [facility] [place], [place] [id number] Rush Farmer MD [id number]    CEA 01/15/2023 2.9  0.0 - 4.7 ng/mL Final   Comment: (NOTE)                             Nonsmokers          <3.9                             Smokers             <5.6 Roche [employer] Electrochemiluminescence Immunoassay (ECLIA)  Values obtained with different assay methods or kits cannot be used interchangeably.  Results cannot be interpreted as absolute evidence of the presence or absence of malignant disease. Performed At: [facility] [place], [place] [id number] Rush Farmer MD [id number]   Office Visit on 01/15/2023  Component Date Value Ref Range Status   WBC 01/15/2023 6.4  4.0 - 10.5 K/uL Final   RBC 01/15/2023 4.34  3.87 - 5.11 MIL/uL Final   Hemoglobin 01/15/2023 12.5  12.0 - 15.0 g/dL Final   HCT 01/15/2023 38.2  36.0 - 46.0 % Final   MCV 01/15/2023 88.0  80.0 - 100.0 fL Final   MCH 01/15/2023 28.8  26.0 - 34.0 pg Final   MCHC 01/15/2023 32.7  30.0 - 36.0 g/dL Final   RDW 01/15/2023 13.3  11.5 - 15.5 % Final   Platelets 01/15/2023 178  150 - 400 K/uL Final   nRBC 01/15/2023 0.0  0.0 - 0.2 % Final    Neutrophils Relative % 01/15/2023 62  % Final   Neutro Abs 01/15/2023 4.1  1.7 - 7.7 K/uL Final   Lymphocytes Relative 01/15/2023 25  % Final   Lymphs Abs 01/15/2023 1.6  0.7 - 4.0 K/uL Final   Monocytes Relative 01/15/2023 10  % Final   Monocytes Absolute 01/15/2023 0.6  0.1 - 1.0 K/uL Final   Eosinophils Relative 01/15/2023 2  % Final   Eosinophils Absolute 01/15/2023 0.1  0.0 - 0.5 K/uL Final   Basophils Relative 01/15/2023 1  % Final   Basophils Absolute 01/15/2023 0.0  0.0 - 0.1 K/uL Final   Immature Granulocytes 01/15/2023 0  % Final   Abs Immature Granulocytes 01/15/2023 0.01  0.00 - 0.07 K/uL Final   Performed at [facility], [place] [address], [place], [place] [address]   Sodium 01/15/2023 142  135 - 145 mmol/L Final   Potassium 01/15/2023 4.0  3.5 - 5.1 mmol/L Final   Chloride 01/15/2023 106  98 - 111 mmol/L Final   CO2 01/15/2023 28  22 - 32 mmol/L Final   Glucose, Bld 01/15/2023 88  70 - 99 mg/dL Final   Glucose reference range applies only to samples taken after fasting for at least 8 hours.   BUN 01/15/2023 13  8 - 23 mg/dL Final   Creatinine, Ser 01/15/2023 0.65  0.44 - 1.00 mg/dL Final   Calcium 01/15/2023 9.2  8.9 - 10.3 mg/dL Final   Total Protein 01/15/2023 7.0  6.5 - 8.1 g/dL Final   Albumin 01/15/2023 4.3  3.5 - 5.0 g/dL Final   AST 01/15/2023 20  15 - 41 U/L Final   ALT 01/15/2023 19  0 - 44 U/L Final   Alkaline Phosphatase 01/15/2023 98  38 - 126 U/L Final   Total Bilirubin 01/15/2023 0.6  0.3 - 1.2 mg/dL Final   GFR, Estimated 01/15/2023 >60  >60 mL/min Final   Comment: (NOTE) Calculated using the CKD-EPI Creatinine Equation (2021)    Anion gap 01/15/2023 8  5 - 15 Final   Performed at [facility], [place] [address] [place], [place] [address]    RADIOGRAPHIC STUDIES: I have personally reviewed the radiological images as listed and agree with the findings in the report  No results found.  ASSESSMENT/PLAN  67 y.o.  female is here because of  colon cancer.  Medical history notable for septic arthritis of the hip, obesity, atypical chest pain, skin cancer, carpal tunnel syndrome, chronic vaginitis, chronic venous and sufficiency, TTP,  syncope  Adenocarcinoma of colon Stage (unstaged); January 10, 2023: Colonoscopy for surveillance demonstrated  ulcerated 2 cm nonobstructing small mass with heaped up margins in distal transverse colon. Mass was partially circumferential (involves < 1/3rd of the lumen circumference). 10 mm polyp in proximal ascending colon which was sessile.  Pathology ulcerated mass- invasive moderately differentiated adenocarcinoma arising within a tubular adenoma with high-grade dysplasia   January 15 2023- Will refer patient for CT CAP for staging and obtain cbc with diff, cmp, CEA  Diarrhea:  January 15 2023- Not explained by the colon cancer.  No evidence of inflammatory bowel disease by colonoscopy.  Will send chromogranin A    Cancer Staging  No matching staging information was found for the patient.   No problem-specific Assessment & Plan notes found for this encounter.    Orders Placed This Encounter  Procedures   CT CHEST ABDOMEN PELVIS W CONTRAST    Standing Status:   Future    Standing Expiration Date:   01/15/2024    Order Specific Question:   Preferred imaging location?    Answer:   External    Order Specific Question:   Is Oral Contrast requested for this exam?    Answer:   Yes, Per Radiology protocol   CEA    Standing Status:   Future    Number of Occurrences:   1    Standing Expiration Date:   01/15/2024   Chromogranin A    Standing Status:   Future    Number of Occurrences:   1    Standing Expiration Date:   01/15/2024   Metanephrines, plasma    Standing Status:   Future    Number of Occurrences:   1    Standing Expiration Date:   01/15/2024   CBC with Differential/Platelet   Comprehensive metabolic panel    67  minutes was spent in patient care.  This included  time spent preparing to see the patient (e.g., review of tests), obtaining and/or reviewing separately obtained history, counseling and educating the patient/family/caregiver, ordering medications, tests, or procedures; documenting clinical information in the electronic or other health record, independently interpreting results and communicating results to the patient/family/caregiver as well as coordination of care.       All questions were answered. The patient knows to call the clinic with any problems, questions or concerns.  This note was electronically signed.    Barbee Cough, MD  01/22/2023 5:52 PM

## 2023-01-15 NOTE — Telephone Encounter (Signed)
Faxed received from Dr. Kendell Bane office stating pt appointment time and date. Pt was made aware. 01/22/2023 at 9:45. Pt made aware Address and phone number  provided.  Pt verbalized understanding with all questions answered.

## 2023-01-16 ENCOUNTER — Telehealth: Payer: Self-pay | Admitting: Oncology

## 2023-01-16 NOTE — Telephone Encounter (Signed)
01/16/27 Spoke with patient and scheduled CT SCAN on 01/17/23@430pm 

## 2023-01-17 DIAGNOSIS — K8020 Calculus of gallbladder without cholecystitis without obstruction: Secondary | ICD-10-CM | POA: Diagnosis not present

## 2023-01-17 DIAGNOSIS — C189 Malignant neoplasm of colon, unspecified: Secondary | ICD-10-CM | POA: Diagnosis not present

## 2023-01-17 DIAGNOSIS — E041 Nontoxic single thyroid nodule: Secondary | ICD-10-CM | POA: Diagnosis not present

## 2023-01-17 DIAGNOSIS — K6389 Other specified diseases of intestine: Secondary | ICD-10-CM | POA: Diagnosis not present

## 2023-01-17 DIAGNOSIS — R160 Hepatomegaly, not elsewhere classified: Secondary | ICD-10-CM | POA: Diagnosis not present

## 2023-01-17 DIAGNOSIS — E042 Nontoxic multinodular goiter: Secondary | ICD-10-CM | POA: Diagnosis not present

## 2023-01-17 DIAGNOSIS — C184 Malignant neoplasm of transverse colon: Secondary | ICD-10-CM | POA: Diagnosis not present

## 2023-01-17 LAB — CEA: CEA: 2.9 ng/mL (ref 0.0–4.7)

## 2023-01-17 LAB — CHROMOGRANIN A: Chromogranin A (ng/mL): 563.5 ng/mL — ABNORMAL HIGH (ref 0.0–101.8)

## 2023-01-18 DIAGNOSIS — E876 Hypokalemia: Secondary | ICD-10-CM | POA: Diagnosis not present

## 2023-01-18 DIAGNOSIS — R0789 Other chest pain: Secondary | ICD-10-CM | POA: Diagnosis not present

## 2023-01-18 DIAGNOSIS — Z79899 Other long term (current) drug therapy: Secondary | ICD-10-CM | POA: Diagnosis not present

## 2023-01-18 DIAGNOSIS — C189 Malignant neoplasm of colon, unspecified: Secondary | ICD-10-CM | POA: Diagnosis not present

## 2023-01-18 DIAGNOSIS — R519 Headache, unspecified: Secondary | ICD-10-CM | POA: Diagnosis not present

## 2023-01-18 DIAGNOSIS — G43909 Migraine, unspecified, not intractable, without status migrainosus: Secondary | ICD-10-CM | POA: Diagnosis not present

## 2023-01-21 DIAGNOSIS — F4321 Adjustment disorder with depressed mood: Secondary | ICD-10-CM | POA: Diagnosis not present

## 2023-01-21 LAB — METANEPHRINES, PLASMA
Metanephrine, Free: <25 pg/mL (ref 0.0–88.0)
Normetanephrine, Free: 53.8 pg/mL (ref 0.0–285.2)

## 2023-01-22 ENCOUNTER — Encounter: Payer: Self-pay | Admitting: Oncology

## 2023-01-22 ENCOUNTER — Inpatient Hospital Stay: Payer: Medicare PPO | Attending: Oncology | Admitting: Oncology

## 2023-01-22 VITALS — BP 117/73 | HR 83 | Temp 97.7°F | Resp 18 | Ht 65.9 in | Wt 250.4 lb

## 2023-01-22 DIAGNOSIS — R0789 Other chest pain: Secondary | ICD-10-CM | POA: Insufficient documentation

## 2023-01-22 DIAGNOSIS — Z79899 Other long term (current) drug therapy: Secondary | ICD-10-CM | POA: Insufficient documentation

## 2023-01-22 DIAGNOSIS — K7460 Unspecified cirrhosis of liver: Secondary | ICD-10-CM | POA: Diagnosis not present

## 2023-01-22 DIAGNOSIS — C184 Malignant neoplasm of transverse colon: Secondary | ICD-10-CM | POA: Insufficient documentation

## 2023-01-22 DIAGNOSIS — R197 Diarrhea, unspecified: Secondary | ICD-10-CM | POA: Insufficient documentation

## 2023-01-22 DIAGNOSIS — Z85828 Personal history of other malignant neoplasm of skin: Secondary | ICD-10-CM | POA: Insufficient documentation

## 2023-01-22 DIAGNOSIS — Z87891 Personal history of nicotine dependence: Secondary | ICD-10-CM | POA: Insufficient documentation

## 2023-01-22 DIAGNOSIS — D3A00 Benign carcinoid tumor of unspecified site: Secondary | ICD-10-CM | POA: Insufficient documentation

## 2023-01-22 DIAGNOSIS — Z6841 Body Mass Index (BMI) 40.0 and over, adult: Secondary | ICD-10-CM | POA: Diagnosis not present

## 2023-01-22 DIAGNOSIS — K219 Gastro-esophageal reflux disease without esophagitis: Secondary | ICD-10-CM | POA: Diagnosis not present

## 2023-01-22 DIAGNOSIS — Z8042 Family history of malignant neoplasm of prostate: Secondary | ICD-10-CM | POA: Insufficient documentation

## 2023-01-22 NOTE — Progress Notes (Signed)
[facility] Cancer Initial Visit:  Patient Care Team: Hurshel Party, NP as PCP - General (Internal Medicine)  CHIEF COMPLAINTS/PURPOSE OF CONSULTATION:  Oncology History  Colon cancer  01/15/2023 Initial Diagnosis   Colon cancer (HCC)     HISTORY OF PRESENTING ILLNESS: [name] 67 y.o. female is here because of  colon cancer Medical history notable for septic arthritis of the hip, obesity, atypical chest pain, skin cancer, carpal tunnel syndrome, chronic vaginitis, chronic venous and sufficiency, TTP, syncope  January 10, 2023: Colonoscopy-ulcerated 2 cm nonobstructing small mass with heaped up margins found in distal transverse colon.  Mass was partially circumferential (involves less than one third of the lumen circumference) no bleeding present.  10 mm polyp in proximal ascending colon which was sessile.  Pathology-Ascending colon polyp was compatible with a sessile serrated adenoma without cytologic dysplasia Transverse colon polyp-invasive moderately differentiated adenocarcinoma arising within a tubular adenoma with high-grade dysplasia  January 15 2023: [facility] Consult Patient reports that the colonoscopy was performed for surveillance; however, she was also having episodes of intermittent diarrhea x 2 to 3 months.  Stools were not bloody, nor was she passing mucus but they were soft.  In association with these episodes she would also feel dizzy and note an odd smell, nausea.   To see Dr. Logan Bores from surgery on January 22 2023   Social:  Married.  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  Tobacco quit 24 years.  Rare glass of wine  [facility] Mother alive 57 epileptic, dementia Father died 8 Alzheimers, prostate cancer Brother alive 66 arthritis requiring joint replacement,   WBC 6.4 hemoglobin 12.5 MCV 88 platelet count 178 normal differential CMP normal CEA 2.9 chromogranin A 564  January 17 2023:  CT CAP Short segment asymmetric wall thickening  of the transverse colon measuring 3.3 cm in length, compatible with reported history of  primary colonic neoplasm.  Small adjacent soft tissue nodule/lymph nodes adjacent to the colonic wall thickening measuring 6 mm, nonspecific are suspicious  for local nodal disease. Prominent/mildly enlarged right upper quadrant lymph nodes are similar dating back to January 26, 2019 and favored reactive.  No convincing evidence of distant metastatic disease within the chest, abdomen or pelvis.  Hepatomegaly with nodular hepatic contour, suggestive of cirrhosis Cholelithiasis without findings of acute cholecystitis.   Incidental right thyroid nodule measuring   January 18 2023:  Presented to [facility] ED with meadaches and chest discomfort.  CT PA negative for PE.  CT head without contrast negative.    January 22 2023:  Scheduled follow up for colon cancer.  Reviewed results of labs and imaging with patient and husband. Will repeat chromgranin A and refer for PET/CT if chromogranin A still significantly elevated.    Review of Systems  Constitutional:  Negative for appetite change, chills, fatigue, fever and unexpected weight change.       Has gained weight due to change in diet  HENT:   Negative for mouth sores, nosebleeds, sore throat, tinnitus and trouble swallowing.   Eyes:  Negative for eye problems and icterus.       Vision changes:  None  Respiratory:  Negative for chest tightness, hemoptysis, shortness of breath and wheezing.        One month of nonproductive cough.  No history of seasonable allergies  Cardiovascular:  Negative for chest pain and leg swelling.       Intermittent palpitations.  Chronic LE edema  Endocrine: Negative  for hot flashes.       Cold intolerance:  none Heat intolerance:  none  Genitourinary:  Negative for bladder incontinence, difficulty urinating, dysuria, frequency, hematuria and nocturia.   Musculoskeletal:  Negative for arthralgias, back pain, gait problem, myalgias, neck  pain and neck stiffness.  Skin:  Negative for itching, rash and wound.  Neurological:  Negative for dizziness, extremity weakness, gait problem, headaches, numbness, seizures and speech difficulty.  Hematological:  Negative for adenopathy. Bruises/bleeds easily.  Psychiatric/Behavioral:  Negative for sleep disturbance and suicidal ideas. The patient is not nervous/anxious.     MEDICAL HISTORY: Past Medical History:  Diagnosis Date   Abnormal glucose    Achilles tendinitis of left lower extremity    Acute suppurative arthritis due to bacteria 03/09/2011   Overview:  Last Assessment & Plan:  Methicillin sensitive staphylococcal aureus septic hip. She clearly does need I&D of the hip. I will have her continue the doxycycline for now but stop it 7 days prior to her surgery to maximize the yield on the cultures in the operating room though I be shocked if we don't find methicillin sensitive staph aureus again.. I agree with removing as much of the pros   Anal fissure 11/30/2015   Arthritis    right hip   Atypical chest pain 04/09/2016   Bilateral edema of lower extremity    BMI 39.0-39.9,adult    Cancer    SKIN CANCER   Carpal tunnel syndrome 03/09/2011   Overview:  Last Assessment & Plan:  Clinically this seems to be carpal tunnel syndrome. Giving given her history of infection though we can keep in the back of our mind the idea that she will have a cervical spine problem but I think this is unlikely at this point in time medics carpal tunnel syndrome fits clinically this was a positive Tinel's sign however in for a brace for her.   Chronic vaginitis    Chronic venous insufficiency    Depression    Dyslipidemia    Fatigue 10/13/2015   Fibromyalgia    GERD without esophagitis    History of immune thrombocytopenia 03/09/2011   History of operative procedure on hip 03/10/2012   History of TTP (thrombotic thrombocytopenic purpura)    Hypokalemia    Lichen sclerosus et atrophicus of the  vulva 11/30/2015   Major depressive disorder, recurrent, moderate    Methicillin susceptible Staphylococcus aureus infection 03/09/2011   Overview:  Last Assessment & Plan:  This is undoubtedlythe culprit organism   Migraine    Morbid obesity with BMI of 45.0-49.9, adult 10/13/2015   Morbidly obese    MSSA (methicillin susceptible Staphylococcus aureus) infection    Near syncope    Osteoarthritis, chronic    Pain due to total hip replacement 09/20/2011   Palpitations 03/04/2019   Postmenopausal    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 syndrome    20 yrs ago-not seen anyone for 10 yrs.   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 Mild diverticulosis. Small internal hemorrhoids. Healed anal fissure   ENDOVENOUS ABLATION SAPHENOUS VEIN W/ LASER Right 04/02/2019   endovenous laser ablation right greater saphenous vein by Waverly Ferrari MD    JOINT REPLACEMENT  08/2009   right hip replacement   REVISION TOTAL HIP ARTHROPLASTY  TONSILLECTOMY  1963   TOTAL HIP REVISION  10/08/2011   Procedure: TOTAL HIP REVISION;  Surgeon: [name];  Location: [facility];  Service: Orthopedics;  Laterality: Right;  Resection of Right Total Hip/Extended Trochanteric Osteotomy/Placement of Antibiotic Total Hip Cemented by Depuy   TOTAL HIP REVISION  03/10/2012   Procedure: TOTAL HIP REVISION;  Surgeon: [name], MD;  Location: [facility];  Service: Orthopedics;  Laterality: Right;  Reimplantation/Revision of a Right Total Hip and Removal of Cemented Implant    SOCIAL HISTORY: Social History   Socioeconomic History   Marital status: Married    Spouse name: Raiford Noble   Number of children: Not on file   Years of education: 12+7   Highest education level: Bachelor's degree (e.g., BA, AB,  BS)  Occupational History   Occupation: Magazine features editor: [employer]  Tobacco Use   Smoking status: Former    Packs/day: 1.50    Years: 20.00    Additional pack years: 0.00    Total pack years: 30.00    Types: Cigarettes    Quit date: 10/04/1990    Years since quitting: 32.3   Smokeless tobacco: Former    Quit date: 03/09/1991  Vaping Use   Vaping Use: Never used  Substance and Sexual Activity   Alcohol use: Yes    Comment: OCCASIONAL   Drug use: No   Sexual activity: Yes    Partners: Male  Other Topics Concern   Not on file  Social History Narrative   Not on file   Social Determinants of Health   Financial Resource Strain: Not on file  Food Insecurity: Not on file  Transportation Needs: Not on file  Physical Activity: Not on file  Stress: Not on file  Social Connections: Not on file  Intimate Partner Violence: Not on file    FAMILY HISTORY Family History  Problem Relation Age of Onset   Hypertension Mother    Hypertension Father    Alzheimer's disease Father    Prostate cancer Father    Hypertension Maternal Grandfather    Seizures Other    Colon cancer Neg Hx    Stomach cancer Neg Hx    Rectal cancer Neg Hx    Esophageal cancer Neg Hx     ALLERGIES:  has No Known Allergies.  MEDICATIONS:  Current Outpatient Medications  Medication Sig Dispense Refill   divalproex (DEPAKOTE) 250 MG DR tablet Take 250 mg by mouth 2 (two) times daily.     meloxicam (MOBIC) 7.5 MG tablet Take 7.5 mg by mouth every morning.     nortriptyline (PAMELOR) 10 MG capsule Take 10 mg by mouth.     SUMAtriptan (IMITREX) 25 MG tablet Take 25 mg by mouth.     buPROPion (WELLBUTRIN XL) 150 MG 24 hr tablet Take 150 mg by mouth every evening.     DULoxetine (CYMBALTA) 60 MG capsule Take 60 mg by mouth at bedtime.      esomeprazole (NEXIUM) 40 MG capsule Take 40 mg by mouth daily.     Multiple Vitamin (MULTIVITAMIN WITH MINERALS) TABS tablet Take 1 tablet by mouth daily.      Current Facility-Administered Medications  Medication Dose Route Frequency Provider Last Rate Last Admin   triamcinolone acetonide (KENALOG-40) injection 20 mg  20 mg Other Once Asencion Islam, DPM        PHYSICAL EXAMINATION:  ECOG PERFORMANCE STATUS: 1 - Symptomatic but completely ambulatory   Vitals:   01/22/23 1609  BP: 117/73  Pulse: 83  Resp: 18  Temp: 97.7 F (36.5 C)  SpO2: 98%    Filed Weights   01/22/23 1609  Weight: 250 lb 6.4 oz (113.6 kg)     Physical Exam Vitals and nursing note reviewed.  Constitutional:      General: She is not in acute distress.    Appearance: Normal appearance. She is obese. She is not ill-appearing, toxic-appearing or diaphoretic.  HENT:     Head: Normocephalic and atraumatic.     Right Ear: External ear normal.     Left Ear: External ear normal.     Nose: Nose normal. No congestion or rhinorrhea.  Eyes:     General: No scleral icterus.    Extraocular Movements: Extraocular movements intact.     Conjunctiva/sclera: Conjunctivae normal.     Pupils: Pupils are equal, round, and reactive to light.  Cardiovascular:     Rate and Rhythm: Normal rate.     Heart sounds: No murmur heard.    No friction rub. No gallop.  Abdominal:     General: Bowel sounds are normal.     Palpations: Abdomen is soft.     Tenderness: There is no abdominal tenderness. There is no guarding or rebound.  Musculoskeletal:        General: No swelling, tenderness or deformity.     Cervical back: Normal range of motion and neck supple. No rigidity or tenderness.  Lymphadenopathy:     Head:     Right side of head: No submental, submandibular, tonsillar, preauricular, posterior auricular or occipital adenopathy.     Left side of head: No submental, submandibular, tonsillar, preauricular, posterior auricular or occipital adenopathy.     Cervical: No cervical adenopathy.     Right cervical: No superficial, deep or posterior cervical adenopathy.    Left  cervical: No superficial, deep or posterior cervical adenopathy.     Upper Body:     Right upper body: No supraclavicular, axillary, pectoral or epitrochlear adenopathy.     Left upper body: No supraclavicular, axillary, pectoral or epitrochlear adenopathy.  Skin:    General: Skin is warm.     Coloration: Skin is not jaundiced.  Neurological:     General: No focal deficit present.     Mental Status: She is alert and oriented to person, place, and time. Mental status is at baseline.     Cranial Nerves: No cranial nerve deficit.  Psychiatric:        Mood and Affect: Mood normal.        Behavior: Behavior normal.        Thought Content: Thought content normal.        Judgment: Judgment normal.      LABORATORY DATA: I have personally reviewed the data as listed:  Appointment on 01/15/2023  Component Date Value Ref Range Status   Normetanephrine, Free 01/15/2023 53.8  0.0 - 285.2 pg/mL Final   Comment: (NOTE) This test was developed and its performance characteristics determined by [facility]. It has not been cleared or approved by the Food and Drug Administration.    Metanephrine, Free 01/15/2023 <25.0  0.0 - 88.0 pg/mL Final   Comment: (NOTE) This test was developed and its performance characteristics determined by [facility]. It has not been cleared or approved by the Food and Drug Administration. Performed At: [facility] [address] [place], [place] [id number] Jolene Schimke MD WU:[id number]    Chromogranin A (ng/mL) 01/15/2023 563.5 (H)  0.0 - 101.8 ng/mL  Final   Comment: (NOTE) Chromogranin A performed by Thermofisher/BRAHMS KRYPTOR methodology Values obtained with different assay methods or kits cannot be used interchangeably. Performed At: [facility] [address] [place], [place] [id number] Jolene Schimke MD ZH:[id number]    CEA 01/15/2023 2.9  0.0 - 4.7 ng/mL Final   Comment: (NOTE)                             Nonsmokers          <3.9                              Smokers             <5.6 Roche [employer]ostics Electrochemiluminescence Immunoassay (ECLIA) Values obtained with different assay methods or kits cannot be used interchangeably.  Results cannot be interpreted as absolute evidence of the presence or absence of malignant disease. Performed At: [facility] [address] [place], [place] [id number] Jolene Schimke MD [id number]   Office Visit on 01/15/2023  Component Date Value Ref Range Status   WBC 01/15/2023 6.4  4.0 - 10.5 K/uL Final   RBC 01/15/2023 4.34  3.87 - 5.11 MIL/uL Final   Hemoglobin 01/15/2023 12.5  12.0 - 15.0 g/dL Final   HCT 44/12/4740 38.2  36.0 - 46.0 % Final   MCV 01/15/2023 88.0  80.0 - 100.0 fL Final   MCH 01/15/2023 28.8  26.0 - 34.0 pg Final   MCHC 01/15/2023 32.7  30.0 - 36.0 g/dL Final   RDW 59/56/3875 13.3  11.5 - 15.5 % Final   Platelets 01/15/2023 178  150 - 400 K/uL Final   nRBC 01/15/2023 0.0  0.0 - 0.2 % Final   Neutrophils Relative % 01/15/2023 62  % Final   Neutro Abs 01/15/2023 4.1  1.7 - 7.7 K/uL Final   Lymphocytes Relative 01/15/2023 25  % Final   Lymphs Abs 01/15/2023 1.6  0.7 - 4.0 K/uL Final   Monocytes Relative 01/15/2023 10  % Final   Monocytes Absolute 01/15/2023 0.6  0.1 - 1.0 K/uL Final   Eosinophils Relative 01/15/2023 2  % Final   Eosinophils Absolute 01/15/2023 0.1  0.0 - 0.5 K/uL Final   Basophils Relative 01/15/2023 1  % Final   Basophils Absolute 01/15/2023 0.0  0.0 - 0.1 K/uL Final   Immature Granulocytes 01/15/2023 0  % Final   Abs Immature Granulocytes 01/15/2023 0.01  0.00 - 0.07 K/uL Final   Performed at [facility], [address] [place], [place] [address]   Sodium 01/15/2023 142  135 - 145 mmol/L Final   Potassium 01/15/2023 4.0  3.5 - 5.1 mmol/L Final   Chloride 01/15/2023 106  98 - 111 mmol/L Final   CO2 01/15/2023 28  22 - 32 mmol/L Final   Glucose, Bld 01/15/2023 88  70 - 99 mg/dL Final   Glucose reference range  applies only to samples taken after fasting for at least 8 hours.   BUN 01/15/2023 13  8 - 23 mg/dL Final   Creatinine, Ser 01/15/2023 0.65  0.44 - 1.00 mg/dL Final   Calcium 95/18/8416 9.2  8.9 - 10.3 mg/dL Final   Total Protein 60/63/0160 7.0  6.5 - 8.1 g/dL Final   Albumin 10/93/2355 4.3  3.5 - 5.0 g/dL Final   AST 73/22/0254 20  15 - 41 U/L Final   ALT 01/15/2023 19  0 -  44 U/L Final   Alkaline Phosphatase 01/15/2023 98  38 - 126 U/L Final   Total Bilirubin 01/15/2023 0.6  0.3 - 1.2 mg/dL Final   GFR, Estimated 01/15/2023 >60  >60 mL/min Final   Comment: (NOTE) Calculated using the CKD-EPI Creatinine Equation (2021)    Anion gap 01/15/2023 8  5 - 15 Final   Performed at [facility], [address], [place], [place] [address]    RADIOGRAPHIC STUDIES: I have personally reviewed the radiological images as listed and agree with the findings in the report  No results found.  ASSESSMENT/PLAN  67 y.o. female is here because of  colon cancer.  Medical history notable for septic arthritis of the hip, obesity, atypical chest pain, skin cancer, carpal tunnel syndrome, chronic vaginitis, chronic venous and sufficiency, TTP, syncope   Adenocarcinoma of colon Stage (unstaged); January 10, 2023: Colonoscopy for surveillance demonstrated  ulcerated 2 cm nonobstructing small mass with heaped up margins in distal transverse colon. Mass was partially circumferential (involves < 1/3rd of the lumen circumference). 10 mm polyp in proximal ascending colon which was sessile.  Pathology ulcerated mass- invasive moderately differentiated adenocarcinoma arising within a tubular adenoma with high-grade dysplasia   January 15 2023- CEA 2.9 Chromogranin A 564  January 17 2023- CT CAP  Short segment asymmetric wall thickening of the transverse colon measuring 3.3 cm in length.   Small adjacent soft tissue nodule/lymph nodes adjacent to the colonic wall thickening measuring 6 mm, suspicious  for  local nodal disease. Prominent/mildly enlarged right upper quadrant lymph nodes similar to January 26, 2019 and favored reactive.  No distant metastatic disease.  Hepatomegaly with nodular hepatic contour, suggestive of cirrhosis Cholelithiasis without findings of acute cholecystitis.   Incidental right thyroid nodule   January 22 2023- will need transverse colectomy by surgery     Diarrhea:             January 15 2023- Not explained by the colon cancer.  No evidence of inflammatory bowel disease by colonoscopy.    Chromogranin A 564   January 22 2023- Will repeat Chromogranin A and if still significantly elevated refer for Dotatate PET/CT   Possible cirrhosis  January 17 2023- Nodular liver contour noted on CT CAP    Cancer Staging  No matching staging information was found for the patient.   No problem-specific Assessment & Plan notes found for this encounter.    No orders of the defined types were placed in this encounter.   35  minutes was spent in patient care.  This included time spent preparing to see the patient (e.g., review of tests), obtaining and/or reviewing separately obtained history, counseling and educating the patient/family/caregiver, ordering medications, tests, or procedures; documenting clinical information in the electronic or other health record, independently interpreting results and communicating results to the patient/family/caregiver as well as coordination of care.       All questions were answered. The patient knows to call the clinic with any problems, questions or concerns.  This note was electronically signed.    Loni Muse, MD  01/22/2023 4:14 PM

## 2023-01-23 ENCOUNTER — Inpatient Hospital Stay: Payer: Medicare PPO

## 2023-01-23 DIAGNOSIS — D3A00 Benign carcinoid tumor of unspecified site: Secondary | ICD-10-CM

## 2023-01-23 DIAGNOSIS — Z87891 Personal history of nicotine dependence: Secondary | ICD-10-CM | POA: Diagnosis not present

## 2023-01-23 DIAGNOSIS — C184 Malignant neoplasm of transverse colon: Secondary | ICD-10-CM | POA: Diagnosis present

## 2023-01-23 DIAGNOSIS — R197 Diarrhea, unspecified: Secondary | ICD-10-CM | POA: Diagnosis not present

## 2023-01-23 DIAGNOSIS — R0789 Other chest pain: Secondary | ICD-10-CM | POA: Diagnosis not present

## 2023-01-23 DIAGNOSIS — Z79899 Other long term (current) drug therapy: Secondary | ICD-10-CM | POA: Diagnosis not present

## 2023-01-23 DIAGNOSIS — Z85828 Personal history of other malignant neoplasm of skin: Secondary | ICD-10-CM | POA: Diagnosis not present

## 2023-01-23 DIAGNOSIS — Z8042 Family history of malignant neoplasm of prostate: Secondary | ICD-10-CM | POA: Diagnosis not present

## 2023-01-24 LAB — CHROMOGRANIN A: Chromogranin A (ng/mL): 294 ng/mL — ABNORMAL HIGH (ref 0.0–101.8)

## 2023-01-28 DIAGNOSIS — H524 Presbyopia: Secondary | ICD-10-CM | POA: Diagnosis not present

## 2023-01-28 DIAGNOSIS — H5203 Hypermetropia, bilateral: Secondary | ICD-10-CM | POA: Diagnosis not present

## 2023-01-28 DIAGNOSIS — Z9849 Cataract extraction status, unspecified eye: Secondary | ICD-10-CM | POA: Diagnosis not present

## 2023-01-28 DIAGNOSIS — Z961 Presence of intraocular lens: Secondary | ICD-10-CM | POA: Diagnosis not present

## 2023-01-28 DIAGNOSIS — K7460 Unspecified cirrhosis of liver: Secondary | ICD-10-CM | POA: Insufficient documentation

## 2023-01-28 DIAGNOSIS — H52223 Regular astigmatism, bilateral: Secondary | ICD-10-CM | POA: Diagnosis not present

## 2023-01-29 ENCOUNTER — Telehealth: Payer: Self-pay

## 2023-01-29 DIAGNOSIS — F4321 Adjustment disorder with depressed mood: Secondary | ICD-10-CM | POA: Diagnosis not present

## 2023-01-29 NOTE — Telephone Encounter (Signed)
Patient has been advised that her chromoginin A level is better. She is aware that she has no need for the imaging studies previously ordered.

## 2023-02-01 DIAGNOSIS — R519 Headache, unspecified: Secondary | ICD-10-CM | POA: Diagnosis not present

## 2023-02-01 DIAGNOSIS — R0789 Other chest pain: Secondary | ICD-10-CM | POA: Diagnosis not present

## 2023-02-01 DIAGNOSIS — C189 Malignant neoplasm of colon, unspecified: Secondary | ICD-10-CM | POA: Diagnosis not present

## 2023-02-04 DIAGNOSIS — H9313 Tinnitus, bilateral: Secondary | ICD-10-CM | POA: Diagnosis not present

## 2023-02-04 DIAGNOSIS — G839 Paralytic syndrome, unspecified: Secondary | ICD-10-CM | POA: Diagnosis not present

## 2023-02-04 DIAGNOSIS — F419 Anxiety disorder, unspecified: Secondary | ICD-10-CM | POA: Diagnosis not present

## 2023-02-04 DIAGNOSIS — G40909 Epilepsy, unspecified, not intractable, without status epilepticus: Secondary | ICD-10-CM | POA: Diagnosis not present

## 2023-02-04 DIAGNOSIS — C184 Malignant neoplasm of transverse colon: Secondary | ICD-10-CM | POA: Diagnosis not present

## 2023-02-04 DIAGNOSIS — Q438 Other specified congenital malformations of intestine: Secondary | ICD-10-CM | POA: Diagnosis not present

## 2023-02-04 DIAGNOSIS — F32A Depression, unspecified: Secondary | ICD-10-CM | POA: Diagnosis not present

## 2023-02-04 DIAGNOSIS — R1084 Generalized abdominal pain: Secondary | ICD-10-CM | POA: Diagnosis not present

## 2023-02-04 DIAGNOSIS — K7460 Unspecified cirrhosis of liver: Secondary | ICD-10-CM | POA: Diagnosis not present

## 2023-02-04 DIAGNOSIS — F329 Major depressive disorder, single episode, unspecified: Secondary | ICD-10-CM | POA: Diagnosis not present

## 2023-02-04 DIAGNOSIS — H9190 Unspecified hearing loss, unspecified ear: Secondary | ICD-10-CM | POA: Diagnosis not present

## 2023-02-04 DIAGNOSIS — H532 Diplopia: Secondary | ICD-10-CM | POA: Diagnosis not present

## 2023-02-05 ENCOUNTER — Encounter: Payer: Self-pay | Admitting: Oncology

## 2023-02-12 DIAGNOSIS — R4189 Other symptoms and signs involving cognitive functions and awareness: Secondary | ICD-10-CM | POA: Diagnosis not present

## 2023-02-12 DIAGNOSIS — R404 Transient alteration of awareness: Secondary | ICD-10-CM | POA: Diagnosis not present

## 2023-02-12 DIAGNOSIS — C184 Malignant neoplasm of transverse colon: Secondary | ICD-10-CM | POA: Diagnosis not present

## 2023-02-15 DIAGNOSIS — C184 Malignant neoplasm of transverse colon: Secondary | ICD-10-CM | POA: Diagnosis not present

## 2023-02-15 DIAGNOSIS — R404 Transient alteration of awareness: Secondary | ICD-10-CM | POA: Diagnosis not present

## 2023-02-15 DIAGNOSIS — R413 Other amnesia: Secondary | ICD-10-CM | POA: Diagnosis not present

## 2023-02-17 DIAGNOSIS — S61451A Open bite of right hand, initial encounter: Secondary | ICD-10-CM | POA: Diagnosis not present

## 2023-02-17 DIAGNOSIS — W540XXA Bitten by dog, initial encounter: Secondary | ICD-10-CM | POA: Diagnosis not present

## 2023-02-18 DIAGNOSIS — S61451A Open bite of right hand, initial encounter: Secondary | ICD-10-CM | POA: Diagnosis not present

## 2023-02-18 DIAGNOSIS — W540XXA Bitten by dog, initial encounter: Secondary | ICD-10-CM | POA: Diagnosis not present

## 2023-02-19 DIAGNOSIS — F4321 Adjustment disorder with depressed mood: Secondary | ICD-10-CM | POA: Diagnosis not present

## 2023-02-25 ENCOUNTER — Other Ambulatory Visit: Payer: Self-pay | Admitting: Pharmacist

## 2023-02-25 ENCOUNTER — Other Ambulatory Visit (HOSPITAL_COMMUNITY): Payer: Self-pay

## 2023-02-25 ENCOUNTER — Encounter: Payer: Self-pay | Admitting: Oncology

## 2023-02-25 ENCOUNTER — Telehealth: Payer: Self-pay

## 2023-02-25 ENCOUNTER — Telehealth: Payer: Self-pay | Admitting: Pharmacy Technician

## 2023-02-25 ENCOUNTER — Inpatient Hospital Stay: Payer: Medicare PPO

## 2023-02-25 ENCOUNTER — Inpatient Hospital Stay: Payer: Medicare PPO | Attending: Oncology | Admitting: Oncology

## 2023-02-25 VITALS — BP 136/77 | HR 88 | Temp 98.3°F | Resp 16 | Ht 65.9 in | Wt 249.8 lb

## 2023-02-25 DIAGNOSIS — Z5111 Encounter for antineoplastic chemotherapy: Secondary | ICD-10-CM | POA: Insufficient documentation

## 2023-02-25 DIAGNOSIS — C184 Malignant neoplasm of transverse colon: Secondary | ICD-10-CM

## 2023-02-25 DIAGNOSIS — Z79899 Other long term (current) drug therapy: Secondary | ICD-10-CM | POA: Insufficient documentation

## 2023-02-25 DIAGNOSIS — I878 Other specified disorders of veins: Secondary | ICD-10-CM | POA: Diagnosis not present

## 2023-02-25 NOTE — Progress Notes (Signed)
START ON PATHWAY REGIMEN - Colorectal     A cycle is every 21 days:     Capecitabine      Oxaliplatin   **Always confirm dose/schedule in your pharmacy ordering system**  Patient Characteristics: Postoperative without Neoadjuvant Therapy, M0 (Pathologic Staging), Colon, Stage IIB/C Tumor Location: Colon Therapeutic Status: Postoperative without Neoadjuvant Therapy, M0 (Pathologic Staging) AJCC M Category: cM0 AJCC T Category: pT4a AJCC N Category: pN0 AJCC 8 Stage Grouping: IIB Intent of Therapy: Curative Intent, Discussed with Patient

## 2023-02-25 NOTE — Progress Notes (Signed)
Cancer Center Cancer Initial Visit:  Patient Care Team: Hurshel Party, NP as PCP - General (Internal Medicine)  CHIEF COMPLAINTS/PURPOSE OF CONSULTATION:  Oncology History  Colon cancer (HCC)  01/15/2023 Initial Diagnosis   Colon cancer (HCC)   02/25/2023 Cancer Staging   Staging form: Colon and Rectum, AJCC 8th Edition - Clinical stage from 02/25/2023: Stage IIB (cT4a, cN0, cM0) - Signed by Loni Muse, MD on 02/25/2023 Histopathologic type: Adenocarcinoma, NOS Stage prefix: Initial diagnosis Total positive nodes: 0 Total nodes examined: 30 Histologic grade (G): G3 Histologic grading system: 4 grade system   03/11/2023 -  Chemotherapy   Patient is on Treatment Plan : COLORECTAL Xelox (Capeox)(130/850) q21d       HISTORY OF PRESENTING ILLNESS: [name] 67 y.o. female is here because of  colon cancer Medical history notable for septic arthritis of the hip, obesity, atypical chest pain, skin cancer, carpal tunnel syndrome, chronic vaginitis, chronic venous and sufficiency, TTP, syncope  January 10, 2023: Colonoscopy-ulcerated 2 cm nonobstructing small mass with heaped up margins found in distal transverse colon.  Mass was partially circumferential (involves less than one third of the lumen circumference) no bleeding present.  10 mm polyp in proximal ascending colon which was sessile.  Pathology-Ascending colon polyp was compatible with a sessile serrated adenoma without cytologic dysplasia Transverse colon polyp-invasive moderately differentiated adenocarcinoma arising within a tubular adenoma with high-grade dysplasia  January 15 2023: [facility] Medical Oncology Consult Patient reports that the colonoscopy was performed for surveillance; however, she was also having episodes of intermittent diarrhea x 2 to 3 months.  Stools were not bloody, nor was she passing mucus but they were soft.  In association with these episodes she would also feel dizzy and note an odd  smell, nausea.   To see Dr. Logan Bores from surgery on January 22 2023   Social:  Married.  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  Tobacco quit 24 years.  Rare glass of wine  [facility] Mother alive 35 epileptic, dementia Father died 22 Alzheimers, prostate cancer Brother alive 70 arthritis requiring joint replacement,   WBC 6.4 hemoglobin 12.5 MCV 88 platelet count 178 normal differential CMP normal CEA 2.9 chromogranin A 564  January 17 2023:  CT CAP Short segment asymmetric wall thickening of the transverse colon measuring 3.3 cm in length, compatible with reported history of  primary colonic neoplasm.  Small adjacent soft tissue nodule/lymph nodes adjacent to the colonic wall thickening measuring 6 mm, nonspecific are suspicious  for local nodal disease. Prominent/mildly enlarged right upper quadrant lymph nodes are similar dating back to January 26, 2019 and favored reactive.  No convincing evidence of distant metastatic disease within the chest, abdomen or pelvis.  Hepatomegaly with nodular hepatic contour, suggestive of cirrhosis Cholelithiasis without findings of acute cholecystitis.   Incidental right thyroid nodule measuring   January 18 2023:  Presented to [facility] ED with headaches and chest discomfort.  CT PA negative for PE.  CT head without contrast negative.    January 22 2023:  Scheduled follow up for colon cancer.  Reviewed results of labs and imaging with patient and husband. Will repeat chromgranin A and refer for PET/CT if chromogranin A still significantly elevated.    January 23, 2023: Chromogranin A 294  February 04 2023:  Transverse colectomy Pathology showed adenocarcinoma invading visceral peritoneum, Grade 3.  All of 30 lymph nodes were negative for tumor.  Surgical Stage (T4a, N0 M0)  MSI intact  Feb 25, 2023: Scheduled follow-up regarding colon cancer.  Reviewed results of surgical pathology with patient and husband.  Per NCCN guidelines adjuvant options are CAPEOX, FOLFOX 6  months, Capecitabine or observation.  Discussed risks and benefits of each  Review of Systems  Constitutional:  Negative for appetite change, chills, fatigue, fever and unexpected weight change.       Has gained weight due to change in diet  HENT:   Negative for mouth sores, nosebleeds, sore throat, tinnitus and trouble swallowing.   Eyes:  Negative for eye problems and icterus.       Vision changes:  None  Respiratory:  Negative for chest tightness, hemoptysis, shortness of breath and wheezing.        One month of nonproductive cough.  No history of seasonable allergies  Cardiovascular:  Negative for chest pain and leg swelling.       Intermittent palpitations.  Chronic LE edema  Endocrine: Negative for hot flashes.       Cold intolerance:  none Heat intolerance:  none  Genitourinary:  Negative for bladder incontinence, difficulty urinating, dysuria, frequency, hematuria and nocturia.   Musculoskeletal:  Negative for arthralgias, back pain, gait problem, myalgias, neck pain and neck stiffness.  Skin:  Negative for itching, rash and wound.  Neurological:  Negative for dizziness, extremity weakness, gait problem, headaches, numbness, seizures and speech difficulty.  Hematological:  Negative for adenopathy. Bruises/bleeds easily.  Psychiatric/Behavioral:  Negative for sleep disturbance and suicidal ideas. The patient is not nervous/anxious.     MEDICAL HISTORY: Past Medical History:  Diagnosis Date   Abnormal glucose    Achilles tendinitis of left lower extremity    Acute suppurative arthritis due to bacteria (HCC) 03/09/2011   Overview:  Last Assessment & Plan:  Methicillin sensitive staphylococcal aureus septic hip. She clearly does need I&D of the hip. I will have her continue the doxycycline for now but stop it 7 days prior to her surgery to maximize the yield on the cultures in the operating room though I be shocked if we don't find methicillin sensitive staph aureus again.. I agree  with removing as much of the pros   Anal fissure 11/30/2015   Arthritis    right hip   Atypical chest pain 04/09/2016   Bilateral edema of lower extremity    BMI 39.0-39.9,adult    Cancer [facility])    SKIN CANCER   Carpal tunnel syndrome 03/09/2011   Overview:  Last Assessment & Plan:  Clinically this seems to be carpal tunnel syndrome. Giving given her history of infection though we can keep in the back of our mind the idea that she will have a cervical spine problem but I think this is unlikely at this point in time medics carpal tunnel syndrome fits clinically this was a positive Tinel's sign however in for a brace for her.   Chronic vaginitis    Chronic venous insufficiency    Depression    Dyslipidemia    Fatigue 10/13/2015   Fibromyalgia    GERD without esophagitis    History of immune thrombocytopenia 03/09/2011   History of operative procedure on hip 03/10/2012   History of TTP (thrombotic thrombocytopenic purpura)    Hypokalemia    Lichen sclerosus et atrophicus of the vulva 11/30/2015   Major depressive disorder, recurrent, moderate (HCC)    Methicillin susceptible Staphylococcus aureus infection 03/09/2011   Overview:  Last Assessment & Plan:  This is undoubtedlythe culprit organism   Migraine    Morbid  obesity with BMI of 45.0-49.9, adult (HCC) 10/13/2015   Morbidly obese (HCC)    MSSA (methicillin susceptible Staphylococcus aureus) infection    Near syncope    Osteoarthritis, chronic    Pain due to total hip replacement (HCC) 09/20/2011   Palpitations 03/04/2019   Postmenopausal    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 syndrome (HCC)    20 yrs ago-not seen anyone for 10 yrs.   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 Mild diverticulosis.  Small internal hemorrhoids. Healed anal fissure   ENDOVENOUS ABLATION SAPHENOUS VEIN W/ LASER Right 04/02/2019   endovenous laser ablation right greater saphenous vein by Waverly Ferrari MD    JOINT REPLACEMENT  08/2009   right hip replacement   REVISION TOTAL HIP ARTHROPLASTY     TONSILLECTOMY  1963   TOTAL HIP REVISION  10/08/2011   Procedure: TOTAL HIP REVISION;  Surgeon: [name];  Location: [facility];  Service: Orthopedics;  Laterality: Right;  Resection of Right Total Hip/Extended Trochanteric Osteotomy/Placement of Antibiotic Total Hip Cemented by Depuy   TOTAL HIP REVISION  03/10/2012   Procedure: TOTAL HIP REVISION;  Surgeon: [name], MD;  Location: [facility];  Service: Orthopedics;  Laterality: Right;  Reimplantation/Revision of a Right Total Hip and Removal of Cemented Implant    SOCIAL HISTORY: Social History   Socioeconomic History   Marital status: Married    Spouse name: Raiford Noble   Number of children: Not on file   Years of education: 12+7   Highest education level: Bachelor's degree (e.g., BA, AB, BS)  Occupational History   Occupation: Magazine features editor: [employer]  Tobacco Use   Smoking status: Former    Packs/day: 1.50    Years: 20.00    Additional pack years: 0.00    Total pack years: 30.00    Types: Cigarettes    Quit date: 10/04/1990    Years since quitting: 32.4   Smokeless tobacco: Former    Quit date: 03/09/1991  Vaping Use   Vaping Use: Never used  Substance and Sexual Activity   Alcohol use: Yes    Comment: OCCASIONAL   Drug use: No   Sexual activity: Yes    Partners: Male  Other Topics Concern   Not on file  Social History Narrative   Not on file   Social Determinants of Health   Financial Resource Strain: Not on file  Food Insecurity: Not on file  Transportation Needs: Not on file  Physical Activity: Not on file  Stress: Not on file  Social Connections: Not on file  Intimate Partner Violence: Not on file     FAMILY HISTORY Family History  Problem Relation Age of Onset   Hypertension Mother    Hypertension Father    Alzheimer's disease Father    Prostate cancer Father    Hypertension Maternal Grandfather    Seizures Other    Colon cancer Neg Hx    Stomach cancer Neg Hx    Rectal cancer Neg Hx    Esophageal cancer Neg Hx     ALLERGIES:  has No Known Allergies.  MEDICATIONS:  Current Outpatient Medications  Medication Sig Dispense Refill   buPROPion (WELLBUTRIN XL) 150 MG 24 hr tablet Take 150 mg by mouth  every evening.     divalproex (DEPAKOTE) 250 MG DR tablet Take 250 mg by mouth 2 (two) times daily.     DULoxetine (CYMBALTA) 60 MG capsule Take 60 mg by mouth at bedtime.      esomeprazole (NEXIUM) 40 MG capsule Take 40 mg by mouth daily.     meloxicam (MOBIC) 7.5 MG tablet Take 7.5 mg by mouth every morning.     Multiple Vitamin (MULTIVITAMIN WITH MINERALS) TABS tablet Take 1 tablet by mouth daily.     nortriptyline (PAMELOR) 10 MG capsule Take 10 mg by mouth.     SUMAtriptan (IMITREX) 25 MG tablet Take 25 mg by mouth.     Current Facility-Administered Medications  Medication Dose Route Frequency Provider Last Rate Last Admin   triamcinolone acetonide (KENALOG-40) injection 20 mg  20 mg Other Once Asencion Islam, DPM        PHYSICAL EXAMINATION:  ECOG PERFORMANCE STATUS: 1 - Symptomatic but completely ambulatory   Vitals:   02/25/23 1339  BP: 136/77  Pulse: 88  Resp: 16  Temp: 98.3 F (36.8 C)  SpO2: 98%    Filed Weights   02/25/23 1339  Weight: 249 lb 12.8 oz (113.3 kg)     Physical Exam Vitals and nursing note reviewed.  Constitutional:      General: She is not in acute distress.    Appearance: Normal appearance. She is obese. She is not ill-appearing, toxic-appearing or diaphoretic.  HENT:     Head: Normocephalic and atraumatic.     Right Ear: External ear normal.     Left Ear: External ear normal.     Nose: Nose normal. No congestion or  rhinorrhea.  Eyes:     General: No scleral icterus.    Extraocular Movements: Extraocular movements intact.     Conjunctiva/sclera: Conjunctivae normal.     Pupils: Pupils are equal, round, and reactive to light.  Cardiovascular:     Rate and Rhythm: Normal rate.     Heart sounds: No murmur heard.    No friction rub. No gallop.  Abdominal:     General: Bowel sounds are normal.     Palpations: Abdomen is soft.     Tenderness: There is no abdominal tenderness. There is no guarding or rebound.  Musculoskeletal:        General: No swelling, tenderness or deformity.     Cervical back: Normal range of motion and neck supple. No rigidity or tenderness.  Lymphadenopathy:     Head:     Right side of head: No submental, submandibular, tonsillar, preauricular, posterior auricular or occipital adenopathy.     Left side of head: No submental, submandibular, tonsillar, preauricular, posterior auricular or occipital adenopathy.     Cervical: No cervical adenopathy.     Right cervical: No superficial, deep or posterior cervical adenopathy.    Left cervical: No superficial, deep or posterior cervical adenopathy.     Upper Body:     Right upper body: No supraclavicular, axillary, pectoral or epitrochlear adenopathy.     Left upper body: No supraclavicular, axillary, pectoral or epitrochlear adenopathy.  Skin:    General: Skin is warm.     Coloration: Skin is not jaundiced.  Neurological:     General: No focal deficit present.     Mental Status: She is alert and oriented to person, place, and time. Mental status is at baseline.     Cranial Nerves: No cranial nerve deficit.  Psychiatric:  Mood and Affect: Mood normal.        Behavior: Behavior normal.        Thought Content: Thought content normal.        Judgment: Judgment normal.      LABORATORY DATA: I have personally reviewed the data as listed:  No visits with results within 1 Month(s) from this visit.  Latest known visit with  results is:  Appointment on 01/23/2023  Component Date Value Ref Range Status   Chromogranin A (ng/mL) 01/23/2023 294.0 (H)  0.0 - 101.8 ng/mL Final   Comment: (NOTE) Chromogranin A performed by Thermofisher/BRAHMS KRYPTOR methodology Values obtained with different assay methods or kits cannot be used interchangeably. Performed At: [facility] [address] [place], [place] [id number] Jolene Schimke MD WU:[id number]     RADIOGRAPHIC STUDIES: I have personally reviewed the radiological images as listed and agree with the findings in the report  No results found.  ASSESSMENT/PLAN  67 y.o. female is here because of  colon cancer.  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 Mass was partially circumferential (involves < 1/3rd of the lumen circumference). 10 mm polyp in proximal ascending colon which was sessile.  Pathology ulcerated mass- invasive moderately differentiated adenocarcinoma arising within a tubular adenoma with high-grade dysplasia   January 15 2023- CEA 2.9 Chromogranin A 564  January 17 2023- CT CAP  Short segment asymmetric wall thickening of the transverse colon measuring 3.3 cm in length.   Small adjacent soft tissue nodule/lymph nodes adjacent to the colonic wall thickening measuring 6 mm, suspicious  for local nodal disease. Prominent/mildly enlarged right upper quadrant lymph nodes similar to January 26, 2019 and favored reactive.  No distant metastatic disease.  Hepatomegaly with nodular hepatic contour, suggestive of cirrhosis Cholelithiasis without findings of acute cholecystitis.   Incidental  right thyroid nodule   January 23 2023- Chromogranin A 294  February 04 2023:  Transverse colectomy.  Pathology showed adenocarcinoma invading visceral peritoneum, Grade 3.  All of 30 lymph nodes negative for tumor.  MSI intact Feb 25 2023- On basis of spontaneous improvement in chromogranin A will hold on Dodatate PET.     Therapeutics-  Feb 25 2023- Reviewed NCCN treatment guidelines with patient and husband.  They are agreeable to proceed with adjuvant CAPOX 6 6 months.  Will arrange for formal chemotherapy teaching   Diarrhea:             January 15 2023- Not explained by the colon cancer.  No evidence of inflammatory bowel disease by colonoscopy.    Chromogranin A 564  January 23 2023- Repeat Chromogranin A improved  Feb 25 2023- Diarrhea and chromogranin A improved therefore holding on Dotatate PET/CT    Poor venous access:  In preparation for chemotherapy we will arrange for port-a-cath placement.  We discussed benefits (more efficient access to administer chemotherapy and supportive care medications/IVF, imaging contrast, decreased risk of tissue damage from medications) and disadvantages (risks of infections, thrombosis, surgical procedure, periodic flushing).  Patient has elected to proceed with port placement.      Possible cirrhosis  January 17 2023- Nodular liver contour noted on CT CAP  Cancer Staging  Colon cancer [facility]) Staging form: Colon and Rectum, AJCC 8th Edition - Clinical stage from 02/25/2023: Stage IIB (cT4a, cN0, cM0) - Signed by Loni Muse, MD on 02/25/2023 Histopathologic type: Adenocarcinoma, NOS Stage prefix: Initial diagnosis Total positive nodes: 0 Total nodes examined: 30 Histologic grade (G): G3 Histologic grading system: 4 grade system    No problem-specific Assessment & Plan notes found for this encounter.    Orders Placed This Encounter  Procedures   CBC with Differential (Cancer Center Only)    Standing Status:   Future    Standing Expiration  Date:   03/10/2024   CMP (Cancer Center only)    Standing Status:   Future    Standing Expiration Date:   03/10/2024   Ambulatory referral to General Surgery    Referral Priority:   Routine    Referral Type:   Surgical    Referral Reason:   Specialty Services Required    Requested Specialty:   General Surgery    Number of Visits Requested:   1    41  minutes was spent in patient care.  This included time spent preparing to see the patient (e.g., review of tests), obtaining and/or reviewing separately obtained history, counseling and educating the patient/family/caregiver, ordering medications, tests, or procedures; documenting clinical information in the electronic or other health record, independently interpreting results and communicating results to the patient/family/caregiver as well as coordination of care.       All questions were answered. The patient knows to call the clinic with any problems, questions or concerns.  This note was electronically signed.    Loni Muse, MD  02/25/2023 5:13 PM

## 2023-02-25 NOTE — Telephone Encounter (Signed)
Oral Oncology Pharmacist Encounter  Received new prescription for xeloda (capecitabine) for the treatment of colon cancer in conjunction with oxalipla b tin, planned duration until disease progression or unacceptable toxicity or for 6 months.  Labs from 02/06/23 assessed, no interventions needed. Will need new baseline labs at office visit with me. Prescription dose and frequency assessed for appropriateness.    Current medication list in Epic reviewed, DDIs with Xeloda identified: - esomeprazole: PPIs can diminish therapeutic effect. Will discuss with patient to see about switching medication.  - nortriptyline: both agents are Qtc prolonging agents. No actions needed.  Evaluated chart and no patient barriers to medication adherence noted.   Patient agreement for treatment documented in MD note on 02/25/23.  Prescription has been e-scribed to the [facility] for benefits analysis and approval.  Oral Oncology Clinic will continue to follow for insurance authorization, copayment issues, initial counseling and start date.  Bethel Born, PharmD Hematology/Oncology Clinical Pharmacist [facility] Oral Chemotherapy Navigation Clinic [phone] 02/25/2023 3:54 PM

## 2023-02-25 NOTE — Telephone Encounter (Signed)
Oral Oncology Patient Advocate Encounter   Received notification that prior authorization for capecitabine is required.   PA submitted on 02/25/23 Key [id number] Status is pending     Jinger Neighbors, CPhT-Adv Oncology Pharmacy Patient Advocate [facility] Direct Number: [phone]  Fax: [phone]

## 2023-02-26 ENCOUNTER — Other Ambulatory Visit (HOSPITAL_COMMUNITY): Payer: Self-pay

## 2023-02-26 ENCOUNTER — Other Ambulatory Visit: Payer: Self-pay

## 2023-02-26 DIAGNOSIS — G9389 Other specified disorders of brain: Secondary | ICD-10-CM | POA: Diagnosis not present

## 2023-02-26 DIAGNOSIS — Z79899 Other long term (current) drug therapy: Secondary | ICD-10-CM | POA: Diagnosis not present

## 2023-02-26 DIAGNOSIS — R519 Headache, unspecified: Secondary | ICD-10-CM | POA: Diagnosis not present

## 2023-02-26 DIAGNOSIS — R439 Unspecified disturbances of smell and taste: Secondary | ICD-10-CM | POA: Diagnosis not present

## 2023-02-26 DIAGNOSIS — R9401 Abnormal electroencephalogram [EEG]: Secondary | ICD-10-CM | POA: Diagnosis not present

## 2023-02-26 DIAGNOSIS — R404 Transient alteration of awareness: Secondary | ICD-10-CM | POA: Diagnosis not present

## 2023-02-26 NOTE — Telephone Encounter (Signed)
Oral Oncology Patient Advocate Encounter  Prior Authorization for capecitabine has been approved.    PA# [id number] Effective dates: 02/25/23 through 10/22/23  Patients co-pay is $50.    Jinger Neighbors, CPhT-Adv Oncology Pharmacy Patient Advocate [facility] Direct Number: [phone]  Fax: [phone]

## 2023-02-27 ENCOUNTER — Inpatient Hospital Stay: Payer: Medicare PPO

## 2023-02-27 ENCOUNTER — Other Ambulatory Visit: Payer: Self-pay

## 2023-02-28 ENCOUNTER — Telehealth: Payer: Self-pay

## 2023-02-28 NOTE — Telephone Encounter (Signed)
Transition Care Management Follow-up Telephone Call Date of discharge and from where: 02/18/2023 [facility] How have you been since you were released from the hospital? Patient is feeling better Any questions or concerns? No  Items Reviewed: Did the pt receive and understand the discharge instructions provided? Yes  Medications obtained and verified?  Patient was told to take Tylenol. No medication prescibed Other? No  Any new allergies since your discharge? No  Dietary orders reviewed? Yes Do you have support at home? Yes   Follow up appointments reviewed:  PCP Hospital f/u appt confirmed?  Patient saw PCP 02/19/23  Scheduled to see  on  @ . Specialist Hospital f/u appt confirmed?  Patient stated she is seeing a wound specialist  Scheduled to see  on  @ . Are transportation arrangements needed? No  If their condition worsens, is the pt aware to call PCP or go to the Emergency Dept.? Yes Was the patient provided with contact information for the PCP's office or ED? Yes Was to pt encouraged to call back with questions or concerns? Yes  [name]  [facility] [place] [facility] Community Resource Care Guide   [email or web address]  ?? [phone]   Website: [email or web address]  [email or web address]

## 2023-03-04 DIAGNOSIS — R21 Rash and other nonspecific skin eruption: Secondary | ICD-10-CM | POA: Diagnosis not present

## 2023-03-05 ENCOUNTER — Other Ambulatory Visit: Payer: Self-pay

## 2023-03-05 ENCOUNTER — Other Ambulatory Visit (HOSPITAL_COMMUNITY): Payer: Self-pay

## 2023-03-05 DIAGNOSIS — F4321 Adjustment disorder with depressed mood: Secondary | ICD-10-CM | POA: Diagnosis not present

## 2023-03-05 MED ORDER — CAPECITABINE 500 MG PO TABS
1000.0000 mg/m2 | ORAL_TABLET | Freq: Two times a day (BID) | ORAL | 7 refills | Status: DC
Start: 2023-03-05 — End: 2023-03-05

## 2023-03-05 MED ORDER — CAPECITABINE 500 MG PO TABS
2000.0000 mg | ORAL_TABLET | Freq: Two times a day (BID) | ORAL | 7 refills | Status: DC
Start: 2023-03-05 — End: 2023-05-08
  Filled 2023-03-05: qty 112, 14d supply, fill #0
  Filled 2023-03-06: qty 112, 21d supply, fill #0
  Filled 2023-03-22: qty 112, 21d supply, fill #1
  Filled 2023-04-15: qty 112, 21d supply, fill #2

## 2023-03-05 NOTE — Progress Notes (Signed)
[facility] CARE CLINIC CONSULT NOTE [facility] [place] [phone]   [phone]   Patient Care Team: Hurshel Party, NP as PCP - General (Internal Medicine) Loni Muse, MD as Consulting Physician (Internal Medicine)   Name of the patient: [name]  [id number]  1956-07-28   Date of visit: 03/06/23  Diagnosis: Colon cancer  Chief complaint/Reason for visit- Initial Meeting for [facility], preparing for starting chemotherapy  Heme/Onc history:  Oncology History  Colon cancer (HCC)  01/15/2023 Initial Diagnosis   Colon cancer (HCC)   02/25/2023 Cancer Staging   Staging form: Colon and Rectum, AJCC 8th Edition - Clinical stage from 02/25/2023: Stage IIB (cT4a, cN0, cM0) - Signed by Loni Muse, MD on 02/25/2023 Histopathologic type: Adenocarcinoma, NOS Stage prefix: Initial diagnosis Total positive nodes: 0 Total nodes examined: 30 Histologic grade (G): G3 Histologic grading system: 4 grade system   03/11/2023 -  Chemotherapy   Patient is on Treatment Plan : COLORECTAL Xelox (Capeox)(130/850) q21d       Interval history-  The patient presents to chemo care clinic today for initial meeting in preparation for starting chemotherapy. I introduced the chemo care clinic and we discussed that the role of the clinic is to assist those who are at an increased risk of emergency room visits and/or complications during the course of chemotherapy treatment. We discussed that the increased risk takes into account factors such as age, performance status, and co-morbidities. We also discussed that for some, this might include barriers to care such as not having a primary care provider, lack of insurance/transportation, or not being able to afford medications. We discussed that the goal of the program is to help prevent unplanned ER visits and help reduce complications during chemotherapy. We do this by discussing specific risk factors to each  individual and identifying ways that we can help improve these risk factors and reduce barriers to care.   No Known Allergies  Past Medical History:  Diagnosis Date   Abnormal glucose    Achilles tendinitis of left lower extremity    Acute suppurative arthritis due to bacteria (HCC) 03/09/2011   Overview:  Last Assessment & Plan:  Methicillin sensitive staphylococcal aureus septic hip. She clearly does need I&D of the hip. I will have her continue the doxycycline for now but stop it 7 days prior to her surgery to maximize the yield on the cultures in the operating room though I be shocked if we don't find methicillin sensitive staph aureus again.. I agree with removing as much of the pros   Anal fissure 11/30/2015   Arthritis    right hip   Atypical chest pain 04/09/2016   Bilateral edema of lower extremity    BMI 39.0-39.9,adult    Cancer [facility])    SKIN CANCER   Carpal tunnel syndrome 03/09/2011   Overview:  Last Assessment & Plan:  Clinically this seems to be carpal tunnel syndrome. Giving given her history of infection though we can keep in the back of our mind the idea that she will have a cervical spine problem but I think this is unlikely at this point in time medics carpal tunnel syndrome fits clinically this was a positive Tinel's sign however in for a brace for her.   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   History of operative procedure on hip 03/10/2012   History of TTP (thrombotic thrombocytopenic purpura)    Hypokalemia    Lichen sclerosus et atrophicus of the vulva 11/30/2015   Major depressive disorder, recurrent, moderate (HCC)    Methicillin susceptible Staphylococcus aureus infection 03/09/2011   Overview:  Last Assessment & Plan:  This is undoubtedlythe culprit organism   Migraine    Morbid obesity with BMI of  45.0-49.9, adult (HCC) 10/13/2015   Morbidly obese (HCC)    MSSA (methicillin susceptible Staphylococcus aureus) infection    Near syncope    Osteoarthritis, chronic    Pain due to total hip replacement (HCC) 09/20/2011   Palpitations 03/04/2019   Postmenopausal    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 syndrome (HCC)    20 yrs ago-not seen anyone for 10 yrs.   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 Mild diverticulosis. Small internal hemorrhoids. Healed anal fissure   ENDOVENOUS ABLATION SAPHENOUS VEIN W/ LASER Right 04/02/2019   endovenous laser ablation right greater saphenous vein by Waverly Ferrari MD    JOINT REPLACEMENT  08/2009   right hip replacement   REVISION TOTAL HIP ARTHROPLASTY     TONSILLECTOMY  1963   TOTAL HIP REVISION  10/08/2011   Procedure: TOTAL HIP REVISION;  Surgeon: [name];  Location: [facility];  Service: Orthopedics;  Laterality: Right;  Resection of Right Total Hip/Extended Trochanteric Osteotomy/Placement of Antibiotic Total Hip Cemented by Depuy   TOTAL HIP REVISION  03/10/2012   Procedure: TOTAL HIP REVISION;  Surgeon: [name], MD;  Location: [facility];  Service: Orthopedics;  Laterality: Right;  Reimplantation/Revision of a Right Total Hip and Removal of Cemented Implant    Social History   Socioeconomic History   Marital status: Married    Spouse name: Raiford Noble   Number of children: Not on file   Years of education: 12+7   Highest education level: Bachelor's degree (e.g., BA, AB, BS)  Occupational History   Occupation: Magazine features editor: [employer]  Tobacco Use   Smoking status: Former    Packs/day: 1.50    Years: 20.00    Additional pack years: 0.00    Total pack years: 30.00    Types: Cigarettes    Quit date:  10/04/1990    Years since quitting: 32.4   Smokeless tobacco: Former    Quit date: 03/09/1991  Vaping Use   Vaping Use: Never used  Substance and Sexual Activity   Alcohol use: Yes    Comment: OCCASIONAL   Drug use: No   Sexual activity: Yes    Partners: Male  Other Topics Concern   Not on file  Social History Narrative   Not on file   Social Determinants of Health   Financial Resource Strain: Not on file  Food Insecurity: Not on file  Transportation Needs: Not on file  Physical Activity: Not on file  Stress: Not on file  Social Connections: Not on file  Intimate Partner Violence: Not on file    Family History  Problem Relation Age of Onset   Hypertension Mother    Hypertension Father    Alzheimer's disease Father    Prostate cancer Father    Hypertension Maternal Actor  Seizures Other    Colon cancer Neg Hx    Stomach cancer Neg Hx    Rectal cancer Neg Hx    Esophageal cancer Neg Hx      Current Outpatient Medications:    buPROPion (WELLBUTRIN XL) 150 MG 24 hr tablet, Take 150 mg by mouth every evening., Disp: , Rfl:    capecitabine (XELODA) 500 MG tablet, Take 4 tablets (2,000 mg total) by mouth 2 (two) times daily after a meal. Take 14 days on, then 7 days off, repeat every 21 days., Disp: 112 tablet, Rfl: 7   divalproex (DEPAKOTE) 250 MG DR tablet, Take 250 mg by mouth 2 (two) times daily., Disp: , Rfl:    DULoxetine (CYMBALTA) 60 MG capsule, Take 60 mg by mouth at bedtime. , Disp: , Rfl:    esomeprazole (NEXIUM) 40 MG capsule, Take 40 mg by mouth daily., Disp: , Rfl:    meloxicam (MOBIC) 7.5 MG tablet, Take 7.5 mg by mouth every morning., Disp: , Rfl:    Multiple Vitamin (MULTIVITAMIN WITH MINERALS) TABS tablet, Take 1 tablet by mouth daily., Disp: , Rfl:    nortriptyline (PAMELOR) 10 MG capsule, Take 10 mg by mouth., Disp: , Rfl:    SUMAtriptan (IMITREX) 25 MG tablet, Take 25 mg by mouth., Disp: , Rfl:   Current Facility-Administered Medications:     triamcinolone acetonide (KENALOG-40) injection 20 mg, 20 mg, Other, Once, Asencion Islam, DPM     Latest Ref Rng & Units 01/15/2023    4:08 PM  CMP  Glucose 70 - 99 mg/dL 88   BUN 8 - 23 mg/dL 13   Creatinine 1.61 - 1.00 mg/dL 0.96   Sodium 045 - 409 mmol/L 142   Potassium 3.5 - 5.1 mmol/L 4.0   Chloride 98 - 111 mmol/L 106   CO2 22 - 32 mmol/L 28   Calcium 8.9 - 10.3 mg/dL 9.2   Total Protein 6.5 - 8.1 g/dL 7.0   Total Bilirubin 0.3 - 1.2 mg/dL 0.6   Alkaline Phos 38 - 126 U/L 98   AST 15 - 41 U/L 20   ALT 0 - 44 U/L 19       Latest Ref Rng & Units 01/15/2023    4:08 PM  CBC  WBC 4.0 - 10.5 K/uL 6.4   Hemoglobin 12.0 - 15.0 g/dL 81.1   Hematocrit 91.4 - 46.0 % 38.2   Platelets 150 - 400 K/uL 178     No images are attached to the encounter.  No results found.   Assessment and plan-  The patient is a 67 y.o. female who presents to [facility] for initial meeting in preparation for starting chemotherapy for the treatment of  1. Malignant neoplasm of transverse colon (HCC)   .   Chemo Care Clinic/High Risk for ER/Hospitalization during chemotherapy- We discussed the role of the chemo care clinic and identified patient specific risk factors. I discussed that patient was identified as high risk primarily based on:  Patient has past medical history positive for: Past Medical History:  Diagnosis Date   Abnormal glucose    Achilles tendinitis of left lower extremity    Acute suppurative arthritis due to bacteria (HCC) 03/09/2011   Overview:  Last Assessment & Plan:  Methicillin sensitive staphylococcal aureus septic hip. She clearly does need I&D of the hip. I will have her continue the doxycycline for now but stop it 7 days prior to her surgery to maximize the yield on the cultures in  the operating room though I be shocked if we don't find methicillin sensitive staph aureus again.. I agree with removing as much of the pros   Anal fissure 11/30/2015   Arthritis    right  hip   Atypical chest pain 04/09/2016   Bilateral edema of lower extremity    BMI 39.0-39.9,adult    Cancer [facility])    SKIN CANCER   Carpal tunnel syndrome 03/09/2011   Overview:  Last Assessment & Plan:  Clinically this seems to be carpal tunnel syndrome. Giving given her history of infection though we can keep in the back of our mind the idea that she will have a cervical spine problem but I think this is unlikely at this point in time medics carpal tunnel syndrome fits clinically this was a positive Tinel's sign however in for a brace for her.   Chronic vaginitis    Chronic venous insufficiency    Depression    Dyslipidemia    Fatigue 10/13/2015   Fibromyalgia    GERD without esophagitis    History of immune thrombocytopenia 03/09/2011   History of operative procedure on hip 03/10/2012   History of TTP (thrombotic thrombocytopenic purpura)    Hypokalemia    Lichen sclerosus et atrophicus of the vulva 11/30/2015   Major depressive disorder, recurrent, moderate (HCC)    Methicillin susceptible Staphylococcus aureus infection 03/09/2011   Overview:  Last Assessment & Plan:  This is undoubtedlythe culprit organism   Migraine    Morbid obesity with BMI of 45.0-49.9, adult (HCC) 10/13/2015   Morbidly obese (HCC)    MSSA (methicillin susceptible Staphylococcus aureus) infection    Near syncope    Osteoarthritis, chronic    Pain due to total hip replacement (HCC) 09/20/2011   Palpitations 03/04/2019   Postmenopausal    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 syndrome (HCC)    20 yrs ago-not seen anyone for 10 yrs.   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 Mild diverticulosis. Small internal hemorrhoids. Healed anal fissure    ENDOVENOUS ABLATION SAPHENOUS VEIN W/ LASER Right 04/02/2019   endovenous laser ablation right greater saphenous vein by Waverly Ferrari MD    JOINT REPLACEMENT  08/2009   right hip replacement   REVISION TOTAL HIP ARTHROPLASTY     TONSILLECTOMY  1963   TOTAL HIP REVISION  10/08/2011   Procedure: TOTAL HIP REVISION;  Surgeon: [name];  Location: [facility];  Service: Orthopedics;  Laterality: Right;  Resection of Right Total Hip/Extended Trochanteric Osteotomy/Placement of Antibiotic Total Hip Cemented by Depuy   TOTAL HIP REVISION  03/10/2012   Procedure: TOTAL HIP REVISION;  Surgeon: [name], MD;  Location: [facility];  Service: Orthopedics;  Laterality: Right;  Reimplantation/Revision of a Right Total Hip and Removal of Cemented Implant    Provided general information including the following:  1.  Date of education: 03/06/2023 2.  Physician name: Dr. Leatha Gilding 3.  Diagnosis: Colon cancer 4.  Stage: IIB 5.  Cure  6.  Chemotherapy plan including drugs and how often: Oxaliplatin every 3 weeks with Xeloda for 2 weeks on and 1 week off 7.  Start date: 03/11/2023 8.  Other referrals: None at this time 9.  The patient is to  call our office with any questions or concerns.  Our office number [phone], if after hours or on the weekend, call the same number and wait for the answering service.  There is always an oncologist on call. 10.  Medications prescribed: ondansetron, prochlorperazine 11.  The patient has verbalized understanding of the treatment plan and has no barriers to adherence or understanding.   Obtained signed consent from patient.   Discussed symptoms including:  1.  Low blood counts including white blood cells red blood cells, and platelets.  If experience increased fatigue or abnormal bruising or bleeding, call our office. 2.  Infection including to avoid large crowds, wash hands frequently, and stay away from people who were sick.  If fever develops of  100.4 or higher, call our office. 3.  Mucositis:  Instructions on mouth rinse given (baking soda and salt mixture).  Keep mouth clean.  Use soft bristle toothbrush.  Avoid alcohol containing mouthwash.  If mouth sores develop, call our office. 4.  Nausea/vomiting:  Prescriptions given: ondansetron 8 mg every 8 hours as needed for nausea or vomiting and prochlorperazine 10 mg every 6 hours as needed for nausea or vomiting, may alternate these medications and take around the clock if persistent.  If nausea and vomiting is not controlled, call our office 5.  Diarrhea: Use over-the-counter Imodium.  Call our office if diarrhea is not controlled. 6.  Constipation: Use senna-S, 1 to 2 tablets twice a day.  Call our office if no BM in 2 to 3 days. 7.  Loss of appetite:  Try to eat small meals every 2-3 hours.  Call our office if not able to eat or drink. 8.  Taste changes:  Try zinc 50 mg daily.  If becomes severe call clinic. 9.  Drink 2 to 3 quarts of water per day. Call our office if not able to drink enough for urine to be pale yellow. 10. Avoid alcoholic beverages. 11. Peripheral neuropathy: Call office if numbness or tingling in hands or feet worsens or is suddenly severe. 12.  Ringing in the ears or hearing loss.  Call our office if this develops.    The patient was given written information printed from [employer] patient education on individual chemotherapy agents which includes: Name of medications Approved uses Dose and schedule Storage and handling Handling body fluids and waste Drug and food interactions Possible side effects and management Pregnancy, sexual activity, and contraception Obtaining medication   Gave information on the supportive care team and how to contact them regarding services.  Discussed advanced directives.  The patient does not have their advanced directives.   We discussed that social determinants of health may have significant impacts on health and outcomes for  cancer patients.  Today we discussed specific social determinants of performance status, alcohol use, depression, financial needs, food insecurity, housing, interpersonal violence, social connections, stress, tobacco use, and transportation.    After lengthy discussion the following were identified as areas of need:   Outpatient services: We discussed options including home based and outpatient services, DME, nutrition counseling, and supportive care program. We discussed that patients who participate in regular physical activity report fewer negative impacts of cancer and treatments and report less fatigue.   Financial Concerns: We discussed that living with cancer can create tremendous financial burden.  We discussed options for assistance. I asked that if assistance is needed in affording medications or paying bills to please let us know so that we can provide assistance. We discussed options  for food including social services.  Referral to Social work: Introduced Child psychotherapist Mady Haagensen and the services she can provide, such as support with utility bill, cell phone and gas vouchers.  Also introduced [place], LCSW, our licensed clinical social worker who provides counseling as needed.  Support groups: We discussed options for support groups at [facility]. We discussed options for managing stress including healthy eating and exercise, as well as participating in no charge counseling services at the cancer center and support groups.  If these are of interest, patient can notify either myself or primary nursing team.We discussed options for management including medications.  Transportation: We discussed options for transportation.  The patient will contact our office if she requires assistance with transportation.  Palliative care services: We have palliative care services available in the cancer center to discuss goals of care and advanced care planning.  Please let us know if you have any  questions or would like to speak to our palliative care practitioner.  Symptom Management Clinic: We discussed our symptom management clinic which is available for acute concerns while receiving treatment such as nausea, vomiting or diarrhea.  We can be reached via telephone at [phone].  We are available for virtual or in person visits on the same day from 9 to 4 PM Monday through Friday.   She denies needing specific assistance at this time.  She will be followed by Dr. Angelene Giovanni clinical team.   Disposition: RTC on 03/07/2023  Visit Diagnosis 1. Malignant neoplasm of transverse colon (HCC)     I discussed the assessment and treatment plan with the patient.  The patient was provided an opportunity to ask questions and all were answered. The patient expressed understanding and was in agreement with this plan. She also understands that she can call clinic at any time with any questions, concerns, or complaints.   I provided 15 minutes of face-to-face time during this encounter, and > 50% was spent counseling as documented under my assessment & plan.   Tyann Niehaus A. Sol Passer, PA-C [facility] [place] [phone]

## 2023-03-06 ENCOUNTER — Other Ambulatory Visit: Payer: Self-pay

## 2023-03-06 ENCOUNTER — Inpatient Hospital Stay (HOSPITAL_BASED_OUTPATIENT_CLINIC_OR_DEPARTMENT_OTHER): Payer: Medicare PPO

## 2023-03-06 ENCOUNTER — Encounter: Payer: Self-pay | Admitting: Hematology and Oncology

## 2023-03-06 ENCOUNTER — Inpatient Hospital Stay (INDEPENDENT_AMBULATORY_CARE_PROVIDER_SITE_OTHER): Payer: Medicare PPO | Admitting: Hematology and Oncology

## 2023-03-06 ENCOUNTER — Other Ambulatory Visit (HOSPITAL_COMMUNITY): Payer: Self-pay

## 2023-03-06 VITALS — BP 119/68 | HR 79 | Temp 98.1°F | Resp 18 | Ht 65.9 in | Wt 245.8 lb

## 2023-03-06 DIAGNOSIS — C184 Malignant neoplasm of transverse colon: Secondary | ICD-10-CM

## 2023-03-07 ENCOUNTER — Inpatient Hospital Stay: Payer: Medicare PPO

## 2023-03-07 ENCOUNTER — Inpatient Hospital Stay: Payer: Medicare PPO | Admitting: Oncology

## 2023-03-07 ENCOUNTER — Encounter: Payer: Self-pay | Admitting: Hematology and Oncology

## 2023-03-07 VITALS — BP 135/65 | HR 84 | Temp 98.1°F | Resp 16 | Ht 65.9 in | Wt 246.2 lb

## 2023-03-07 DIAGNOSIS — C184 Malignant neoplasm of transverse colon: Secondary | ICD-10-CM

## 2023-03-07 DIAGNOSIS — Z79899 Other long term (current) drug therapy: Secondary | ICD-10-CM | POA: Diagnosis not present

## 2023-03-07 DIAGNOSIS — Z5111 Encounter for antineoplastic chemotherapy: Secondary | ICD-10-CM | POA: Diagnosis not present

## 2023-03-07 DIAGNOSIS — I878 Other specified disorders of veins: Secondary | ICD-10-CM | POA: Diagnosis not present

## 2023-03-07 DIAGNOSIS — Z8669 Personal history of other diseases of the nervous system and sense organs: Secondary | ICD-10-CM

## 2023-03-07 LAB — CBC WITH DIFFERENTIAL (CANCER CENTER ONLY)
Abs Immature Granulocytes: 0.01 10*3/uL (ref 0.00–0.07)
Basophils Absolute: 0 10*3/uL (ref 0.0–0.1)
Basophils Relative: 0 %
Eosinophils Absolute: 0.2 10*3/uL (ref 0.0–0.5)
Eosinophils Relative: 3 %
HCT: 39.2 % (ref 36.0–46.0)
Hemoglobin: 12.5 g/dL (ref 12.0–15.0)
Immature Granulocytes: 0 %
Lymphocytes Relative: 22 %
Lymphs Abs: 1.2 10*3/uL (ref 0.7–4.0)
MCH: 28.3 pg (ref 26.0–34.0)
MCHC: 31.9 g/dL (ref 30.0–36.0)
MCV: 88.7 fL (ref 80.0–100.0)
Monocytes Absolute: 0.4 10*3/uL (ref 0.1–1.0)
Monocytes Relative: 8 %
Neutro Abs: 3.8 10*3/uL (ref 1.7–7.7)
Neutrophils Relative %: 67 %
Platelet Count: 159 10*3/uL (ref 150–400)
RBC: 4.42 MIL/uL (ref 3.87–5.11)
RDW: 13.7 % (ref 11.5–15.5)
WBC Count: 5.6 10*3/uL (ref 4.0–10.5)
nRBC: 0 % (ref 0.0–0.2)

## 2023-03-07 LAB — CMP (CANCER CENTER ONLY)
ALT: 21 U/L (ref 0–44)
AST: 19 U/L (ref 15–41)
Albumin: 4 g/dL (ref 3.5–5.0)
Alkaline Phosphatase: 90 U/L (ref 38–126)
Anion gap: 7 (ref 5–15)
BUN: 10 mg/dL (ref 8–23)
CO2: 27 mmol/L (ref 22–32)
Calcium: 9.4 mg/dL (ref 8.9–10.3)
Chloride: 106 mmol/L (ref 98–111)
Creatinine: 0.77 mg/dL (ref 0.44–1.00)
GFR, Estimated: >60 mL/min (ref >60)
Glucose, Bld: 122 mg/dL — ABNORMAL HIGH (ref 70–99)
Potassium: 4.2 mmol/L (ref 3.5–5.1)
Sodium: 140 mmol/L (ref 135–145)
Total Bilirubin: 0.6 mg/dL (ref 0.3–1.2)
Total Protein: 7 g/dL (ref 6.5–8.1)

## 2023-03-07 NOTE — Progress Notes (Signed)
[facility] Cancer Initial Visit:  Patient Care Team: Hurshel Party, Marissa as PCP - General (Internal Medicine) Loni Muse, Marissa as Consulting Physician (Internal Medicine)  CHIEF COMPLAINTS/PURPOSE OF CONSULTATION:  Oncology History  Colon cancer [facility])  01/15/2023 Initial Diagnosis   Colon cancer (HCC)   02/25/2023 Cancer Staging   Staging form: Colon and Rectum, AJCC 8th Edition - Clinical stage from 02/25/2023: Stage IIB (cT4a, cN0, cM0) - Signed by Loni Muse, Marissa on 02/25/2023 Histopathologic type: Adenocarcinoma, NOS Stage prefix: Initial diagnosis Total positive nodes: 0 Total nodes examined: 30 Histologic grade (G): G3 Histologic grading system: 4 grade system   03/11/2023 -  Chemotherapy   Patient is on Treatment Plan : COLORECTAL Xelox (Capeox)(130/850) q21d       HISTORY OF PRESENTING ILLNESS: DAHJA SPADEA 67 y.o. female is here because of  colon cancer Medical history notable for septic arthritis of the hip, obesity, atypical chest pain, skin cancer, carpal tunnel syndrome, chronic vaginitis, chronic venous and sufficiency, TTP, syncope  January 10, 2023: Colonoscopy-ulcerated 2 cm nonobstructing small mass with heaped up margins found in distal transverse colon.  Mass was partially circumferential (involves less than one third of the lumen circumference) no bleeding present.  10 mm polyp in proximal ascending colon which was sessile.  Pathology-Ascending colon polyp was compatible with a sessile serrated adenoma without cytologic dysplasia Transverse colon polyp-invasive moderately differentiated adenocarcinoma arising within a tubular adenoma with high-grade dysplasia  January 15 2023: [facility] Medical Oncology Consult Patient reports that the colonoscopy was performed for surveillance; however, she was also having episodes of intermittent diarrhea x 2 to 3 months.  Stools were not bloody, nor was she passing mucus but they were soft.  In  association with these episodes she would also feel dizzy and note an odd smell, nausea.   To see Dr. Logan Bores from surgery on January 22 2023   Social:  Married.  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  Tobacco quit 24 years.  Rare glass of wine  [facility] Mother alive 65 epileptic, dementia Father died 82 Alzheimers, prostate cancer Brother alive 23 arthritis requiring joint replacement,   WBC 6.4 hemoglobin 12.5 MCV 88 platelet count 178 normal differential CMP normal CEA 2.9 chromogranin A 564  January 17 2023:  CT CAP Short segment asymmetric wall thickening of the transverse colon measuring 3.3 cm in length, compatible with reported history of  primary colonic neoplasm.  Small adjacent soft tissue nodule/lymph nodes adjacent to the colonic wall thickening measuring 6 mm, nonspecific are suspicious  for local nodal disease. Prominent/mildly enlarged right upper quadrant lymph nodes are similar dating back to January 26, 2019 and favored reactive.  No convincing evidence of distant metastatic disease within the chest, abdomen or pelvis.  Hepatomegaly with nodular hepatic contour, suggestive of cirrhosis Cholelithiasis without findings of acute cholecystitis.   Incidental right thyroid nodule measuring   January 18 2023:  Presented to [facility] ED with headaches and chest discomfort.  CT PA negative for PE.  CT head without contrast negative.    January 22 2023:  Scheduled follow up for colon cancer.  Reviewed results of labs and imaging with patient and husband. Will repeat chromgranin A and refer for PET/CT if chromogranin A still significantly elevated.    January 23, 2023: Chromogranin A 294  February 04 2023:  Transverse colectomy Pathology showed adenocarcinoma invading visceral peritoneum, Grade 3.  All of 30 lymph nodes were negative for tumor.  Surgical Stage (T4a, N0 M0)  MSI intact  Feb 25, 2023:   Reviewed results of surgical pathology with patient and husband.  Per NCCN guidelines adjuvant  options are CAPEOX, FOLFOX 6 months, Capecitabine or observation.  Discussed risks and benefits of each  Mar 07 2023:  Scheduled follow-up regarding colon cancer.  Bifrontal headaches have returned.  These began in October 2023 but resolved after her colon surgery.  Has seen neurology and underwent MRI brain negative for CNS lesion.  EEG normal.   (Raises the question of what medications in perioperative period helped with the HA's.)  Recovering from a dog bite on dorsum of right hand.  Will receive Capecitabine today.    Mar 11 2023:  Port placement.  Cycle 1 FOLFOX   Mar 19 2023:  Neurology follow up.    Review of Systems  Constitutional:  Negative for appetite change, chills, fatigue, fever and unexpected weight change.       Has gained weight due to change in diet  HENT:   Negative for mouth sores, nosebleeds, sore throat, tinnitus and trouble swallowing.   Eyes:  Negative for eye problems and icterus.       Vision changes:  None  Respiratory:  Negative for chest tightness, hemoptysis, shortness of breath and wheezing.        One month of nonproductive cough.  No history of seasonable allergies  Cardiovascular:  Negative for chest pain and leg swelling.       Intermittent palpitations.  Chronic LE edema  Endocrine: Negative for hot flashes.       Cold intolerance:  none Heat intolerance:  none  Genitourinary:  Negative for bladder incontinence, difficulty urinating, dysuria, frequency, hematuria and nocturia.   Musculoskeletal:  Negative for arthralgias, back pain, gait problem, myalgias, neck pain and neck stiffness.  Skin:  Negative for itching, rash and wound.  Neurological:  Negative for dizziness, extremity weakness, gait problem, headaches, numbness, seizures and speech difficulty.  Hematological:  Negative for adenopathy. Bruises/bleeds easily.  Psychiatric/Behavioral:  Negative for sleep disturbance and suicidal ideas. The patient is not nervous/anxious.     MEDICAL  HISTORY: Past Medical History:  Diagnosis Date   Abnormal glucose    Achilles tendinitis of left lower extremity    Acute suppurative arthritis due to bacteria (HCC) 03/09/2011   Overview:  Last Assessment & Plan:  Methicillin sensitive staphylococcal aureus septic hip. She clearly does need I&D of the hip. I will have her continue the doxycycline for now but stop it 7 days prior to her surgery to maximize the yield on the cultures in the operating room though I be shocked if we don't find methicillin sensitive staph aureus again.. I agree with removing as much of the pros   Anal fissure 11/30/2015   Arthritis    right hip   Atypical chest pain 04/09/2016   Bilateral edema of lower extremity    BMI 39.0-39.9,adult    Cancer [facility])    SKIN CANCER   Carpal tunnel syndrome 03/09/2011   Overview:  Last Assessment & Plan:  Clinically this seems to be carpal tunnel syndrome. Giving given her history of infection though we can keep in the back of our mind the idea that she will have a cervical spine problem but I think this is unlikely at this point in time medics carpal tunnel syndrome fits clinically this was a positive Tinel's sign however in for a brace for her.   Chronic vaginitis    Chronic  venous insufficiency    Depression    Dyslipidemia    Fatigue 10/13/2015   Fibromyalgia    GERD without esophagitis    History of immune thrombocytopenia 03/09/2011   History of operative procedure on hip 03/10/2012   History of TTP (thrombotic thrombocytopenic purpura)    Hypokalemia    Lichen sclerosus et atrophicus of the vulva 11/30/2015   Major depressive disorder, recurrent, moderate (HCC)    Methicillin susceptible Staphylococcus aureus infection 03/09/2011   Overview:  Last Assessment & Plan:  This is undoubtedlythe culprit organism   Migraine    Morbid obesity with BMI of 45.0-49.9, adult (HCC) 10/13/2015   Morbidly obese (HCC)    MSSA (methicillin susceptible Staphylococcus aureus)  infection    Near syncope    Osteoarthritis, chronic    Pain due to total hip replacement (HCC) 09/20/2011   Palpitations 03/04/2019   Postmenopausal    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 syndrome (HCC)    20 yrs ago-not seen anyone for 10 yrs.   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 Mild diverticulosis. Small internal hemorrhoids. Healed anal fissure   ENDOVENOUS ABLATION SAPHENOUS VEIN W/ LASER Right 04/02/2019   endovenous laser ablation right greater saphenous vein by Waverly Ferrari [facility]    JOINT REPLACEMENT  08/2009   right hip replacement   REVISION TOTAL HIP ARTHROPLASTY     TONSILLECTOMY  1963   TOTAL HIP REVISION  10/08/2011   Procedure: TOTAL HIP REVISION;  Surgeon: [name];  Location: [facility];  Service: Orthopedics;  Laterality: Right;  Resection of Right Total Hip/Extended Trochanteric Osteotomy/Placement of Antibiotic Total Hip Cemented by Depuy   TOTAL HIP REVISION  03/10/2012   Procedure: TOTAL HIP REVISION;  Surgeon: [name], [facility]issa;  Location: [facility];  Service: Orthopedics;  Laterality: Right;  Reimplantation/Revision of a Right Total Hip and Removal of Cemented Implant    SOCIAL HISTORY: Social History   Socioeconomic History   Marital status: Married    Spouse name: Raiford Noble   Number of children: Not on file   Years of education: 12+7   Highest education level: Bachelor's degree (e.g., BA, AB, BS)  Occupational History   Occupation: Magazine features editor: [employer]  Tobacco Use   Smoking status: Former    Packs/day: 1.50    Years: 20.00    Additional pack years: 0.00    Total pack years: 30.00    Types: Cigarettes    Quit date: 10/04/1990    Years since quitting: 32.4   Smokeless tobacco: Former    Quit  date: 03/09/1991  Vaping Use   Vaping Use: Never used  Substance and Sexual Activity   Alcohol use: Yes    Comment: OCCASIONAL   Drug use: No   Sexual activity: Yes    Partners: Male  Other Topics Concern   Not on file  Social History Narrative   Not on file   Social Determinants of Health   Financial Resource Strain: Not on file  Food Insecurity: Not on file  Transportation Needs: Not on file  Physical Activity: Not on file  Stress: Not on file  Social Connections: Not on file  Intimate Partner Violence: Not on file    FAMILY HISTORY Family History  Problem Relation Age of Onset   Hypertension Mother    Hypertension Father    Alzheimer's disease Father    Prostate cancer Father    Hypertension Maternal Grandfather    Seizures Other    Colon cancer Neg Hx    Stomach cancer Neg Hx    Rectal cancer Neg Hx    Esophageal cancer Neg Hx     ALLERGIES:  has No Known Allergies.  MEDICATIONS:  Current Outpatient Medications  Medication Sig Dispense Refill   buPROPion (WELLBUTRIN XL) 150 MG 24 hr tablet Take 150 mg by mouth every evening.     capecitabine (XELODA) 500 MG tablet Take 4 tablets (2,000 mg total) by mouth 2 (two) times daily after a meal. Take 14 days on, then 7 days off, repeat every 21 days. 112 tablet 7   clobetasol cream (TEMOVATE) 0.05 % Apply 1 Application topically as needed.     divalproex (DEPAKOTE) 250 MG DR tablet Take 250 mg by mouth 2 (two) times daily.     DULoxetine (CYMBALTA) 60 MG capsule Take 60 mg by mouth at bedtime.      ibuprofen (ADVIL) 200 MG tablet Take 200 mg by mouth every 6 (six) hours as needed.     meloxicam (MOBIC) 7.5 MG tablet Take 7.5 mg by mouth every morning.     Multiple Vitamin (MULTIVITAMIN WITH MINERALS) TABS tablet Take 1 tablet by mouth daily.     nortriptyline (PAMELOR) 10 MG capsule Take 10 mg by mouth.     SUMAtriptan (IMITREX) 25 MG tablet Take 25 mg by mouth.     Current Facility-Administered Medications   Medication Dose Route Frequency Provider Last Rate Last Admin   triamcinolone acetonide (KENALOG-40) injection 20 mg  20 mg Other Once Asencion Islam, DPM        PHYSICAL EXAMINATION:  ECOG PERFORMANCE STATUS: 1 - Symptomatic but completely ambulatory   Vitals:   03/07/23 0922  BP: 135/65  Pulse: 84  Resp: 16  Temp: 98.1 F (36.7 C)  SpO2: 94%    Filed Weights   03/07/23 0922  Weight: 246 lb 3.2 oz (111.7 kg)     Physical Exam Vitals and nursing note reviewed.  Constitutional:      General: She is not in acute distress.    Appearance: Normal appearance. She is obese. She is not ill-appearing, toxic-appearing or diaphoretic.  HENT:     Head: Normocephalic and atraumatic.     Right Ear: External ear normal.     Left Ear: External ear normal.     Nose: Nose normal. No congestion or rhinorrhea.  Eyes:     General: No scleral icterus.    Extraocular Movements: Extraocular movements intact.     Conjunctiva/sclera: Conjunctivae normal.     Pupils: Pupils are equal, round, and reactive to light.  Cardiovascular:     Rate and Rhythm: Normal rate.     Heart sounds: No murmur heard.    No friction rub. No gallop.  Abdominal:     General: Bowel sounds are normal.     Palpations: Abdomen is soft.     Tenderness: There is no abdominal tenderness. There is no guarding or rebound.  Musculoskeletal:        General: No swelling, tenderness or deformity.     Cervical back: Normal range of motion and neck supple. No rigidity or tenderness.  Lymphadenopathy:     Head:     Right side of head: No submental, submandibular, tonsillar, preauricular,  posterior auricular or occipital adenopathy.     Left side of head: No submental, submandibular, tonsillar, preauricular, posterior auricular or occipital adenopathy.     Cervical: No cervical adenopathy.     Right cervical: No superficial, deep or posterior cervical adenopathy.    Left cervical: No superficial, deep or posterior  cervical adenopathy.     Upper Body:     Right upper body: No supraclavicular, axillary, pectoral or epitrochlear adenopathy.     Left upper body: No supraclavicular, axillary, pectoral or epitrochlear adenopathy.  Skin:    General: Skin is warm.     Coloration: Skin is not jaundiced.  Neurological:     General: No focal deficit present.     Mental Status: She is alert and oriented to person, place, and time. Mental status is at baseline.     Cranial Nerves: No cranial nerve deficit.  Psychiatric:        Mood and Affect: Mood normal.        Behavior: Behavior normal.        Thought Content: Thought content normal.        Judgment: Judgment normal.     LABORATORY DATA: I have personally reviewed the data as listed:  No visits with results within 1 Month(s) from this visit.  Latest known visit with results is:  Appointment on 01/23/2023  Component Date Value Ref Range Status   Chromogranin A (ng/mL) 01/23/2023 294.0 (H)  0.0 - 101.8 ng/mL Final   Comment: (NOTE) Chromogranin A performed by Thermofisher/BRAHMS KRYPTOR methodology Values obtained with different assay methods or kits cannot be used interchangeably. Performed At: [facility] [address] [place], [place] [id number] Jolene Schimke Marissa NW:[id number]     RADIOGRAPHIC STUDIES: I have personally reviewed the radiological images as listed and agree with the findings in the report  No results found.  ASSESSMENT/PLAN  67 y.o. female is here because of  colon cancer.  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 Mass was partially  circumferential (involves < 1/3rd of the lumen circumference). 10 mm polyp in proximal ascending colon which was sessile.  Pathology ulcerated mass- invasive moderately differentiated adenocarcinoma arising within a tubular adenoma with high-grade dysplasia   January 15 2023- CEA 2.9 Chromogranin A 564  January 17 2023- CT CAP  Short segment asymmetric wall thickening of the transverse colon measuring 3.3 cm in length.   Small adjacent soft tissue nodule/lymph nodes adjacent to the colonic wall thickening measuring 6 mm, suspicious  for local nodal disease. Prominent/mildly enlarged right upper quadrant lymph nodes similar to January 26, 2019 and favored reactive.  No distant metastatic disease.  Hepatomegaly with nodular hepatic contour, suggestive of cirrhosis Cholelithiasis without findings of acute cholecystitis.   Incidental right thyroid nodule   January 23 2023- Chromogranin A 294  February 04 2023:  Transverse colectomy.  Pathology showed adenocarcinoma invading visceral peritoneum, Grade 3.  All of 30 lymph nodes negative for tumor.  MSI intact Feb 25 2023- On basis of spontaneous improvement in chromogranin A will hold on Dodatate PET.     Therapeutics-  Feb 25 2023- Reviewed NCCN treatment guidelines with patient and husband.  They are agreeable to proceed with adjuvant CAPOX x 6 months.   Mar 11 2023- Cycle 1 FOLFOX  Chemotherapy Risks:  Discussed the potential side effects of chemotherapy with the patient.  These include but are not limited to:  Fatigue, Hair loss, low blood counts (anemia, thrombocytopenia) that may necessitate transfusion, bleeding, infection, nausea/ vomiting/Appetite changes/ Constipation/ Diarrhea, mucositis, Neuropathy/ neurologic problems, skin and nail changes such as dry skin and color change, Urine and bladder changes and kidney problems, weight changes, mood changes, decreased libido/fertility problems, damage to heart and lungs.  Some of these side effects can be life  threatening, may be permanent and can result in hospitalization and/or death.  In shared decision making patient has agreed to proceed with chemotherapy.  Will refer patient for formal chemotherapy teaching   Diarrhea:             January 15 2023- Not explained by the colon cancer.  No evidence of inflammatory bowel disease by colonoscopy.    Chromogranin A 564  January 23 2023- Repeat Chromogranin A improved  Feb 25 2023- Diarrhea and chromogranin A improved therefore holding on Dotatate PET/CT    Poor venous access:    Mar 11 2023- Port placement   Headaches Mar 07 2023- Improved following surgery likely related to a component of general anesthesia.  To see neurology  Possible cirrhosis  January 17 2023- Nodular liver contour noted on CT CAP    Cancer Staging  Colon cancer [facility]) Staging form: Colon and Rectum, AJCC 8th Edition - Clinical stage from 02/25/2023: Stage IIB (cT4a, cN0, cM0) - Signed by Loni Muse, Marissa on 02/25/2023 Histopathologic type: Adenocarcinoma, NOS Stage prefix: Initial diagnosis Total positive nodes: 0 Total nodes examined: 30 Histologic grade (G): G3 Histologic grading system: 4 grade system    No problem-specific Assessment & Plan notes found for this encounter.    No orders of the defined types were placed in this encounter.   40  minutes was spent in patient care.  This included time spent preparing to see the patient (e.g., review of tests), obtaining and/or reviewing separately obtained history, counseling and educating the patient/family/caregiver, ordering medications, tests, or procedures; documenting clinical information in the electronic or other health record, independently interpreting results and communicating results to the patient/family/caregiver as well as coordination of care.       All questions were answered. The patient knows to call the clinic with any problems, questions or concerns.  This note was electronically signed.     Loni Muse, Marissa  03/07/2023 9:28 AM

## 2023-03-08 ENCOUNTER — Other Ambulatory Visit: Payer: Self-pay | Admitting: Pharmacist

## 2023-03-08 ENCOUNTER — Encounter: Payer: Self-pay | Admitting: Oncology

## 2023-03-08 ENCOUNTER — Telehealth: Payer: Self-pay | Admitting: Oncology

## 2023-03-08 DIAGNOSIS — C184 Malignant neoplasm of transverse colon: Secondary | ICD-10-CM

## 2023-03-08 MED ORDER — PROCHLORPERAZINE MALEATE 10 MG PO TABS
10.0000 mg | ORAL_TABLET | Freq: Four times a day (QID) | ORAL | 1 refills | Status: DC | PRN
Start: 2023-03-08 — End: 2023-06-10

## 2023-03-08 MED ORDER — ONDANSETRON HCL 8 MG PO TABS
8.0000 mg | ORAL_TABLET | Freq: Three times a day (TID) | ORAL | 1 refills | Status: DC | PRN
Start: 2023-03-08 — End: 2023-04-16

## 2023-03-08 MED FILL — Dexamethasone Sodium Phosphate Inj 100 MG/10ML: INTRAMUSCULAR | Qty: 1 | Status: AC

## 2023-03-08 NOTE — Progress Notes (Signed)
..  Pharmacist Chemotherapy Monitoring - Initial Assessment    Anticipated start date: 03/11/23  The following has been reviewed per standard work regarding the patient's treatment regimen: The patient's diagnosis, treatment plan and drug doses, and organ/hematologic function Lab orders and baseline tests specific to treatment regimen  The treatment plan start date, drug sequencing, and pre-medications Prior authorization status  Patient's documented medication list, including drug-drug interaction screen and prescriptions for anti-emetics and supportive care specific to the treatment regimen The drug concentrations, fluid compatibility, administration routes, and timing of the medications to be used The patient's access for treatment and lifetime cumulative dose history, if applicable  The patient's medication allergies and previous infusion related reactions, if applicable   Changes made to treatment plan:  N/A  Follow up needed:  Port to be placed same day as cycle 1   Domenic Schwab, [facility], 03/08/2023  4:02 PM

## 2023-03-08 NOTE — Telephone Encounter (Signed)
03/08/23 Spoke with patient and scheduled next appt 

## 2023-03-08 NOTE — Telephone Encounter (Signed)
Contacted pt to schedule an appt. Unable to reach via phone, voicemail was left.  Message Received: Isaiah Blakes, CMA sent to [facility] Scheduling 2 week follow up with labs

## 2023-03-09 ENCOUNTER — Other Ambulatory Visit: Payer: Self-pay

## 2023-03-11 ENCOUNTER — Inpatient Hospital Stay: Payer: Medicare PPO

## 2023-03-11 VITALS — BP 137/61 | HR 77 | Temp 97.4°F | Resp 16 | Wt 254.0 lb

## 2023-03-11 DIAGNOSIS — Z6839 Body mass index (BMI) 39.0-39.9, adult: Secondary | ICD-10-CM | POA: Diagnosis not present

## 2023-03-11 DIAGNOSIS — C184 Malignant neoplasm of transverse colon: Secondary | ICD-10-CM

## 2023-03-11 DIAGNOSIS — Z452 Encounter for adjustment and management of vascular access device: Secondary | ICD-10-CM | POA: Diagnosis not present

## 2023-03-11 DIAGNOSIS — E669 Obesity, unspecified: Secondary | ICD-10-CM | POA: Diagnosis not present

## 2023-03-11 DIAGNOSIS — Z5111 Encounter for antineoplastic chemotherapy: Secondary | ICD-10-CM | POA: Diagnosis not present

## 2023-03-11 DIAGNOSIS — Z87891 Personal history of nicotine dependence: Secondary | ICD-10-CM | POA: Diagnosis not present

## 2023-03-11 DIAGNOSIS — Z79899 Other long term (current) drug therapy: Secondary | ICD-10-CM | POA: Diagnosis not present

## 2023-03-11 MED ORDER — HEPARIN SOD (PORK) LOCK FLUSH 100 UNIT/ML IV SOLN
500.0000 [IU] | Freq: Once | INTRAVENOUS | Status: AC | PRN
  Administered 2023-03-11: 500 [IU]

## 2023-03-11 MED ORDER — PALONOSETRON HCL INJECTION 0.25 MG/5ML
0.2500 mg | Freq: Once | INTRAVENOUS | Status: AC
  Administered 2023-03-11: 0.25 mg via INTRAVENOUS
  Filled 2023-03-11: qty 5

## 2023-03-11 MED ORDER — SODIUM CHLORIDE 0.9% FLUSH
10.0000 mL | INTRAVENOUS | Status: DC | PRN
  Administered 2023-03-11: 10 mL

## 2023-03-11 MED ORDER — DEXTROSE 5 % IV SOLN: Freq: Once | INTRAVENOUS | Status: AC

## 2023-03-11 MED ORDER — OXALIPLATIN CHEMO INJECTION 100 MG/20ML
130.0000 mg/m2 | Freq: Once | INTRAVENOUS | Status: AC
  Administered 2023-03-11: 300 mg via INTRAVENOUS
  Filled 2023-03-11: qty 60

## 2023-03-11 MED ORDER — SODIUM CHLORIDE 0.9 % IV SOLN
10.0000 mg | Freq: Once | INTRAVENOUS | Status: AC
  Administered 2023-03-11: 10 mg via INTRAVENOUS
  Filled 2023-03-11: qty 10

## 2023-03-11 NOTE — Patient Instructions (Signed)
Oxaliplatin Injection What is this medication? OXALIPLATIN (ox AL i PLA tin) treats colorectal cancer. It works by slowing down the growth of cancer cells. This medicine may be used for other purposes; ask your health care provider or pharmacist if you have questions. COMMON BRAND NAME(S): Eloxatin What should I tell my care team before I take this medication? They need to know if you have any of these conditions: Heart disease History of irregular heartbeat or rhythm Liver disease Low blood cell levels (white cells, red cells, and platelets) Lung or breathing disease, such as asthma Take medications that treat or prevent blood clots Tingling of the fingers, toes, or other nerve disorder An unusual or allergic reaction to oxaliplatin, other medications, foods, dyes, or preservatives If you or your partner are pregnant or trying to get pregnant Breast-feeding How should I use this medication? This medication is injected into a vein. It is given by your care team in a hospital or clinic setting. Talk to your care team about the use of this medication in children. Special care may be needed. Overdosage: If you think you have taken too much of this medicine contact a poison control center or emergency room at once. NOTE: This medicine is only for you. Do not share this medicine with others. What if I miss a dose? Keep appointments for follow-up doses. It is important not to miss a dose. Call your care team if you are unable to keep an appointment. What may interact with this medication? Do not take this medication with any of the following: Cisapride Dronedarone Pimozide Thioridazine This medication may also interact with the following: Aspirin and aspirin-like medications Certain medications that treat or prevent blood clots, such as warfarin, apixaban, dabigatran, and rivaroxaban Cisplatin Cyclosporine Diuretics Medications for infection, such as acyclovir, adefovir, amphotericin B,  bacitracin, cidofovir, foscarnet, ganciclovir, gentamicin, pentamidine, vancomycin NSAIDs, medications for pain and inflammation, such as ibuprofen or naproxen Other medications that cause heart rhythm changes Pamidronate Zoledronic acid This list may not describe all possible interactions. Give your health care provider a list of all the medicines, herbs, non-prescription drugs, or dietary supplements you use. Also tell them if you smoke, drink alcohol, or use illegal drugs. Some items may interact with your medicine. What should I watch for while using this medication? Your condition will be monitored carefully while you are receiving this medication. You may need blood work while taking this medication. This medication may make you feel generally unwell. This is not uncommon as chemotherapy can affect healthy cells as well as cancer cells. Report any side effects. Continue your course of treatment even though you feel ill unless your care team tells you to stop. This medication may increase your risk of getting an infection. Call your care team for advice if you get a fever, chills, sore throat, or other symptoms of a cold or flu. Do not treat yourself. Try to avoid being around people who are sick. Avoid taking medications that contain aspirin, acetaminophen, ibuprofen, naproxen, or ketoprofen unless instructed by your care team. These medications may hide a fever. Be careful brushing or flossing your teeth or using a toothpick because you may get an infection or bleed more easily. If you have any dental work done, tell your dentist you are receiving this medication. This medication can make you more sensitive to cold. Do not drink cold drinks or use ice. Cover exposed skin before coming in contact with cold temperatures or cold objects. When out in cold weather   wear warm clothing and cover your mouth and nose to warm the air that goes into your lungs. Tell your care team if you get sensitive to the  cold. Talk to your care team if you or your partner are pregnant or think either of you might be pregnant. This medication can cause serious birth defects if taken during pregnancy and for 9 months after the last dose. A negative pregnancy test is required before starting this medication. A reliable form of contraception is recommended while taking this medication and for 9 months after the last dose. Talk to your care team about effective forms of contraception. Do not father a child while taking this medication and for 6 months after the last dose. Use a condom while having sex during this time period. Do not breastfeed while taking this medication and for 3 months after the last dose. This medication may cause infertility. Talk to your care team if you are concerned about your fertility. What side effects may I notice from receiving this medication? Side effects that you should report to your care team as soon as possible: Allergic reactions--skin rash, itching, hives, swelling of the face, lips, tongue, or throat Bleeding--bloody or black, tar-like stools, vomiting blood or brown material that looks like coffee grounds, red or dark brown urine, small red or purple spots on skin, unusual bruising or bleeding Dry cough, shortness of breath or trouble breathing Heart rhythm changes--fast or irregular heartbeat, dizziness, feeling faint or lightheaded, chest pain, trouble breathing Infection--fever, chills, cough, sore throat, wounds that don't heal, pain or trouble when passing urine, general feeling of discomfort or being unwell Liver injury--right upper belly pain, loss of appetite, nausea, light-colored stool, dark yellow or brown urine, yellowing skin or eyes, unusual weakness or fatigue Low red blood cell level--unusual weakness or fatigue, dizziness, headache, trouble breathing Muscle injury--unusual weakness or fatigue, muscle pain, dark yellow or brown urine, decrease in amount of urine Pain,  tingling, or numbness in the hands or feet Sudden and severe headache, confusion, change in vision, seizures, which may be signs of posterior reversible encephalopathy syndrome (PRES) Unusual bruising or bleeding Side effects that usually do not require medical attention (report to your care team if they continue or are bothersome): Diarrhea Nausea Pain, redness, or swelling with sores inside the mouth or throat Unusual weakness or fatigue Vomiting This list may not describe all possible side effects. Call your doctor for medical advice about side effects. You may report side effects to FDA at 1-800-FDA-1088. Where should I keep my medication? This medication is given in a hospital or clinic. It will not be stored at home. NOTE: This sheet is a summary. It may not cover all possible information. If you have questions about this medicine, talk to your doctor, pharmacist, or health care provider.  2023 [employer] Standard (2007-11-29 00:00:00)  

## 2023-03-12 ENCOUNTER — Telehealth: Payer: Self-pay

## 2023-03-12 NOTE — Telephone Encounter (Signed)
Pt has called c/o excruciating jaw pain when she tries to eat or drink. Her feet & tongue are cold sensitive as expected. She denies all other side effects.   Darl Pikes Phy,RPH: Is the jaw pain due to eating or drinking something cold? Is the jaw pain one sided?   Keyandre Pileggi,RN: The pain is not just one sided. She said it doesn't matter if its cold, hot or room temp.   Darl Pikes, [id number] neuropathy has an onset within hours to 1 or 2 days, resolves within 2 weeks, and frequently recurs with further dosing. Symptoms include transient paresthesia, dysesthesia, and hypoesthesia in the hands, feet, perioral area, or throat. Jaw spasm, abnormal tongue sensation, dysarthria, eye pain, and a feeling of chest pressure have also been observed. Exposure to cold temperatures or cold objects (ie, ice for mucositis prophylaxis) can precipitate or exacerbate these symptoms. The above mentions jaw spasms so likely the reason. Management includes dose reduction or discontinuation so we probably need to Dr. Angelene Giovanni know.   Mykle Pascua,RN: Dr Angelene Giovanni added to conversation.

## 2023-03-13 ENCOUNTER — Encounter: Payer: Self-pay | Admitting: Oncology

## 2023-03-13 DIAGNOSIS — Z8669 Personal history of other diseases of the nervous system and sense organs: Secondary | ICD-10-CM | POA: Insufficient documentation

## 2023-03-15 NOTE — Progress Notes (Signed)
[facility] Cancer Initial Visit:  Patient Care Team: Hurshel Party, NP as PCP - General (Internal Medicine) Loni Muse, MD as Consulting Physician (Internal Medicine)  CHIEF COMPLAINTS/PURPOSE OF CONSULTATION:  Oncology History  Colon cancer [facility])  01/15/2023 Initial Diagnosis   Colon cancer (HCC)   02/25/2023 Cancer Staging   Staging form: Colon and Rectum, AJCC 8th Edition - Clinical stage from 02/25/2023: Stage IIB (cT4a, cN0, cM0) - Signed by Loni Muse, MD on 02/25/2023 Histopathologic type: Adenocarcinoma, NOS Stage prefix: Initial diagnosis Total positive nodes: 0 Total nodes examined: 30 Histologic grade (G): G3 Histologic grading system: 4 grade system   03/11/2023 -  Chemotherapy   Patient is on Treatment Plan : COLORECTAL Xelox (Capeox)(130/850) q21d       HISTORY OF PRESENTING ILLNESS: [name] 67 y.o. female is here because of  colon cancer Medical history notable for septic arthritis of the hip, obesity, atypical chest pain, skin cancer, carpal tunnel syndrome, chronic vaginitis, chronic venous and sufficiency, TTP, syncope  January 10, 2023: Colonoscopy-ulcerated 2 cm nonobstructing small mass with heaped up margins found in distal transverse colon.  Mass was partially circumferential (involves less than one third of the lumen circumference) no bleeding present.  10 mm polyp in proximal ascending colon which was sessile.  Pathology-Ascending colon polyp was compatible with a sessile serrated adenoma without cytologic dysplasia Transverse colon polyp-invasive moderately differentiated adenocarcinoma arising within a tubular adenoma with high-grade dysplasia  January 15 2023: [facility] Medical Oncology Consult Patient reports that the colonoscopy was performed for surveillance; however, she was also having episodes of intermittent diarrhea x 2 to 3 months.  Stools were not bloody, nor was she passing mucus but they were soft.  In  association with these episodes she would also feel dizzy and note an odd smell, nausea.   To see Dr. Logan Bores from surgery on January 22 2023   Social:  Married.  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  Tobacco quit 24 years.  Rare glass of wine  [facility] Mother alive 33 epileptic, dementia Father died 84 Alzheimers, prostate cancer Brother alive 4 arthritis requiring joint replacement,   WBC 6.4 hemoglobin 12.5 MCV 88 platelet count 178 normal differential CMP normal CEA 2.9 chromogranin A 564  January 17 2023:  CT CAP Short segment asymmetric wall thickening of the transverse colon measuring 3.3 cm in length, compatible with reported history of  primary colonic neoplasm.  Small adjacent soft tissue nodule/lymph nodes adjacent to the colonic wall thickening measuring 6 mm, nonspecific are suspicious  for local nodal disease. Prominent/mildly enlarged right upper quadrant lymph nodes are similar dating back to January 26, 2019 and favored reactive.  No convincing evidence of distant metastatic disease within the chest, abdomen or pelvis.  Hepatomegaly with nodular hepatic contour, suggestive of cirrhosis Cholelithiasis without findings of acute cholecystitis.   Incidental right thyroid nodule measuring   January 18 2023:  Presented to [facility] ED with headaches and chest discomfort.  CT PA negative for PE.  CT head without contrast negative.    January 22 2023:  Scheduled follow up for colon cancer.  Reviewed results of labs and imaging with patient and husband. Will repeat chromgranin A and refer for PET/CT if chromogranin A still significantly elevated.    January 23, 2023: Chromogranin A 294  February 04 2023:  Transverse colectomy Pathology showed adenocarcinoma invading visceral peritoneum, Grade 3.  All of 30 lymph nodes were negative for tumor.  Surgical Stage (T4a, N0 M0)  MSI intact  Feb 25, 2023:   Reviewed results of surgical pathology with patient and husband.  Per NCCN guidelines adjuvant  options are CAPEOX, FOLFOX 6 months, Capecitabine or observation.  Discussed risks and benefits of each  Mar 07 2023:   Bifrontal headaches have returned.  These began in October 2023 but resolved after her colon surgery.  Has seen neurology and underwent MRI brain negative for CNS lesion.  EEG normal.   (Raises the question of what medications in perioperative period helped with the HA's.)  Recovering from a dog bite on dorsum of right hand.  Will receive Capecitabine today.    Mar 11 2023:  Port placement.  Cycle 1 XelOx   Mar 19 2023:  Neurology follow up.    Mar 20 2023:  Scheduled follow-up regarding colon cancer.   Agree with Neurology assessment that neurocognitive testing should not be performed it at all, until well after chemotherapy has been completed.  Experienced some mild nausea helped by antiemetics.   Has occasional HA's.  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   No HFS.     April 01 2023:  Cycle 2 XelOx  Review of Systems  Constitutional:  Negative for appetite change, chills, fatigue, fever and unexpected weight change.       Has gained weight due to change in diet  HENT:   Negative for mouth sores, nosebleeds, sore throat, tinnitus and trouble swallowing.   Eyes:  Negative for eye problems and icterus.       Vision changes:  None  Respiratory:  Negative for chest tightness, hemoptysis, shortness of breath and wheezing.        One month of nonproductive cough.  No history of seasonable allergies  Cardiovascular:  Negative for chest pain and leg swelling.       Intermittent palpitations.  Chronic LE edema  Endocrine: Negative for hot flashes.       Cold intolerance:  none Heat intolerance:  none  Genitourinary:  Negative for bladder incontinence, difficulty urinating, dysuria, frequency, hematuria and nocturia.   Musculoskeletal:  Negative for arthralgias, back pain, gait problem, myalgias, neck pain and neck stiffness.  Skin:   Negative for itching, rash and wound.  Neurological:  Negative for dizziness, extremity weakness, gait problem, headaches, numbness, seizures and speech difficulty.  Hematological:  Negative for adenopathy. Bruises/bleeds easily.  Psychiatric/Behavioral:  Negative for sleep disturbance and suicidal ideas. The patient is not nervous/anxious.     MEDICAL HISTORY: Past Medical History:  Diagnosis Date   Abnormal glucose    Achilles tendinitis of left lower extremity    Acute suppurative arthritis due to bacteria (HCC) 03/09/2011   Overview:  Last Assessment & Plan:  Methicillin sensitive staphylococcal aureus septic hip. She clearly does need I&D of the hip. I will have her continue the doxycycline for now but stop it 7 days prior to her surgery to maximize the yield on the cultures in the operating room though I be shocked if we don't find methicillin sensitive staph aureus again.. I agree with removing as much of the pros   Anal fissure 11/30/2015   Arthritis    right hip   Atypical chest pain 04/09/2016   Bilateral edema of lower extremity    BMI 39.0-39.9,adult    Cancer [facility])    SKIN CANCER   Carpal tunnel syndrome 03/09/2011   Overview:  Last Assessment & Plan:  Clinically this seems to  be carpal tunnel syndrome. Giving given her history of infection though we can keep in the back of our mind the idea that she will have a cervical spine problem but I think this is unlikely at this point in time medics carpal tunnel syndrome fits clinically this was a positive Tinel's sign however in for a brace for her.   Chronic vaginitis    Chronic venous insufficiency    Depression    Dyslipidemia    Fatigue 10/13/2015   Fibromyalgia    GERD without esophagitis    History of immune thrombocytopenia 03/09/2011   History of operative procedure on hip 03/10/2012   History of TTP (thrombotic thrombocytopenic purpura)    Hypokalemia    Lichen sclerosus et atrophicus of the vulva 11/30/2015   Major  depressive disorder, recurrent, moderate (HCC)    Methicillin susceptible Staphylococcus aureus infection 03/09/2011   Overview:  Last Assessment & Plan:  This is undoubtedlythe culprit organism   Migraine    Morbid obesity with BMI of 45.0-49.9, adult (HCC) 10/13/2015   Morbidly obese (HCC)    MSSA (methicillin susceptible Staphylococcus aureus) infection    Near syncope    Osteoarthritis, chronic    Pain due to total hip replacement (HCC) 09/20/2011   Palpitations 03/04/2019   Postmenopausal    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 syndrome (HCC)    20 yrs ago-not seen anyone for 10 yrs.   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 Mild diverticulosis. Small internal hemorrhoids. Healed anal fissure   ENDOVENOUS ABLATION SAPHENOUS VEIN W/ LASER Right 04/02/2019   endovenous laser ablation right greater saphenous vein by Waverly Ferrari MD    JOINT REPLACEMENT  08/2009   right hip replacement   REVISION TOTAL HIP ARTHROPLASTY     TONSILLECTOMY  1963   TOTAL HIP REVISION  10/08/2011   Procedure: TOTAL HIP REVISION;  Surgeon: [name];  Location: [facility];  Service: Orthopedics;  Laterality: Right;  Resection of Right Total Hip/Extended Trochanteric Osteotomy/Placement of Antibiotic Total Hip Cemented by Depuy   TOTAL HIP REVISION  03/10/2012   Procedure: TOTAL HIP REVISION;  Surgeon: [name], MD;  Location: [facility];  Service: Orthopedics;  Laterality: Right;  Reimplantation/Revision of a Right Total Hip and Removal of Cemented Implant    SOCIAL HISTORY: Social History   Socioeconomic History   Marital status: Married    Spouse name: Raiford Noble   Number of children: Not on file   Years of education: 12+7   Highest education level: Bachelor's degree  (e.g., BA, AB, BS)  Occupational History   Occupation: Magazine features editor: [employer]  Tobacco Use   Smoking status: Former    Packs/day: 1.50    Years: 20.00    Additional pack years: 0.00    Total pack years: 30.00    Types: Cigarettes    Quit date: 10/04/1990    Years since quitting: 32.4   Smokeless tobacco: Former    Quit date: 03/09/1991  Vaping Use   Vaping Use: Never used  Substance and Sexual Activity   Alcohol use: Yes    Comment: OCCASIONAL   Drug use: No   Sexual activity: Yes    Partners: Male  Other Topics Concern   Not  on file  Social History Narrative   Not on file   Social Determinants of Health   Financial Resource Strain: Not on file  Food Insecurity: Not on file  Transportation Needs: Not on file  Physical Activity: Not on file  Stress: Not on file  Social Connections: Not on file  Intimate Partner Violence: Not on file    FAMILY HISTORY Family History  Problem Relation Age of Onset   Hypertension Mother    Hypertension Father    Alzheimer's disease Father    Prostate cancer Father    Hypertension Maternal Grandfather    Seizures Other    Colon cancer Neg Hx    Stomach cancer Neg Hx    Rectal cancer Neg Hx    Esophageal cancer Neg Hx     ALLERGIES:  has No Known Allergies.  MEDICATIONS:  Current Outpatient Medications  Medication Sig Dispense Refill   buPROPion (WELLBUTRIN XL) 150 MG 24 hr tablet Take 150 mg by mouth every evening.     capecitabine (XELODA) 500 MG tablet Take 4 tablets (2,000 mg total) by mouth 2 (two) times daily after a meal. Take 14 days on, then 7 days off, repeat every 21 days. 112 tablet 7   clobetasol cream (TEMOVATE) 0.05 % Apply 1 Application topically as needed.     divalproex (DEPAKOTE) 250 MG DR tablet Take 250 mg by mouth 2 (two) times daily.     DULoxetine (CYMBALTA) 60 MG capsule Take 60 mg by mouth at bedtime.      ibuprofen (ADVIL) 200 MG tablet Take 200 mg by mouth every 6 (six) hours as  needed.     meloxicam (MOBIC) 7.5 MG tablet Take 7.5 mg by mouth every morning.     Multiple Vitamin (MULTIVITAMIN WITH MINERALS) TABS tablet Take 1 tablet by mouth daily.     nortriptyline (PAMELOR) 10 MG capsule Take 10 mg by mouth.     ondansetron (ZOFRAN) 8 MG tablet Take 1 tablet (8 mg total) by mouth every 8 (eight) hours as needed for nausea or vomiting. Start on the third day after chemotherapy. 30 tablet 1   oxyCODONE (OXY IR/ROXICODONE) 5 MG immediate release tablet SMARTSIG:5 Milligram(s) By Mouth Every 4 Hours PRN     prochlorperazine (COMPAZINE) 10 MG tablet Take 1 tablet (10 mg total) by mouth every 6 (six) hours as needed for nausea or vomiting. 30 tablet 1   SUMAtriptan (IMITREX) 25 MG tablet Take 25 mg by mouth.     Current Facility-Administered Medications  Medication Dose Route Frequency Provider Last Rate Last Admin   triamcinolone acetonide (KENALOG-40) injection 20 mg  20 mg Other Once Asencion Islam, DPM        PHYSICAL EXAMINATION:  ECOG PERFORMANCE STATUS: 1 - Symptomatic but completely ambulatory   There were no vitals filed for this visit.   There were no vitals filed for this visit.    Physical Exam Vitals and nursing note reviewed.  Constitutional:      General: She is not in acute distress.    Appearance: Normal appearance. She is obese. She is not ill-appearing, toxic-appearing or diaphoretic.  HENT:     Head: Normocephalic and atraumatic.     Right Ear: External ear normal.     Left Ear: External ear normal.     Nose: Nose normal. No congestion or rhinorrhea.  Eyes:     General: No scleral icterus.    Extraocular Movements: Extraocular movements intact.     Conjunctiva/sclera: Conjunctivae  normal.     Pupils: Pupils are equal, round, and reactive to light.  Cardiovascular:     Rate and Rhythm: Normal rate.     Heart sounds: No murmur heard.    No friction rub. No gallop.  Abdominal:     General: Bowel sounds are normal.     Palpations:  Abdomen is soft.     Tenderness: There is no abdominal tenderness. There is no guarding or rebound.  Musculoskeletal:        General: No swelling, tenderness or deformity.     Cervical back: Normal range of motion and neck supple. No rigidity or tenderness.  Lymphadenopathy:     Head:     Right side of head: No submental, submandibular, tonsillar, preauricular, posterior auricular or occipital adenopathy.     Left side of head: No submental, submandibular, tonsillar, preauricular, posterior auricular or occipital adenopathy.     Cervical: No cervical adenopathy.     Right cervical: No superficial, deep or posterior cervical adenopathy.    Left cervical: No superficial, deep or posterior cervical adenopathy.     Upper Body:     Right upper body: No supraclavicular, axillary, pectoral or epitrochlear adenopathy.     Left upper body: No supraclavicular, axillary, pectoral or epitrochlear adenopathy.  Skin:    General: Skin is warm.     Coloration: Skin is not jaundiced.  Neurological:     General: No focal deficit present.     Mental Status: She is alert and oriented to person, place, and time. Mental status is at baseline.     Cranial Nerves: No cranial nerve deficit.  Psychiatric:        Mood and Affect: Mood normal.        Behavior: Behavior normal.        Thought Content: Thought content normal.        Judgment: Judgment normal.     LABORATORY DATA: I have personally reviewed the data as listed:  Appointment on 03/07/2023  Component Date Value Ref Range Status   WBC Count 03/07/2023 5.6  4.0 - 10.5 K/uL Final   RBC 03/07/2023 4.42  3.87 - 5.11 MIL/uL Final   Hemoglobin 03/07/2023 12.5  12.0 - 15.0 g/dL Final   HCT 81/19/1478 39.2  36.0 - 46.0 % Final   MCV 03/07/2023 88.7  80.0 - 100.0 fL Final   MCH 03/07/2023 28.3  26.0 - 34.0 pg Final   MCHC 03/07/2023 31.9  30.0 - 36.0 g/dL Final   RDW 29/56/2130 13.7  11.5 - 15.5 % Final   Platelet Count 03/07/2023 159  150 - 400 K/uL  Final   nRBC 03/07/2023 0.0  0.0 - 0.2 % Final   Neutrophils Relative % 03/07/2023 67  % Final   Neutro Abs 03/07/2023 3.8  1.7 - 7.7 K/uL Final   Lymphocytes Relative 03/07/2023 22  % Final   Lymphs Abs 03/07/2023 1.2  0.7 - 4.0 K/uL Final   Monocytes Relative 03/07/2023 8  % Final   Monocytes Absolute 03/07/2023 0.4  0.1 - 1.0 K/uL Final   Eosinophils Relative 03/07/2023 3  % Final   Eosinophils Absolute 03/07/2023 0.2  0.0 - 0.5 K/uL Final   Basophils Relative 03/07/2023 0  % Final   Basophils Absolute 03/07/2023 0.0  0.0 - 0.1 K/uL Final   Immature Granulocytes 03/07/2023 0  % Final   Abs Immature Granulocytes 03/07/2023 0.01  0.00 - 0.07 K/uL Final   Performed at [facility],  [address], [place], [place] [address]   Sodium 03/07/2023 140  135 - 145 mmol/L Final   Potassium 03/07/2023 4.2  3.5 - 5.1 mmol/L Final   Chloride 03/07/2023 106  98 - 111 mmol/L Final   CO2 03/07/2023 27  22 - 32 mmol/L Final   Glucose, Bld 03/07/2023 122 (H)  70 - 99 mg/dL Final   Glucose reference range applies only to samples taken after fasting for at least 8 hours.   BUN 03/07/2023 10  8 - 23 mg/dL Final   Creatinine 60/45/4098 0.77  0.44 - 1.00 mg/dL Final   Calcium 11/91/4782 9.4  8.9 - 10.3 mg/dL Final   Total Protein 95/62/1308 7.0  6.5 - 8.1 g/dL Final   Albumin 65/78/4696 4.0  3.5 - 5.0 g/dL Final   AST 29/52/8413 19  15 - 41 U/L Final   ALT 03/07/2023 21  0 - 44 U/L Final   Alkaline Phosphatase 03/07/2023 90  38 - 126 U/L Final   Total Bilirubin 03/07/2023 0.6  0.3 - 1.2 mg/dL Final   GFR, Estimated 03/07/2023 >60  >60 mL/min Final   Comment: (NOTE) Calculated using the CKD-EPI Creatinine Equation (2021)    Anion gap 03/07/2023 7  5 - 15 Final   Performed at [facility], [address], [place], [place] [address]    RADIOGRAPHIC STUDIES: I have personally reviewed the radiological images as listed and agree with the findings in the  report  No results found.  ASSESSMENT/PLAN  67 y.o. female is here because of  colon cancer.  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 Mass was partially circumferential (involves < 1/3rd of the lumen circumference). 10 mm polyp in proximal ascending colon which was sessile.  Pathology ulcerated mass- invasive moderately differentiated adenocarcinoma arising within a tubular adenoma with high-grade dysplasia   January 15 2023- CEA 2.9 Chromogranin A 564  January 17 2023- CT CAP  Short segment asymmetric wall thickening of the transverse colon measuring 3.3 cm in length.   Small adjacent soft tissue nodule/lymph nodes adjacent to the colonic wall thickening measuring 6 mm, suspicious  for local nodal disease. Prominent/mildly enlarged right upper quadrant lymph nodes similar to January 26, 2019 and favored reactive.  No distant metastatic disease.  Hepatomegaly with nodular hepatic contour, suggestive of cirrhosis Cholelithiasis without findings of acute cholecystitis.   Incidental right thyroid nodule   January 23 2023- Chromogranin A 294  February 04 2023:  Transverse colectomy.  Pathology showed adenocarcinoma invading visceral peritoneum, Grade 3.  All of 30 lymph nodes negative for tumor.  MSI intact Feb 25 2023- On basis of spontaneous improvement in chromogranin A will hold on Dodatate PET.     Therapeutics-  Feb 25 2023- Reviewed NCCN treatment guidelines with patient and husband.  They are agreeable to proceed with adjuvant CAPOX x 6 months.   Mar 11 2023- Cycle 1 XelOx Mar 20 2023- Tolerated cycle 1 well with only some mild  cold neuropathy April 01 2023- Cycle 2 XelOx  Diarrhea:             January 15 2023- Not explained by the colon cancer.  No evidence of inflammatory bowel disease by colonoscopy.    Chromogranin A 564  January 23 2023- Repeat Chromogranin A improved  Feb 25 2023- Diarrhea and chromogranin A improved therefore holding on Dotatate PET/CT    Poor venous access:    Mar 11 2023- Port placement   Headaches Mar 07 2023- Improved following surgery likely related to a component of general anesthesia.  To see neurology  Possible cirrhosis  January 17 2023- Nodular liver contour noted on CT CAP    Cancer Staging  Colon cancer [facility]) Staging form: Colon and Rectum, AJCC 8th Edition - Clinical stage from 02/25/2023: Stage IIB (cT4a, cN0, cM0) - Signed by Loni Muse, MD on 02/25/2023 Histopathologic type: Adenocarcinoma, NOS Stage prefix: Initial diagnosis Total positive nodes: 0 Total nodes examined: 30 Histologic grade (G): G3 Histologic grading system: 4 grade system    No problem-specific Assessment & Plan notes found for this encounter.    No orders of the defined types were placed in this encounter.   30  minutes was spent in patient care.  This included time spent preparing to see the patient (e.g., review of tests), obtaining and/or reviewing separately obtained history, counseling and educating the patient/family/caregiver, ordering medications, tests, or procedures; documenting clinical information in the electronic or other health record, independently interpreting results and communicating results to the patient/family/caregiver as well as coordination of care.       All questions were answered. The patient knows to call the clinic with any problems, questions or concerns.  This note was electronically signed.    Loni Muse, MD  03/15/2023 1:05 PM

## 2023-03-19 ENCOUNTER — Other Ambulatory Visit (HOSPITAL_COMMUNITY): Payer: Self-pay

## 2023-03-19 DIAGNOSIS — R419 Unspecified symptoms and signs involving cognitive functions and awareness: Secondary | ICD-10-CM | POA: Diagnosis not present

## 2023-03-19 DIAGNOSIS — R404 Transient alteration of awareness: Secondary | ICD-10-CM | POA: Diagnosis not present

## 2023-03-19 DIAGNOSIS — G43809 Other migraine, not intractable, without status migrainosus: Secondary | ICD-10-CM | POA: Diagnosis not present

## 2023-03-20 ENCOUNTER — Inpatient Hospital Stay: Payer: Medicare PPO | Admitting: Oncology

## 2023-03-20 ENCOUNTER — Inpatient Hospital Stay: Payer: Medicare PPO

## 2023-03-20 VITALS — BP 122/78 | HR 76 | Temp 97.5°F | Resp 16 | Ht 65.9 in | Wt 247.6 lb

## 2023-03-20 DIAGNOSIS — C184 Malignant neoplasm of transverse colon: Secondary | ICD-10-CM | POA: Diagnosis not present

## 2023-03-20 DIAGNOSIS — Z09 Encounter for follow-up examination after completed treatment for conditions other than malignant neoplasm: Secondary | ICD-10-CM | POA: Diagnosis not present

## 2023-03-20 DIAGNOSIS — Z5111 Encounter for antineoplastic chemotherapy: Secondary | ICD-10-CM | POA: Diagnosis not present

## 2023-03-20 DIAGNOSIS — Z79899 Other long term (current) drug therapy: Secondary | ICD-10-CM | POA: Diagnosis not present

## 2023-03-20 LAB — CMP (CANCER CENTER ONLY)
ALT: 19 U/L (ref 0–44)
AST: 23 U/L (ref 15–41)
Albumin: 3.8 g/dL (ref 3.5–5.0)
Alkaline Phosphatase: 68 U/L (ref 38–126)
Anion gap: 7 (ref 5–15)
BUN: 15 mg/dL (ref 8–23)
CO2: 25 mmol/L (ref 22–32)
Calcium: 8.8 mg/dL — ABNORMAL LOW (ref 8.9–10.3)
Chloride: 106 mmol/L (ref 98–111)
Creatinine: 0.68 mg/dL (ref 0.44–1.00)
GFR, Estimated: >60 mL/min (ref >60)
Glucose, Bld: 108 mg/dL — ABNORMAL HIGH (ref 70–99)
Potassium: 3.8 mmol/L (ref 3.5–5.1)
Sodium: 138 mmol/L (ref 135–145)
Total Bilirubin: 0.4 mg/dL (ref 0.3–1.2)
Total Protein: 6.4 g/dL — ABNORMAL LOW (ref 6.5–8.1)

## 2023-03-20 LAB — CBC WITH DIFFERENTIAL (CANCER CENTER ONLY)
Abs Immature Granulocytes: 0.01 10*3/uL (ref 0.00–0.07)
Basophils Absolute: 0 10*3/uL (ref 0.0–0.1)
Basophils Relative: 0 %
Eosinophils Absolute: 0.2 10*3/uL (ref 0.0–0.5)
Eosinophils Relative: 4 %
HCT: 33 % — ABNORMAL LOW (ref 36.0–46.0)
Hemoglobin: 10.8 g/dL — ABNORMAL LOW (ref 12.0–15.0)
Immature Granulocytes: 0 %
Lymphocytes Relative: 22 %
Lymphs Abs: 1.1 10*3/uL (ref 0.7–4.0)
MCH: 29 pg (ref 26.0–34.0)
MCHC: 32.7 g/dL (ref 30.0–36.0)
MCV: 88.5 fL (ref 80.0–100.0)
Monocytes Absolute: 0.7 10*3/uL (ref 0.1–1.0)
Monocytes Relative: 14 %
Neutro Abs: 3.1 10*3/uL (ref 1.7–7.7)
Neutrophils Relative %: 60 %
Platelet Count: 122 10*3/uL — ABNORMAL LOW (ref 150–400)
RBC: 3.73 MIL/uL — ABNORMAL LOW (ref 3.87–5.11)
RDW: 13 % (ref 11.5–15.5)
WBC Count: 5.1 10*3/uL (ref 4.0–10.5)
nRBC: 0 % (ref 0.0–0.2)

## 2023-03-22 ENCOUNTER — Telehealth: Payer: Self-pay

## 2023-03-22 ENCOUNTER — Other Ambulatory Visit: Payer: Self-pay

## 2023-03-22 NOTE — Telephone Encounter (Signed)
I spoke with [name] to see how she tolerated the first cycle of treatment. She states the first 3 days after infusion, I was pretty nauseated, requiring antiemetic 2-3 times per day. No emesis. She also had the cold sensations when touching things that were cold or even walking on cold floor (often felt after receiving Oxaliplatin). She can now tolerate cool things to drink. The nausea is much better now, only uses the antiemetic if needed. She denies skin rashes, mouth sores, fever, chills, constipation and diarrhea. She has noticed taste changes and has 1/2 cm strip of tenderness on outside of her tongue. She takes the Xeloda @ 10a, and 10p w/food. No missed doses. Pt reminded to call us if she develops temp of 100.4 or higher, day or night. She verbalized understanding.

## 2023-03-24 ENCOUNTER — Other Ambulatory Visit: Payer: Self-pay

## 2023-03-25 ENCOUNTER — Other Ambulatory Visit (HOSPITAL_COMMUNITY): Payer: Self-pay

## 2023-03-25 ENCOUNTER — Encounter: Payer: Self-pay | Admitting: Oncology

## 2023-03-25 DIAGNOSIS — Z09 Encounter for follow-up examination after completed treatment for conditions other than malignant neoplasm: Secondary | ICD-10-CM | POA: Insufficient documentation

## 2023-03-28 ENCOUNTER — Other Ambulatory Visit (HOSPITAL_COMMUNITY): Payer: Self-pay

## 2023-03-28 ENCOUNTER — Inpatient Hospital Stay: Payer: Medicare PPO | Attending: Oncology

## 2023-03-28 DIAGNOSIS — Z5111 Encounter for antineoplastic chemotherapy: Secondary | ICD-10-CM | POA: Insufficient documentation

## 2023-03-28 DIAGNOSIS — R932 Abnormal findings on diagnostic imaging of liver and biliary tract: Secondary | ICD-10-CM | POA: Diagnosis not present

## 2023-03-28 DIAGNOSIS — G620 Drug-induced polyneuropathy: Secondary | ICD-10-CM | POA: Diagnosis not present

## 2023-03-28 DIAGNOSIS — Z87891 Personal history of nicotine dependence: Secondary | ICD-10-CM | POA: Insufficient documentation

## 2023-03-28 DIAGNOSIS — Z96641 Presence of right artificial hip joint: Secondary | ICD-10-CM | POA: Insufficient documentation

## 2023-03-28 DIAGNOSIS — R519 Headache, unspecified: Secondary | ICD-10-CM | POA: Diagnosis not present

## 2023-03-28 DIAGNOSIS — C184 Malignant neoplasm of transverse colon: Secondary | ICD-10-CM | POA: Insufficient documentation

## 2023-03-28 DIAGNOSIS — T451X5A Adverse effect of antineoplastic and immunosuppressive drugs, initial encounter: Secondary | ICD-10-CM | POA: Insufficient documentation

## 2023-03-28 LAB — CBC WITH DIFFERENTIAL (CANCER CENTER ONLY)
Abs Immature Granulocytes: 0.02 10*3/uL (ref 0.00–0.07)
Basophils Absolute: 0 10*3/uL (ref 0.0–0.1)
Basophils Relative: 1 %
Eosinophils Absolute: 0.2 10*3/uL (ref 0.0–0.5)
Eosinophils Relative: 3 %
HCT: 35.3 % — ABNORMAL LOW (ref 36.0–46.0)
Hemoglobin: 11.7 g/dL — ABNORMAL LOW (ref 12.0–15.0)
Immature Granulocytes: 0 %
Lymphocytes Relative: 27 %
Lymphs Abs: 1.5 10*3/uL (ref 0.7–4.0)
MCH: 29.8 pg (ref 26.0–34.0)
MCHC: 33.1 g/dL (ref 30.0–36.0)
MCV: 90.1 fL (ref 80.0–100.0)
Monocytes Absolute: 0.8 10*3/uL (ref 0.1–1.0)
Monocytes Relative: 15 %
Neutro Abs: 2.9 10*3/uL (ref 1.7–7.7)
Neutrophils Relative %: 54 %
Platelet Count: 132 10*3/uL — ABNORMAL LOW (ref 150–400)
RBC: 3.92 MIL/uL (ref 3.87–5.11)
RDW: 14.6 % (ref 11.5–15.5)
WBC Count: 5.5 10*3/uL (ref 4.0–10.5)
nRBC: 0 % (ref 0.0–0.2)

## 2023-03-28 LAB — CMP (CANCER CENTER ONLY)
ALT: 18 U/L (ref 0–44)
AST: 22 U/L (ref 15–41)
Albumin: 3.9 g/dL (ref 3.5–5.0)
Alkaline Phosphatase: 76 U/L (ref 38–126)
Anion gap: 6 (ref 5–15)
BUN: 12 mg/dL (ref 8–23)
CO2: 27 mmol/L (ref 22–32)
Calcium: 8.7 mg/dL — ABNORMAL LOW (ref 8.9–10.3)
Chloride: 106 mmol/L (ref 98–111)
Creatinine: 0.77 mg/dL (ref 0.44–1.00)
GFR, Estimated: >60 mL/min (ref >60)
Glucose, Bld: 97 mg/dL (ref 70–99)
Potassium: 4.1 mmol/L (ref 3.5–5.1)
Sodium: 139 mmol/L (ref 135–145)
Total Bilirubin: 0.6 mg/dL (ref 0.3–1.2)
Total Protein: 6.6 g/dL (ref 6.5–8.1)

## 2023-03-29 DIAGNOSIS — R519 Headache, unspecified: Secondary | ICD-10-CM | POA: Diagnosis not present

## 2023-03-29 DIAGNOSIS — C189 Malignant neoplasm of colon, unspecified: Secondary | ICD-10-CM | POA: Diagnosis not present

## 2023-03-29 DIAGNOSIS — Z139 Encounter for screening, unspecified: Secondary | ICD-10-CM | POA: Diagnosis not present

## 2023-03-29 MED FILL — Dexamethasone Sodium Phosphate Inj 100 MG/10ML: INTRAMUSCULAR | Qty: 1 | Status: AC

## 2023-03-29 NOTE — Addendum Note (Signed)
Addended by: Leatha Gilding on: 03/29/2023 12:00 PM   Modules accepted: Orders

## 2023-04-01 ENCOUNTER — Other Ambulatory Visit: Payer: Self-pay | Admitting: Oncology

## 2023-04-01 ENCOUNTER — Inpatient Hospital Stay: Payer: Medicare PPO

## 2023-04-01 VITALS — BP 136/65 | HR 95 | Temp 98.2°F | Resp 20 | Ht 65.9 in | Wt 252.0 lb

## 2023-04-01 DIAGNOSIS — Z87891 Personal history of nicotine dependence: Secondary | ICD-10-CM | POA: Diagnosis not present

## 2023-04-01 DIAGNOSIS — Z96641 Presence of right artificial hip joint: Secondary | ICD-10-CM | POA: Diagnosis not present

## 2023-04-01 DIAGNOSIS — R932 Abnormal findings on diagnostic imaging of liver and biliary tract: Secondary | ICD-10-CM | POA: Diagnosis not present

## 2023-04-01 DIAGNOSIS — C184 Malignant neoplasm of transverse colon: Secondary | ICD-10-CM

## 2023-04-01 DIAGNOSIS — R519 Headache, unspecified: Secondary | ICD-10-CM | POA: Diagnosis not present

## 2023-04-01 DIAGNOSIS — T451X5A Adverse effect of antineoplastic and immunosuppressive drugs, initial encounter: Secondary | ICD-10-CM | POA: Diagnosis not present

## 2023-04-01 DIAGNOSIS — G620 Drug-induced polyneuropathy: Secondary | ICD-10-CM | POA: Diagnosis not present

## 2023-04-01 DIAGNOSIS — Z5111 Encounter for antineoplastic chemotherapy: Secondary | ICD-10-CM | POA: Diagnosis not present

## 2023-04-01 MED ORDER — OXALIPLATIN CHEMO INJECTION 100 MG/20ML
130.0000 mg/m2 | Freq: Once | INTRAVENOUS | Status: AC
  Administered 2023-04-01: 300 mg via INTRAVENOUS
  Filled 2023-04-01: qty 60

## 2023-04-01 MED ORDER — DEXTROSE 5 % IV SOLN: Freq: Once | INTRAVENOUS | Status: AC

## 2023-04-01 MED ORDER — HEPARIN SOD (PORK) LOCK FLUSH 100 UNIT/ML IV SOLN: 500.0000 [IU] | Freq: Once | INTRAVENOUS | Status: DC | PRN

## 2023-04-01 MED ORDER — SODIUM CHLORIDE 0.9% FLUSH: 10.0000 mL | INTRAVENOUS | Status: DC | PRN

## 2023-04-01 MED ORDER — PALONOSETRON HCL INJECTION 0.25 MG/5ML
0.2500 mg | Freq: Once | INTRAVENOUS | Status: AC
  Administered 2023-04-01: 0.25 mg via INTRAVENOUS
  Filled 2023-04-01: qty 5

## 2023-04-01 MED ORDER — SODIUM CHLORIDE 0.9 % IV SOLN
10.0000 mg | Freq: Once | INTRAVENOUS | Status: AC
  Administered 2023-04-01: 10 mg via INTRAVENOUS
  Filled 2023-04-01: qty 10

## 2023-04-01 NOTE — Patient Instructions (Signed)
Oxaliplatin Injection What is this medication? OXALIPLATIN (ox AL i PLA tin) treats colorectal cancer. It works by slowing down the growth of cancer cells. This medicine may be used for other purposes; ask your health care provider or pharmacist if you have questions. COMMON BRAND NAME(S): Eloxatin What should I tell my care team before I take this medication? They need to know if you have any of these conditions: Heart disease History of irregular heartbeat or rhythm Liver disease Low blood cell levels (white cells, red cells, and platelets) Lung or breathing disease, such as asthma Take medications that treat or prevent blood clots Tingling of the fingers, toes, or other nerve disorder An unusual or allergic reaction to oxaliplatin, other medications, foods, dyes, or preservatives If you or your partner are pregnant or trying to get pregnant Breast-feeding How should I use this medication? This medication is injected into a vein. It is given by your care team in a hospital or clinic setting. Talk to your care team about the use of this medication in children. Special care may be needed. Overdosage: If you think you have taken too much of this medicine contact a poison control center or emergency room at once. NOTE: This medicine is only for you. Do not share this medicine with others. What if I miss a dose? Keep appointments for follow-up doses. It is important not to miss a dose. Call your care team if you are unable to keep an appointment. What may interact with this medication? Do not take this medication with any of the following: Cisapride Dronedarone Pimozide Thioridazine This medication may also interact with the following: Aspirin and aspirin-like medications Certain medications that treat or prevent blood clots, such as warfarin, apixaban, dabigatran, and rivaroxaban Cisplatin Cyclosporine Diuretics Medications for infection, such as acyclovir, adefovir, amphotericin B,  bacitracin, cidofovir, foscarnet, ganciclovir, gentamicin, pentamidine, vancomycin NSAIDs, medications for pain and inflammation, such as ibuprofen or naproxen Other medications that cause heart rhythm changes Pamidronate Zoledronic acid This list may not describe all possible interactions. Give your health care provider a list of all the medicines, herbs, non-prescription drugs, or dietary supplements you use. Also tell them if you smoke, drink alcohol, or use illegal drugs. Some items may interact with your medicine. What should I watch for while using this medication? Your condition will be monitored carefully while you are receiving this medication. You may need blood work while taking this medication. This medication may make you feel generally unwell. This is not uncommon as chemotherapy can affect healthy cells as well as cancer cells. Report any side effects. Continue your course of treatment even though you feel ill unless your care team tells you to stop. This medication may increase your risk of getting an infection. Call your care team for advice if you get a fever, chills, sore throat, or other symptoms of a cold or flu. Do not treat yourself. Try to avoid being around people who are sick. Avoid taking medications that contain aspirin, acetaminophen, ibuprofen, naproxen, or ketoprofen unless instructed by your care team. These medications may hide a fever. Be careful brushing or flossing your teeth or using a toothpick because you may get an infection or bleed more easily. If you have any dental work done, tell your dentist you are receiving this medication. This medication can make you more sensitive to cold. Do not drink cold drinks or use ice. Cover exposed skin before coming in contact with cold temperatures or cold objects. When out in cold weather   wear warm clothing and cover your mouth and nose to warm the air that goes into your lungs. Tell your care team if you get sensitive to the  cold. Talk to your care team if you or your partner are pregnant or think either of you might be pregnant. This medication can cause serious birth defects if taken during pregnancy and for 9 months after the last dose. A negative pregnancy test is required before starting this medication. A reliable form of contraception is recommended while taking this medication and for 9 months after the last dose. Talk to your care team about effective forms of contraception. Do not father a child while taking this medication and for 6 months after the last dose. Use a condom while having sex during this time period. Do not breastfeed while taking this medication and for 3 months after the last dose. This medication may cause infertility. Talk to your care team if you are concerned about your fertility. What side effects may I notice from receiving this medication? Side effects that you should report to your care team as soon as possible: Allergic reactions--skin rash, itching, hives, swelling of the face, lips, tongue, or throat Bleeding--bloody or black, tar-like stools, vomiting blood or brown material that looks like coffee grounds, red or dark brown urine, small red or purple spots on skin, unusual bruising or bleeding Dry cough, shortness of breath or trouble breathing Heart rhythm changes--fast or irregular heartbeat, dizziness, feeling faint or lightheaded, chest pain, trouble breathing Infection--fever, chills, cough, sore throat, wounds that don't heal, pain or trouble when passing urine, general feeling of discomfort or being unwell Liver injury--right upper belly pain, loss of appetite, nausea, light-colored stool, dark yellow or brown urine, yellowing skin or eyes, unusual weakness or fatigue Low red blood cell level--unusual weakness or fatigue, dizziness, headache, trouble breathing Muscle injury--unusual weakness or fatigue, muscle pain, dark yellow or brown urine, decrease in amount of urine Pain,  tingling, or numbness in the hands or feet Sudden and severe headache, confusion, change in vision, seizures, which may be signs of posterior reversible encephalopathy syndrome (PRES) Unusual bruising or bleeding Side effects that usually do not require medical attention (report to your care team if they continue or are bothersome): Diarrhea Nausea Pain, redness, or swelling with sores inside the mouth or throat Unusual weakness or fatigue Vomiting This list may not describe all possible side effects. Call your doctor for medical advice about side effects. You may report side effects to FDA at 1-800-FDA-1088. Where should I keep my medication? This medication is given in a hospital or clinic. It will not be stored at home. NOTE: This sheet is a summary. It may not cover all possible information. If you have questions about this medicine, talk to your doctor, pharmacist, or health care provider.  2024 [employer] (2022-12-16 00:00:00)

## 2023-04-02 ENCOUNTER — Telehealth: Payer: Self-pay

## 2023-04-02 NOTE — Telephone Encounter (Addendum)
I called pt to see how she is tolerating the oral chemo (Xeloda). She takes it when she gets up in the morning, and then 12 hrs later. No missed doses. No N/V, SOB, chest pain, constipation,diarrhea, fever, & chills. Denies skin changes to hands or feet. Pt does admit to daily headaches, & are intermittent for the most part per pt. She would like to know if she should try tylenol or her migraine medication? "It doesn't feel like the same headache when I have migraine. I have involuntary extreme shaking over half of my body that happens a lot. I can't even hold a cup to drink something without making a mess. I still have really intense but short lived jaw pain. It usually is triggered when I go from sweet foods to maybe sour foods. My tongue is sore and has white spots on it". Denies throat pain, but admits to "serious dry mouth". Pt states that the shakes, and jaw cramping started with treatment.  Please advise.   She started cycle 2 on 03/31/2023

## 2023-04-03 ENCOUNTER — Encounter: Payer: Self-pay | Admitting: Oncology

## 2023-04-03 ENCOUNTER — Other Ambulatory Visit: Payer: Self-pay | Admitting: Hematology and Oncology

## 2023-04-03 ENCOUNTER — Inpatient Hospital Stay: Payer: Medicare PPO

## 2023-04-03 ENCOUNTER — Inpatient Hospital Stay (INDEPENDENT_AMBULATORY_CARE_PROVIDER_SITE_OTHER): Payer: Medicare PPO | Admitting: Hematology and Oncology

## 2023-04-03 ENCOUNTER — Encounter: Payer: Self-pay | Admitting: Hematology and Oncology

## 2023-04-03 VITALS — BP 119/82 | HR 86 | Temp 97.9°F | Resp 18 | Ht 65.9 in | Wt 244.6 lb

## 2023-04-03 DIAGNOSIS — Z09 Encounter for follow-up examination after completed treatment for conditions other than malignant neoplasm: Secondary | ICD-10-CM | POA: Diagnosis not present

## 2023-04-03 DIAGNOSIS — Z96641 Presence of right artificial hip joint: Secondary | ICD-10-CM | POA: Diagnosis not present

## 2023-04-03 DIAGNOSIS — C184 Malignant neoplasm of transverse colon: Secondary | ICD-10-CM | POA: Diagnosis not present

## 2023-04-03 DIAGNOSIS — R932 Abnormal findings on diagnostic imaging of liver and biliary tract: Secondary | ICD-10-CM | POA: Diagnosis not present

## 2023-04-03 DIAGNOSIS — K1230 Oral mucositis (ulcerative), unspecified: Secondary | ICD-10-CM

## 2023-04-03 DIAGNOSIS — Z5111 Encounter for antineoplastic chemotherapy: Secondary | ICD-10-CM | POA: Diagnosis not present

## 2023-04-03 DIAGNOSIS — R519 Headache, unspecified: Secondary | ICD-10-CM | POA: Diagnosis not present

## 2023-04-03 DIAGNOSIS — T451X5A Adverse effect of antineoplastic and immunosuppressive drugs, initial encounter: Secondary | ICD-10-CM | POA: Diagnosis not present

## 2023-04-03 DIAGNOSIS — G620 Drug-induced polyneuropathy: Secondary | ICD-10-CM | POA: Diagnosis not present

## 2023-04-03 DIAGNOSIS — Z87891 Personal history of nicotine dependence: Secondary | ICD-10-CM | POA: Diagnosis not present

## 2023-04-03 DIAGNOSIS — R251 Tremor, unspecified: Secondary | ICD-10-CM | POA: Diagnosis not present

## 2023-04-03 LAB — CMP (CANCER CENTER ONLY)
ALT: 25 U/L (ref 0–44)
AST: 35 U/L (ref 15–41)
Albumin: 3.9 g/dL (ref 3.5–5.0)
Alkaline Phosphatase: 77 U/L (ref 38–126)
Anion gap: 8 (ref 5–15)
BUN: 18 mg/dL (ref 8–23)
CO2: 25 mmol/L (ref 22–32)
Calcium: 8.9 mg/dL (ref 8.9–10.3)
Chloride: 105 mmol/L (ref 98–111)
Creatinine: 0.74 mg/dL (ref 0.44–1.00)
GFR, Estimated: >60 mL/min (ref >60)
Glucose, Bld: 89 mg/dL (ref 70–99)
Potassium: 4.2 mmol/L (ref 3.5–5.1)
Sodium: 138 mmol/L (ref 135–145)
Total Bilirubin: 1.1 mg/dL (ref 0.3–1.2)
Total Protein: 6.6 g/dL (ref 6.5–8.1)

## 2023-04-03 LAB — CBC WITH DIFFERENTIAL (CANCER CENTER ONLY)
Abs Immature Granulocytes: 0.02 10*3/uL (ref 0.00–0.07)
Basophils Absolute: 0 10*3/uL (ref 0.0–0.1)
Basophils Relative: 0 %
Eosinophils Absolute: 0.1 10*3/uL (ref 0.0–0.5)
Eosinophils Relative: 2 %
HCT: 37.8 % (ref 36.0–46.0)
Hemoglobin: 12.2 g/dL (ref 12.0–15.0)
Immature Granulocytes: 0 %
Lymphocytes Relative: 28 %
Lymphs Abs: 1.3 10*3/uL (ref 0.7–4.0)
MCH: 29.5 pg (ref 26.0–34.0)
MCHC: 32.3 g/dL (ref 30.0–36.0)
MCV: 91.5 fL (ref 80.0–100.0)
Monocytes Absolute: 0.6 10*3/uL (ref 0.1–1.0)
Monocytes Relative: 13 %
Neutro Abs: 2.7 10*3/uL (ref 1.7–7.7)
Neutrophils Relative %: 57 %
Platelet Count: 112 10*3/uL — ABNORMAL LOW (ref 150–400)
RBC: 4.13 MIL/uL (ref 3.87–5.11)
RDW: 15.6 % — ABNORMAL HIGH (ref 11.5–15.5)
WBC Count: 4.8 10*3/uL (ref 4.0–10.5)
nRBC: 0 % (ref 0.0–0.2)

## 2023-04-03 NOTE — Progress Notes (Signed)
[facility] Cancer Initial Visit:  Patient Care Team: Hurshel Party, NP as PCP - General (Internal Medicine) Loni Muse, MD as Consulting Physician (Internal Medicine)  CHIEF COMPLAINTS/PURPOSE OF CONSULTATION:  Oncology History  Colon cancer [facility])  01/15/2023 Initial Diagnosis   Colon cancer (HCC)   02/25/2023 Cancer Staging   Staging form: Colon and Rectum, AJCC 8th Edition - Clinical stage from 02/25/2023: Stage IIB (cT4a, cN0, cM0) - Signed by Loni Muse, MD on 02/25/2023 Histopathologic type: Adenocarcinoma, NOS Stage prefix: Initial diagnosis Total positive nodes: 0 Total nodes examined: 30 Histologic grade (G): G3 Histologic grading system: 4 grade system   03/11/2023 -  Chemotherapy   Patient is on Treatment Plan : COLORECTAL Xelox (Capeox)(130/850) q21d       HISTORY OF PRESENTING ILLNESS: PENELOPI [name] 67 y.o. female is here because of  colon cancer Medical history notable for septic arthritis of the hip, obesity, atypical chest pain, skin cancer, carpal tunnel syndrome, chronic vaginitis, chronic venous and sufficiency, TTP, syncope  January 10, 2023: Colonoscopy-ulcerated 2 cm nonobstructing small mass with heaped up margins found in distal transverse colon.  Mass was partially circumferential (involves less than one third of the lumen circumference) no bleeding present.  10 mm polyp in proximal ascending colon which was sessile.  Pathology-Ascending colon polyp was compatible with a sessile serrated adenoma without cytologic dysplasia Transverse colon polyp-invasive moderately differentiated adenocarcinoma arising within a tubular adenoma with high-grade dysplasia  January 15 2023: [facility] Medical Oncology Consult Patient reports that the colonoscopy was performed for surveillance; however, she was also having episodes of intermittent diarrhea x 2 to 3 months.  Stools were not bloody, nor was she passing mucus but they were soft.  In  association with these episodes she would also feel dizzy and note an odd smell, nausea.   To see Dr. Logan Bores from surgery on January 22 2023   Social:  Married.  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  Tobacco quit 24 years.  Rare glass of wine  [employer] Mother alive 79 epileptic, dementia Father died 23 Alzheimers, prostate cancer Brother alive 88 arthritis requiring joint replacement,   WBC 6.4 hemoglobin 12.5 MCV 88 platelet count 178 normal differential CMP normal CEA 2.9 chromogranin A 564  January 17 2023:  CT CAP Short segment asymmetric wall thickening of the transverse colon measuring 3.3 cm in length, compatible with reported history of  primary colonic neoplasm.  Small adjacent soft tissue nodule/lymph nodes adjacent to the colonic wall thickening measuring 6 mm, nonspecific are suspicious  for local nodal disease. Prominent/mildly enlarged right upper quadrant lymph nodes are similar dating back to January 26, 2019 and favored reactive.  No convincing evidence of distant metastatic disease within the chest, abdomen or pelvis.  Hepatomegaly with nodular hepatic contour, suggestive of cirrhosis Cholelithiasis without findings of acute cholecystitis.   Incidental right thyroid nodule measuring   January 18 2023:  Presented to [facility] ED with headaches and chest discomfort.  CT PA negative for PE.  CT head without contrast negative.    January 22 2023:  Scheduled follow up for colon cancer.  Reviewed results of labs and imaging with patient and husband. Will repeat chromgranin A and refer for PET/CT if chromogranin A still significantly elevated.    January 23, 2023: Chromogranin A 294  February 04 2023:  Transverse colectomy Pathology showed adenocarcinoma invading visceral peritoneum, Grade 3.  All of 30 lymph nodes were negative for tumor.  Surgical Stage (T4a, N0 M0)  MSI intact  Feb 25, 2023:   Reviewed results of surgical pathology with patient and husband.  Per NCCN guidelines adjuvant  options are CAPEOX, FOLFOX 6 months, Capecitabine or observation.  Discussed risks and benefits of each  Mar 07 2023:   Bifrontal headaches have returned.  These began in October 2023 but resolved after her colon surgery.  Has seen neurology and underwent MRI brain negative for CNS lesion.  EEG normal.   (Raises the question of what medications in perioperative period helped with the HA's.)  Recovering from a dog bite on dorsum of right hand.  Will receive Capecitabine today.    Mar 11 2023:  Port placement.  Cycle 1 XelOx   Mar 19 2023:  Neurology follow up.    Mar 20 2023:  Scheduled follow-up regarding colon cancer.   Agree with Neurology assessment that neurocognitive testing should not be performed it at all, until well after chemotherapy has been completed.  Experienced some mild nausea helped by antiemetics.   Has occasional HA's.  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   No HFS.     April 01 2023:  Cycle 2 XelOx  April 03, 2023: Seen off schedule due to report of intermittent severe jaw pain when tasting sweet or sour, as well as bilateral upper extremity tremor since treatment Monday.  She states she developed bilateral arm tremor with her first cycle, but only lasting a day and not as severe. She also reports severe fatigue and has been sleeping 12+ hours a day. She reports daily headaches, increased from previous.  Review of Systems  Constitutional:  Positive for fatigue. Negative for appetite change, chills, fever and unexpected weight change.  HENT:   Negative for lump/mass, mouth sores, sore throat, trouble swallowing and voice change.   Eyes:  Negative for eye problems.  Respiratory:  Negative for cough and shortness of breath.   Cardiovascular:  Negative for chest pain and leg swelling.  Gastrointestinal:  Positive for nausea. Negative for abdominal pain, constipation, diarrhea and vomiting.  Endocrine: Negative for hot flashes.   Genitourinary:  Negative for difficulty urinating, dysuria, frequency and hematuria.   Musculoskeletal:  Negative for arthralgias, back pain and myalgias.  Skin:  Negative for rash.  Neurological:  Positive for headaches and numbness (Fingers and toes). Negative for dizziness and seizures.  Hematological:  Negative for adenopathy. Does not bruise/bleed easily.  Psychiatric/Behavioral:  Positive for decreased concentration. Negative for depression and sleep disturbance. The patient is nervous/anxious.     MEDICAL HISTORY: Past Medical History:  Diagnosis Date   Abnormal glucose    Achilles tendinitis of left lower extremity    Acute suppurative arthritis due to bacteria (HCC) 03/09/2011   Overview:  Last Assessment & Plan:  Methicillin sensitive staphylococcal aureus septic hip. She clearly does need I&D of the hip. I will have her continue the doxycycline for now but stop it 7 days prior to her surgery to maximize the yield on the cultures in the operating room though I be shocked if we don't find methicillin sensitive staph aureus again.. I agree with removing as much of the pros   Anal fissure 11/30/2015   Arthritis    right hip   Atypical chest pain 04/09/2016   Bilateral edema of lower extremity    BMI 39.0-39.9,adult    Cancer [facility])    SKIN CANCER   Carpal tunnel syndrome 03/09/2011   Overview:  Last  Assessment & Plan:  Clinically this seems to be carpal tunnel syndrome. Giving given her history of infection though we can keep in the back of our mind the idea that she will have a cervical spine problem but I think this is unlikely at this point in time medics carpal tunnel syndrome fits clinically this was a positive Tinel's sign however in for a brace for her.   Chronic vaginitis    Chronic venous insufficiency    Depression    Dyslipidemia    Fatigue 10/13/2015   Fibromyalgia    GERD without esophagitis    History of immune thrombocytopenia 03/09/2011   History of operative  procedure on hip 03/10/2012   History of TTP (thrombotic thrombocytopenic purpura)    Hypokalemia    Lichen sclerosus et atrophicus of the vulva 11/30/2015   Major depressive disorder, recurrent, moderate (HCC)    Methicillin susceptible Staphylococcus aureus infection 03/09/2011   Overview:  Last Assessment & Plan:  This is undoubtedlythe culprit organism   Migraine    Morbid obesity with BMI of 45.0-49.9, adult (HCC) 10/13/2015   Morbidly obese (HCC)    MSSA (methicillin susceptible Staphylococcus aureus) infection    Near syncope    Osteoarthritis, chronic    Pain due to total hip replacement (HCC) 09/20/2011   Palpitations 03/04/2019   Postmenopausal    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 syndrome (HCC)    20 yrs ago-not seen anyone for 10 yrs.   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 Mild diverticulosis. Small internal hemorrhoids. Healed anal fissure   ENDOVENOUS ABLATION SAPHENOUS VEIN W/ LASER Right 04/02/2019   endovenous laser ablation right greater saphenous vein by Waverly Ferrari MD    JOINT REPLACEMENT  08/2009   right hip replacement   REVISION TOTAL HIP ARTHROPLASTY     TONSILLECTOMY  1963   TOTAL HIP REVISION  10/08/2011   Procedure: TOTAL HIP REVISION;  Surgeon: [name];  Location: [facility];  Service: Orthopedics;  Laterality: Right;  Resection of Right Total Hip/Extended Trochanteric Osteotomy/Placement of Antibiotic Total Hip Cemented by Depuy   TOTAL HIP REVISION  03/10/2012   Procedure: TOTAL HIP REVISION;  Surgeon: [name], MD;  Location: [facility];  Service: Orthopedics;  Laterality: Right;  Reimplantation/Revision of a Right Total Hip and Removal of Cemented Implant    SOCIAL HISTORY: Social History    Socioeconomic History   Marital status: Married    Spouse name: Raiford Noble   Number of children: Not on file   Years of education: 12+7   Highest education level: Bachelor's degree (e.g., BA, AB, BS)  Occupational History   Occupation: Magazine features editor: [employer]  Tobacco Use   Smoking status: Former    Packs/day: 1.50    Years: 20.00    Additional pack years: 0.00    Total pack years: 30.00    Types: Cigarettes    Quit date: 10/04/1990    Years since quitting: 32.5   Smokeless tobacco: Former    Quit date: 03/09/1991  Vaping Use   Vaping Use: Never used  Substance and Sexual Activity   Alcohol use: Yes    Comment: OCCASIONAL   Drug use: No   Sexual activity: Yes    Partners:  Male  Other Topics Concern   Not on file  Social History Narrative   Not on file   Social Determinants of Health   Financial Resource Strain: Not on file  Food Insecurity: Not on file  Transportation Needs: Not on file  Physical Activity: Not on file  Stress: Not on file  Social Connections: Not on file  Intimate Partner Violence: Not on file    FAMILY HISTORY Family History  Problem Relation Age of Onset   Hypertension Mother    Hypertension Father    Alzheimer's disease Father    Prostate cancer Father    Hypertension Maternal Grandfather    Seizures Other    Colon cancer Neg Hx    Stomach cancer Neg Hx    Rectal cancer Neg Hx    Esophageal cancer Neg Hx     ALLERGIES:  has No Known Allergies.  MEDICATIONS:  Current Outpatient Medications  Medication Sig Dispense Refill   clotrimazole-betamethasone (LOTRISONE) cream Apply 1 Application topically 2 (two) times daily.     buPROPion (WELLBUTRIN XL) 150 MG 24 hr tablet Take 150 mg by mouth every evening.     capecitabine (XELODA) 500 MG tablet Take 4 tablets (2,000 mg total) by mouth 2 (two) times daily after a meal. Take 14 days on, then 7 days off, repeat every 21 days. 112 tablet 7   clobetasol cream (TEMOVATE) 0.05  % Apply 1 Application topically as needed.     divalproex (DEPAKOTE) 250 MG DR tablet Take 250 mg by mouth 2 (two) times daily.     DULoxetine (CYMBALTA) 60 MG capsule Take 60 mg by mouth at bedtime.      esomeprazole (NEXIUM) 40 MG capsule Take 40 mg by mouth daily. (Patient not taking: Reported on 04/03/2023)     ibuprofen (ADVIL) 200 MG tablet Take 200 mg by mouth every 6 (six) hours as needed.     meloxicam (MOBIC) 7.5 MG tablet Take 7.5 mg by mouth every morning.     Multiple Vitamin (MULTIVITAMIN WITH MINERALS) TABS tablet Take 1 tablet by mouth daily.     nortriptyline (PAMELOR) 10 MG capsule Take 10 mg by mouth.     ondansetron (ZOFRAN) 8 MG tablet Take 1 tablet (8 mg total) by mouth every 8 (eight) hours as needed for nausea or vomiting. Start on the third day after chemotherapy. 30 tablet 1   oxyCODONE (OXY IR/ROXICODONE) 5 MG immediate release tablet SMARTSIG:5 Milligram(s) By Mouth Every 4 Hours PRN     prochlorperazine (COMPAZINE) 10 MG tablet Take 1 tablet (10 mg total) by mouth every 6 (six) hours as needed for nausea or vomiting. 30 tablet 1   SUMAtriptan (IMITREX) 25 MG tablet Take 25 mg by mouth.     Current Facility-Administered Medications  Medication Dose Route Frequency Provider Last Rate Last Admin   triamcinolone acetonide (KENALOG-40) injection 20 mg  20 mg Other Once Asencion Islam, DPM        PHYSICAL EXAMINATION:  ECOG PERFORMANCE STATUS: 1 - Symptomatic but completely ambulatory   Vitals:   04/03/23 1619  BP: 119/82  Pulse: 86  Resp: 18  Temp: 97.9 F (36.6 C)  SpO2: 100%    Filed Weights   04/03/23 1619  Weight: 244 lb 9.6 oz (110.9 kg)     Physical Exam Vitals and nursing note reviewed.  Constitutional:      General: She is not in acute distress.    Appearance: Normal appearance. She is obese. She is not  ill-appearing.  HENT:     Head: Normocephalic and atraumatic.     Right Ear: External ear normal.     Left Ear: External ear normal.      Mouth/Throat:     Mouth: Mucous membranes are moist.     Pharynx: Oropharynx is clear. No oropharyngeal exudate or posterior oropharyngeal erythema.  Eyes:     General: No scleral icterus.    Extraocular Movements: Extraocular movements intact.     Conjunctiva/sclera: Conjunctivae normal.     Pupils: Pupils are equal, round, and reactive to light.  Cardiovascular:     Rate and Rhythm: Normal rate and regular rhythm.  Pulmonary:     Effort: Pulmonary effort is normal.     Breath sounds: Normal breath sounds.  Abdominal:     Palpations: There is no hepatomegaly or splenomegaly.  Musculoskeletal:        General: No swelling, tenderness or deformity.     Cervical back: Normal range of motion and neck supple. No rigidity or tenderness.  Lymphadenopathy:     Head:     Right side of head: No submental, submandibular, tonsillar, preauricular, posterior auricular or occipital adenopathy.     Left side of head: No submental, submandibular, tonsillar, preauricular, posterior auricular or occipital adenopathy.     Cervical: No cervical adenopathy.     Upper Body:     Right upper body: No supraclavicular or axillary adenopathy.     Left upper body: No supraclavicular or axillary adenopathy.     Lower Body: No right inguinal adenopathy. No left inguinal adenopathy.  Skin:    General: Skin is warm.     Coloration: Skin is not jaundiced.     Findings: No rash.  Neurological:     General: No focal deficit present.     Mental Status: She is alert and oriented to person, place, and time. Mental status is at baseline.     Cranial Nerves: Dysarthria present. No cranial nerve deficit or facial asymmetry.     Motor: Tremor (Bilateral upper extremities) present. No weakness.     Gait: Gait is intact.  Psychiatric:        Mood and Affect: Mood normal.        Behavior: Behavior normal.        Thought Content: Thought content normal.        Judgment: Judgment normal.      LABORATORY DATA: I have  personally reviewed the data as listed:  Appointment on 04/03/2023  Component Date Value Ref Range Status   WBC Count 04/03/2023 4.8  4.0 - 10.5 K/uL Final   RBC 04/03/2023 4.13  3.87 - 5.11 MIL/uL Final   Hemoglobin 04/03/2023 12.2  12.0 - 15.0 g/dL Final   HCT 16/07/9603 37.8  36.0 - 46.0 % Final   MCV 04/03/2023 91.5  80.0 - 100.0 fL Final   MCH 04/03/2023 29.5  26.0 - 34.0 pg Final   MCHC 04/03/2023 32.3  30.0 - 36.0 g/dL Final   RDW 54/06/8118 15.6 (H)  11.5 - 15.5 % Final   Platelet Count 04/03/2023 112 (L)  150 - 400 K/uL Final   nRBC 04/03/2023 0.0  0.0 - 0.2 % Final   Neutrophils Relative % 04/03/2023 57  % Final   Neutro Abs 04/03/2023 2.7  1.7 - 7.7 K/uL Final   Lymphocytes Relative 04/03/2023 28  % Final   Lymphs Abs 04/03/2023 1.3  0.7 - 4.0 K/uL Final   Monocytes Relative 04/03/2023 13  %  Final   Monocytes Absolute 04/03/2023 0.6  0.1 - 1.0 K/uL Final   Eosinophils Relative 04/03/2023 2  % Final   Eosinophils Absolute 04/03/2023 0.1  0.0 - 0.5 K/uL Final   Basophils Relative 04/03/2023 0  % Final   Basophils Absolute 04/03/2023 0.0  0.0 - 0.1 K/uL Final   Immature Granulocytes 04/03/2023 0  % Final   Abs Immature Granulocytes 04/03/2023 0.02  0.00 - 0.07 K/uL Final   Performed at [facility], [address], [place], [place] [address]   Sodium 04/03/2023 138  135 - 145 mmol/L Final   Potassium 04/03/2023 4.2  3.5 - 5.1 mmol/L Final   Chloride 04/03/2023 105  98 - 111 mmol/L Final   CO2 04/03/2023 25  22 - 32 mmol/L Final   Glucose, Bld 04/03/2023 89  70 - 99 mg/dL Final   Glucose reference range applies only to samples taken after fasting for at least 8 hours.   BUN 04/03/2023 18  8 - 23 mg/dL Final   Creatinine 60/45/4098 0.74  0.44 - 1.00 mg/dL Final   Calcium 11/91/4782 8.9  8.9 - 10.3 mg/dL Final   Total Protein 95/62/1308 6.6  6.5 - 8.1 g/dL Final   Albumin 65/78/4696 3.9  3.5 - 5.0 g/dL Final   AST 29/52/8413 35  15 - 41 U/L Final   ALT  04/03/2023 25  0 - 44 U/L Final   Alkaline Phosphatase 04/03/2023 77  38 - 126 U/L Final   Total Bilirubin 04/03/2023 1.1  0.3 - 1.2 mg/dL Final   GFR, Estimated 04/03/2023 >60  >60 mL/min Final   Comment: (NOTE) Calculated using the CKD-EPI Creatinine Equation (2021)    Anion gap 04/03/2023 8  5 - 15 Final   Performed at [facility], [address] [place], [place] [address]  Appointment on 03/28/2023  Component Date Value Ref Range Status   WBC Count 03/28/2023 5.5  4.0 - 10.5 K/uL Final   RBC 03/28/2023 3.92  3.87 - 5.11 MIL/uL Final   Hemoglobin 03/28/2023 11.7 (L)  12.0 - 15.0 g/dL Final   HCT 02/72/5366 35.3 (L)  36.0 - 46.0 % Final   MCV 03/28/2023 90.1  80.0 - 100.0 fL Final   MCH 03/28/2023 29.8  26.0 - 34.0 pg Final   MCHC 03/28/2023 33.1  30.0 - 36.0 g/dL Final   RDW 44/12/4740 14.6  11.5 - 15.5 % Final   Platelet Count 03/28/2023 132 (L)  150 - 400 K/uL Final   nRBC 03/28/2023 0.0  0.0 - 0.2 % Final   Neutrophils Relative % 03/28/2023 54  % Final   Neutro Abs 03/28/2023 2.9  1.7 - 7.7 K/uL Final   Lymphocytes Relative 03/28/2023 27  % Final   Lymphs Abs 03/28/2023 1.5  0.7 - 4.0 K/uL Final   Monocytes Relative 03/28/2023 15  % Final   Monocytes Absolute 03/28/2023 0.8  0.1 - 1.0 K/uL Final   Eosinophils Relative 03/28/2023 3  % Final   Eosinophils Absolute 03/28/2023 0.2  0.0 - 0.5 K/uL Final   Basophils Relative 03/28/2023 1  % Final   Basophils Absolute 03/28/2023 0.0  0.0 - 0.1 K/uL Final   Immature Granulocytes 03/28/2023 0  % Final   Abs Immature Granulocytes 03/28/2023 0.02  0.00 - 0.07 K/uL Final   Performed at [facility], [address], [place], [place] [address]   Sodium 03/28/2023 139  135 - 145 mmol/L Final   Potassium 03/28/2023  4.1  3.5 - 5.1 mmol/L Final   Chloride 03/28/2023 106  98 - 111 mmol/L Final   CO2 03/28/2023 27  22 - 32 mmol/L Final   Glucose, Bld 03/28/2023 97  70 - 99 mg/dL Final   Glucose  reference range applies only to samples taken after fasting for at least 8 hours.   BUN 03/28/2023 12  8 - 23 mg/dL Final   Creatinine 16/07/9603 0.77  0.44 - 1.00 mg/dL Final   Calcium 54/06/8118 8.7 (L)  8.9 - 10.3 mg/dL Final   Total Protein 14/78/2956 6.6  6.5 - 8.1 g/dL Final   Albumin 21/30/8657 3.9  3.5 - 5.0 g/dL Final   AST 84/69/6295 22  15 - 41 U/L Final   ALT 03/28/2023 18  0 - 44 U/L Final   Alkaline Phosphatase 03/28/2023 76  38 - 126 U/L Final   Total Bilirubin 03/28/2023 0.6  0.3 - 1.2 mg/dL Final   GFR, Estimated 03/28/2023 >60  >60 mL/min Final   Comment: (NOTE) Calculated using the CKD-EPI Creatinine Equation (2021)    Anion gap 03/28/2023 6  5 - 15 Final   Performed at [facility], [address], [place], [place] [address]  Appointment on 03/20/2023  Component Date Value Ref Range Status   Sodium 03/20/2023 138  135 - 145 mmol/L Final   Potassium 03/20/2023 3.8  3.5 - 5.1 mmol/L Final   Chloride 03/20/2023 106  98 - 111 mmol/L Final   CO2 03/20/2023 25  22 - 32 mmol/L Final   Glucose, Bld 03/20/2023 108 (H)  70 - 99 mg/dL Final   Glucose reference range applies only to samples taken after fasting for at least 8 hours.   BUN 03/20/2023 15  8 - 23 mg/dL Final   Creatinine 24/40/1027 0.68  0.44 - 1.00 mg/dL Final   Calcium 25/36/6440 8.8 (L)  8.9 - 10.3 mg/dL Final   Total Protein 34/74/2595 6.4 (L)  6.5 - 8.1 g/dL Final   Albumin 63/87/5643 3.8  3.5 - 5.0 g/dL Final   AST 32/95/1884 23  15 - 41 U/L Final   ALT 03/20/2023 19  0 - 44 U/L Final   Alkaline Phosphatase 03/20/2023 68  38 - 126 U/L Final   Total Bilirubin 03/20/2023 0.4  0.3 - 1.2 mg/dL Final   GFR, Estimated 03/20/2023 >60  >60 mL/min Final   Comment: (NOTE) Calculated using the CKD-EPI Creatinine Equation (2021)    Anion gap 03/20/2023 7  5 - 15 Final   Performed at [facility], [address], [place], [place] [address]   WBC Count 03/20/2023 5.1  4.0 -  10.5 K/uL Final   RBC 03/20/2023 3.73 (L)  3.87 - 5.11 MIL/uL Final   Hemoglobin 03/20/2023 10.8 (L)  12.0 - 15.0 g/dL Final   HCT 30/16/0109 33.0 (L)  36.0 - 46.0 % Final   MCV 03/20/2023 88.5  80.0 - 100.0 fL Final   MCH 03/20/2023 29.0  26.0 - 34.0 pg Final   MCHC 03/20/2023 32.7  30.0 - 36.0 g/dL Final   RDW 32/35/5732 13.0  11.5 - 15.5 % Final   Platelet Count 03/20/2023 122 (L)  150 - 400 K/uL Final   nRBC 03/20/2023 0.0  0.0 - 0.2 % Final   Neutrophils Relative % 03/20/2023 60  % Final   Neutro Abs 03/20/2023 3.1  1.7 - 7.7 K/uL Final   Lymphocytes Relative 03/20/2023 22  % Final   Lymphs Abs 03/20/2023  1.1  0.7 - 4.0 K/uL Final   Monocytes Relative 03/20/2023 14  % Final   Monocytes Absolute 03/20/2023 0.7  0.1 - 1.0 K/uL Final   Eosinophils Relative 03/20/2023 4  % Final   Eosinophils Absolute 03/20/2023 0.2  0.0 - 0.5 K/uL Final   Basophils Relative 03/20/2023 0  % Final   Basophils Absolute 03/20/2023 0.0  0.0 - 0.1 K/uL Final   Immature Granulocytes 03/20/2023 0  % Final   Abs Immature Granulocytes 03/20/2023 0.01  0.00 - 0.07 K/uL Final   Performed at [facility], [address], [place], [place] [address]  Appointment on 03/07/2023  Component Date Value Ref Range Status   WBC Count 03/07/2023 5.6  4.0 - 10.5 K/uL Final   RBC 03/07/2023 4.42  3.87 - 5.11 MIL/uL Final   Hemoglobin 03/07/2023 12.5  12.0 - 15.0 g/dL Final   HCT 19/14/7829 39.2  36.0 - 46.0 % Final   MCV 03/07/2023 88.7  80.0 - 100.0 fL Final   MCH 03/07/2023 28.3  26.0 - 34.0 pg Final   MCHC 03/07/2023 31.9  30.0 - 36.0 g/dL Final   RDW 56/21/3086 13.7  11.5 - 15.5 % Final   Platelet Count 03/07/2023 159  150 - 400 K/uL Final   nRBC 03/07/2023 0.0  0.0 - 0.2 % Final   Neutrophils Relative % 03/07/2023 67  % Final   Neutro Abs 03/07/2023 3.8  1.7 - 7.7 K/uL Final   Lymphocytes Relative 03/07/2023 22  % Final   Lymphs Abs 03/07/2023 1.2  0.7 - 4.0 K/uL Final   Monocytes Relative  03/07/2023 8  % Final   Monocytes Absolute 03/07/2023 0.4  0.1 - 1.0 K/uL Final   Eosinophils Relative 03/07/2023 3  % Final   Eosinophils Absolute 03/07/2023 0.2  0.0 - 0.5 K/uL Final   Basophils Relative 03/07/2023 0  % Final   Basophils Absolute 03/07/2023 0.0  0.0 - 0.1 K/uL Final   Immature Granulocytes 03/07/2023 0  % Final   Abs Immature Granulocytes 03/07/2023 0.01  0.00 - 0.07 K/uL Final   Performed at [facility], [address] [place], [place] [address]   Sodium 03/07/2023 140  135 - 145 mmol/L Final   Potassium 03/07/2023 4.2  3.5 - 5.1 mmol/L Final   Chloride 03/07/2023 106  98 - 111 mmol/L Final   CO2 03/07/2023 27  22 - 32 mmol/L Final   Glucose, Bld 03/07/2023 122 (H)  70 - 99 mg/dL Final   Glucose reference range applies only to samples taken after fasting for at least 8 hours.   BUN 03/07/2023 10  8 - 23 mg/dL Final   Creatinine 96/29/5284 0.77  0.44 - 1.00 mg/dL Final   Calcium 13/24/4010 9.4  8.9 - 10.3 mg/dL Final   Total Protein 27/25/3664 7.0  6.5 - 8.1 g/dL Final   Albumin 40/34/7425 4.0  3.5 - 5.0 g/dL Final   AST 95/63/8756 19  15 - 41 U/L Final   ALT 03/07/2023 21  0 - 44 U/L Final   Alkaline Phosphatase 03/07/2023 90  38 - 126 U/L Final   Total Bilirubin 03/07/2023 0.6  0.3 - 1.2 mg/dL Final   GFR, Estimated 03/07/2023 >60  >60 mL/min Final   Comment: (NOTE) Calculated using the CKD-EPI Creatinine Equation (2021)    Anion gap 03/07/2023 7  5 - 15 Final   Performed at [facility], [address], [place], [place] [address]  RADIOGRAPHIC STUDIES: I have personally reviewed the radiological images as listed and agree with the findings in the report  No results found.  ASSESSMENT/PLAN  67 y.o. female is here because of  colon cancer.  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 Mass was partially circumferential (involves < 1/3rd of the lumen circumference). 10 mm polyp in proximal ascending colon which was sessile.  Pathology ulcerated mass- invasive moderately differentiated adenocarcinoma arising within a tubular adenoma with high-grade dysplasia   January 15 2023- CEA 2.9 Chromogranin A 564  January 17 2023- CT CAP  Short segment asymmetric wall thickening of the transverse colon measuring 3.3 cm in length.   Small adjacent soft tissue nodule/lymph nodes adjacent to the colonic wall thickening measuring 6 mm, suspicious  for local nodal disease. Prominent/mildly enlarged right upper quadrant lymph nodes similar to January 26, 2019 and favored reactive.  No distant metastatic disease.  Hepatomegaly with nodular hepatic contour, suggestive of cirrhosis Cholelithiasis without findings of acute cholecystitis.   Incidental right thyroid nodule   January 23 2023- Chromogranin A 294  February 04 2023:  Transverse colectomy.  Pathology showed adenocarcinoma invading visceral peritoneum, Grade 3.  All of 30 lymph nodes negative for tumor.  MSI intact Feb 25 2023- On basis of spontaneous improvement in chromogranin A will hold on Dodatate PET.     Therapeutics-  Feb 25 2023- Reviewed [facility] treatment guidelines with patient and husband.  They are agreeable to proceed with adjuvant CAPOX x 6 months.   Mar 11 2023- Cycle 1 XelOx Mar 20 2023- Tolerated cycle 1 well with only some mild cold neuropathy April 01 2023- Cycle 2 XelOx  Diarrhea:             January 15 2023- Not explained by the colon cancer.  No evidence of inflammatory bowel disease by colonoscopy.    Chromogranin A 564  January 23 2023- Repeat Chromogranin A improved  Feb 25 2023- Diarrhea and  chromogranin A improved therefore holding on Dotatate PET/CT    Poor venous access:    Mar 11 2023- Port placement   Headaches Mar 07 2023- Improved following surgery likely related to a component of general anesthesia.  To see neurology.  MRI brain and EEG normal.  Possible cirrhosis  January 17 2023- Nodular liver contour noted on CT CAP  Severe jaw pain/tremor with dysarthria  April 03, 2023: Most likely due to oxaliplatin. Oxaliplatin associated acute neuropathy can show as symptoms due to periperal sensory and motor nerve hyperexcitability. One study showed jaw pain with first bite in 60% of patients and tremors/ myoclonus is 15% of patients. Dysarthria  WIll discuss this reduction with Dr. Angelene Giovanni    Cancer Staging  Colon cancer [facility]) Staging form: Colon and Rectum, AJCC 8th Edition - Clinical stage from 02/25/2023: Stage IIB (cT4a, cN0, cM0) - Signed by Loni Muse, MD on 02/25/2023 Histopathologic type: Adenocarcinoma, NOS Stage prefix: Initial diagnosis Total positive nodes: 0 Total nodes examined: 30 Histologic grade (G): G3 Histologic grading system: 4 grade system    No problem-specific Assessment & Plan notes found for this encounter.    No orders of the defined  types were placed in this encounter.   30  minutes was spent in patient care.  This included time spent preparing to see the patient (e.g., review of tests), obtaining and/or reviewing separately obtained history, counseling and educating the patient/family/caregiver, ordering medications, tests, or procedures; documenting clinical information in the electronic or other health record, independently interpreting results and communicating results to the patient/family/caregiver as well as coordination of care.       All questions were answered. The patient knows to call the clinic with any problems, questions or concerns.  This note was electronically signed.      Adah Perl, PA-C  04/03/2023 8:57  PM

## 2023-04-03 NOTE — Telephone Encounter (Signed)
Kelli Mosher,PA: Did she have these symptoms with her 1st cycle? She has seen neurology recently and MRI brain/EEG were reportedly normal. Capecitabine can cause tremors (which I've never seen) and this seems severe. May need to reach out to neurology. Which jaw? Is it swollen, tender? Based on what she reports, she may have candida. I can see her at 4 today. Labs at 3:30 to make sure no electrolyte abnormalities.   Claudene Gatliff,RN: Yes. The jaw pain is one sided, not swollen, nor tender.

## 2023-04-04 ENCOUNTER — Telehealth: Payer: Self-pay

## 2023-04-04 NOTE — Telephone Encounter (Signed)
Cell phone voice mail full, home number not in service.Spoke with husband he is aware and will let her know . New home number given

## 2023-04-04 NOTE — Telephone Encounter (Signed)
Pt called to report some issues she is having. Pt states, " I have had nausea since last treatment. I'm sleeping all the time. It's almost like I have the flu. I'm trembling". Pt denies fever. She still admits to cold sensitivity to hands. Please advise.

## 2023-04-04 NOTE — Telephone Encounter (Signed)
-----   Message from Adah Perl, PA-C sent at 04/03/2023  8:57 PM EDT ----- Please let her know her labs look good, thnaks

## 2023-04-08 ENCOUNTER — Telehealth: Payer: Self-pay

## 2023-04-08 NOTE — Telephone Encounter (Signed)
Pt taking the Xeloda 500mg  4 tabs po BID after a meal 10a, and 10P). No missed doses. She denies nausea, skin rash, itching, mouth sores, chest pain, cough, changes in skin on hand and feet, and no fevers. She does have heartburn, dry mouth, and diarrhea. I instructed her to purchase Mylanta, Biotene, and Imodium, and gave her directions on how to take. I also sent message to [facility], for other advice/ideas. There has been no change in the jaw pain, & intermittent shaking.  Darl Pikes P,RPH:how long has she been on nexium? Could go up to BID or switch to another PPI. We can try adding pepcid BID and/or Carafate. Still on meloxicam? That may worsen symptoms. She may also need a dose reduction on the xeloda.

## 2023-04-09 ENCOUNTER — Encounter: Payer: Self-pay | Admitting: Oncology

## 2023-04-09 ENCOUNTER — Telehealth: Payer: Self-pay

## 2023-04-09 DIAGNOSIS — F4321 Adjustment disorder with depressed mood: Secondary | ICD-10-CM | POA: Diagnosis not present

## 2023-04-09 NOTE — Telephone Encounter (Signed)
Per Dr. Melvyn Neth:  Resume with this evenings dose.  Do not take this mornings dose to try and catch up.  Patient is aware.

## 2023-04-11 ENCOUNTER — Other Ambulatory Visit (HOSPITAL_COMMUNITY): Payer: Self-pay

## 2023-04-15 ENCOUNTER — Other Ambulatory Visit (HOSPITAL_COMMUNITY): Payer: Self-pay

## 2023-04-16 ENCOUNTER — Encounter: Payer: Self-pay | Admitting: Oncology

## 2023-04-16 NOTE — Progress Notes (Signed)
[facility] Cancer Initial Visit:  Patient Care Team: Hurshel Party, NP as PCP - General (Internal Medicine) Loni Muse, MD as Consulting Physician (Internal Medicine)  CHIEF COMPLAINTS/PURPOSE OF CONSULTATION:  Oncology History  Colon cancer [facility])  01/15/2023 Initial Diagnosis   Colon cancer (HCC)   02/25/2023 Cancer Staging   Staging form: Colon and Rectum, AJCC 8th Edition - Clinical stage from 02/25/2023: Stage IIB (cT4a, cN0, cM0) - Signed by Loni Muse, MD on 02/25/2023 Histopathologic type: Adenocarcinoma, NOS Stage prefix: Initial diagnosis Total positive nodes: 0 Total nodes examined: 30 Histologic grade (G): G3 Histologic grading system: 4 grade system   03/11/2023 -  Chemotherapy   Patient is on Treatment Plan : COLORECTAL Xelox (Capeox)(130/850) q21d       HISTORY OF PRESENTING ILLNESS: [name] 67 y.o. female is here because of  colon cancer Medical history notable for septic arthritis of the hip, obesity, atypical chest pain, skin cancer, carpal tunnel syndrome, chronic vaginitis, chronic venous and sufficiency, TTP, syncope  January 10, 2023: Colonoscopy-ulcerated 2 cm nonobstructing small mass with heaped up margins found in distal transverse colon.  Mass was partially circumferential (involves less than one third of the lumen circumference) no bleeding present.  10 mm polyp in proximal ascending colon which was sessile.  Pathology-Ascending colon polyp was compatible with a sessile serrated adenoma without cytologic dysplasia Transverse colon polyp-invasive moderately differentiated adenocarcinoma arising within a tubular adenoma with high-grade dysplasia  January 15 2023: [facility] Medical Oncology Consult Patient reports that the colonoscopy was performed for surveillance; however, she was also having episodes of intermittent diarrhea x 2 to 3 months.  Stools were not bloody, nor was she passing mucus but they were soft.  In  association with these episodes she would also feel dizzy and note an odd smell, nausea.   To see Dr. Logan Bores from surgery on January 22 2023   Social:  Married.  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  Tobacco quit 24 years.  Rare glass of wine  [facility] Mother alive 7 epileptic, dementia Father died 65 Alzheimers, prostate cancer Brother alive 53 arthritis requiring joint replacement,   WBC 6.4 hemoglobin 12.5 MCV 88 platelet count 178 normal differential CMP normal CEA 2.9 chromogranin A 564  January 17 2023:  CT CAP Short segment asymmetric wall thickening of the transverse colon measuring 3.3 cm in length, compatible with reported history of  primary colonic neoplasm.  Small adjacent soft tissue nodule/lymph nodes adjacent to the colonic wall thickening measuring 6 mm, nonspecific are suspicious  for local nodal disease. Prominent/mildly enlarged right upper quadrant lymph nodes are similar dating back to January 26, 2019 and favored reactive.  No convincing evidence of distant metastatic disease within the chest, abdomen or pelvis.  Hepatomegaly with nodular hepatic contour, suggestive of cirrhosis Cholelithiasis without findings of acute cholecystitis.   Incidental right thyroid nodule measuring   January 18 2023:  Presented to [facility] ED with headaches and chest discomfort.  CT PA negative for PE.  CT head without contrast negative.    January 22 2023:  Scheduled follow up for colon cancer.  Reviewed results of labs and imaging with patient and husband. Will repeat chromgranin A and refer for PET/CT if chromogranin A still significantly elevated.    January 23, 2023: Chromogranin A 294  February 04 2023:  Transverse colectomy Pathology showed adenocarcinoma invading visceral peritoneum, Grade 3.  All of 30 lymph nodes were negative for tumor.  Surgical Stage (T4a, N0 M0)  MSI intact  Feb 25, 2023:   Reviewed results of surgical pathology with patient and husband.  Per NCCN guidelines adjuvant  options are CAPEOX, FOLFOX 6 months, Capecitabine or observation.  Discussed risks and benefits of each  Mar 07 2023:   Bifrontal headaches have returned.  These began in October 2023 but resolved after her colon surgery.  Has seen neurology and underwent MRI brain negative for CNS lesion.  EEG normal.   (Raises the question of what medications in perioperative period helped with the HA's.)  Recovering from a dog bite on dorsum of right hand.  Will receive Capecitabine today.    Mar 11 2023:  Port placement.  Cycle 1 XelOx   Mar 19 2023:  Neurology follow up.    Mar 20 2023:   Agree with Neurology assessment that neurocognitive testing should not be performed it at all, until well after chemotherapy has been completed.  Experienced some mild nausea helped by antiemetics.   Has occasional HA's.  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   No HFS.     April 01 2023:  Cycle 2 XelOx  April 17 2023:  Scheduled follow-up regarding colon cancer.  Has lost 4 lbs.  Appetite diminished due to dysgeusia.  No metallic taste.  Edges of tongue get red and sore and painful.  Mouth feels dry despite drinking a lot of water and tea.  Not using anything for her mouth sores.   Fatigue increased.  Has cold neuropathy and cold induced pharyngospasm.      Begin Magic mouthwash for mucositis.     April 22 2023:  Cycle 3 XelOx.    Review of Systems  Constitutional:  Negative for appetite change, chills, fatigue, fever and unexpected weight change.       Has gained weight due to change in diet  HENT:   Negative for mouth sores, nosebleeds, sore throat, tinnitus and trouble swallowing.   Eyes:  Negative for eye problems and icterus.       Vision changes:  None  Respiratory:  Negative for chest tightness, hemoptysis, shortness of breath and wheezing.        One month of nonproductive cough.  No history of seasonable allergies  Cardiovascular:  Negative for chest pain and leg  swelling.       Intermittent palpitations.  Chronic LE edema  Endocrine: Negative for hot flashes.       Cold intolerance:  none Heat intolerance:  none  Genitourinary:  Negative for bladder incontinence, difficulty urinating, dysuria, frequency, hematuria and nocturia.   Musculoskeletal:  Negative for arthralgias, back pain, gait problem, myalgias, neck pain and neck stiffness.  Skin:  Negative for itching, rash and wound.  Neurological:  Negative for dizziness, extremity weakness, gait problem, headaches, numbness, seizures and speech difficulty.  Hematological:  Negative for adenopathy. Bruises/bleeds easily.  Psychiatric/Behavioral:  Negative for sleep disturbance and suicidal ideas. The patient is not nervous/anxious.     MEDICAL HISTORY: Past Medical History:  Diagnosis Date   Abnormal glucose    Achilles tendinitis of left lower extremity    Acute suppurative arthritis due to bacteria (HCC) 03/09/2011   Overview:  Last Assessment & Plan:  Methicillin sensitive staphylococcal aureus septic hip. She clearly does need I&D of the hip. I will have her continue the doxycycline for now but stop it 7 days prior to her surgery to maximize the yield on the cultures in the  operating room though I be shocked if we don't find methicillin sensitive staph aureus again.. I agree with removing as much of the pros   Anal fissure 11/30/2015   Arthritis    right hip   Atypical chest pain 04/09/2016   Bilateral edema of lower extremity    BMI 39.0-39.9,adult    Cancer [facility])    SKIN CANCER   Carpal tunnel syndrome 03/09/2011   Overview:  Last Assessment & Plan:  Clinically this seems to be carpal tunnel syndrome. Giving given her history of infection though we can keep in the back of our mind the idea that she will have a cervical spine problem but I think this is unlikely at this point in time medics carpal tunnel syndrome fits clinically this was a positive Tinel's sign however in for a brace for  her.   Chronic vaginitis    Chronic venous insufficiency    Depression    Dyslipidemia    Fatigue 10/13/2015   Fibromyalgia    GERD without esophagitis    History of immune thrombocytopenia 03/09/2011   History of operative procedure on hip 03/10/2012   History of TTP (thrombotic thrombocytopenic purpura)    Hypokalemia    Lichen sclerosus et atrophicus of the vulva 11/30/2015   Major depressive disorder, recurrent, moderate (HCC)    Methicillin susceptible Staphylococcus aureus infection 03/09/2011   Overview:  Last Assessment & Plan:  This is undoubtedlythe culprit organism   Migraine    Morbid obesity with BMI of 45.0-49.9, adult (HCC) 10/13/2015   Morbidly obese (HCC)    MSSA (methicillin susceptible Staphylococcus aureus) infection    Near syncope    Osteoarthritis, chronic    Pain due to total hip replacement (HCC) 09/20/2011   Palpitations 03/04/2019   Postmenopausal    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 syndrome (HCC)    20 yrs ago-not seen anyone for 10 yrs.   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 Mild diverticulosis. Small internal hemorrhoids. Healed anal fissure   ENDOVENOUS ABLATION SAPHENOUS VEIN W/ LASER Right 04/02/2019   endovenous laser ablation right greater saphenous vein by Waverly Ferrari MD    JOINT REPLACEMENT  08/2009   right hip replacement   REVISION TOTAL HIP ARTHROPLASTY     TONSILLECTOMY  1963   TOTAL HIP REVISION  10/08/2011   Procedure: TOTAL HIP REVISION;  Surgeon: [name];  Location: [facility];  Service: Orthopedics;  Laterality: Right;  Resection of Right Total Hip/Extended Trochanteric Osteotomy/Placement of Antibiotic Total Hip Cemented by Depuy   TOTAL HIP REVISION  03/10/2012   Procedure: TOTAL HIP  REVISION;  Surgeon: [name], MD;  Location: [facility];  Service: Orthopedics;  Laterality: Right;  Reimplantation/Revision of a Right Total Hip and Removal of Cemented Implant    SOCIAL HISTORY: Social History   Socioeconomic History   Marital status: Married    Spouse name: Raiford Noble   Number of children: Not on file   Years of education: 12+7   Highest education level: Bachelor's degree (e.g., BA, AB, BS)  Occupational History   Occupation: Magazine features editor: [employer]  Tobacco Use   Smoking status: Former    Packs/day: 1.50    Years: 20.00    Additional pack  years: 0.00    Total pack years: 30.00    Types: Cigarettes    Quit date: 10/04/1990    Years since quitting: 32.5   Smokeless tobacco: Former    Quit date: 03/09/1991  Vaping Use   Vaping Use: Never used  Substance and Sexual Activity   Alcohol use: Yes    Comment: OCCASIONAL   Drug use: No   Sexual activity: Yes    Partners: Male  Other Topics Concern   Not on file  Social History Narrative   Not on file   Social Determinants of Health   Financial Resource Strain: Not on file  Food Insecurity: Not on file  Transportation Needs: Not on file  Physical Activity: Not on file  Stress: Not on file  Social Connections: Not on file  Intimate Partner Violence: Not on file    FAMILY HISTORY Family History  Problem Relation Age of Onset   Hypertension Mother    Hypertension Father    Alzheimer's disease Father    Prostate cancer Father    Hypertension Maternal Grandfather    Seizures Other    Colon cancer Neg Hx    Stomach cancer Neg Hx    Rectal cancer Neg Hx    Esophageal cancer Neg Hx     ALLERGIES:  has No Known Allergies.  MEDICATIONS:  Current Outpatient Medications  Medication Sig Dispense Refill   aluminum-magnesium hydroxide 200-200 MG/5ML suspension Take 15 mLs by mouth every 6 (six) hours as needed for indigestion.     buPROPion (WELLBUTRIN XL) 150 MG 24 hr tablet Take 150 mg  by mouth every evening.     capecitabine (XELODA) 500 MG tablet Take 4 tablets (2,000 mg total) by mouth 2 (two) times daily after a meal. Take 14 days on, then 7 days off, repeat every 21 days. 112 tablet 7   clobetasol cream (TEMOVATE) 0.05 % Apply 1 Application topically as needed.     clotrimazole-betamethasone (LOTRISONE) cream Apply 1 Application topically 2 (two) times daily.     divalproex (DEPAKOTE) 250 MG DR tablet Take 250 mg by mouth 2 (two) times daily.     DULoxetine (CYMBALTA) 60 MG capsule Take 60 mg by mouth at bedtime.      esomeprazole (NEXIUM) 40 MG capsule Take 40 mg by mouth daily. Pt states has been on for years     ibuprofen (ADVIL) 200 MG tablet Take 200 mg by mouth every 6 (six) hours as needed.     meloxicam (MOBIC) 7.5 MG tablet Take 7.5 mg by mouth every morning.     Multiple Vitamin (MULTIVITAMIN WITH MINERALS) TABS tablet Take 1 tablet by mouth daily.     nortriptyline (PAMELOR) 10 MG capsule Take 10 mg by mouth.     ondansetron (ZOFRAN) 8 MG tablet Take 1 tablet by mouth every 8 hours as needed for nausea or vomiting. Start on the third day after chemotherapy. 30 tablet 1   oxyCODONE (OXY IR/ROXICODONE) 5 MG immediate release tablet SMARTSIG:5 Milligram(s) By Mouth Every 4 Hours PRN     prochlorperazine (COMPAZINE) 10 MG tablet Take 1 tablet (10 mg total) by mouth every 6 (six) hours as needed for nausea or vomiting. 30 tablet 1   SUMAtriptan (IMITREX) 25 MG tablet Take 25 mg by mouth.     Current Facility-Administered Medications  Medication Dose Route Frequency Provider Last Rate Last Admin   triamcinolone acetonide (KENALOG-40) injection 20 mg  20 mg Other Once Asencion Islam, DPM  PHYSICAL EXAMINATION:  ECOG PERFORMANCE STATUS: 1 - Symptomatic but completely ambulatory   There were no vitals filed for this visit.   There were no vitals filed for this visit.    Physical Exam Vitals and nursing note reviewed.  Constitutional:      General:  She is not in acute distress.    Appearance: Normal appearance. She is obese. She is not ill-appearing, toxic-appearing or diaphoretic.  HENT:     Head: Normocephalic and atraumatic.     Right Ear: External ear normal.     Left Ear: External ear normal.     Nose: Nose normal. No congestion or rhinorrhea.  Eyes:     General: No scleral icterus.    Extraocular Movements: Extraocular movements intact.     Conjunctiva/sclera: Conjunctivae normal.     Pupils: Pupils are equal, round, and reactive to light.  Cardiovascular:     Rate and Rhythm: Normal rate.     Heart sounds: No murmur heard.    No friction rub. No gallop.  Abdominal:     General: Bowel sounds are normal.     Palpations: Abdomen is soft.     Tenderness: There is no abdominal tenderness. There is no guarding or rebound.  Musculoskeletal:        General: No swelling, tenderness or deformity.     Cervical back: Normal range of motion and neck supple. No rigidity or tenderness.  Lymphadenopathy:     Head:     Right side of head: No submental, submandibular, tonsillar, preauricular, posterior auricular or occipital adenopathy.     Left side of head: No submental, submandibular, tonsillar, preauricular, posterior auricular or occipital adenopathy.     Cervical: No cervical adenopathy.     Right cervical: No superficial, deep or posterior cervical adenopathy.    Left cervical: No superficial, deep or posterior cervical adenopathy.     Upper Body:     Right upper body: No supraclavicular, axillary, pectoral or epitrochlear adenopathy.     Left upper body: No supraclavicular, axillary, pectoral or epitrochlear adenopathy.  Skin:    General: Skin is warm.     Coloration: Skin is not jaundiced.  Neurological:     General: No focal deficit present.     Mental Status: She is alert and oriented to person, place, and time. Mental status is at baseline.     Cranial Nerves: No cranial nerve deficit.  Psychiatric:        Mood and  Affect: Mood normal.        Behavior: Behavior normal.        Thought Content: Thought content normal.        Judgment: Judgment normal.     LABORATORY DATA: I have personally reviewed the data as listed:  Appointment on 04/03/2023  Component Date Value Ref Range Status   WBC Count 04/03/2023 4.8  4.0 - 10.5 K/uL Final   RBC 04/03/2023 4.13  3.87 - 5.11 MIL/uL Final   Hemoglobin 04/03/2023 12.2  12.0 - 15.0 g/dL Final   HCT 16/07/9603 37.8  36.0 - 46.0 % Final   MCV 04/03/2023 91.5  80.0 - 100.0 fL Final   MCH 04/03/2023 29.5  26.0 - 34.0 pg Final   MCHC 04/03/2023 32.3  30.0 - 36.0 g/dL Final   RDW 54/06/8118 15.6 (H)  11.5 - 15.5 % Final   Platelet Count 04/03/2023 112 (L)  150 - 400 K/uL Final   nRBC 04/03/2023 0.0  0.0 - 0.2 %  Final   Neutrophils Relative % 04/03/2023 57  % Final   Neutro Abs 04/03/2023 2.7  1.7 - 7.7 K/uL Final   Lymphocytes Relative 04/03/2023 28  % Final   Lymphs Abs 04/03/2023 1.3  0.7 - 4.0 K/uL Final   Monocytes Relative 04/03/2023 13  % Final   Monocytes Absolute 04/03/2023 0.6  0.1 - 1.0 K/uL Final   Eosinophils Relative 04/03/2023 2  % Final   Eosinophils Absolute 04/03/2023 0.1  0.0 - 0.5 K/uL Final   Basophils Relative 04/03/2023 0  % Final   Basophils Absolute 04/03/2023 0.0  0.0 - 0.1 K/uL Final   Immature Granulocytes 04/03/2023 0  % Final   Abs Immature Granulocytes 04/03/2023 0.02  0.00 - 0.07 K/uL Final   Performed at [facility], [address], [place], [place] [address]   Sodium 04/03/2023 138  135 - 145 mmol/L Final   Potassium 04/03/2023 4.2  3.5 - 5.1 mmol/L Final   Chloride 04/03/2023 105  98 - 111 mmol/L Final   CO2 04/03/2023 25  22 - 32 mmol/L Final   Glucose, Bld 04/03/2023 89  70 - 99 mg/dL Final   Glucose reference range applies only to samples taken after fasting for at least 8 hours.   BUN 04/03/2023 18  8 - 23 mg/dL Final   Creatinine 19/14/7829 0.74  0.44 - 1.00 mg/dL Final   Calcium 56/21/3086 8.9   8.9 - 10.3 mg/dL Final   Total Protein 57/84/6962 6.6  6.5 - 8.1 g/dL Final   Albumin 95/28/4132 3.9  3.5 - 5.0 g/dL Final   AST 44/10/270 35  15 - 41 U/L Final   ALT 04/03/2023 25  0 - 44 U/L Final   Alkaline Phosphatase 04/03/2023 77  38 - 126 U/L Final   Total Bilirubin 04/03/2023 1.1  0.3 - 1.2 mg/dL Final   GFR, Estimated 04/03/2023 >60  >60 mL/min Final   Comment: (NOTE) Calculated using the CKD-EPI Creatinine Equation (2021)    Anion gap 04/03/2023 8  5 - 15 Final   Performed at [facility], [address] [place], [place] [address]  Appointment on 03/28/2023  Component Date Value Ref Range Status   WBC Count 03/28/2023 5.5  4.0 - 10.5 K/uL Final   RBC 03/28/2023 3.92  3.87 - 5.11 MIL/uL Final   Hemoglobin 03/28/2023 11.7 (L)  12.0 - 15.0 g/dL Final   HCT 40/34/7425 35.3 (L)  36.0 - 46.0 % Final   MCV 03/28/2023 90.1  80.0 - 100.0 fL Final   MCH 03/28/2023 29.8  26.0 - 34.0 pg Final   MCHC 03/28/2023 33.1  30.0 - 36.0 g/dL Final   RDW 95/63/8756 14.6  11.5 - 15.5 % Final   Platelet Count 03/28/2023 132 (L)  150 - 400 K/uL Final   nRBC 03/28/2023 0.0  0.0 - 0.2 % Final   Neutrophils Relative % 03/28/2023 54  % Final   Neutro Abs 03/28/2023 2.9  1.7 - 7.7 K/uL Final   Lymphocytes Relative 03/28/2023 27  % Final   Lymphs Abs 03/28/2023 1.5  0.7 - 4.0 K/uL Final   Monocytes Relative 03/28/2023 15  % Final   Monocytes Absolute 03/28/2023 0.8  0.1 - 1.0 K/uL Final   Eosinophils Relative 03/28/2023 3  % Final   Eosinophils Absolute 03/28/2023 0.2  0.0 - 0.5 K/uL Final   Basophils Relative 03/28/2023 1  % Final   Basophils Absolute 03/28/2023 0.0  0.0 - 0.1 K/uL Final  Immature Granulocytes 03/28/2023 0  % Final   Abs Immature Granulocytes 03/28/2023 0.02  0.00 - 0.07 K/uL Final   Performed at [facility], [address], [place], [place] [address]   Sodium 03/28/2023 139  135 - 145 mmol/L Final   Potassium 03/28/2023 4.1  3.5 - 5.1  mmol/L Final   Chloride 03/28/2023 106  98 - 111 mmol/L Final   CO2 03/28/2023 27  22 - 32 mmol/L Final   Glucose, Bld 03/28/2023 97  70 - 99 mg/dL Final   Glucose reference range applies only to samples taken after fasting for at least 8 hours.   BUN 03/28/2023 12  8 - 23 mg/dL Final   Creatinine 30/86/5784 0.77  0.44 - 1.00 mg/dL Final   Calcium 69/62/9528 8.7 (L)  8.9 - 10.3 mg/dL Final   Total Protein 41/32/4401 6.6  6.5 - 8.1 g/dL Final   Albumin 02/72/5366 3.9  3.5 - 5.0 g/dL Final   AST 44/12/4740 22  15 - 41 U/L Final   ALT 03/28/2023 18  0 - 44 U/L Final   Alkaline Phosphatase 03/28/2023 76  38 - 126 U/L Final   Total Bilirubin 03/28/2023 0.6  0.3 - 1.2 mg/dL Final   GFR, Estimated 03/28/2023 >60  >60 mL/min Final   Comment: (NOTE) Calculated using the CKD-EPI Creatinine Equation (2021)    Anion gap 03/28/2023 6  5 - 15 Final   Performed at [facility], [address], [place], [place] [address]  Appointment on 03/20/2023  Component Date Value Ref Range Status   Sodium 03/20/2023 138  135 - 145 mmol/L Final   Potassium 03/20/2023 3.8  3.5 - 5.1 mmol/L Final   Chloride 03/20/2023 106  98 - 111 mmol/L Final   CO2 03/20/2023 25  22 - 32 mmol/L Final   Glucose, Bld 03/20/2023 108 (H)  70 - 99 mg/dL Final   Glucose reference range applies only to samples taken after fasting for at least 8 hours.   BUN 03/20/2023 15  8 - 23 mg/dL Final   Creatinine 87/56/4332 0.68  0.44 - 1.00 mg/dL Final   Calcium 95/18/8416 8.8 (L)  8.9 - 10.3 mg/dL Final   Total Protein 60/63/0160 6.4 (L)  6.5 - 8.1 g/dL Final   Albumin 10/93/2355 3.8  3.5 - 5.0 g/dL Final   AST 73/22/0254 23  15 - 41 U/L Final   ALT 03/20/2023 19  0 - 44 U/L Final   Alkaline Phosphatase 03/20/2023 68  38 - 126 U/L Final   Total Bilirubin 03/20/2023 0.4  0.3 - 1.2 mg/dL Final   GFR, Estimated 03/20/2023 >60  >60 mL/min Final   Comment: (NOTE) Calculated using the CKD-EPI Creatinine Equation (2021)     Anion gap 03/20/2023 7  5 - 15 Final   Performed at [facility], [address], [place], [place] [address]   WBC Count 03/20/2023 5.1  4.0 - 10.5 K/uL Final   RBC 03/20/2023 3.73 (L)  3.87 - 5.11 MIL/uL Final   Hemoglobin 03/20/2023 10.8 (L)  12.0 - 15.0 g/dL Final   HCT 37/62/8315 33.0 (L)  36.0 - 46.0 % Final   MCV 03/20/2023 88.5  80.0 - 100.0 fL Final   MCH 03/20/2023 29.0  26.0 - 34.0 pg Final   MCHC 03/20/2023 32.7  30.0 - 36.0 g/dL Final   RDW 17/61/6073 13.0  11.5 - 15.5 % Final   Platelet Count 03/20/2023 122 (L)  150 -  400 K/uL Final   nRBC 03/20/2023 0.0  0.0 - 0.2 % Final   Neutrophils Relative % 03/20/2023 60  % Final   Neutro Abs 03/20/2023 3.1  1.7 - 7.7 K/uL Final   Lymphocytes Relative 03/20/2023 22  % Final   Lymphs Abs 03/20/2023 1.1  0.7 - 4.0 K/uL Final   Monocytes Relative 03/20/2023 14  % Final   Monocytes Absolute 03/20/2023 0.7  0.1 - 1.0 K/uL Final   Eosinophils Relative 03/20/2023 4  % Final   Eosinophils Absolute 03/20/2023 0.2  0.0 - 0.5 K/uL Final   Basophils Relative 03/20/2023 0  % Final   Basophils Absolute 03/20/2023 0.0  0.0 - 0.1 K/uL Final   Immature Granulocytes 03/20/2023 0  % Final   Abs Immature Granulocytes 03/20/2023 0.01  0.00 - 0.07 K/uL Final   Performed at [facility], [address] [place], [place] [address]    RADIOGRAPHIC STUDIES: I have personally reviewed the radiological images as listed and agree with the findings in the report  No results found.  ASSESSMENT/PLAN  67 y.o. female is here because of  colon cancer.  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 Mass was partially circumferential (involves < 1/3rd of the lumen circumference). 10 mm polyp in proximal ascending colon which was sessile.  Pathology ulcerated mass- invasive moderately differentiated adenocarcinoma arising within a tubular adenoma with high-grade dysplasia   January 15 2023- CEA 2.9 Chromogranin A 564  January 17 2023- CT CAP  Short segment asymmetric wall thickening of the transverse colon measuring 3.3 cm in length.   Small adjacent soft tissue nodule/lymph nodes adjacent to the colonic wall thickening measuring 6 mm, suspicious  for local nodal disease. Prominent/mildly enlarged right upper quadrant lymph nodes similar to January 26, 2019 and favored reactive.  No distant metastatic disease.  Hepatomegaly with nodular hepatic contour, suggestive of cirrhosis Cholelithiasis without findings of acute cholecystitis.   Incidental right thyroid nodule   January 23 2023- Chromogranin A 294  February 04 2023:  Transverse colectomy.  Pathology showed adenocarcinoma invading visceral peritoneum, Grade 3.  All of 30 lymph nodes negative for tumor.  MSI intact Feb 25 2023- On basis of spontaneous improvement in chromogranin A will hold on Dodatate PET.     Therapeutics-  Feb 25 2023- Reviewed [facility] treatment guidelines with patient and husband.  They are agreeable to proceed with adjuvant CAPOX x 6 months.   Mar 11 2023- Cycle 1 XelOx Mar 20 2023- Tolerated cycle 1 well with only some mild cold neuropathy April 01 2023- Cycle 2 XelOx April 17 2023- Experiencing dysgeusia, mucositis, fatigue, cold neuropathy and cold induced pharyngospasm.  Will begin magic mouthwash and continue XelOx with plans to make changes if symptoms worsen April 22 2023: Cycle 3 XelOx.   Diarrhea:             January 15 2023- Not explained by the colon cancer.  No evidence of inflammatory bowel disease by colonoscopy.    Chromogranin A 564  January 23 2023- Repeat Chromogranin A improved   Feb 25 2023- Diarrhea and chromogranin A improved therefore holding on Dotatate PET/CT  Poor venous access:    Mar 11 2023- Port placement   Headaches Mar 07 2023- Improved following surgery likely related to a component of general anesthesia.  To see neurology  Possible cirrhosis  January 17 2023- Nodular liver contour noted on CT CAP    Cancer Staging  Colon cancer [facility]) Staging form: Colon and Rectum, AJCC 8th Edition - Clinical stage from 02/25/2023: Stage IIB (cT4a, cN0, cM0) - Signed by Loni Muse, MD on 02/25/2023 Histopathologic type: Adenocarcinoma, NOS Stage prefix: Initial diagnosis Total positive nodes: 0 Total nodes examined: 30 Histologic grade (G): G3 Histologic grading system: 4 grade system    No problem-specific Assessment & Plan notes found for this encounter.    No orders of the defined types were placed in this encounter.   30  minutes was spent in patient care.  This included time spent preparing to see the patient (e.g., review of tests), obtaining and/or reviewing separately obtained history, counseling and educating the patient/family/caregiver, ordering medications, tests, or procedures; documenting clinical information in the electronic or other health record, independently interpreting results and communicating results to the patient/family/caregiver as well as coordination of care.       All questions were answered. The patient knows to call the clinic with any problems, questions or concerns.  This note was electronically signed.    Loni Muse, MD  04/16/2023 8:38 AM

## 2023-04-17 ENCOUNTER — Inpatient Hospital Stay: Payer: Medicare PPO

## 2023-04-17 ENCOUNTER — Inpatient Hospital Stay: Payer: Medicare PPO | Admitting: Oncology

## 2023-04-17 VITALS — BP 116/67 | HR 91 | Temp 98.1°F | Resp 18 | Ht 65.9 in | Wt 240.8 lb

## 2023-04-17 DIAGNOSIS — T451X5A Adverse effect of antineoplastic and immunosuppressive drugs, initial encounter: Secondary | ICD-10-CM | POA: Diagnosis not present

## 2023-04-17 DIAGNOSIS — Z09 Encounter for follow-up examination after completed treatment for conditions other than malignant neoplasm: Secondary | ICD-10-CM

## 2023-04-17 DIAGNOSIS — C184 Malignant neoplasm of transverse colon: Secondary | ICD-10-CM | POA: Diagnosis not present

## 2023-04-17 DIAGNOSIS — Z87891 Personal history of nicotine dependence: Secondary | ICD-10-CM | POA: Diagnosis not present

## 2023-04-17 DIAGNOSIS — R519 Headache, unspecified: Secondary | ICD-10-CM | POA: Diagnosis not present

## 2023-04-17 DIAGNOSIS — R932 Abnormal findings on diagnostic imaging of liver and biliary tract: Secondary | ICD-10-CM | POA: Diagnosis not present

## 2023-04-17 DIAGNOSIS — K1231 Oral mucositis (ulcerative) due to antineoplastic therapy: Secondary | ICD-10-CM | POA: Diagnosis not present

## 2023-04-17 DIAGNOSIS — Z5111 Encounter for antineoplastic chemotherapy: Secondary | ICD-10-CM | POA: Diagnosis not present

## 2023-04-17 DIAGNOSIS — Z96641 Presence of right artificial hip joint: Secondary | ICD-10-CM | POA: Diagnosis not present

## 2023-04-17 DIAGNOSIS — G620 Drug-induced polyneuropathy: Secondary | ICD-10-CM | POA: Diagnosis not present

## 2023-04-17 LAB — CBC WITH DIFFERENTIAL/PLATELET
Abs Immature Granulocytes: 0.01 10*3/uL (ref 0.00–0.07)
Basophils Absolute: 0 10*3/uL (ref 0.0–0.1)
Basophils Relative: 1 %
Eosinophils Absolute: 0.2 10*3/uL (ref 0.0–0.5)
Eosinophils Relative: 4 %
HCT: 32.8 % — ABNORMAL LOW (ref 36.0–46.0)
Hemoglobin: 10.7 g/dL — ABNORMAL LOW (ref 12.0–15.0)
Immature Granulocytes: 0 %
Lymphocytes Relative: 24 %
Lymphs Abs: 1.1 10*3/uL (ref 0.7–4.0)
MCH: 30.2 pg (ref 26.0–34.0)
MCHC: 32.6 g/dL (ref 30.0–36.0)
MCV: 92.7 fL (ref 80.0–100.0)
Monocytes Absolute: 0.7 10*3/uL (ref 0.1–1.0)
Monocytes Relative: 15 %
Neutro Abs: 2.8 10*3/uL (ref 1.7–7.7)
Neutrophils Relative %: 56 %
Platelets: 94 10*3/uL — ABNORMAL LOW (ref 150–400)
RBC: 3.54 MIL/uL — ABNORMAL LOW (ref 3.87–5.11)
RDW: 16.3 % — ABNORMAL HIGH (ref 11.5–15.5)
WBC: 4.9 10*3/uL (ref 4.0–10.5)
nRBC: 0 % (ref 0.0–0.2)

## 2023-04-17 LAB — COMPREHENSIVE METABOLIC PANEL
ALT: 16 U/L (ref 0–44)
AST: 22 U/L (ref 15–41)
Albumin: 3.9 g/dL (ref 3.5–5.0)
Alkaline Phosphatase: 65 U/L (ref 38–126)
Anion gap: 9 (ref 5–15)
BUN: 12 mg/dL (ref 8–23)
CO2: 27 mmol/L (ref 22–32)
Calcium: 9.3 mg/dL (ref 8.9–10.3)
Chloride: 105 mmol/L (ref 98–111)
Creatinine, Ser: 0.9 mg/dL (ref 0.44–1.00)
GFR, Estimated: >60 mL/min (ref >60)
Glucose, Bld: 105 mg/dL — ABNORMAL HIGH (ref 70–99)
Potassium: 4.4 mmol/L (ref 3.5–5.1)
Sodium: 141 mmol/L (ref 135–145)
Total Bilirubin: 0.6 mg/dL (ref 0.3–1.2)
Total Protein: 6.2 g/dL — ABNORMAL LOW (ref 6.5–8.1)

## 2023-04-17 MED ORDER — AMBULATORY NON FORMULARY MEDICATION: 0 refills | Status: DC

## 2023-04-18 ENCOUNTER — Other Ambulatory Visit: Payer: Self-pay

## 2023-04-19 ENCOUNTER — Encounter: Payer: Self-pay | Admitting: Oncology

## 2023-04-19 MED FILL — Dexamethasone Sodium Phosphate Inj 100 MG/10ML: INTRAMUSCULAR | Qty: 1 | Status: AC

## 2023-04-22 ENCOUNTER — Other Ambulatory Visit: Payer: Self-pay | Admitting: Pharmacist

## 2023-04-22 ENCOUNTER — Inpatient Hospital Stay: Payer: Medicare PPO | Attending: Oncology

## 2023-04-22 VITALS — BP 122/71 | HR 92 | Temp 97.4°F | Resp 20 | Ht 65.9 in | Wt 244.0 lb

## 2023-04-22 DIAGNOSIS — R519 Headache, unspecified: Secondary | ICD-10-CM | POA: Insufficient documentation

## 2023-04-22 DIAGNOSIS — R197 Diarrhea, unspecified: Secondary | ICD-10-CM | POA: Insufficient documentation

## 2023-04-22 DIAGNOSIS — Z5111 Encounter for antineoplastic chemotherapy: Secondary | ICD-10-CM | POA: Diagnosis not present

## 2023-04-22 DIAGNOSIS — Z79899 Other long term (current) drug therapy: Secondary | ICD-10-CM | POA: Diagnosis not present

## 2023-04-22 DIAGNOSIS — C184 Malignant neoplasm of transverse colon: Secondary | ICD-10-CM | POA: Diagnosis not present

## 2023-04-22 MED ORDER — SODIUM CHLORIDE 0.9% FLUSH
10.0000 mL | INTRAVENOUS | Status: DC | PRN
  Administered 2023-04-22: 10 mL

## 2023-04-22 MED ORDER — DEXTROSE 5 % IV SOLN: Freq: Once | INTRAVENOUS | Status: AC

## 2023-04-22 MED ORDER — HEPARIN SOD (PORK) LOCK FLUSH 100 UNIT/ML IV SOLN
500.0000 [IU] | Freq: Once | INTRAVENOUS | Status: AC | PRN
  Administered 2023-04-22: 500 [IU]

## 2023-04-22 MED ORDER — SODIUM CHLORIDE 0.9 % IV SOLN
10.0000 mg | Freq: Once | INTRAVENOUS | Status: AC
  Administered 2023-04-22: 10 mg via INTRAVENOUS
  Filled 2023-04-22: qty 10

## 2023-04-22 MED ORDER — LIDOCAINE VISCOUS HCL 2 % MT SOLN
5.0000 mL | ORAL | 0 refills | Status: DC | PRN
Start: 2023-04-22 — End: 2023-06-10

## 2023-04-22 MED ORDER — PALONOSETRON HCL INJECTION 0.25 MG/5ML
0.2500 mg | Freq: Once | INTRAVENOUS | Status: AC
  Administered 2023-04-22: 0.25 mg via INTRAVENOUS
  Filled 2023-04-22: qty 5

## 2023-04-22 MED ORDER — OXALIPLATIN CHEMO INJECTION 50 MG/10ML
117.0000 mg/m2 | Freq: Once | INTRAVENOUS | Status: AC
  Administered 2023-04-22: 250 mg via INTRAVENOUS
  Filled 2023-04-22: qty 40

## 2023-04-22 NOTE — Patient Instructions (Signed)
Oxaliplatin Injection What is this medication? OXALIPLATIN (ox AL i PLA tin) treats colorectal cancer. It works by slowing down the growth of cancer cells. This medicine may be used for other purposes; ask your health care provider or pharmacist if you have questions. COMMON BRAND NAME(S): Eloxatin What should I tell my care team before I take this medication? They need to know if you have any of these conditions: Heart disease History of irregular heartbeat or rhythm Liver disease Low blood cell levels (white cells, red cells, and platelets) Lung or breathing disease, such as asthma Take medications that treat or prevent blood clots Tingling of the fingers, toes, or other nerve disorder An unusual or allergic reaction to oxaliplatin, other medications, foods, dyes, or preservatives If you or your partner are pregnant or trying to get pregnant Breast-feeding How should I use this medication? This medication is injected into a vein. It is given by your care team in a hospital or clinic setting. Talk to your care team about the use of this medication in children. Special care may be needed. Overdosage: If you think you have taken too much of this medicine contact a poison control center or emergency room at once. NOTE: This medicine is only for you. Do not share this medicine with others. What if I miss a dose? Keep appointments for follow-up doses. It is important not to miss a dose. Call your care team if you are unable to keep an appointment. What may interact with this medication? Do not take this medication with any of the following: Cisapride Dronedarone Pimozide Thioridazine This medication may also interact with the following: Aspirin and aspirin-like medications Certain medications that treat or prevent blood clots, such as warfarin, apixaban, dabigatran, and rivaroxaban Cisplatin Cyclosporine Diuretics Medications for infection, such as acyclovir, adefovir, amphotericin B,  bacitracin, cidofovir, foscarnet, ganciclovir, gentamicin, pentamidine, vancomycin NSAIDs, medications for pain and inflammation, such as ibuprofen or naproxen Other medications that cause heart rhythm changes Pamidronate Zoledronic acid This list may not describe all possible interactions. Give your health care provider a list of all the medicines, herbs, non-prescription drugs, or dietary supplements you use. Also tell them if you smoke, drink alcohol, or use illegal drugs. Some items may interact with your medicine. What should I watch for while using this medication? Your condition will be monitored carefully while you are receiving this medication. You may need blood work while taking this medication. This medication may make you feel generally unwell. This is not uncommon as chemotherapy can affect healthy cells as well as cancer cells. Report any side effects. Continue your course of treatment even though you feel ill unless your care team tells you to stop. This medication may increase your risk of getting an infection. Call your care team for advice if you get a fever, chills, sore throat, or other symptoms of a cold or flu. Do not treat yourself. Try to avoid being around people who are sick. Avoid taking medications that contain aspirin, acetaminophen, ibuprofen, naproxen, or ketoprofen unless instructed by your care team. These medications may hide a fever. Be careful brushing or flossing your teeth or using a toothpick because you may get an infection or bleed more easily. If you have any dental work done, tell your dentist you are receiving this medication. This medication can make you more sensitive to cold. Do not drink cold drinks or use ice. Cover exposed skin before coming in contact with cold temperatures or cold objects. When out in cold weather   wear warm clothing and cover your mouth and nose to warm the air that goes into your lungs. Tell your care team if you get sensitive to the  cold. Talk to your care team if you or your partner are pregnant or think either of you might be pregnant. This medication can cause serious birth defects if taken during pregnancy and for 9 months after the last dose. A negative pregnancy test is required before starting this medication. A reliable form of contraception is recommended while taking this medication and for 9 months after the last dose. Talk to your care team about effective forms of contraception. Do not father a child while taking this medication and for 6 months after the last dose. Use a condom while having sex during this time period. Do not breastfeed while taking this medication and for 3 months after the last dose. This medication may cause infertility. Talk to your care team if you are concerned about your fertility. What side effects may I notice from receiving this medication? Side effects that you should report to your care team as soon as possible: Allergic reactions--skin rash, itching, hives, swelling of the face, lips, tongue, or throat Bleeding--bloody or black, tar-like stools, vomiting blood or brown material that looks like coffee grounds, red or dark brown urine, small red or purple spots on skin, unusual bruising or bleeding Dry cough, shortness of breath or trouble breathing Heart rhythm changes--fast or irregular heartbeat, dizziness, feeling faint or lightheaded, chest pain, trouble breathing Infection--fever, chills, cough, sore throat, wounds that don't heal, pain or trouble when passing urine, general feeling of discomfort or being unwell Liver injury--right upper belly pain, loss of appetite, nausea, light-colored stool, dark yellow or brown urine, yellowing skin or eyes, unusual weakness or fatigue Low red blood cell level--unusual weakness or fatigue, dizziness, headache, trouble breathing Muscle injury--unusual weakness or fatigue, muscle pain, dark yellow or brown urine, decrease in amount of urine Pain,  tingling, or numbness in the hands or feet Sudden and severe headache, confusion, change in vision, seizures, which may be signs of posterior reversible encephalopathy syndrome (PRES) Unusual bruising or bleeding Side effects that usually do not require medical attention (report to your care team if they continue or are bothersome): Diarrhea Nausea Pain, redness, or swelling with sores inside the mouth or throat Unusual weakness or fatigue Vomiting This list may not describe all possible side effects. Call your doctor for medical advice about side effects. You may report side effects to FDA at 1-800-FDA-1088. Where should I keep my medication? This medication is given in a hospital or clinic. It will not be stored at home. NOTE: This sheet is a summary. It may not cover all possible information. If you have questions about this medicine, talk to your doctor, pharmacist, or health care provider.  2024 [employer]/Gold Standard (2022-12-16 00:00:00)  

## 2023-04-29 ENCOUNTER — Telehealth: Payer: Self-pay

## 2023-04-29 NOTE — Telephone Encounter (Signed)
I called pt to see how she is doing on the Xeloda. She is taking the Xeloda @ 10a, and 10p. No missed doses. She has NO appetite. When she gets food near her mouth, she gets very nauseous. No metallic taste. The outside of her tongue is sore, but no mouth sores. She states she has lost 22 pounds in the last 7 days. The MMW hasn't really helped. She reports diarrhea 2-3 times per day, with use of 2-3 Imodium caps per day. I re-educated her on how to take the Imodium up to 8 tabs/day, as well as alternating the nausea medications. I encouraged she keep a log of the medications. She has tried Ensure chocolate, but not really crazy over it. She reports SOB on exertion. 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 She denies fever, chills, skin rash, and itching.

## 2023-04-30 ENCOUNTER — Inpatient Hospital Stay: Payer: Medicare PPO | Admitting: Oncology

## 2023-04-30 ENCOUNTER — Inpatient Hospital Stay: Payer: Medicare PPO

## 2023-04-30 ENCOUNTER — Encounter: Payer: Self-pay | Admitting: Oncology

## 2023-04-30 VITALS — BP 122/71 | HR 79 | Temp 97.4°F | Resp 18

## 2023-04-30 VITALS — BP 108/75 | HR 86 | Temp 97.7°F | Resp 18 | Ht 65.9 in | Wt 227.6 lb

## 2023-04-30 DIAGNOSIS — R110 Nausea: Secondary | ICD-10-CM

## 2023-04-30 DIAGNOSIS — K521 Toxic gastroenteritis and colitis: Secondary | ICD-10-CM

## 2023-04-30 DIAGNOSIS — C184 Malignant neoplasm of transverse colon: Secondary | ICD-10-CM

## 2023-04-30 DIAGNOSIS — K1231 Oral mucositis (ulcerative) due to antineoplastic therapy: Secondary | ICD-10-CM

## 2023-04-30 DIAGNOSIS — Z79899 Other long term (current) drug therapy: Secondary | ICD-10-CM | POA: Diagnosis not present

## 2023-04-30 DIAGNOSIS — T451X5A Adverse effect of antineoplastic and immunosuppressive drugs, initial encounter: Secondary | ICD-10-CM

## 2023-04-30 DIAGNOSIS — Z09 Encounter for follow-up examination after completed treatment for conditions other than malignant neoplasm: Secondary | ICD-10-CM | POA: Diagnosis not present

## 2023-04-30 DIAGNOSIS — Z5111 Encounter for antineoplastic chemotherapy: Secondary | ICD-10-CM | POA: Diagnosis not present

## 2023-04-30 DIAGNOSIS — R519 Headache, unspecified: Secondary | ICD-10-CM | POA: Diagnosis not present

## 2023-04-30 DIAGNOSIS — R197 Diarrhea, unspecified: Secondary | ICD-10-CM | POA: Diagnosis not present

## 2023-04-30 LAB — COMPREHENSIVE METABOLIC PANEL
ALT: 21 U/L (ref 0–44)
AST: 32 U/L (ref 15–41)
Albumin: 4.1 g/dL (ref 3.5–5.0)
Alkaline Phosphatase: 63 U/L (ref 38–126)
Anion gap: 11 (ref 5–15)
BUN: 19 mg/dL (ref 8–23)
CO2: 23 mmol/L (ref 22–32)
Calcium: 9.1 mg/dL (ref 8.9–10.3)
Chloride: 104 mmol/L (ref 98–111)
Creatinine, Ser: 0.92 mg/dL (ref 0.44–1.00)
GFR, Estimated: >60 mL/min (ref >60)
Glucose, Bld: 103 mg/dL — ABNORMAL HIGH (ref 70–99)
Potassium: 3.4 mmol/L — ABNORMAL LOW (ref 3.5–5.1)
Sodium: 138 mmol/L (ref 135–145)
Total Bilirubin: 1.1 mg/dL (ref 0.3–1.2)
Total Protein: 6.9 g/dL (ref 6.5–8.1)

## 2023-04-30 LAB — CBC WITH DIFFERENTIAL/PLATELET
Abs Immature Granulocytes: 0.02 10*3/uL (ref 0.00–0.07)
Basophils Absolute: 0 10*3/uL (ref 0.0–0.1)
Basophils Relative: 0 %
Eosinophils Absolute: 0.2 10*3/uL (ref 0.0–0.5)
Eosinophils Relative: 4 %
HCT: 34.1 % — ABNORMAL LOW (ref 36.0–46.0)
Hemoglobin: 11.6 g/dL — ABNORMAL LOW (ref 12.0–15.0)
Immature Granulocytes: 0 %
Lymphocytes Relative: 23 %
Lymphs Abs: 1.1 10*3/uL (ref 0.7–4.0)
MCH: 30.6 pg (ref 26.0–34.0)
MCHC: 34 g/dL (ref 30.0–36.0)
MCV: 90 fL (ref 80.0–100.0)
Monocytes Absolute: 0.6 10*3/uL (ref 0.1–1.0)
Monocytes Relative: 13 %
Neutro Abs: 2.8 10*3/uL (ref 1.7–7.7)
Neutrophils Relative %: 60 %
Platelets: 61 10*3/uL — ABNORMAL LOW (ref 150–400)
RBC: 3.79 MIL/uL — ABNORMAL LOW (ref 3.87–5.11)
RDW: 16.8 % — ABNORMAL HIGH (ref 11.5–15.5)
WBC: 4.7 10*3/uL (ref 4.0–10.5)
nRBC: 0 % (ref 0.0–0.2)

## 2023-04-30 LAB — MAGNESIUM: Magnesium: 1.9 mg/dL (ref 1.7–2.4)

## 2023-04-30 MED ORDER — ONDANSETRON HCL 4 MG/2ML IJ SOLN
8.0000 mg | Freq: Once | INTRAMUSCULAR | Status: AC
  Administered 2023-04-30: 8 mg via INTRAVENOUS
  Filled 2023-04-30: qty 4

## 2023-04-30 MED ORDER — SODIUM CHLORIDE 0.9 % IV SOLN: Freq: Once | INTRAVENOUS | Status: AC

## 2023-04-30 MED ORDER — SODIUM CHLORIDE 0.9% FLUSH
10.0000 mL | Freq: Once | INTRAVENOUS | Status: AC | PRN
  Administered 2023-04-30: 10 mL

## 2023-04-30 MED ORDER — HEPARIN SOD (PORK) LOCK FLUSH 100 UNIT/ML IV SOLN
500.0000 [IU] | Freq: Once | INTRAVENOUS | Status: AC | PRN
  Administered 2023-04-30: 500 [IU]

## 2023-04-30 NOTE — Patient Instructions (Signed)
Take Ondansetron 8 mg, four times daily -- this will help with nausea Take Imodium 4 mg every 4 hrs for diarrhea Use the magic mouthwash every 4 hours while awake

## 2023-04-30 NOTE — Progress Notes (Signed)
[facility] Cancer Initial Visit:  Patient Care Team: Hurshel Party, NP as PCP - General (Internal Medicine) Loni Muse, MD as Consulting Physician (Internal Medicine)  CHIEF COMPLAINTS/PURPOSE OF CONSULTATION:  Oncology History  Colon cancer [facility])  01/15/2023 Initial Diagnosis   Colon cancer (HCC)   02/25/2023 Cancer Staging   Staging form: Colon and Rectum, AJCC 8th Edition - Clinical stage from 02/25/2023: Stage IIB (cT4a, cN0, cM0) - Signed by Loni Muse, MD on 02/25/2023 Histopathologic type: Adenocarcinoma, NOS Stage prefix: Initial diagnosis Total positive nodes: 0 Total nodes examined: 30 Histologic grade (G): G3 Histologic grading system: 4 grade system   03/11/2023 -  Chemotherapy   Patient is on Treatment Plan : COLORECTAL Xelox (Capeox)(130/850) q21d       HISTORY OF PRESENTING ILLNESS: [name] 67 y.o. female is here because of  colon cancer Medical history notable for septic arthritis of the hip, obesity, atypical chest pain, skin cancer, carpal tunnel syndrome, chronic vaginitis, chronic venous and sufficiency, TTP, syncope  January 10, 2023: Colonoscopy-ulcerated 2 cm nonobstructing small mass with heaped up margins found in distal transverse colon.  Mass was partially circumferential (involves less than one third of the lumen circumference) no bleeding present.  10 mm polyp in proximal ascending colon which was sessile.  Pathology-Ascending colon polyp was compatible with a sessile serrated adenoma without cytologic dysplasia Transverse colon polyp-invasive moderately differentiated adenocarcinoma arising within a tubular adenoma with high-grade dysplasia  January 15 2023: [facility] Medical Oncology Consult Patient reports that the colonoscopy was performed for surveillance; however, she was also having episodes of intermittent diarrhea x 2 to 3 months.  Stools were not bloody, nor was she passing mucus but they were soft.  In  association with these episodes she would also feel dizzy and note an odd smell, nausea.   To see Dr. Logan Bores from surgery on January 22 2023   Social:  Married.  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  Tobacco quit 24 years.  Rare glass of wine  [employer] Mother alive 79 epileptic, dementia Father died 72 Alzheimers, prostate cancer Brother alive 20 arthritis requiring joint replacement,   WBC 6.4 hemoglobin 12.5 MCV 88 platelet count 178 normal differential CMP normal CEA 2.9 chromogranin A 564  January 17 2023:  CT CAP Short segment asymmetric wall thickening of the transverse colon measuring 3.3 cm in length, compatible with reported history of  primary colonic neoplasm.  Small adjacent soft tissue nodule/lymph nodes adjacent to the colonic wall thickening measuring 6 mm, nonspecific are suspicious  for local nodal disease. Prominent/mildly enlarged right upper quadrant lymph nodes are similar dating back to January 26, 2019 and favored reactive.  No convincing evidence of distant metastatic disease within the chest, abdomen or pelvis.  Hepatomegaly with nodular hepatic contour, suggestive of cirrhosis Cholelithiasis without findings of acute cholecystitis.   Incidental right thyroid nodule measuring   January 18 2023:  Presented to [facility] ED with headaches and chest discomfort.  CT PA negative for PE.  CT head without contrast negative.    January 22 2023:  Scheduled follow up for colon cancer.  Reviewed results of labs and imaging with patient and husband. Will repeat chromgranin A and refer for PET/CT if chromogranin A still significantly elevated.    January 23, 2023: Chromogranin A 294  February 04 2023:  Transverse colectomy Pathology showed adenocarcinoma invading visceral peritoneum, Grade 3.  All of 30 lymph nodes were negative for tumor.  Surgical Stage (T4a, N0 M0)  MSI intact  Feb 25, 2023:   Reviewed results of surgical pathology with patient and husband.  Per NCCN guidelines adjuvant  options are CAPEOX, FOLFOX 6 months, Capecitabine or observation.  Discussed risks and benefits of each  Mar 07 2023:   Bifrontal headaches have returned.  These began in October 2023 but resolved after her colon surgery.  Has seen neurology and underwent MRI brain negative for CNS lesion.  EEG normal.   (Raises the question of what medications in perioperative period helped with the HA's.)  Recovering from a dog bite on dorsum of right hand.  Will receive Capecitabine today.    Mar 11 2023:  Port placement.  Cycle 1 XelOx   Mar 19 2023:  Neurology follow up.    Mar 20 2023:   Agree with Neurology assessment that neurocognitive testing should not be performed it at all, until well after chemotherapy has been completed.  Experienced some mild nausea helped by antiemetics.   Has occasional HA's.  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   No HFS.     April 01 2023:  Cycle 2 XelOx  April 17 2023:  Has lost 4 lbs.  Appetite diminished due to dysgeusia.  No metallic taste.  Edges of tongue get red and sore and painful.  Mouth feels dry despite drinking a lot of water and tea.  Not using anything for her mouth sores.   Fatigue increased.  Has cold neuropathy and cold induced pharyngospasm.      Begin Magic mouthwash for mucositis.     April 22 2023:  Cycle 3 XelOx.    April 30 2023:  Scheduled follow-up regarding colon cancer.  Patient called office yesterday stating that she was anorectic, nauseated, having mouth sores and diarrhea.  Has lost 13 lbs.  Has difficulty remembering details which complicates history taking.  States that anorexia began after starting this round of chemotherapy.  "Food does not get past my tongue"; did not recognize this was nausea so did not begin taking antiemetics until 3 days ago.  Only taking antiemetics bid.  Mouth feels dry but not painful.  No emesis.  Began with diarrhea in the form of 5 to 6 stools per day but does not believe  that she is taking imodium for it.  Dysgeusia even to water.   Not using magic mouthwash regularly  WBC 4.7 hemoglobin 11.6 platelet count 61; 60 segs 20 lymphs 13 monos 4 eos.  Chemistry is notable for potassium 3.4   Review of Systems  Constitutional:  Negative for appetite change, chills, fatigue, fever and unexpected weight change.       Has gained weight due to change in diet  HENT:   Negative for mouth sores, nosebleeds, sore throat, tinnitus and trouble swallowing.   Eyes:  Negative for eye problems and icterus.       Vision changes:  None  Respiratory:  Negative for chest tightness, hemoptysis, shortness of breath and wheezing.        One month of nonproductive cough.  No history of seasonable allergies  Cardiovascular:  Negative for chest pain and leg swelling.       Intermittent palpitations.  Chronic LE edema  Endocrine: Negative for hot flashes.       Cold intolerance:  none Heat intolerance:  none  Genitourinary:  Negative for bladder incontinence, difficulty urinating, dysuria, frequency, hematuria and nocturia.   Musculoskeletal:  Negative for  arthralgias, back pain, gait problem, myalgias, neck pain and neck stiffness.  Skin:  Negative for itching, rash and wound.  Neurological:  Negative for dizziness, extremity weakness, gait problem, headaches, numbness, seizures and speech difficulty.  Hematological:  Negative for adenopathy. Bruises/bleeds easily.  Psychiatric/Behavioral:  Negative for sleep disturbance and suicidal ideas. The patient is not nervous/anxious.     MEDICAL HISTORY: Past Medical History:  Diagnosis Date   Abnormal glucose    Achilles tendinitis of left lower extremity    Acute suppurative arthritis due to bacteria (HCC) 03/09/2011   Overview:  Last Assessment & Plan:  Methicillin sensitive staphylococcal aureus septic hip. She clearly does need I&D of the hip. I will have her continue the doxycycline for now but stop it 7 days prior to her surgery to  maximize the yield on the cultures in the operating room though I be shocked if we don't find methicillin sensitive staph aureus again.. I agree with removing as much of the pros   Anal fissure 11/30/2015   Arthritis    right hip   Atypical chest pain 04/09/2016   Bilateral edema of lower extremity    BMI 39.0-39.9,adult    Cancer [facility])    SKIN CANCER   Carpal tunnel syndrome 03/09/2011   Overview:  Last Assessment & Plan:  Clinically this seems to be carpal tunnel syndrome. Giving given her history of infection though we can keep in the back of our mind the idea that she will have a cervical spine problem but I think this is unlikely at this point in time medics carpal tunnel syndrome fits clinically this was a positive Tinel's sign however in for a brace for her.   Chronic vaginitis    Chronic venous insufficiency    Depression    Dyslipidemia    Fatigue 10/13/2015   Fibromyalgia    GERD without esophagitis    History of immune thrombocytopenia 03/09/2011   History of operative procedure on hip 03/10/2012   History of TTP (thrombotic thrombocytopenic purpura)    Hypokalemia    Lichen sclerosus et atrophicus of the vulva 11/30/2015   Major depressive disorder, recurrent, moderate (HCC)    Methicillin susceptible Staphylococcus aureus infection 03/09/2011   Overview:  Last Assessment & Plan:  This is undoubtedlythe culprit organism   Migraine    Morbid obesity with BMI of 45.0-49.9, adult (HCC) 10/13/2015   Morbidly obese (HCC)    MSSA (methicillin susceptible Staphylococcus aureus) infection    Near syncope    Osteoarthritis, chronic    Pain due to total hip replacement (HCC) 09/20/2011   Palpitations 03/04/2019   Postmenopausal    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 syndrome (HCC)    20 yrs ago-not seen anyone for 10 yrs.   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 Mild diverticulosis. Small internal hemorrhoids. Healed anal fissure   ENDOVENOUS ABLATION SAPHENOUS VEIN W/ LASER Right 04/02/2019   endovenous laser ablation right greater saphenous vein by Waverly Ferrari MD    JOINT REPLACEMENT  08/2009   right hip replacement   REVISION TOTAL HIP ARTHROPLASTY     TONSILLECTOMY  1963   TOTAL HIP REVISION  10/08/2011   Procedure: TOTAL HIP REVISION;  Surgeon: [name];  Location: [facility];  Service: Orthopedics;  Laterality: Right;  Resection of Right Total Hip/Extended Trochanteric Osteotomy/Placement of Antibiotic Total Hip Cemented by Depuy   TOTAL HIP REVISION  03/10/2012   Procedure: TOTAL HIP REVISION;  Surgeon: [name], MD;  Location: [facility];  Service: Orthopedics;  Laterality: Right;  Reimplantation/Revision of a Right Total Hip and Removal of Cemented Implant    SOCIAL HISTORY: Social History   Socioeconomic History   Marital status: Married    Spouse name: Raiford Noble   Number of children: Not on file   Years of education: 12+7   Highest education level: Bachelor's degree (e.g., BA, AB, BS)  Occupational History   Occupation: Magazine features editor: [employer]  Tobacco Use   Smoking status: Former    Packs/day: 1.50    Years: 20.00    Additional pack years: 0.00    Total pack years: 30.00    Types: Cigarettes    Quit date: 10/04/1990    Years since quitting: 32.5   Smokeless tobacco: Former    Quit date: 03/09/1991  Vaping Use   Vaping Use: Never used  Substance and Sexual Activity   Alcohol use: Yes    Comment: OCCASIONAL   Drug use: No   Sexual activity: Yes    Partners: Male  Other Topics Concern   Not on file  Social History Narrative   Not on file   Social Determinants of Health   Financial Resource Strain: Not on file  Food Insecurity: Not on file  Transportation Needs: Not  on file  Physical Activity: Not on file  Stress: Not on file  Social Connections: Not on file  Intimate Partner Violence: Not on file    FAMILY HISTORY Family History  Problem Relation Age of Onset   Hypertension Mother    Hypertension Father    Alzheimer's disease Father    Prostate cancer Father    Hypertension Maternal Grandfather    Seizures Other    Colon cancer Neg Hx    Stomach cancer Neg Hx    Rectal cancer Neg Hx    Esophageal cancer Neg Hx     ALLERGIES:  has No Known Allergies.  MEDICATIONS:  Current Outpatient Medications  Medication Sig Dispense Refill   aluminum-magnesium hydroxide 200-200 MG/5ML suspension Take 15 mLs by mouth every 6 (six) hours as needed for indigestion.     AMBULATORY NON FORMULARY MEDICATION Medication Name: Magic Mouth Wash 3 parts Mylanta/Maalox 2 parts Benadryl 1 part Viscous Lidocaine  Swish and Swallow 5 mL every 3-4 hours as needed 240 mL 0   buPROPion (WELLBUTRIN XL) 150 MG 24 hr tablet Take 150 mg by mouth every evening.     capecitabine (XELODA) 500 MG tablet Take 4 tablets (2,000 mg total) by mouth 2 (two) times daily after a meal. Take 14 days on, then 7 days off, repeat every 21 days. 112 tablet 7   clobetasol cream (TEMOVATE) 0.05 % Apply 1 Application topically as needed.     clotrimazole-betamethasone (LOTRISONE) cream Apply 1 Application topically 2 (two) times daily.     divalproex (DEPAKOTE) 250 MG DR tablet Take 250 mg by mouth 2 (two) times daily.     DULoxetine (CYMBALTA) 60 MG capsule Take 60 mg by mouth at bedtime.      esomeprazole (NEXIUM) 40 MG capsule Take 40 mg by mouth daily. Pt states has been on for years     ibuprofen (ADVIL) 200 MG tablet Take 200 mg by mouth every 6 (  six) hours as needed.     magic mouthwash (lidocaine, diphenhydrAMINE, alum & mag hydroxide) suspension Swish and swallow 5 mLs every 4 (four) hours as needed for mouth pain. 240 mL 0   meloxicam (MOBIC) 7.5 MG tablet Take 7.5 mg by mouth  every morning.     Multiple Vitamin (MULTIVITAMIN WITH MINERALS) TABS tablet Take 1 tablet by mouth daily.     nortriptyline (PAMELOR) 10 MG capsule Take 10 mg by mouth.     ondansetron (ZOFRAN) 8 MG tablet Take 1 tablet by mouth every 8 hours as needed for nausea or vomiting. Start on the third day after chemotherapy. 30 tablet 1   oxyCODONE (OXY IR/ROXICODONE) 5 MG immediate release tablet SMARTSIG:5 Milligram(s) By Mouth Every 4 Hours PRN     prochlorperazine (COMPAZINE) 10 MG tablet Take 1 tablet (10 mg total) by mouth every 6 (six) hours as needed for nausea or vomiting. 30 tablet 1   SUMAtriptan (IMITREX) 25 MG tablet Take 25 mg by mouth.     Current Facility-Administered Medications  Medication Dose Route Frequency Provider Last Rate Last Admin   triamcinolone acetonide (KENALOG-40) injection 20 mg  20 mg Other Once Asencion Islam, DPM        PHYSICAL EXAMINATION:  ECOG PERFORMANCE STATUS: 1 - Symptomatic but completely ambulatory   There were no vitals filed for this visit.   There were no vitals filed for this visit.    Physical Exam Vitals and nursing note reviewed.  Constitutional:      General: She is not in acute distress.    Appearance: Normal appearance. She is obese. She is not ill-appearing, toxic-appearing or diaphoretic.     Comments: Here alone  HENT:     Head: Normocephalic and atraumatic.     Right Ear: External ear normal.     Left Ear: External ear normal.     Nose: Nose normal. No congestion or rhinorrhea.  Eyes:     General: No scleral icterus.    Extraocular Movements: Extraocular movements intact.     Conjunctiva/sclera: Conjunctivae normal.     Pupils: Pupils are equal, round, and reactive to light.  Cardiovascular:     Rate and Rhythm: Normal rate.     Heart sounds: No murmur heard.    No friction rub. No gallop.  Abdominal:     General: Bowel sounds are normal.     Palpations: Abdomen is soft.     Tenderness: There is no abdominal  tenderness. There is no guarding or rebound.  Musculoskeletal:        General: No swelling, tenderness or deformity.     Cervical back: Normal range of motion and neck supple. No rigidity or tenderness.  Lymphadenopathy:     Head:     Right side of head: No submental, submandibular, tonsillar, preauricular, posterior auricular or occipital adenopathy.     Left side of head: No submental, submandibular, tonsillar, preauricular, posterior auricular or occipital adenopathy.     Cervical: No cervical adenopathy.     Right cervical: No superficial, deep or posterior cervical adenopathy.    Left cervical: No superficial, deep or posterior cervical adenopathy.     Upper Body:     Right upper body: No supraclavicular, axillary, pectoral or epitrochlear adenopathy.     Left upper body: No supraclavicular, axillary, pectoral or epitrochlear adenopathy.  Skin:    General: Skin is warm.     Coloration: Skin is not jaundiced.  Neurological:     General:  No focal deficit present.     Mental Status: She is alert and oriented to person, place, and time. Mental status is at baseline.     Cranial Nerves: No cranial nerve deficit.  Psychiatric:        Mood and Affect: Mood normal.        Behavior: Behavior normal.        Thought Content: Thought content normal.        Judgment: Judgment normal.      LABORATORY DATA: I have personally reviewed the data as listed:  Office Visit on 04/17/2023  Component Date Value Ref Range Status   WBC 04/17/2023 4.9  4.0 - 10.5 K/uL Final   RBC 04/17/2023 3.54 (L)  3.87 - 5.11 MIL/uL Final   Hemoglobin 04/17/2023 10.7 (L)  12.0 - 15.0 g/dL Final   HCT 16/07/9603 32.8 (L)  36.0 - 46.0 % Final   MCV 04/17/2023 92.7  80.0 - 100.0 fL Final   MCH 04/17/2023 30.2  26.0 - 34.0 pg Final   MCHC 04/17/2023 32.6  30.0 - 36.0 g/dL Final   RDW 54/06/8118 16.3 (H)  11.5 - 15.5 % Final   Platelets 04/17/2023 94 (L)  150 - 400 K/uL Final   Comment: SPECIMEN CHECKED FOR  CLOTS Immature Platelet Fraction may be clinically indicated, consider ordering this additional test [id number] REPEATED TO VERIFY    nRBC 04/17/2023 0.0  0.0 - 0.2 % Final   Neutrophils Relative % 04/17/2023 56  % Final   Neutro Abs 04/17/2023 2.8  1.7 - 7.7 K/uL Final   Lymphocytes Relative 04/17/2023 24  % Final   Lymphs Abs 04/17/2023 1.1  0.7 - 4.0 K/uL Final   Monocytes Relative 04/17/2023 15  % Final   Monocytes Absolute 04/17/2023 0.7  0.1 - 1.0 K/uL Final   Eosinophils Relative 04/17/2023 4  % Final   Eosinophils Absolute 04/17/2023 0.2  0.0 - 0.5 K/uL Final   Basophils Relative 04/17/2023 1  % Final   Basophils Absolute 04/17/2023 0.0  0.0 - 0.1 K/uL Final   Immature Granulocytes 04/17/2023 0  % Final   Abs Immature Granulocytes 04/17/2023 0.01  0.00 - 0.07 K/uL Final   Performed at [facility], [address], [place], [place] [address]   Sodium 04/17/2023 141  135 - 145 mmol/L Final   Potassium 04/17/2023 4.4  3.5 - 5.1 mmol/L Final   Chloride 04/17/2023 105  98 - 111 mmol/L Final   CO2 04/17/2023 27  22 - 32 mmol/L Final   Glucose, Bld 04/17/2023 105 (H)  70 - 99 mg/dL Final   Glucose reference range applies only to samples taken after fasting for at least 8 hours.   BUN 04/17/2023 12  8 - 23 mg/dL Final   Creatinine, Ser 04/17/2023 0.90  0.44 - 1.00 mg/dL Final   Calcium 65/78/4696 9.3  8.9 - 10.3 mg/dL Final   Total Protein 29/52/8413 6.2 (L)  6.5 - 8.1 g/dL Final   Albumin 24/40/1027 3.9  3.5 - 5.0 g/dL Final   AST 25/36/6440 22  15 - 41 U/L Final   ALT 04/17/2023 16  0 - 44 U/L Final   Alkaline Phosphatase 04/17/2023 65  38 - 126 U/L Final   Total Bilirubin 04/17/2023 0.6  0.3 - 1.2 mg/dL Final   GFR, Estimated 04/17/2023 >60  >60 mL/min Final   Comment: (NOTE) Calculated using the CKD-EPI Creatinine Equation (2021)    Anion gap 04/17/2023 9  5 -  15 Final   Performed at [facility], [address], [place], [place]  [address]  Appointment on 04/03/2023  Component Date Value Ref Range Status   WBC Count 04/03/2023 4.8  4.0 - 10.5 K/uL Final   RBC 04/03/2023 4.13  3.87 - 5.11 MIL/uL Final   Hemoglobin 04/03/2023 12.2  12.0 - 15.0 g/dL Final   HCT 60/45/4098 37.8  36.0 - 46.0 % Final   MCV 04/03/2023 91.5  80.0 - 100.0 fL Final   MCH 04/03/2023 29.5  26.0 - 34.0 pg Final   MCHC 04/03/2023 32.3  30.0 - 36.0 g/dL Final   RDW 11/91/4782 15.6 (H)  11.5 - 15.5 % Final   Platelet Count 04/03/2023 112 (L)  150 - 400 K/uL Final   nRBC 04/03/2023 0.0  0.0 - 0.2 % Final   Neutrophils Relative % 04/03/2023 57  % Final   Neutro Abs 04/03/2023 2.7  1.7 - 7.7 K/uL Final   Lymphocytes Relative 04/03/2023 28  % Final   Lymphs Abs 04/03/2023 1.3  0.7 - 4.0 K/uL Final   Monocytes Relative 04/03/2023 13  % Final   Monocytes Absolute 04/03/2023 0.6  0.1 - 1.0 K/uL Final   Eosinophils Relative 04/03/2023 2  % Final   Eosinophils Absolute 04/03/2023 0.1  0.0 - 0.5 K/uL Final   Basophils Relative 04/03/2023 0  % Final   Basophils Absolute 04/03/2023 0.0  0.0 - 0.1 K/uL Final   Immature Granulocytes 04/03/2023 0  % Final   Abs Immature Granulocytes 04/03/2023 0.02  0.00 - 0.07 K/uL Final   Performed at [facility], [address] [place], [place] [address]   Sodium 04/03/2023 138  135 - 145 mmol/L Final   Potassium 04/03/2023 4.2  3.5 - 5.1 mmol/L Final   Chloride 04/03/2023 105  98 - 111 mmol/L Final   CO2 04/03/2023 25  22 - 32 mmol/L Final   Glucose, Bld 04/03/2023 89  70 - 99 mg/dL Final   Glucose reference range applies only to samples taken after fasting for at least 8 hours.   BUN 04/03/2023 18  8 - 23 mg/dL Final   Creatinine 30/86/5784 0.74  0.44 - 1.00 mg/dL Final   Calcium 69/62/9528 8.9  8.9 - 10.3 mg/dL Final   Total Protein 41/32/4401 6.6  6.5 - 8.1 g/dL Final   Albumin 02/72/5366 3.9  3.5 - 5.0 g/dL Final   AST 44/12/4740 35  15 - 41 U/L Final   ALT 04/03/2023 25  0 - 44 U/L Final    Alkaline Phosphatase 04/03/2023 77  38 - 126 U/L Final   Total Bilirubin 04/03/2023 1.1  0.3 - 1.2 mg/dL Final   GFR, Estimated 04/03/2023 >60  >60 mL/min Final   Comment: (NOTE) Calculated using the CKD-EPI Creatinine Equation (2021)    Anion gap 04/03/2023 8  5 - 15 Final   Performed at [facility], [address], [place], [place] [address]    RADIOGRAPHIC STUDIES: I have personally reviewed the radiological images as listed and agree with the findings in the report  No results found.  ASSESSMENT/PLAN  67 y.o. female is here because of  colon cancer.  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 Mass was partially circumferential (involves < 1/3rd of the lumen circumference). 10 mm polyp in proximal ascending colon which was sessile.  Pathology ulcerated mass- invasive moderately differentiated adenocarcinoma arising within a tubular adenoma with high-grade dysplasia   January 15 2023- CEA 2.9 Chromogranin A 564  January 17 2023- CT CAP  Short segment asymmetric wall thickening of the transverse colon measuring 3.3 cm in length.   Small adjacent soft tissue nodule/lymph nodes adjacent to the colonic wall thickening measuring 6 mm, suspicious  for local nodal disease. Prominent/mildly enlarged right upper quadrant lymph nodes similar to January 26, 2019 and favored reactive.  No distant metastatic disease.  Hepatomegaly with nodular hepatic contour, suggestive of cirrhosis Cholelithiasis without findings of acute cholecystitis.   Incidental right thyroid nodule   January 23 2023- Chromogranin A 294  February 04 2023:  Transverse colectomy.  Pathology showed adenocarcinoma invading visceral peritoneum, Grade 3.  All of 30 lymph nodes negative for tumor.  MSI intact Feb 25 2023- On basis of spontaneous improvement in chromogranin A will hold on Dodatate PET.     Therapeutics-  Feb 25 2023- Reviewed NCCN treatment guidelines with patient and husband.  They are agreeable to proceed with adjuvant CAPOX x 6 months.   Mar 11 2023- Cycle 1 XelOx Mar 20 2023- Tolerated cycle 1 well with only some mild cold neuropathy April 01 2023- Cycle 2 XelOx April 22 2023- Cycle 3 XelOX  Diarrhea:             January 15 2023- Not explained by the colon cancer.  No evidence of inflammatory bowel disease by colonoscopy.    Chromogranin A 564  January 23 2023- Repeat Chromogranin A improved  Feb 25 2023- Diarrhea and chromogranin A improved therefore holding on [facility] PET/CT   Chemotherapy induced nausea and diarrhea  April 30 2023-  Instructed patient to do the following-- Take Ondansetron 8 mg, four times daily.  Take Imodium 4 mg every 4 hrs for diarrhea Use the magic mouthwash every 4 hours while awake.  Arranged for patient to receive IVF and antiemetics.  To follow up in 2 days to assess progress  Poor venous access:    Mar 11 2023- Port placement   Headaches Mar 07 2023- Improved following surgery likely related to a component of general anesthesia.  To see neurology  Possible cirrhosis  January 17 2023- Nodular liver contour noted on CT CAP    Cancer Staging  Colon cancer [facility]) Staging form: Colon and Rectum, AJCC 8th Edition - Clinical stage from 02/25/2023: Stage IIB (cT4a, cN0, cM0) - Signed by Loni Muse, MD on 02/25/2023 Histopathologic type: Adenocarcinoma, NOS Stage prefix: Initial diagnosis Total positive nodes: 0 Total nodes examined: 30 Histologic grade (G): G3 Histologic grading system: 4 grade system    No problem-specific Assessment & Plan notes found for this encounter.    No orders of  the defined types were placed in this encounter.   30  minutes was spent in patient care.  This included time spent preparing to see the patient (e.g., review of tests), obtaining and/or reviewing separately obtained history, counseling and educating the patient/family/caregiver, ordering medications, tests, or procedures; documenting clinical information in the electronic or other health record, independently interpreting results and communicating results to the patient/family/caregiver as well as coordination of care.       All questions were answered. The patient knows to  call the clinic with any problems, questions or concerns.  This note was electronically signed.    Loni Muse, MD  04/30/2023 10:18 AM

## 2023-04-30 NOTE — Patient Instructions (Addendum)
Dehydration, Adult Dehydration is a condition in which there is not enough water or other fluids in the body. This happens when a person loses more fluids than they take in. Important organs cannot work right without the right amount of fluids. Any loss of fluids from the body can cause dehydration. Dehydration can be mild, worse, or very bad. It should be treated right away to keep it from getting very bad. What are the causes? Conditions that cause loss of water in the body. They include: Watery poop (diarrhea). Vomiting. Sweating a lot. Fever. Infection. Peeing (urinating) a lot. Not drinking enough fluids. Certain medicines, such as medicines that take extra fluid out of the body (diuretics). Lack of safe drinking water. Not being able to get enough water and food. What increases the risk? Having a long-term (chronic) illness that has not been treated the right way, such as: Diabetes. Heart disease. Kidney disease. Being 48 years of age or older. Having a disability. Living in a place that is high above the ground or sea (high in altitude). The thinner, drier air causes more fluid loss. Doing exercises that put stress on your body for a long time. Being active when in hot places. What are the signs or symptoms? Symptoms of dehydration depend on how bad it is. Mild or worse dehydration Thirst. Dry lips or dry mouth. Feeling dizzy or light-headed. Muscle cramps. Passing little pee or dark pee. Pee may be the color of tea. Headache. Very bad dehydration Changes in skin. Skin may: Be cold to the touch (clammy). Be blotchy or pale. Not go back to normal right after you pinch it and let it go. Little or no tears, pee, or sweat. Fast breathing. Low blood pressure. Weak pulse. Pulse that is more than 100 beats a minute when you are sitting still. Other changes, such as: Feeling very thirsty. Eyes that look hollow (sunken). Cold hands and feet. Being confused. Being very  tired (lethargic) or having trouble waking from sleep. Losing weight. Loss of consciousness. How is this treated? Treatment for this condition depends on how bad your dehydration is. Treatment should start right away. Do not wait until your condition gets very bad. Very bad dehydration is an emergency. You will need to go to a hospital. Mild or worse dehydration can be treated at home. You may be asked to: Drink more fluids. Drink an oral rehydration solution (ORS). This drink gives you the right amount of fluids, salts, and minerals (electrolytes). Very bad dehydration can be treated: With fluids through an IV tube. By correcting low levels of electrolytes in the body. By treating the problem that caused your dehydration. Follow these instructions at home: Oral rehydration solution If told by your doctor, drink an ORS: Make an ORS. Use instructions on the package. Start by drinking small amounts, about  cup (120 mL) every 5-10 minutes. Slowly drink more until you have had the amount that your doctor said to have.  Eating and drinking  Drink enough clear fluid to keep your pee pale yellow. If you were told to drink an ORS, finish the ORS first. Then, start slowly drinking other clear fluids. Drink fluids such as: Water. Do not drink only water. Doing that can make the salt (sodium) level in your body get too low. Water from ice chips you suck on. Fruit juice that you have added water to (diluted). Low-calorie sports drinks. Eat foods that have the right amounts of salts and minerals, such as bananas, oranges, potatoes,  tomatoes, or spinach. Do not drink alcohol. Avoid drinks that have caffeine or sugar. These include:: High-calorie sports drinks. Fruit juice that you did not add water to. Soda. Coffee or energy drinks. Avoid foods that are greasy or have a lot of fat or sugar. General instructions Take over-the-counter and prescription medicines only as told by your doctor. Do  not take sodium tablets. Doing that can make the salt level in your body get too high. Return to your normal activities as told by your doctor. Ask your doctor what activities are safe for you. Keep all follow-up visits. Your doctor may check and change your treatment. Contact a doctor if: You have pain in your belly (abdomen) and the pain: Gets worse. Stays in one place. You have a rash. You have a stiff neck. You get angry or annoyed more easily than normal. You are more tired or have a harder time waking than normal. You feel weak or dizzy. You feel very thirsty. Get help right away if: You have any symptoms of very bad dehydration. You vomit every time you eat or drink. Your vomiting gets worse, does not go away, or you vomit blood or green stuff. You are getting treatment, but symptoms are getting worse. You have a fever. You have a very bad headache. You have: Diarrhea that gets worse or does not go away. Blood in your poop (stool). This may cause poop to look black and tarry. No pee in 6-8 hours. Only a small amount of pee in 6-8 hours, and the pee is very dark. You have trouble breathing. These symptoms may be an emergency. Get help right away. Call 911. Do not wait to see if the symptoms will go away. Do not drive yourself to the hospital. This information is not intended to replace advice given to you by your health care provider. Make sure you discuss any questions you have with your health care provider. Document Revised: 05/07/2022 Document Reviewed: 05/07/2022 Elsevier Patient Education  2024 [employer]. \[id number]\

## 2023-05-02 ENCOUNTER — Inpatient Hospital Stay: Payer: Medicare PPO | Admitting: Oncology

## 2023-05-02 ENCOUNTER — Inpatient Hospital Stay: Payer: Medicare PPO

## 2023-05-02 ENCOUNTER — Inpatient Hospital Stay (INDEPENDENT_AMBULATORY_CARE_PROVIDER_SITE_OTHER): Payer: Medicare PPO | Admitting: Oncology

## 2023-05-02 VITALS — BP 107/79 | HR 95 | Temp 97.6°F | Resp 18 | Ht 65.9 in | Wt 229.6 lb

## 2023-05-02 DIAGNOSIS — K1231 Oral mucositis (ulcerative) due to antineoplastic therapy: Secondary | ICD-10-CM | POA: Diagnosis not present

## 2023-05-02 DIAGNOSIS — Z09 Encounter for follow-up examination after completed treatment for conditions other than malignant neoplasm: Secondary | ICD-10-CM | POA: Diagnosis not present

## 2023-05-02 DIAGNOSIS — R110 Nausea: Secondary | ICD-10-CM

## 2023-05-02 DIAGNOSIS — K521 Toxic gastroenteritis and colitis: Secondary | ICD-10-CM

## 2023-05-02 DIAGNOSIS — C184 Malignant neoplasm of transverse colon: Secondary | ICD-10-CM

## 2023-05-02 DIAGNOSIS — T451X5A Adverse effect of antineoplastic and immunosuppressive drugs, initial encounter: Secondary | ICD-10-CM | POA: Diagnosis not present

## 2023-05-02 NOTE — Progress Notes (Signed)
[facility] Cancer Initial Visit:  Patient Care Team: Hurshel Party, NP as PCP - General (Internal Medicine) Loni Muse, MD as Consulting Physician (Internal Medicine)  CHIEF COMPLAINTS/PURPOSE OF CONSULTATION:  Oncology History  Colon cancer [facility])  01/15/2023 Initial Diagnosis   Colon cancer (HCC)   02/25/2023 Cancer Staging   Staging form: Colon and Rectum, AJCC 8th Edition - Clinical stage from 02/25/2023: Stage IIB (cT4a, cN0, cM0) - Signed by Loni Muse, MD on 02/25/2023 Histopathologic type: Adenocarcinoma, NOS Stage prefix: Initial diagnosis Total positive nodes: 0 Total nodes examined: 30 Histologic grade (G): G3 Histologic grading system: 4 grade system   03/11/2023 -  Chemotherapy   Patient is on Treatment Plan : COLORECTAL Xelox (Capeox)(130/850) q21d       HISTORY OF PRESENTING ILLNESS: [name] 67 y.o. female is here because of  colon cancer Medical history notable for septic arthritis of the hip, obesity, atypical chest pain, skin cancer, carpal tunnel syndrome, chronic vaginitis, chronic venous and sufficiency, TTP, syncope  January 10, 2023: Colonoscopy-ulcerated 2 cm nonobstructing small mass with heaped up margins found in distal transverse colon.  Mass was partially circumferential (involves less than one third of the lumen circumference) no bleeding present.  10 mm polyp in proximal ascending colon which was sessile.  Pathology-Ascending colon polyp was compatible with a sessile serrated adenoma without cytologic dysplasia Transverse colon polyp-invasive moderately differentiated adenocarcinoma arising within a tubular adenoma with high-grade dysplasia  January 15 2023: [name] Medical Oncology Consult Patient reports that the colonoscopy was performed for surveillance; however, she was also having episodes of intermittent diarrhea x 2 to 3 months.  Stools were not bloody, nor was she passing mucus but they were soft.  In  association with these episodes she would also feel dizzy and note an odd smell, nausea.   To see Dr. Logan Bores from surgery on January 22 2023   Social:  Married.  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  Tobacco quit 24 years.  Rare glass of wine  [facility] Mother alive 10 epileptic, dementia Father died 72 Alzheimers, prostate cancer Brother alive 53 arthritis requiring joint replacement,   WBC 6.4 hemoglobin 12.5 MCV 88 platelet count 178 normal differential CMP normal CEA 2.9 chromogranin A 564  January 17 2023:  CT CAP Short segment asymmetric wall thickening of the transverse colon measuring 3.3 cm in length, compatible with reported history of  primary colonic neoplasm.  Small adjacent soft tissue nodule/lymph nodes adjacent to the colonic wall thickening measuring 6 mm, nonspecific are suspicious  for local nodal disease. Prominent/mildly enlarged right upper quadrant lymph nodes are similar dating back to January 26, 2019 and favored reactive.  No convincing evidence of distant metastatic disease within the chest, abdomen or pelvis.  Hepatomegaly with nodular hepatic contour, suggestive of cirrhosis Cholelithiasis without findings of acute cholecystitis.   Incidental right thyroid nodule measuring   January 18 2023:  Presented to [facility] ED with headaches and chest discomfort.  CT PA negative for PE.  CT head without contrast negative.    January 22 2023:  Scheduled follow up for colon cancer.  Reviewed results of labs and imaging with patient and husband. Will repeat chromgranin A and refer for PET/CT if chromogranin A still significantly elevated.    January 23, 2023: Chromogranin A 294  February 04 2023:  Transverse colectomy Pathology showed adenocarcinoma invading visceral peritoneum, Grade 3.  All of 30 lymph nodes were negative for tumor.  Surgical Stage (T4a, N0 M0)  MSI intact  Feb 25, 2023:   Reviewed results of surgical pathology with patient and husband.  Per NCCN guidelines adjuvant  options are CAPEOX, FOLFOX 6 months, Capecitabine or observation.  Discussed risks and benefits of each  Mar 07 2023:   Bifrontal headaches have returned.  These began in October 2023 but resolved after [name] colon surgery.  Has seen neurology and underwent MRI brain negative for CNS lesion.  EEG normal.   (Raises the question of what medications in perioperative period helped with the HA's.)  Recovering from a dog bite on dorsum of right hand.  Will receive Capecitabine today.    Mar 11 2023:  Port placement.  Cycle 1 XelOx   Mar 19 2023:  Neurology follow up.    Mar 20 2023:   Agree with Neurology assessment that neurocognitive testing should not be performed it at all, until well after chemotherapy has been completed.  Experienced some mild nausea helped by antiemetics.   Has occasional HA's.  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   No HFS.     April 01 2023:  Cycle 2 XelOx  April 17 2023:  Has lost 4 lbs.  Appetite diminished due to dysgeusia.  No metallic taste.  Edges of tongue get red and sore and painful.  Mouth feels dry despite drinking a lot of water and tea.  Not using anything for [name] mouth sores.   Fatigue increased.  Has cold neuropathy and cold induced pharyngospasm.      Begin Magic mouthwash for mucositis.     April 22 2023:  Cycle 3 XelOx.    April 30 2023:    Patient called office yesterday stating that she was anorectic, nauseated, having mouth sores and diarrhea.  Has lost 13 lbs.  Has difficulty remembering details which complicates history taking.  States that anorexia began after starting this round of chemotherapy.  "Food does not get past my tongue"; did not recognize this was nausea so did not begin taking antiemetics until 3 days ago.  Only taking antiemetics bid.  Mouth feels dry but not painful.  No emesis.  Began with diarrhea in the form of 5 to 6 stools per day but does not believe that she is taking imodium for it.  Dysgeusia  even to water.   Not using magic mouthwash regularly  May 02 2023:  Scheduled follow-up regarding colon cancer.  Diarrhea improved with imodium.  Using ondansetron alternating with compazine; no emesis with improvement in nausea.  Eating limited by mucositis which she can't say is helped by magic mouthwash.  Not eating cold foods.  Has gained 2 lbs in the last two days.  To complete the course of Xeloda on May 06 2023.    WBC 4.7 hemoglobin 11.6 platelet count 61; 60 segs 23 lymphs 13 monos 4 eos CMP notable for potassium 3.4 glucose 103  May 13 2023:  Cycle 4 XelOX  Review of Systems  Constitutional:  Negative for appetite change, chills, fatigue, fever and unexpected weight change.       Has gained weight due to change in diet  HENT:   Negative for mouth sores, nosebleeds, sore throat, tinnitus and trouble swallowing.   Eyes:  Negative for eye problems and icterus.       Vision changes:  None  Respiratory:  Negative for chest tightness, hemoptysis, shortness of breath and wheezing.        One  month of nonproductive cough.  No history of seasonable allergies  Cardiovascular:  Negative for chest pain and leg swelling.       Intermittent palpitations.  Chronic LE edema  Endocrine: Negative for hot flashes.       Cold intolerance:  none Heat intolerance:  none  Genitourinary:  Negative for bladder incontinence, difficulty urinating, dysuria, frequency, hematuria and nocturia.   Musculoskeletal:  Negative for arthralgias, back pain, gait problem, myalgias, neck pain and neck stiffness.  Skin:  Negative for itching, rash and wound.  Neurological:  Negative for dizziness, extremity weakness, gait problem, headaches, numbness, seizures and speech difficulty.  Hematological:  Negative for adenopathy. Bruises/bleeds easily.  Psychiatric/Behavioral:  Negative for sleep disturbance and suicidal ideas. The patient is not nervous/anxious.     MEDICAL HISTORY: Past Medical History:  Diagnosis  Date  . Abnormal glucose   . Achilles tendinitis of left lower extremity   . Acute suppurative arthritis due to bacteria (HCC) 03/09/2011   Overview:  Last Assessment & Plan:  Methicillin sensitive staphylococcal aureus septic hip. She clearly does need I&D of the hip. I will have [name] continue the doxycycline for now but stop it 7 days prior to [name] surgery to maximize the yield on the cultures in the operating room though I be shocked if we don't find methicillin sensitive staph aureus again.. I agree with removing as much of the pros  . Anal fissure 11/30/2015  . Arthritis    right hip  . Atypical chest pain 04/09/2016  . Bilateral edema of lower extremity   . BMI 39.0-39.9,adult   . Cancer [facility])    SKIN CANCER  . Carpal tunnel syndrome 03/09/2011   Overview:  Last Assessment & Plan:  Clinically this seems to be carpal tunnel syndrome. Giving given [name] history of infection though we can keep in the back of our mind the idea that she will have a cervical spine problem but I think this is unlikely at this point in time medics carpal tunnel syndrome fits clinically this was a positive Tinel's sign however in for a brace for [name].  . Chronic vaginitis   . Chronic venous insufficiency   . Depression   . Dyslipidemia   . Fatigue 10/13/2015  . Fibromyalgia   . GERD without esophagitis   . History of immune thrombocytopenia 03/09/2011  . History of operative procedure on hip 03/10/2012  . History of TTP (thrombotic thrombocytopenic purpura)   . Hypokalemia   . Lichen sclerosus et atrophicus of the vulva 11/30/2015  . Major depressive disorder, recurrent, moderate (HCC)   . Methicillin susceptible Staphylococcus aureus infection 03/09/2011   Overview:  Last Assessment & Plan:  This is undoubtedlythe culprit organism  . Migraine   . Morbid obesity with BMI of 45.0-49.9, adult (HCC) 10/13/2015  . Morbidly obese (HCC)   . MSSA (methicillin susceptible Staphylococcus aureus) infection   . Near  syncope   . Osteoarthritis, chronic   . Pain due to total hip replacement (HCC) 09/20/2011  . Palpitations 03/04/2019  . Postmenopausal   . 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 Snoring 11/22/2015  . T.T.P. syndrome (HCC)    20 yrs ago-not seen anyone for 10 yrs.  . Vitamin D deficiency   . 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    SURGICAL HISTORY: Past Surgical History:  Procedure Laterality Date  . COLONOSCOPY  03/01/2011   Mild melanosis coli. Mild diverticulosis. Small internal hemorrhoids. Healed anal  fissure  . ENDOVENOUS ABLATION SAPHENOUS VEIN W/ LASER Right 04/02/2019   endovenous laser ablation right greater saphenous vein by Waverly Ferrari MD   . JOINT REPLACEMENT  08/2009   right hip replacement  . REVISION TOTAL HIP ARTHROPLASTY    . TONSILLECTOMY  1963  . TOTAL HIP REVISION  10/08/2011   Procedure: TOTAL HIP REVISION;  Surgeon: [name];  Location: [facility];  Service: Orthopedics;  Laterality: Right;  Resection of Right Total Hip/Extended Trochanteric Osteotomy/Placement of Antibiotic Total Hip Cemented by Depuy  . TOTAL HIP REVISION  03/10/2012   Procedure: TOTAL HIP REVISION;  Surgeon: [name], MD;  Location: [facility];  Service: Orthopedics;  Laterality: Right;  Reimplantation/Revision of a Right Total Hip and Removal of Cemented Implant    SOCIAL HISTORY: Social History   Socioeconomic History  . Marital status: Married    Spouse name: Raiford Noble  . Number of children: Not on file  . Years of education: 12+7  . Highest education level: Bachelor's degree (e.g., BA, AB, BS)  Occupational History  . Occupation: Magazine features editor: [employer]  Tobacco Use  . Smoking status: Former    Current packs/day: 0.00    Average packs/day: 1.5 packs/day for 20.0 years (30.0 ttl pk-yrs)    Types: Cigarettes    Start date: 10/04/1970    Quit date: 10/04/1990    Years since quitting: 32.5  .  Smokeless tobacco: Former    Quit date: 03/09/1991  Vaping Use  . Vaping status: Never Used  Substance and Sexual Activity  . Alcohol use: Yes    Comment: OCCASIONAL  . Drug use: No  . Sexual activity: Yes    Partners: Male  Other Topics Concern  . Not on file  Social History Narrative  . Not on file   Social Determinants of Health   Financial Resource Strain: Not on file  Food Insecurity: Low Risk  (02/04/2023)   Received from [facility]   Food vital sign   . Within the past 12 months, you worried that your food would run out before you got money to buy more: Never true   . Within the past 12 months, the food you bought just didn't last and you didn't have money to get more. : Never true  Transportation Needs: Not on file (02/04/2023)  Physical Activity: Not on file  Stress: Not on file  Social Connections: Not on file  Intimate Partner Violence: Not on file    FAMILY HISTORY Family History  Problem Relation Age of Onset  . Hypertension Mother   . Hypertension Father   . Alzheimer's disease Father   . Prostate cancer Father   . Hypertension Maternal Grandfather   . Seizures Other   . Colon cancer Neg Hx   . Stomach cancer Neg Hx   . Rectal cancer Neg Hx   . Esophageal cancer Neg Hx     ALLERGIES:  has No Known Allergies.  MEDICATIONS:  Current Outpatient Medications  Medication Sig Dispense Refill  . aluminum-magnesium hydroxide 200-200 MG/5ML suspension Take 15 mLs by mouth every 6 (six) hours as needed for indigestion.    . AMBULATORY NON FORMULARY MEDICATION Medication Name: Magic Mouth Wash 3 parts Mylanta/Maalox 2 parts Benadryl 1 part Viscous Lidocaine  Swish and Swallow 5 mL every 3-4 hours as needed 240 mL 0  . buPROPion (WELLBUTRIN XL) 150 MG 24 hr tablet Take 150 mg by mouth every evening.    [name] Kitchen  capecitabine (XELODA) 500 MG tablet Take 4 tablets (2,000 mg total) by mouth 2 (two) times daily after a meal. Take 14 days on, then 7 days  off, repeat every 21 days. 112 tablet 7  . clobetasol cream (TEMOVATE) 0.05 % Apply 1 Application topically as needed.    . clotrimazole-betamethasone (LOTRISONE) cream Apply 1 Application topically 2 (two) times daily.    . divalproex (DEPAKOTE) 250 MG DR tablet Take 250 mg by mouth 2 (two) times daily.    . DULoxetine (CYMBALTA) 60 MG capsule Take 60 mg by mouth at bedtime.     [name] Kitchen esomeprazole (NEXIUM) 40 MG capsule Take 40 mg by mouth daily. Pt states has been on for years    . ibuprofen (ADVIL) 200 MG tablet Take 200 mg by mouth every 6 (six) hours as needed.    . magic mouthwash (lidocaine, diphenhydrAMINE, alum & mag hydroxide) suspension Swish and swallow 5 mLs every 4 (four) hours as needed for mouth pain. 240 mL 0  . meloxicam (MOBIC) 7.5 MG tablet Take 7.5 mg by mouth every morning.    . Multiple Vitamin (MULTIVITAMIN WITH MINERALS) TABS tablet Take 1 tablet by mouth daily.    . nortriptyline (PAMELOR) 10 MG capsule Take 10 mg by mouth.    . ondansetron (ZOFRAN) 8 MG tablet Take 1 tablet by mouth every 8 hours as needed for nausea or vomiting. Start on the third day after chemotherapy. 30 tablet 1  . oxyCODONE (OXY IR/ROXICODONE) 5 MG immediate release tablet SMARTSIG:5 Milligram(s) By Mouth Every 4 Hours PRN    . prochlorperazine (COMPAZINE) 10 MG tablet Take 1 tablet (10 mg total) by mouth every 6 (six) hours as needed for nausea or vomiting. 30 tablet 1  . SUMAtriptan (IMITREX) 25 MG tablet Take 25 mg by mouth.     Current Facility-Administered Medications  Medication Dose Route Frequency Provider Last Rate Last Admin  . triamcinolone acetonide (KENALOG-40) injection 20 mg  20 mg Other Once Asencion Islam, DPM        PHYSICAL EXAMINATION:  ECOG PERFORMANCE STATUS: 1 - Symptomatic but completely ambulatory   There were no vitals filed for this visit.   There were no vitals filed for this visit.    Physical Exam Vitals and nursing note reviewed.  Constitutional:       General: She is not in acute distress.    Appearance: Normal appearance. She is obese. She is not ill-appearing, toxic-appearing or diaphoretic.     Comments: Here alone.    HENT:     Head: Normocephalic and atraumatic.     Right Ear: External ear normal.     Left Ear: External ear normal.     Nose: Nose normal. No congestion or rhinorrhea.  Eyes:     General: No scleral icterus.    Extraocular Movements: Extraocular movements intact.     Conjunctiva/sclera: Conjunctivae normal.     Pupils: Pupils are equal, round, and reactive to light.  Cardiovascular:     Rate and Rhythm: Normal rate.     Heart sounds: No murmur heard.    No friction rub. No gallop.  Abdominal:     General: Bowel sounds are normal.     Palpations: Abdomen is soft.     Tenderness: There is no abdominal tenderness. There is no guarding or rebound.  Musculoskeletal:        General: No swelling, tenderness or deformity.     Cervical back: Normal range of motion and neck supple.  No rigidity or tenderness.  Lymphadenopathy:     Head:     Right side of head: No submental, submandibular, tonsillar, preauricular, posterior auricular or occipital adenopathy.     Left side of head: No submental, submandibular, tonsillar, preauricular, posterior auricular or occipital adenopathy.     Cervical: No cervical adenopathy.     Right cervical: No superficial, deep or posterior cervical adenopathy.    Left cervical: No superficial, deep or posterior cervical adenopathy.     Upper Body:     Right upper body: No supraclavicular, axillary, pectoral or epitrochlear adenopathy.     Left upper body: No supraclavicular, axillary, pectoral or epitrochlear adenopathy.  Skin:    General: Skin is warm.     Coloration: Skin is not jaundiced.  Neurological:     General: No focal deficit present.     Mental Status: She is alert and oriented to person, place, and time. Mental status is at baseline.     Cranial Nerves: No cranial nerve  deficit.  Psychiatric:        Mood and Affect: Mood normal.        Behavior: Behavior normal.        Thought Content: Thought content normal.        Judgment: Judgment normal.     LABORATORY DATA: I have personally reviewed the data as listed:  Appointment on 04/30/2023  Component Date Value Ref Range Status  . Sodium 04/30/2023 138  135 - 145 mmol/L Final  . Potassium 04/30/2023 3.4 (L)  3.5 - 5.1 mmol/L Final  . Chloride 04/30/2023 104  98 - 111 mmol/L Final  . CO2 04/30/2023 23  22 - 32 mmol/L Final  . Glucose, Bld 04/30/2023 103 (H)  70 - 99 mg/dL Final   Glucose reference range applies only to samples taken after fasting for at least 8 hours.  . BUN 04/30/2023 19  8 - 23 mg/dL Final  . Creatinine, Ser 04/30/2023 0.92  0.44 - 1.00 mg/dL Final  . Calcium 66/44/0347 9.1  8.9 - 10.3 mg/dL Final  . Total Protein 04/30/2023 6.9  6.5 - 8.1 g/dL Final  . Albumin 42/59/5638 4.1  3.5 - 5.0 g/dL Final  . AST 75/64/3329 32  15 - 41 U/L Final  . ALT 04/30/2023 21  0 - 44 U/L Final  . Alkaline Phosphatase 04/30/2023 63  38 - 126 U/L Final  . Total Bilirubin 04/30/2023 1.1  0.3 - 1.2 mg/dL Final  . GFR, Estimated 04/30/2023 >60  >60 mL/min Final   Comment: (NOTE) Calculated using the CKD-EPI Creatinine Equation (2021)   . Anion gap 04/30/2023 11  5 - 15 Final   Performed at [facility], [address], [place], [place] [address]  . WBC 04/30/2023 4.7  4.0 - 10.5 K/uL Final  . RBC 04/30/2023 3.79 (L)  3.87 - 5.11 MIL/uL Final  . Hemoglobin 04/30/2023 11.6 (L)  12.0 - 15.0 g/dL Final  . HCT 16/60/6301 34.1 (L)  36.0 - 46.0 % Final  . MCV 04/30/2023 90.0  80.0 - 100.0 fL Final  . MCH 04/30/2023 30.6  26.0 - 34.0 pg Final  . MCHC 04/30/2023 34.0  30.0 - 36.0 g/dL Final  . RDW 60/07/9322 16.8 (H)  11.5 - 15.5 % Final  . Platelets 04/30/2023 61 (L)  150 - 400 K/uL Final   Comment: Immature Platelet Fraction may be clinically indicated, consider ordering this  additional test [id number] REPEATED TO VERIFY PLATELET COUNT CONFIRMED BY  SMEAR   . nRBC 04/30/2023 0.0  0.0 - 0.2 % Final  . Neutrophils Relative % 04/30/2023 60  % Final  . Neutro Abs 04/30/2023 2.8  1.7 - 7.7 K/uL Final  . Lymphocytes Relative 04/30/2023 23  % Final  . Lymphs Abs 04/30/2023 1.1  0.7 - 4.0 K/uL Final  . Monocytes Relative 04/30/2023 13  % Final  . Monocytes Absolute 04/30/2023 0.6  0.1 - 1.0 K/uL Final  . Eosinophils Relative 04/30/2023 4  % Final  . Eosinophils Absolute 04/30/2023 0.2  0.0 - 0.5 K/uL Final  . Basophils Relative 04/30/2023 0  % Final  . Basophils Absolute 04/30/2023 0.0  0.0 - 0.1 K/uL Final  . Immature Granulocytes 04/30/2023 0  % Final  . Abs Immature Granulocytes 04/30/2023 0.02  0.00 - 0.07 K/uL Final   Performed at [facility], [address] [place], [place] [address]  . Magnesium 04/30/2023 1.9  1.7 - 2.4 mg/dL Final   Performed at [facility], [address], [place], [place] [address]  Office Visit on 04/17/2023  Component Date Value Ref Range Status  . WBC 04/17/2023 4.9  4.0 - 10.5 K/uL Final  . RBC 04/17/2023 3.54 (L)  3.87 - 5.11 MIL/uL Final  . Hemoglobin 04/17/2023 10.7 (L)  12.0 - 15.0 g/dL Final  . HCT 82/95/6213 32.8 (L)  36.0 - 46.0 % Final  . MCV 04/17/2023 92.7  80.0 - 100.0 fL Final  . MCH 04/17/2023 30.2  26.0 - 34.0 pg Final  . MCHC 04/17/2023 32.6  30.0 - 36.0 g/dL Final  . RDW 08/65/7846 16.3 (H)  11.5 - 15.5 % Final  . Platelets 04/17/2023 94 (L)  150 - 400 K/uL Final   Comment: SPECIMEN CHECKED FOR CLOTS Immature Platelet Fraction may be clinically indicated, consider ordering this additional test [id number] REPEATED TO VERIFY   . nRBC 04/17/2023 0.0  0.0 - 0.2 % Final  . Neutrophils Relative % 04/17/2023 56  % Final  . Neutro Abs 04/17/2023 2.8  1.7 - 7.7 K/uL Final  . Lymphocytes Relative 04/17/2023 24  % Final  . Lymphs Abs 04/17/2023 1.1  0.7 - 4.0 K/uL Final  .  Monocytes Relative 04/17/2023 15  % Final  . Monocytes Absolute 04/17/2023 0.7  0.1 - 1.0 K/uL Final  . Eosinophils Relative 04/17/2023 4  % Final  . Eosinophils Absolute 04/17/2023 0.2  0.0 - 0.5 K/uL Final  . Basophils Relative 04/17/2023 1  % Final  . Basophils Absolute 04/17/2023 0.0  0.0 - 0.1 K/uL Final  . Immature Granulocytes 04/17/2023 0  % Final  . Abs Immature Granulocytes 04/17/2023 0.01  0.00 - 0.07 K/uL Final   Performed at [facility], [address], [place], [place] [address]  . Sodium 04/17/2023 141  135 - 145 mmol/L Final  . Potassium 04/17/2023 4.4  3.5 - 5.1 mmol/L Final  . Chloride 04/17/2023 105  98 - 111 mmol/L Final  . CO2 04/17/2023 27  22 - 32 mmol/L Final  . Glucose, Bld 04/17/2023 105 (H)  70 - 99 mg/dL Final   Glucose reference range applies only to samples taken after fasting for at least 8 hours.  . BUN 04/17/2023 12  8 - 23 mg/dL Final  . Creatinine, Ser 04/17/2023 0.90  0.44 - 1.00 mg/dL Final  . Calcium 10/24/7251 9.3  8.9 - 10.3 mg/dL Final  . Total Protein 04/17/2023 6.2 (L)  6.5 - 8.1 g/dL Final  .  Albumin 04/17/2023 3.9  3.5 - 5.0 g/dL Final  . AST 16/07/9603 22  15 - 41 U/L Final  . ALT 04/17/2023 16  0 - 44 U/L Final  . Alkaline Phosphatase 04/17/2023 65  38 - 126 U/L Final  . Total Bilirubin 04/17/2023 0.6  0.3 - 1.2 mg/dL Final  . GFR, Estimated 04/17/2023 >60  >60 mL/min Final   Comment: (NOTE) Calculated using the CKD-EPI Creatinine Equation (2021)   . Anion gap 04/17/2023 9  5 - 15 Final   Performed at [facility], [address] [place], [place] [address]  Appointment on 04/03/2023  Component Date Value Ref Range Status  . WBC Count 04/03/2023 4.8  4.0 - 10.5 K/uL Final  . RBC 04/03/2023 4.13  3.87 - 5.11 MIL/uL Final  . Hemoglobin 04/03/2023 12.2  12.0 - 15.0 g/dL Final  . HCT 11/91/4782 37.8  36.0 - 46.0 % Final  . MCV 04/03/2023 91.5  80.0 - 100.0 fL Final  . MCH 04/03/2023 29.5  26.0 -  34.0 pg Final  . MCHC 04/03/2023 32.3  30.0 - 36.0 g/dL Final  . RDW 95/62/1308 15.6 (H)  11.5 - 15.5 % Final  . Platelet Count 04/03/2023 112 (L)  150 - 400 K/uL Final  . nRBC 04/03/2023 0.0  0.0 - 0.2 % Final  . Neutrophils Relative % 04/03/2023 57  % Final  . Neutro Abs 04/03/2023 2.7  1.7 - 7.7 K/uL Final  . Lymphocytes Relative 04/03/2023 28  % Final  . Lymphs Abs 04/03/2023 1.3  0.7 - 4.0 K/uL Final  . Monocytes Relative 04/03/2023 13  % Final  . Monocytes Absolute 04/03/2023 0.6  0.1 - 1.0 K/uL Final  . Eosinophils Relative 04/03/2023 2  % Final  . Eosinophils Absolute 04/03/2023 0.1  0.0 - 0.5 K/uL Final  . Basophils Relative 04/03/2023 0  % Final  . Basophils Absolute 04/03/2023 0.0  0.0 - 0.1 K/uL Final  . Immature Granulocytes 04/03/2023 0  % Final  . Abs Immature Granulocytes 04/03/2023 0.02  0.00 - 0.07 K/uL Final   Performed at [facility], [address], [place], [place] [address]  . Sodium 04/03/2023 138  135 - 145 mmol/L Final  . Potassium 04/03/2023 4.2  3.5 - 5.1 mmol/L Final  . Chloride 04/03/2023 105  98 - 111 mmol/L Final  . CO2 04/03/2023 25  22 - 32 mmol/L Final  . Glucose, Bld 04/03/2023 89  70 - 99 mg/dL Final   Glucose reference range applies only to samples taken after fasting for at least 8 hours.  . BUN 04/03/2023 18  8 - 23 mg/dL Final  . Creatinine 69/62/9528 0.74  0.44 - 1.00 mg/dL Final  . Calcium 41/32/4401 8.9  8.9 - 10.3 mg/dL Final  . Total Protein 04/03/2023 6.6  6.5 - 8.1 g/dL Final  . Albumin 02/72/5366 3.9  3.5 - 5.0 g/dL Final  . AST 44/12/4740 35  15 - 41 U/L Final  . ALT 04/03/2023 25  0 - 44 U/L Final  . Alkaline Phosphatase 04/03/2023 77  38 - 126 U/L Final  . Total Bilirubin 04/03/2023 1.1  0.3 - 1.2 mg/dL Final  . GFR, Estimated 04/03/2023 >60  >60 mL/min Final   Comment: (NOTE) Calculated using the CKD-EPI Creatinine Equation (2021)   . Anion gap 04/03/2023 8  5 - 15 Final   Performed at [facility], [address] [place], [place] [address]    RADIOGRAPHIC  STUDIES: I have personally reviewed the radiological images as listed and agree with the findings in the report  No results found.  ASSESSMENT/PLAN  67 y.o. female is here because of  colon cancer.  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 Mass was partially circumferential (involves < 1/3rd of the lumen circumference). 10 mm polyp in proximal ascending colon which was sessile.  Pathology ulcerated mass- invasive moderately differentiated adenocarcinoma arising within a tubular adenoma with high-grade dysplasia   January 15 2023- CEA 2.9 Chromogranin A 564  January 17 2023- CT CAP  Short segment asymmetric wall thickening of the transverse colon measuring 3.3 cm in length.   Small adjacent soft tissue nodule/lymph nodes adjacent to the colonic wall thickening measuring 6 mm, suspicious  for local nodal disease. Prominent/mildly enlarged right upper quadrant lymph nodes similar to January 26, 2019 and favored reactive.  No distant metastatic disease.  Hepatomegaly with nodular hepatic contour, suggestive of cirrhosis Cholelithiasis without findings of acute cholecystitis.   Incidental right thyroid nodule   January 23 2023- Chromogranin A 294  February 04 2023:  Transverse colectomy.  Pathology showed adenocarcinoma invading visceral peritoneum, Grade 3.  All of 30 lymph nodes negative for tumor.  MSI intact Feb 25 2023- On basis of spontaneous improvement in chromogranin A will hold on Dodatate PET.     Therapeutics-  Feb 25 2023- Reviewed [facility] treatment guidelines  with patient and husband.  They are agreeable to proceed with adjuvant CAPOX x 6 months.   Mar 11 2023- Cycle 1 XelOx Mar 20 2023- Tolerated cycle 1 well with only some mild cold neuropathy April 01 2023- Cycle 2 XelOx  April 22 2023: Cycle 3 XelOx.   April 30 2023- Experiencing mucositis, anorexia, nausea and diarrhea.    Chemotherapy induced nausea and diarrhea             April 30 2023-  Instructed patient to do the following-- Take Ondansetron 8 mg, four times daily.  Take Imodium 4 mg every 4 hrs for diarrhea Use the magic mouthwash every 4 hours while awake.  Arranged for patient to receive IVF and antiemetics.  To follow up in 2 days to assess progress  May 02 2023- To complete this course of Xeloda on July 15th.  Should improve with the week off between then and beginning of Cycle 4.  Anticipate dose reduction in Xeloda with Cycle 4.  Sx under better control today than at last visit due to better use of supportive care     Diarrhea:             January 15 2023- Not explained by the colon cancer.  No evidence of inflammatory bowel disease by colonoscopy.    Chromogranin A 564  January 23 2023- Repeat Chromogranin A improved  Feb 25 2023- Diarrhea and chromogranin A improved therefore holding on Dotatate PET/CT    Poor venous access:    Mar 11 2023- Port placement   Headaches Mar 07 2023- Improved following surgery likely related to a component of general anesthesia.  To see neurology  Possible cirrhosis  January 17 2023- Nodular liver contour noted on CT CAP  Cancer Staging  Colon cancer [facility]) Staging form: Colon and Rectum, AJCC 8th Edition - Clinical stage from 02/25/2023: Stage IIB (cT4a, cN0, cM0) - Signed by Loni Muse, MD on 02/25/2023 Histopathologic type: Adenocarcinoma, NOS Stage prefix: Initial diagnosis Total positive nodes: 0 Total nodes examined: 30 Histologic grade (G): G3 Histologic grading system: 4 grade system    No problem-specific Assessment & Plan  notes found for this encounter.    No orders of the defined types were placed in this encounter.   30  minutes was spent in patient care.  This included time spent preparing to see the patient (e.g., review of tests), obtaining and/or reviewing separately obtained history, counseling and educating the patient/family/caregiver, ordering medications, tests, or procedures; documenting clinical information in the electronic or other health record, independently interpreting results and communicating results to the patient/family/caregiver as well as coordination of care.       All questions were answered. The patient knows to call the clinic with any problems, questions or concerns.  This note was electronically signed.    Loni Muse, MD  05/02/2023 11:56 AM

## 2023-05-06 ENCOUNTER — Encounter: Payer: Self-pay | Admitting: Oncology

## 2023-05-06 ENCOUNTER — Other Ambulatory Visit: Payer: Self-pay | Admitting: Oncology

## 2023-05-06 DIAGNOSIS — K1231 Oral mucositis (ulcerative) due to antineoplastic therapy: Secondary | ICD-10-CM | POA: Insufficient documentation

## 2023-05-06 DIAGNOSIS — E861 Hypovolemia: Secondary | ICD-10-CM

## 2023-05-06 MED ORDER — LIDOCAINE VISCOUS HCL 2 % MT SOLN: 5.0000 mL | OROMUCOSAL | 0 refills | Status: DC | PRN

## 2023-05-07 ENCOUNTER — Telehealth: Payer: Self-pay | Admitting: Dietician

## 2023-05-07 NOTE — Telephone Encounter (Signed)
Patient screened on MST. First attempt to reach, voice mail was full. Provided my cell# in text message to return call to set up a nutrition consult.  Gennaro Africa, RDN, LDN Registered Dietitian, [facility] Part Time Remote (Usual office hours: Tuesday-Thursday) Cell: [phone]

## 2023-05-08 ENCOUNTER — Inpatient Hospital Stay: Payer: Medicare PPO

## 2023-05-08 ENCOUNTER — Inpatient Hospital Stay: Payer: Medicare PPO | Admitting: Oncology

## 2023-05-08 ENCOUNTER — Encounter: Payer: Self-pay | Admitting: Oncology

## 2023-05-08 ENCOUNTER — Other Ambulatory Visit: Payer: Self-pay

## 2023-05-08 ENCOUNTER — Other Ambulatory Visit (HOSPITAL_COMMUNITY): Payer: Self-pay

## 2023-05-08 VITALS — BP 111/55 | HR 85 | Temp 97.7°F | Resp 18 | Ht 65.9 in | Wt 225.8 lb

## 2023-05-08 VITALS — BP 98/57 | HR 75 | Temp 97.6°F | Resp 20

## 2023-05-08 DIAGNOSIS — R110 Nausea: Secondary | ICD-10-CM | POA: Insufficient documentation

## 2023-05-08 DIAGNOSIS — R197 Diarrhea, unspecified: Secondary | ICD-10-CM

## 2023-05-08 DIAGNOSIS — T451X5A Adverse effect of antineoplastic and immunosuppressive drugs, initial encounter: Secondary | ICD-10-CM

## 2023-05-08 DIAGNOSIS — Z5111 Encounter for antineoplastic chemotherapy: Secondary | ICD-10-CM | POA: Diagnosis not present

## 2023-05-08 DIAGNOSIS — E876 Hypokalemia: Secondary | ICD-10-CM

## 2023-05-08 DIAGNOSIS — R519 Headache, unspecified: Secondary | ICD-10-CM | POA: Diagnosis not present

## 2023-05-08 DIAGNOSIS — C184 Malignant neoplasm of transverse colon: Secondary | ICD-10-CM

## 2023-05-08 DIAGNOSIS — Z09 Encounter for follow-up examination after completed treatment for conditions other than malignant neoplasm: Secondary | ICD-10-CM

## 2023-05-08 DIAGNOSIS — K1231 Oral mucositis (ulcerative) due to antineoplastic therapy: Secondary | ICD-10-CM

## 2023-05-08 DIAGNOSIS — Z79899 Other long term (current) drug therapy: Secondary | ICD-10-CM | POA: Diagnosis not present

## 2023-05-08 LAB — COMPREHENSIVE METABOLIC PANEL
ALT: 20 U/L (ref 0–44)
AST: 29 U/L (ref 15–41)
Albumin: 3.8 g/dL (ref 3.5–5.0)
Alkaline Phosphatase: 62 U/L (ref 38–126)
Anion gap: 10 (ref 5–15)
BUN: 12 mg/dL (ref 8–23)
CO2: 26 mmol/L (ref 22–32)
Calcium: 8.8 mg/dL — ABNORMAL LOW (ref 8.9–10.3)
Chloride: 102 mmol/L (ref 98–111)
Creatinine, Ser: 0.86 mg/dL (ref 0.44–1.00)
GFR, Estimated: >60 mL/min (ref >60)
Glucose, Bld: 100 mg/dL — ABNORMAL HIGH (ref 70–99)
Potassium: 2.5 mmol/L — CL (ref 3.5–5.1)
Sodium: 138 mmol/L (ref 135–145)
Total Bilirubin: 1.3 mg/dL — ABNORMAL HIGH (ref 0.3–1.2)
Total Protein: 6.1 g/dL — ABNORMAL LOW (ref 6.5–8.1)

## 2023-05-08 LAB — MAGNESIUM: Magnesium: 1.9 mg/dL (ref 1.7–2.4)

## 2023-05-08 LAB — CBC WITH DIFFERENTIAL/PLATELET
Abs Immature Granulocytes: 0.01 10*3/uL (ref 0.00–0.07)
Basophils Absolute: 0 10*3/uL (ref 0.0–0.1)
Basophils Relative: 0 %
Eosinophils Absolute: 0.1 10*3/uL (ref 0.0–0.5)
Eosinophils Relative: 4 %
HCT: 29.7 % — ABNORMAL LOW (ref 36.0–46.0)
Hemoglobin: 10.2 g/dL — ABNORMAL LOW (ref 12.0–15.0)
Immature Granulocytes: 0 %
Lymphocytes Relative: 21 %
Lymphs Abs: 0.7 10*3/uL (ref 0.7–4.0)
MCH: 31.3 pg (ref 26.0–34.0)
MCHC: 34.3 g/dL (ref 30.0–36.0)
MCV: 91.1 fL (ref 80.0–100.0)
Monocytes Absolute: 0.8 10*3/uL (ref 0.1–1.0)
Monocytes Relative: 23 %
Neutro Abs: 1.7 10*3/uL (ref 1.7–7.7)
Neutrophils Relative %: 52 %
Platelets: 89 10*3/uL — ABNORMAL LOW (ref 150–400)
RBC: 3.26 MIL/uL — ABNORMAL LOW (ref 3.87–5.11)
RDW: 17.4 % — ABNORMAL HIGH (ref 11.5–15.5)
WBC: 3.3 10*3/uL — ABNORMAL LOW (ref 4.0–10.5)
nRBC: 0 % (ref 0.0–0.2)

## 2023-05-08 MED ORDER — HEPARIN SOD (PORK) LOCK FLUSH 100 UNIT/ML IV SOLN
500.0000 [IU] | Freq: Once | INTRAVENOUS | Status: AC | PRN
  Administered 2023-05-08: 500 [IU]

## 2023-05-08 MED ORDER — SODIUM CHLORIDE 0.9 % IV SOLN: Freq: Once | INTRAVENOUS | Status: AC

## 2023-05-08 MED ORDER — DIPHENOXYLATE-ATROPINE 2.5-0.025 MG PO TABS
1.0000 | ORAL_TABLET | Freq: Four times a day (QID) | ORAL | 1 refills | Status: DC | PRN
Start: 2023-05-08 — End: 2023-06-10

## 2023-05-08 MED ORDER — CAPECITABINE 500 MG PO TABS
1500.0000 mg | ORAL_TABLET | Freq: Two times a day (BID) | ORAL | 3 refills | Status: DC
Start: 2023-05-08 — End: 2023-06-10
  Filled 2023-05-08: qty 180, 30d supply, fill #0
  Filled 2023-05-08: qty 84, 21d supply, fill #0

## 2023-05-08 MED ORDER — ONDANSETRON HCL 4 MG/2ML IJ SOLN
8.0000 mg | Freq: Once | INTRAMUSCULAR | Status: AC
  Administered 2023-05-08: 8 mg via INTRAVENOUS
  Filled 2023-05-08: qty 4

## 2023-05-08 MED ORDER — POTASSIUM CHLORIDE CRYS ER 20 MEQ PO TBCR: EXTENDED_RELEASE_TABLET | ORAL | 0 refills | Status: DC

## 2023-05-08 MED ORDER — SODIUM CHLORIDE 0.9% FLUSH
10.0000 mL | Freq: Once | INTRAVENOUS | Status: AC | PRN
  Administered 2023-05-08: 10 mL

## 2023-05-08 NOTE — Patient Instructions (Signed)
Use Imodium 4 mg every 4 hrs as needed for diarrhea

## 2023-05-08 NOTE — Progress Notes (Signed)
CRITICAL VALUE STICKER  CRITICAL VALUE:    K+ 2.5  RECEIVER (on-site recipient of call):  Dyane Dustman RN  DATE & TIME NOTIFIED:   05/08/2023 @ 1431  MESSENGER (representative from lab):  [facility]  MD NOTIFIED:   Dr. Angelene Giovanni  TIME OF NOTIFICATION:  1435  RESPONSE:  Potassium q2hrs x 4 doses today, repeat labs on 05/09/2023 @ 11:00.

## 2023-05-08 NOTE — Progress Notes (Signed)
[facility] Cancer Initial Visit:  Patient Care Team: Hurshel Party, NP as PCP - General (Internal Medicine) Loni Muse, MD as Consulting Physician (Internal Medicine)  CHIEF COMPLAINTS/PURPOSE OF CONSULTATION:  Oncology History  Colon cancer [facility])  01/15/2023 Initial Diagnosis   Colon cancer (HCC)   02/25/2023 Cancer Staging   Staging form: Colon and Rectum, AJCC 8th Edition - Clinical stage from 02/25/2023: Stage IIB (cT4a, cN0, cM0) - Signed by Loni Muse, MD on 02/25/2023 Histopathologic type: Adenocarcinoma, NOS Stage prefix: Initial diagnosis Total positive nodes: 0 Total nodes examined: 30 Histologic grade (G): G3 Histologic grading system: 4 grade system   03/11/2023 -  Chemotherapy   Patient is on Treatment Plan : COLORECTAL Xelox (Capeox)(130/850) q21d       HISTORY OF PRESENTING ILLNESS: [name] 67 y.o. female is here because of  colon cancer Medical history notable for septic arthritis of the hip, obesity, atypical chest pain, skin cancer, carpal tunnel syndrome, chronic vaginitis, chronic venous and sufficiency, TTP, syncope  January 10, 2023: Colonoscopy-ulcerated 2 cm nonobstructing small mass with heaped up margins found in distal transverse colon.  Mass was partially circumferential (involves less than one third of the lumen circumference) no bleeding present.  10 mm polyp in proximal ascending colon which was sessile.  Pathology-Ascending colon polyp was compatible with a sessile serrated adenoma without cytologic dysplasia Transverse colon polyp-invasive moderately differentiated adenocarcinoma arising within a tubular adenoma with high-grade dysplasia  January 15 2023: [facility] Medical Oncology Consult Patient reports that the colonoscopy was performed for surveillance; however, she was also having episodes of intermittent diarrhea x 2 to 3 months.  Stools were not bloody, nor was she passing mucus but they were soft.  In  association with these episodes she would also feel dizzy and note an odd smell, nausea.   To see Dr. Logan Bores from surgery on January 22 2023   Social:  Married.  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  Tobacco quit 24 years.  Rare glass of wine  [facility] Mother alive 56 epileptic, dementia Father died 67 Alzheimers, prostate cancer Brother alive 46 arthritis requiring joint replacement,   WBC 6.4 hemoglobin 12.5 MCV 88 platelet count 178 normal differential CMP normal CEA 2.9 chromogranin A 564  January 17 2023:  CT CAP Short segment asymmetric wall thickening of the transverse colon measuring 3.3 cm in length, compatible with reported history of  primary colonic neoplasm.  Small adjacent soft tissue nodule/lymph nodes adjacent to the colonic wall thickening measuring 6 mm, nonspecific are suspicious  for local nodal disease. Prominent/mildly enlarged right upper quadrant lymph nodes are similar dating back to January 26, 2019 and favored reactive.  No convincing evidence of distant metastatic disease within the chest, abdomen or pelvis.  Hepatomegaly with nodular hepatic contour, suggestive of cirrhosis Cholelithiasis without findings of acute cholecystitis.   Incidental right thyroid nodule measuring   January 18 2023:  Presented to [facility] ED with headaches and chest discomfort.  CT PA negative for PE.  CT head without contrast negative.    January 22 2023:  Scheduled follow up for colon cancer.  Reviewed results of labs and imaging with patient and husband. Will repeat chromgranin A and refer for PET/CT if chromogranin A still significantly elevated.    January 23, 2023: Chromogranin A 294  February 04 2023:  Transverse colectomy Pathology showed adenocarcinoma invading visceral peritoneum, Grade 3.  All of 30 lymph nodes were negative for tumor.  Surgical Stage (T4a, N0 M0)  MSI intact  Feb 25, 2023:   Reviewed results of surgical pathology with patient and husband.  Per NCCN guidelines adjuvant  options are CAPEOX, FOLFOX 6 months, Capecitabine or observation.  Discussed risks and benefits of each  Mar 07 2023:   Bifrontal headaches have returned.  These began in October 2023 but resolved after her colon surgery.  Has seen neurology and underwent MRI brain negative for CNS lesion.  EEG normal.   (Raises the question of what medications in perioperative period helped with the HA's.)  Recovering from a dog bite on dorsum of right hand.  Will receive Capecitabine today.    Mar 11 2023:  Port placement.  Cycle 1 XelOx   Mar 19 2023:  Neurology follow up.    Mar 20 2023:   Agree with Neurology assessment that neurocognitive testing should not be performed it at all, until well after chemotherapy has been completed.  Experienced some mild nausea helped by antiemetics.   Has occasional HA's.  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   No HFS.     April 01 2023:  Cycle 2 XelOx  April 17 2023:  Has lost 4 lbs.  Appetite diminished due to dysgeusia.  No metallic taste.  Edges of tongue get red and sore and painful.  Mouth feels dry despite drinking a lot of water and tea.  Not using anything for her mouth sores.   Fatigue increased.  Has cold neuropathy and cold induced pharyngospasm.      Begin Magic mouthwash for mucositis.     April 22 2023:  Cycle 3 XelOx.    April 30 2023:    Patient called office yesterday stating that she was anorectic, nauseated, having mouth sores and diarrhea.  Has lost 13 lbs.  Has difficulty remembering details which complicates history taking.  States that anorexia began after starting this round of chemotherapy.  "Food does not get past my tongue"; did not recognize this was nausea so did not begin taking antiemetics until 3 days ago.  Only taking antiemetics bid.  Mouth feels dry but not painful.  No emesis.  Began with diarrhea in the form of 5 to 6 stools per day but does not believe that she is taking imodium for it.  Dysgeusia  even to water.   Not using magic mouthwash regularly  May 02 2023:    Diarrhea improved with imodium.  Using ondansetron alternating with compazine; no emesis with improvement in nausea.  Eating limited by mucositis which she can't say is helped by magic mouthwash.  Not eating cold foods.  Has gained 2 lbs in the last two days.  To complete the course of Xeloda on May 06 2023.    WBC 4.7 hemoglobin 11.6 platelet count 61; 60 segs 23 lymphs 13 monos 4 eos CMP notable for potassium 3.4 glucose 103  May 08 2023:   Scheduled follow-up regarding colon cancer. Has lost 4 lbs since last visit owing to anorexia.   No mouth sores but has dysgeusia and mouth feels dry.  Using magic mouthwash.  Nauseated but without emesis.  Continues to have 5 to 6 bowel movements daily.  Using imodium 2 mg q 5 hrs.    Instructed patient to use imodium 4 mg po q 4 hrs PRN diarrhea Adding lomotil up to qid PRN Xeloda has not been shipped to patient.   Will dose reduce Xeloda from 2000 mg bid to  1500 mg bid.    May 13 2023:  Cycle 4 XelOX  Review of Systems  Constitutional:  Negative for appetite change, chills, fatigue, fever and unexpected weight change.       Has gained weight due to change in diet  HENT:   Negative for mouth sores, nosebleeds, sore throat, tinnitus and trouble swallowing.   Eyes:  Negative for eye problems and icterus.       Vision changes:  None  Respiratory:  Negative for chest tightness, hemoptysis, shortness of breath and wheezing.        One month of nonproductive cough.  No history of seasonable allergies  Cardiovascular:  Negative for chest pain and leg swelling.       Intermittent palpitations.  Chronic LE edema  Endocrine: Negative for hot flashes.       Cold intolerance:  none Heat intolerance:  none  Genitourinary:  Negative for bladder incontinence, difficulty urinating, dysuria, frequency, hematuria and nocturia.   Musculoskeletal:  Negative for arthralgias, back pain, gait  problem, myalgias, neck pain and neck stiffness.  Skin:  Negative for itching, rash and wound.  Neurological:  Negative for dizziness, extremity weakness, gait problem, headaches, numbness, seizures and speech difficulty.  Hematological:  Negative for adenopathy. Bruises/bleeds easily.  Psychiatric/Behavioral:  Negative for sleep disturbance and suicidal ideas. The patient is not nervous/anxious.     MEDICAL HISTORY: Past Medical History:  Diagnosis Date   Abnormal glucose    Achilles tendinitis of left lower extremity    Acute suppurative arthritis due to bacteria (HCC) 03/09/2011   Overview:  Last Assessment & Plan:  Methicillin sensitive staphylococcal aureus septic hip. She clearly does need I&D of the hip. I will have her continue the doxycycline for now but stop it 7 days prior to her surgery to maximize the yield on the cultures in the operating room though I be shocked if we don't find methicillin sensitive staph aureus again.. I agree with removing as much of the pros   Anal fissure 11/30/2015   Arthritis    right hip   Atypical chest pain 04/09/2016   Bilateral edema of lower extremity    BMI 39.0-39.9,adult    Cancer [facility])    SKIN CANCER   Carpal tunnel syndrome 03/09/2011   Overview:  Last Assessment & Plan:  Clinically this seems to be carpal tunnel syndrome. Giving given her history of infection though we can keep in the back of our mind the idea that she will have a cervical spine problem but I think this is unlikely at this point in time medics carpal tunnel syndrome fits clinically this was a positive Tinel's sign however in for a brace for her.   Chronic vaginitis    Chronic venous insufficiency    Depression    Dyslipidemia    Fatigue 10/13/2015   Fibromyalgia    GERD without esophagitis    History of immune thrombocytopenia 03/09/2011   History of operative procedure on hip 03/10/2012   History of TTP (thrombotic thrombocytopenic purpura)    Hypokalemia     Lichen sclerosus et atrophicus of the vulva 11/30/2015   Major depressive disorder, recurrent, moderate (HCC)    Methicillin susceptible Staphylococcus aureus infection 03/09/2011   Overview:  Last Assessment & Plan:  This is undoubtedlythe culprit organism   Migraine    Morbid obesity with BMI of 45.0-49.9, adult (HCC) 10/13/2015   Morbidly obese (HCC)    MSSA (methicillin susceptible Staphylococcus aureus) infection    Near  syncope    Osteoarthritis, chronic    Pain due to total hip replacement (HCC) 09/20/2011   Palpitations 03/04/2019   Postmenopausal    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 syndrome (HCC)    20 yrs ago-not seen anyone for 10 yrs.   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 Mild diverticulosis. Small internal hemorrhoids. Healed anal fissure   ENDOVENOUS ABLATION SAPHENOUS VEIN W/ LASER Right 04/02/2019   endovenous laser ablation right greater saphenous vein by Waverly Ferrari MD    JOINT REPLACEMENT  08/2009   right hip replacement   REVISION TOTAL HIP ARTHROPLASTY     TONSILLECTOMY  1963   TOTAL HIP REVISION  10/08/2011   Procedure: TOTAL HIP REVISION;  Surgeon: [name];  Location: [facility];  Service: Orthopedics;  Laterality: Right;  Resection of Right Total Hip/Extended Trochanteric Osteotomy/Placement of Antibiotic Total Hip Cemented by Depuy   TOTAL HIP REVISION  03/10/2012   Procedure: TOTAL HIP REVISION;  Surgeon: [name], MD;  Location: [facility];  Service: Orthopedics;  Laterality: Right;  Reimplantation/Revision of a Right Total Hip and Removal of Cemented Implant    SOCIAL HISTORY: Social History   Socioeconomic History   Marital status: Married    Spouse name: Raiford Noble   Number of children: Not on file   Years  of education: 12+7   Highest education level: Bachelor's degree (e.g., BA, AB, BS)  Occupational History   Occupation: Magazine features editor: [employer]  Tobacco Use   Smoking status: Former    Current packs/day: 0.00    Average packs/day: 1.5 packs/day for 20.0 years (30.0 ttl pk-yrs)    Types: Cigarettes    Start date: 10/04/1970    Quit date: 10/04/1990    Years since quitting: 32.6   Smokeless tobacco: Former    Quit date: 03/09/1991  Vaping Use   Vaping status: Never Used  Substance and Sexual Activity   Alcohol use: Yes    Comment: OCCASIONAL   Drug use: No   Sexual activity: Yes    Partners: Male  Other Topics Concern   Not on file  Social History Narrative   Not on file   Social Determinants of Health   Financial Resource Strain: Not on file  Food Insecurity: Low Risk  (02/04/2023)   [facility]   Food vital sign    Within the past 12 months, you worried that your food would run out before you got money to buy more: Never true    Within the past 12 months, the food you bought just didn't last and you didn't have money to get more. : Never true  Transportation Needs: Not on file (02/04/2023)  Physical Activity: Not on file  Stress: Not on file  Social Connections: Not on file  Intimate Partner Violence: Not on file    FAMILY HISTORY Family History  Problem Relation Age of Onset   Hypertension Mother    Hypertension Father    Alzheimer's disease Father    Prostate cancer Father    Hypertension Maternal Grandfather    Seizures Other    Colon cancer Neg Hx    Stomach cancer Neg Hx    Rectal cancer Neg Hx  Esophageal cancer Neg Hx     ALLERGIES:  has No Known Allergies.  MEDICATIONS:  Current Outpatient Medications  Medication Sig Dispense Refill   aluminum-magnesium hydroxide 200-200 MG/5ML suspension Take 15 mLs by mouth every 6 (six) hours as needed for indigestion.     AMBULATORY NON FORMULARY MEDICATION  Medication Name: Magic Mouth Wash 3 parts Mylanta/Maalox 2 parts Benadryl 1 part Viscous Lidocaine  Swish and Swallow 5 mL every 3-4 hours as needed 240 mL 0   buPROPion (WELLBUTRIN XL) 150 MG 24 hr tablet Take 150 mg by mouth every evening.     capecitabine (XELODA) 500 MG tablet Take 4 tablets (2,000 mg total) by mouth 2 (two) times daily after a meal. Take 14 days on, then 7 days off, repeat every 21 days. 112 tablet 7   clobetasol cream (TEMOVATE) 0.05 % Apply 1 Application topically as needed.     clotrimazole-betamethasone (LOTRISONE) cream Apply 1 Application topically 2 (two) times daily.     divalproex (DEPAKOTE) 250 MG DR tablet Take 250 mg by mouth 2 (two) times daily.     DULoxetine (CYMBALTA) 60 MG capsule Take 60 mg by mouth at bedtime.      esomeprazole (NEXIUM) 40 MG capsule Take 40 mg by mouth daily. Pt states has been on for years     ibuprofen (ADVIL) 200 MG tablet Take 200 mg by mouth every 6 (six) hours as needed.     magic mouthwash (lidocaine, diphenhydrAMINE, alum & mag hydroxide) suspension Swish and swallow 5 mLs every 4 (four) hours as needed for mouth pain. 240 mL 0   magic mouthwash (lidocaine, diphenhydrAMINE, alum & mag hydroxide) suspension Swish and swallow 5 mLs every 4 (four) hours as needed for mouth pain. 240 mL 0   meloxicam (MOBIC) 7.5 MG tablet Take 7.5 mg by mouth every morning.     Multiple Vitamin (MULTIVITAMIN WITH MINERALS) TABS tablet Take 1 tablet by mouth daily.     nortriptyline (PAMELOR) 10 MG capsule Take 10 mg by mouth.     ondansetron (ZOFRAN) 8 MG tablet Take 1 tablet by mouth every 8 hours as needed for nausea or vomiting. Start on the third day after chemotherapy. 30 tablet 1   oxyCODONE (OXY IR/ROXICODONE) 5 MG immediate release tablet SMARTSIG:5 Milligram(s) By Mouth Every 4 Hours PRN     prochlorperazine (COMPAZINE) 10 MG tablet Take 1 tablet (10 mg total) by mouth every 6 (six) hours as needed for nausea or vomiting. 30 tablet 1    SUMAtriptan (IMITREX) 25 MG tablet Take 25 mg by mouth.     Current Facility-Administered Medications  Medication Dose Route Frequency Provider Last Rate Last Admin   triamcinolone acetonide (KENALOG-40) injection 20 mg  20 mg Other Once Asencion Islam, DPM        PHYSICAL EXAMINATION:  ECOG PERFORMANCE STATUS: 1 - Symptomatic but completely ambulatory   Vitals:   05/08/23 1045  BP: (!) 111/55  Pulse: 85  Resp: 18  Temp: 97.7 F (36.5 C)  SpO2: 98%     Filed Weights   05/08/23 1045  Weight: 225 lb 12.8 oz (102.4 kg)      Physical Exam Vitals and nursing note reviewed.  Constitutional:      General: She is not in acute distress.    Appearance: Normal appearance. She is obese. She is not ill-appearing, toxic-appearing or diaphoretic.     Comments: Here alone.    HENT:     Head: Normocephalic and atraumatic.  Right Ear: External ear normal.     Left Ear: External ear normal.     Nose: Nose normal. No congestion or rhinorrhea.  Eyes:     General: No scleral icterus.    Extraocular Movements: Extraocular movements intact.     Conjunctiva/sclera: Conjunctivae normal.     Pupils: Pupils are equal, round, and reactive to light.  Cardiovascular:     Rate and Rhythm: Normal rate.     Heart sounds: No murmur heard.    No friction rub. No gallop.  Abdominal:     General: Bowel sounds are normal.     Palpations: Abdomen is soft.     Tenderness: There is no abdominal tenderness. There is no guarding or rebound.  Musculoskeletal:        General: No swelling, tenderness or deformity.     Cervical back: Normal range of motion and neck supple. No rigidity or tenderness.  Lymphadenopathy:     Head:     Right side of head: No submental, submandibular, tonsillar, preauricular, posterior auricular or occipital adenopathy.     Left side of head: No submental, submandibular, tonsillar, preauricular, posterior auricular or occipital adenopathy.     Cervical: No cervical  adenopathy.     Right cervical: No superficial, deep or posterior cervical adenopathy.    Left cervical: No superficial, deep or posterior cervical adenopathy.     Upper Body:     Right upper body: No supraclavicular, axillary, pectoral or epitrochlear adenopathy.     Left upper body: No supraclavicular, axillary, pectoral or epitrochlear adenopathy.  Skin:    General: Skin is warm.     Coloration: Skin is not jaundiced.  Neurological:     General: No focal deficit present.     Mental Status: She is alert and oriented to person, place, and time. Mental status is at baseline.     Cranial Nerves: No cranial nerve deficit.  Psychiatric:        Mood and Affect: Mood normal.        Behavior: Behavior normal.        Thought Content: Thought content normal.        Judgment: Judgment normal.      LABORATORY DATA: I have personally reviewed the data as listed:  Appointment on 04/30/2023  Component Date Value Ref Range Status   Sodium 04/30/2023 138  135 - 145 mmol/L Final   Potassium 04/30/2023 3.4 (L)  3.5 - 5.1 mmol/L Final   Chloride 04/30/2023 104  98 - 111 mmol/L Final   CO2 04/30/2023 23  22 - 32 mmol/L Final   Glucose, Bld 04/30/2023 103 (H)  70 - 99 mg/dL Final   Glucose reference range applies only to samples taken after fasting for at least 8 hours.   BUN 04/30/2023 19  8 - 23 mg/dL Final   Creatinine, Ser 04/30/2023 0.92  0.44 - 1.00 mg/dL Final   Calcium 59/56/3875 9.1  8.9 - 10.3 mg/dL Final   Total Protein 64/33/2951 6.9  6.5 - 8.1 g/dL Final   Albumin 88/41/6606 4.1  3.5 - 5.0 g/dL Final   AST 30/16/0109 32  15 - 41 U/L Final   ALT 04/30/2023 21  0 - 44 U/L Final   Alkaline Phosphatase 04/30/2023 63  38 - 126 U/L Final   Total Bilirubin 04/30/2023 1.1  0.3 - 1.2 mg/dL Final   GFR, Estimated 04/30/2023 >60  >60 mL/min Final   Comment: (NOTE) Calculated using the CKD-EPI Creatinine Equation (2021)  Anion gap 04/30/2023 11  5 - 15 Final   Performed at [facility], [address], [place], [place] [address]   WBC 04/30/2023 4.7  4.0 - 10.5 K/uL Final   RBC 04/30/2023 3.79 (L)  3.87 - 5.11 MIL/uL Final   Hemoglobin 04/30/2023 11.6 (L)  12.0 - 15.0 g/dL Final   HCT 40/98/1191 34.1 (L)  36.0 - 46.0 % Final   MCV 04/30/2023 90.0  80.0 - 100.0 fL Final   MCH 04/30/2023 30.6  26.0 - 34.0 pg Final   MCHC 04/30/2023 34.0  30.0 - 36.0 g/dL Final   RDW 47/82/9562 16.8 (H)  11.5 - 15.5 % Final   Platelets 04/30/2023 61 (L)  150 - 400 K/uL Final   Comment: Immature Platelet Fraction may be clinically indicated, consider ordering this additional test [id number] REPEATED TO VERIFY PLATELET COUNT CONFIRMED BY SMEAR    nRBC 04/30/2023 0.0  0.0 - 0.2 % Final   Neutrophils Relative % 04/30/2023 60  % Final   Neutro Abs 04/30/2023 2.8  1.7 - 7.7 K/uL Final   Lymphocytes Relative 04/30/2023 23  % Final   Lymphs Abs 04/30/2023 1.1  0.7 - 4.0 K/uL Final   Monocytes Relative 04/30/2023 13  % Final   Monocytes Absolute 04/30/2023 0.6  0.1 - 1.0 K/uL Final   Eosinophils Relative 04/30/2023 4  % Final   Eosinophils Absolute 04/30/2023 0.2  0.0 - 0.5 K/uL Final   Basophils Relative 04/30/2023 0  % Final   Basophils Absolute 04/30/2023 0.0  0.0 - 0.1 K/uL Final   Immature Granulocytes 04/30/2023 0  % Final   Abs Immature Granulocytes 04/30/2023 0.02  0.00 - 0.07 K/uL Final   Performed at [facility], [address], [place], [place] [address]   Magnesium 04/30/2023 1.9  1.7 - 2.4 mg/dL Final   Performed at [facility], [address] [place], [place] [address]  Office Visit on 04/17/2023  Component Date Value Ref Range Status   WBC 04/17/2023 4.9  4.0 - 10.5 K/uL Final   RBC 04/17/2023 3.54 (L)  3.87 - 5.11 MIL/uL Final   Hemoglobin 04/17/2023 10.7 (L)  12.0 - 15.0 g/dL Final   HCT 13/24/4010 32.8 (L)  36.0 - 46.0 % Final   MCV 04/17/2023 92.7  80.0 - 100.0 fL Final   MCH 04/17/2023 30.2  26.0 - 34.0 pg  Final   MCHC 04/17/2023 32.6  30.0 - 36.0 g/dL Final   RDW 27/25/3664 16.3 (H)  11.5 - 15.5 % Final   Platelets 04/17/2023 94 (L)  150 - 400 K/uL Final   Comment: SPECIMEN CHECKED FOR CLOTS Immature Platelet Fraction may be clinically indicated, consider ordering this additional test [id number] REPEATED TO VERIFY    nRBC 04/17/2023 0.0  0.0 - 0.2 % Final   Neutrophils Relative % 04/17/2023 56  % Final   Neutro Abs 04/17/2023 2.8  1.7 - 7.7 K/uL Final   Lymphocytes Relative 04/17/2023 24  % Final   Lymphs Abs 04/17/2023 1.1  0.7 - 4.0 K/uL Final   Monocytes Relative 04/17/2023 15  % Final   Monocytes Absolute 04/17/2023 0.7  0.1 - 1.0 K/uL Final   Eosinophils Relative 04/17/2023 4  % Final   Eosinophils Absolute 04/17/2023 0.2  0.0 - 0.5 K/uL Final   Basophils Relative 04/17/2023 1  % Final   Basophils Absolute 04/17/2023 0.0  0.0 - 0.1 K/uL Final   Immature Granulocytes 04/17/2023 0  %  Final   Abs Immature Granulocytes 04/17/2023 0.01  0.00 - 0.07 K/uL Final   Performed at [facility], [address] [place], [place] [address]   Sodium 04/17/2023 141  135 - 145 mmol/L Final   Potassium 04/17/2023 4.4  3.5 - 5.1 mmol/L Final   Chloride 04/17/2023 105  98 - 111 mmol/L Final   CO2 04/17/2023 27  22 - 32 mmol/L Final   Glucose, Bld 04/17/2023 105 (H)  70 - 99 mg/dL Final   Glucose reference range applies only to samples taken after fasting for at least 8 hours.   BUN 04/17/2023 12  8 - 23 mg/dL Final   Creatinine, Ser 04/17/2023 0.90  0.44 - 1.00 mg/dL Final   Calcium 19/14/7829 9.3  8.9 - 10.3 mg/dL Final   Total Protein 56/21/3086 6.2 (L)  6.5 - 8.1 g/dL Final   Albumin 57/84/6962 3.9  3.5 - 5.0 g/dL Final   AST 95/28/4132 22  15 - 41 U/L Final   ALT 04/17/2023 16  0 - 44 U/L Final   Alkaline Phosphatase 04/17/2023 65  38 - 126 U/L Final   Total Bilirubin 04/17/2023 0.6  0.3 - 1.2 mg/dL Final   GFR, Estimated 04/17/2023 >60  >60 mL/min Final   Comment:  (NOTE) Calculated using the CKD-EPI Creatinine Equation (2021)    Anion gap 04/17/2023 9  5 - 15 Final   Performed at [facility], [address], [place], [place] [address]    RADIOGRAPHIC STUDIES: I have personally reviewed the radiological images as listed and agree with the findings in the report  No results found.  ASSESSMENT/PLAN  67 y.o. female is here because of  colon cancer.  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 Mass was partially circumferential (involves < 1/3rd of the lumen circumference). 10 mm polyp in proximal ascending colon which was sessile.  Pathology ulcerated mass- invasive moderately differentiated adenocarcinoma arising within a tubular adenoma with high-grade dysplasia   January 15 2023- CEA 2.9 Chromogranin A 564  January 17 2023- CT CAP  Short segment asymmetric wall thickening of the transverse colon measuring 3.3 cm in length.   Small adjacent soft tissue nodule/lymph nodes adjacent to the colonic wall thickening measuring 6 mm, suspicious  for local nodal disease. Prominent/mildly enlarged right upper quadrant lymph nodes similar to January 26, 2019 and favored reactive.  No distant metastatic disease.  Hepatomegaly with nodular hepatic contour, suggestive of cirrhosis Cholelithiasis without findings of acute cholecystitis.   Incidental right thyroid nodule   January 23 2023- Chromogranin A 294  February 04 2023:  Transverse colectomy.  Pathology showed adenocarcinoma invading visceral peritoneum, Grade 3.  All of 30 lymph nodes negative for tumor.  MSI intact Feb 25 2023- On  basis of spontaneous improvement in chromogranin A will hold on Dodatate PET.     Therapeutics-  Feb 25 2023- Reviewed [facility] treatment guidelines with patient and husband.  They are agreeable to proceed with adjuvant CAPOX x 6 months.   Mar 11 2023- Cycle 1 XelOx Mar 20 2023- Tolerated cycle 1 well with only some mild cold neuropathy April 01 2023- Cycle 2  XelOx   April 22 2023: Cycle 3 XelOx.              April 30 2023- Experiencing mucositis, anorexia, nausea and diarrhea.    May 13 2023: Cycle 4 XelOX Will dose reduce Xeloda from 2000 mg bid to 1500 mg bid due to GI symptoms.  This will be her last cycle of adjuvant therapy   Chemotherapy induced nausea and diarrhea             April 30 2023-  Instructed patient to do the following-- Take Ondansetron 8 mg, four times daily.  Take Imodium 4 mg every 4 hrs for diarrhea Use the magic mouthwash every 4 hours while awake.  Arranged for patient to receive IVF and antiemetics.  To follow up in 2 days to assess progress     May 02 2023- To complete this course of Xeloda on July 15th.  Should improve with the week off between then and beginning of Cycle 4.  Anticipate dose reduction in Xeloda with Cycle 4.  Sx under better control today than at last visit due to better use of supportive care May 08 2023-  Continues to have 5 to 6 bowel movements daily.  Using imodium 2 mg q 5 hrs.  Instructed patient to use imodium 4 mg po q 4 hrs PRN diarrhea Adding lomotil up to qid PRN.  Has required potassium replacement due to diarrheal losses     Diarrhea:             January 15 2023- Not explained by the colon cancer.  No evidence of inflammatory bowel disease by colonoscopy.    Chromogranin A 564  January 23 2023- Repeat Chromogranin A improved  Feb 25 2023- Diarrhea and chromogranin A improved therefore holding on Dotatate PET/CT    Poor venous access:    Mar 11 2023- Port placement   Headaches Mar 07 2023- Improved following surgery likely related to a  component of general anesthesia.  To see neurology  Possible cirrhosis  January 17 2023- Nodular liver contour noted on CT CAP    Cancer Staging  Colon cancer [facility]) Staging form: Colon and Rectum, AJCC 8th Edition - Clinical stage from 02/25/2023: Stage IIB (cT4a, cN0, cM0) - Signed by Loni Muse, MD on 02/25/2023 Histopathologic type: Adenocarcinoma, NOS Stage prefix: Initial diagnosis Total positive nodes: 0 Total nodes examined: 30 Histologic grade (G): G3 Histologic grading system: 4 grade system    No problem-specific Assessment & Plan notes found for this encounter.    No orders of the defined types were placed in this encounter.   40  minutes was spent in patient care.  This included time spent preparing to see the patient (e.g., review of tests), obtaining and/or reviewing separately obtained history, counseling and educating the patient/family/caregiver, ordering medications, tests, or procedures; documenting clinical information in the electronic or other health record, independently interpreting results and communicating results to the patient/family/caregiver as well as coordination of care.       All questions were answered. The patient knows to call the clinic with any problems, questions or concerns.  This note was electronically signed.    Loni Muse, MD  05/08/2023 11:08 AM

## 2023-05-08 NOTE — Patient Instructions (Signed)
Managing Chemotherapy Side Effects, Adult Chemotherapy is a treatment that uses medicine to kill cancer cells. However, in addition to killing cancer cells, the medicines can also damage healthy cells. The damage to healthy cells can lead to side effects. The exact side effects depend on the specific medicines used. Most of the side effects of chemotherapy go away once treatment is finished. Until then, work closely with your health care providers and take an active role in managing your side effects. What are common side effects of chemotherapy? Increased risk of infection, bruising, or bleeding. Nausea and vomiting. Constipation or diarrhea. Loss of appetite. Hair loss. Mouth or throat sores. Tiredness (fatigue). Tingling, pain, or numbness in the hands and feet. Dry, sensitive, itchy, or sore skin. Sleep disturbances, such as excessive sleepiness. Confusion, anxiety, or mood swings. Memory changes. How to manage the side effects of chemotherapy Medicines Take over-the-counter and prescription medicines only as told by your health care provider. Talk with your health care provider before taking vitamins, herbs, supplements, or over-the-counter medicines. Some of these can interfere with chemotherapy. Activity Get plenty of rest. Get regular exercise by doing activities such as walking, gentle yoga, or tai chi. Return to your normal activities as told by your health care provider. Ask your health care provider what activities are safe for you. Eating and drinking  Talk to a dietitian about what you should eat and drink during cancer treatment. Drink enough fluid to keep your urine pale yellow. If you have side effects that affect eating, these tips may help: Eat smaller meals and snacks often. Drink high-nutrition and high-calorie shakes or supplements. Choose bland and soft foods that are easy to eat. Do not eat foods that are hot, spicy, or hard to swallow. Do not eat raw or  undercooked meat, eggs, or seafood. Always wash fresh fruits and vegetables well before eating them. Skin care If you have sore or itchy skin: Wear soft, comfortable clothing. Apply creams and ointments to your skin as told by your health care provider. If you lose your hair, consider wearing a wig, hat, or scarf to cover your head. You may want to have someone shave your head as you start to lose hair. During outdoor activities, protect your head and skin from the sun by using sunscreen with an SPF of 30 or higher or by wearing protective clothing and a hat. Meet with a hair and skin care specialist for makeup and skin care tips. Apply sunscreen to your scalp as told by your health care provider. General tips Learn as much as you can about your condition. If you are struggling emotionally, talk with a mental health care provider or join a support group. Keep all follow-up visits. This is important. How to prevent infection and bleeding Chemotherapy may lower your blood counts and put you at risk for infection and bleeding. Here are some ways to help prevent problems. Vaccines Talk to your health care provider about vaccines. You should not get any live vaccines, such as the polio, MMR, chickenpox, and shingles vaccines until your health care provider says that it is safe to do so. Do not be around people who have had live vaccines for as long as your health care provider recommends. Make sure you get a yearly flu shot. People who will be near you should also get a yearly flu shot. Social activity Stay away from crowded places where you could be exposed to germs. Do not be around people who may be sick or   people who have symptoms of a fever until they have been fever-free for at least 24 hours. Do not share food, cups, straws, or utensils with other people. Wear a mask when outside the home if your blood counts are low. Cleanliness  Wash your hands often for at least 20 seconds. Also make  sure that other members of your household wash their hands often. Brush your teeth twice daily using a soft toothbrush. Use mouth rinse only as told by your health care provider. Take a bath or shower daily unless your health care provider gives different instructions. General tips Take your temperature regularly, especially if you have chills or feel warm. Check with your health care provider: Before you travel. Before you have a dental procedure. Before you use a swimming pool, hot tub, or swim in a lake or ocean. If you get chemotherapy through an IV or port, check the site every day for signs of infection. Check for redness, swelling, pain, fluid, and warmth. Avoid activities that put you at risk for injury or bleeding. Use an electric razor to shave instead of a blade. Questions to ask your health care provider What are the most common side effects of my treatment? How will they affect my daily life? What can I do to manage them? What are some possible long-term side effects? What are possible complications? What support services are available? What number can I call with questions or concerns? Where to find support Cancer affects the entire family. Find out what family support resources are available from your cancer treatment center. For more support, turn to: Your cancer care team. Friends and family. Your religious community. Other people with cancer. Community-based or online support groups. Where to find more information National Cancer Institute: www.cancer.gov American Cancer Society: www.cancer.org Contact a health care provider if: You bleed or bruise more often. You notice blood in your urine or stool. You have any of these symptoms: A skin rash, or dry or itchy skin. A headache or stiff neck. Cold or flu symptoms. A cough. Persistent nausea or vomiting. Persistent diarrhea. Frequent urination, burning when passing urine, or foul-smelling urine. You cannot eat  because of mouth or throat pain. You are sad, confused, anxious, or depressed. Get help right away if: You have any of these symptoms: A fever or chills. Your health care provider should know about this right away. Redness, swelling, pain, fluid, or warmth near an IV site. Bleeding that you cannot stop. A seizure. You cannot swallow. You have chest pain. You have trouble breathing. A family member or caregiver should get help right away if you have a sudden or unusual change in behavior. These symptoms may be an emergency. Get help right away. Call 911. Do not wait to see if the symptoms will go away. Do not drive yourself to the hospital. Summary Chemotherapy is a treatment that uses medicine to kill cancer cells and can cause side effects. The specific side effects depend on the specific medicines used. Learn as much as you can about your condition. Ask about side effects to watch for and how to treat them. Seek out support and resources from others. Find out what family support resources are available from your cancer treatment center. Let your health care provider know if you notice any new, unusual, or worsening symptoms, especially fever or chills. This information is not intended to replace advice given to you by your health care provider. Make sure you discuss any questions you have with your health   care provider. Document Revised: 09/28/2021 Document Reviewed: 09/28/2021 Elsevier Patient Education  2024 Elsevier Inc.  

## 2023-05-09 ENCOUNTER — Telehealth: Payer: Self-pay | Admitting: Dietician

## 2023-05-09 ENCOUNTER — Inpatient Hospital Stay: Payer: Medicare PPO

## 2023-05-09 ENCOUNTER — Other Ambulatory Visit: Payer: Self-pay

## 2023-05-09 DIAGNOSIS — Z5111 Encounter for antineoplastic chemotherapy: Secondary | ICD-10-CM | POA: Diagnosis not present

## 2023-05-09 DIAGNOSIS — Z79899 Other long term (current) drug therapy: Secondary | ICD-10-CM | POA: Diagnosis not present

## 2023-05-09 DIAGNOSIS — E876 Hypokalemia: Secondary | ICD-10-CM

## 2023-05-09 DIAGNOSIS — R197 Diarrhea, unspecified: Secondary | ICD-10-CM | POA: Diagnosis not present

## 2023-05-09 DIAGNOSIS — R519 Headache, unspecified: Secondary | ICD-10-CM | POA: Diagnosis not present

## 2023-05-09 DIAGNOSIS — C184 Malignant neoplasm of transverse colon: Secondary | ICD-10-CM | POA: Diagnosis not present

## 2023-05-09 LAB — POTASSIUM: Potassium: 3 mmol/L — ABNORMAL LOW (ref 3.5–5.1)

## 2023-05-09 LAB — MAGNESIUM: Magnesium: 1.7 mg/dL (ref 1.7–2.4)

## 2023-05-09 MED ORDER — POTASSIUM CHLORIDE CRYS ER 20 MEQ PO TBCR: EXTENDED_RELEASE_TABLET | ORAL | 0 refills | Status: DC

## 2023-05-09 NOTE — Telephone Encounter (Signed)
Patient has been scheduled for nutritional consult with Doyce Para. Aware of appt date and time

## 2023-05-10 ENCOUNTER — Encounter: Payer: Self-pay | Admitting: Oncology

## 2023-05-10 MED FILL — Dexamethasone Sodium Phosphate Inj 100 MG/10ML: INTRAMUSCULAR | Qty: 1 | Status: AC

## 2023-05-13 ENCOUNTER — Encounter: Payer: Self-pay | Admitting: Oncology

## 2023-05-13 ENCOUNTER — Inpatient Hospital Stay: Payer: Medicare PPO

## 2023-05-13 ENCOUNTER — Other Ambulatory Visit: Payer: Self-pay | Admitting: Pharmacist

## 2023-05-13 VITALS — BP 115/69 | HR 88 | Temp 97.4°F | Resp 14 | Ht 65.9 in | Wt 225.1 lb

## 2023-05-13 DIAGNOSIS — D649 Anemia, unspecified: Secondary | ICD-10-CM | POA: Diagnosis not present

## 2023-05-13 DIAGNOSIS — R197 Diarrhea, unspecified: Secondary | ICD-10-CM

## 2023-05-13 DIAGNOSIS — M542 Cervicalgia: Secondary | ICD-10-CM | POA: Diagnosis not present

## 2023-05-13 DIAGNOSIS — C189 Malignant neoplasm of colon, unspecified: Secondary | ICD-10-CM | POA: Diagnosis not present

## 2023-05-13 DIAGNOSIS — E861 Hypovolemia: Secondary | ICD-10-CM

## 2023-05-13 DIAGNOSIS — R519 Headache, unspecified: Secondary | ICD-10-CM | POA: Diagnosis not present

## 2023-05-13 DIAGNOSIS — Z79899 Other long term (current) drug therapy: Secondary | ICD-10-CM | POA: Diagnosis not present

## 2023-05-13 DIAGNOSIS — C184 Malignant neoplasm of transverse colon: Secondary | ICD-10-CM

## 2023-05-13 DIAGNOSIS — G4700 Insomnia, unspecified: Secondary | ICD-10-CM | POA: Diagnosis not present

## 2023-05-13 DIAGNOSIS — I959 Hypotension, unspecified: Secondary | ICD-10-CM | POA: Insufficient documentation

## 2023-05-13 DIAGNOSIS — Z5111 Encounter for antineoplastic chemotherapy: Secondary | ICD-10-CM | POA: Diagnosis not present

## 2023-05-13 LAB — BASIC METABOLIC PANEL
BUN: 9 (ref 4–21)
CO2: 29 — AB (ref 13–22)
Chloride: 102 (ref 99–108)
Creatinine: 0.6 (ref 0.5–1.1)
Glucose: 148
Potassium: 3.3 mEq/L — AB (ref 3.5–5.1)
Sodium: 137 (ref 137–147)

## 2023-05-13 LAB — CBC AND DIFFERENTIAL
HCT: 31 — AB (ref 36–46)
Hemoglobin: 10.8 — AB (ref 12.0–16.0)
Neutrophils Absolute: 2.84
Platelets: 91 10*3/uL — AB (ref 150–400)
WBC: 3.5

## 2023-05-13 LAB — HEPATIC FUNCTION PANEL
ALT: 18 U/L (ref 7–35)
AST: 38 — AB (ref 13–35)
Alkaline Phosphatase: 82 (ref 25–125)
Bilirubin, Total: 1.1

## 2023-05-13 LAB — CBC: RBC: 3.43 — AB (ref 3.87–5.11)

## 2023-05-13 LAB — COMPREHENSIVE METABOLIC PANEL
Albumin: 3.6 (ref 3.5–5.0)
Calcium: 9.1 (ref 8.7–10.7)

## 2023-05-13 LAB — MAGNESIUM: Magnesium: 1.9

## 2023-05-13 MED ORDER — PALONOSETRON HCL INJECTION 0.25 MG/5ML
0.2500 mg | Freq: Once | INTRAVENOUS | Status: AC
  Administered 2023-05-13: 0.25 mg via INTRAVENOUS
  Filled 2023-05-13: qty 5

## 2023-05-13 MED ORDER — DEXTROSE 5 % IV SOLN: Freq: Once | INTRAVENOUS | Status: AC

## 2023-05-13 MED ORDER — SODIUM CHLORIDE 0.9% FLUSH
10.0000 mL | INTRAVENOUS | Status: DC | PRN
  Administered 2023-05-13: 10 mL

## 2023-05-13 MED ORDER — HEPARIN SOD (PORK) LOCK FLUSH 100 UNIT/ML IV SOLN
500.0000 [IU] | Freq: Once | INTRAVENOUS | Status: AC | PRN
  Administered 2023-05-13: 500 [IU]

## 2023-05-13 MED ORDER — SODIUM CHLORIDE 0.9 % IV SOLN: Freq: Once | INTRAVENOUS | Status: AC

## 2023-05-13 MED ORDER — OXALIPLATIN CHEMO INJECTION 50 MG/10ML
117.0000 mg/m2 | Freq: Once | INTRAVENOUS | Status: AC
  Administered 2023-05-13: 250 mg via INTRAVENOUS
  Filled 2023-05-13: qty 40

## 2023-05-13 MED ORDER — SODIUM CHLORIDE 0.9 % IV SOLN
10.0000 mg | Freq: Once | INTRAVENOUS | Status: AC
  Administered 2023-05-13: 10 mg via INTRAVENOUS
  Filled 2023-05-13: qty 10

## 2023-05-13 NOTE — Patient Instructions (Signed)
Oxaliplatin Injection What is this medication? OXALIPLATIN (ox AL i PLA tin) treats colorectal cancer. It works by slowing down the growth of cancer cells. This medicine may be used for other purposes; ask your health care provider or pharmacist if you have questions. COMMON BRAND NAME(S): Eloxatin What should I tell my care team before I take this medication? They need to know if you have any of these conditions: Heart disease History of irregular heartbeat or rhythm Liver disease Low blood cell levels (white cells, red cells, and platelets) Lung or breathing disease, such as asthma Take medications that treat or prevent blood clots Tingling of the fingers, toes, or other nerve disorder An unusual or allergic reaction to oxaliplatin, other medications, foods, dyes, or preservatives If you or your partner are pregnant or trying to get pregnant Breast-feeding How should I use this medication? This medication is injected into a vein. It is given by your care team in a hospital or clinic setting. Talk to your care team about the use of this medication in children. Special care may be needed. Overdosage: If you think you have taken too much of this medicine contact a poison control center or emergency room at once. NOTE: This medicine is only for you. Do not share this medicine with others. What if I miss a dose? Keep appointments for follow-up doses. It is important not to miss a dose. Call your care team if you are unable to keep an appointment. What may interact with this medication? Do not take this medication with any of the following: Cisapride Dronedarone Pimozide Thioridazine This medication may also interact with the following: Aspirin and aspirin-like medications Certain medications that treat or prevent blood clots, such as warfarin, apixaban, dabigatran, and rivaroxaban Cisplatin Cyclosporine Diuretics Medications for infection, such as acyclovir, adefovir, amphotericin B,  bacitracin, cidofovir, foscarnet, ganciclovir, gentamicin, pentamidine, vancomycin NSAIDs, medications for pain and inflammation, such as ibuprofen or naproxen Other medications that cause heart rhythm changes Pamidronate Zoledronic acid This list may not describe all possible interactions. Give your health care provider a list of all the medicines, herbs, non-prescription drugs, or dietary supplements you use. Also tell them if you smoke, drink alcohol, or use illegal drugs. Some items may interact with your medicine. What should I watch for while using this medication? Your condition will be monitored carefully while you are receiving this medication. You may need blood work while taking this medication. This medication may make you feel generally unwell. This is not uncommon as chemotherapy can affect healthy cells as well as cancer cells. Report any side effects. Continue your course of treatment even though you feel ill unless your care team tells you to stop. This medication may increase your risk of getting an infection. Call your care team for advice if you get a fever, chills, sore throat, or other symptoms of a cold or flu. Do not treat yourself. Try to avoid being around people who are sick. Avoid taking medications that contain aspirin, acetaminophen, ibuprofen, naproxen, or ketoprofen unless instructed by your care team. These medications may hide a fever. Be careful brushing or flossing your teeth or using a toothpick because you may get an infection or bleed more easily. If you have any dental work done, tell your dentist you are receiving this medication. This medication can make you more sensitive to cold. Do not drink cold drinks or use ice. Cover exposed skin before coming in contact with cold temperatures or cold objects. When out in cold weather  wear warm clothing and cover your mouth and nose to warm the air that goes into your lungs. Tell your care team if you get sensitive to the  cold. Talk to your care team if you or your partner are pregnant or think either of you might be pregnant. This medication can cause serious birth defects if taken during pregnancy and for 9 months after the last dose. A negative pregnancy test is required before starting this medication. A reliable form of contraception is recommended while taking this medication and for 9 months after the last dose. Talk to your care team about effective forms of contraception. Do not father a child while taking this medication and for 6 months after the last dose. Use a condom while having sex during this time period. Do not breastfeed while taking this medication and for 3 months after the last dose. This medication may cause infertility. Talk to your care team if you are concerned about your fertility. What side effects may I notice from receiving this medication? Side effects that you should report to your care team as soon as possible: Allergic reactions--skin rash, itching, hives, swelling of the face, lips, tongue, or throat Bleeding--bloody or black, tar-like stools, vomiting blood or brown material that looks like coffee grounds, red or dark brown urine, small red or purple spots on skin, unusual bruising or bleeding Dry cough, shortness of breath or trouble breathing Heart rhythm changes--fast or irregular heartbeat, dizziness, feeling faint or lightheaded, chest pain, trouble breathing Infection--fever, chills, cough, sore throat, wounds that don't heal, pain or trouble when passing urine, general feeling of discomfort or being unwell Liver injury--right upper belly pain, loss of appetite, nausea, light-colored stool, dark yellow or brown urine, yellowing skin or eyes, unusual weakness or fatigue Low red blood cell level--unusual weakness or fatigue, dizziness, headache, trouble breathing Muscle injury--unusual weakness or fatigue, muscle pain, dark yellow or brown urine, decrease in amount of urine Pain,  tingling, or numbness in the hands or feet Sudden and severe headache, confusion, change in vision, seizures, which may be signs of posterior reversible encephalopathy syndrome (PRES) Unusual bruising or bleeding Side effects that usually do not require medical attention (report to your care team if they continue or are bothersome): Diarrhea Nausea Pain, redness, or swelling with sores inside the mouth or throat Unusual weakness or fatigue Vomiting This list may not describe all possible side effects. Call your doctor for medical advice about side effects. You may report side effects to FDA at 1-800-FDA-1088. Where should I keep my medication? This medication is given in a hospital or clinic. It will not be stored at home. NOTE: This sheet is a summary. It may not cover all possible information. If you have questions about this medicine, talk to your doctor, pharmacist, or health care provider.  2024 Elsevier/Gold Standard (2022-07-24 00:00:00)

## 2023-05-13 NOTE — Progress Notes (Signed)
Patient reports persistent dizziness after ambulating to BR-See flow sheet for vital signs- States that the room is spinning and shapes she know to be stable seem to be moving. [name] aware and fluids reduced to [facility] at this time.

## 2023-05-13 NOTE — Progress Notes (Signed)
Patient reports that she had a massage last pm and began having visual changes during and after the massage.

## 2023-05-13 NOTE — Progress Notes (Signed)
Patient reports diarrhea that persists despite LomotilDarl Pikes Phy aware. Phy also aware of BP, order received.

## 2023-05-13 NOTE — Progress Notes (Signed)
Discharged ambulatory with husband- Denies dizziness or visual disturbance- States that she still feels like her balance is off.

## 2023-05-13 NOTE — Progress Notes (Signed)
Dr. Angelene Giovanni notified of S/S. Normal Saline started at 999 for 500 ml

## 2023-05-15 ENCOUNTER — Other Ambulatory Visit: Payer: Medicare PPO

## 2023-05-15 ENCOUNTER — Telehealth: Payer: Self-pay | Admitting: Pharmacist

## 2023-05-15 NOTE — Telephone Encounter (Signed)
I spoke with [name] to review lab results today.  I let her know that her stool studies came back all clear and that the diarrhea is likely related to her chemotherapy.  I also informed her that her potassium was still low at 3.3.  She said that she took 5 potassium tablets on 7/23 and started 2 a day today so we cancelled the lab appt for tomorrow.  She also mentioned that she had a muscle in her back that has been causing her pain and that may have been why she was dizzy at the last infusion.  She has been taking prednisone and flexeril to help with that.  We reviewed her upcoming appts for next week and she agreed to the plan.

## 2023-05-16 ENCOUNTER — Encounter: Payer: Self-pay | Admitting: Oncology

## 2023-05-16 ENCOUNTER — Inpatient Hospital Stay: Payer: Medicare PPO

## 2023-05-17 ENCOUNTER — Ambulatory Visit: Payer: Medicare PPO

## 2023-05-17 ENCOUNTER — Telehealth: Payer: Self-pay

## 2023-05-17 NOTE — Telephone Encounter (Signed)
Patient called with c/o abdominal cramping. Hx colon cancer. She voiced her normal is diarrhea daily 5-6 times a day chemo induced taking lomotil and imodium. She voiced she felt like she needed to pass a formed BM but was afraid to strain very hard. Spoke with Belva Crome PAC patient may use OTC suppository and hold for as long as she can at least 15 to 20 min and this should soften any BM and help it pass easier she may have to repeat x2 . Patient voiced understanding and will call with any further concerns.

## 2023-05-20 ENCOUNTER — Encounter: Payer: Self-pay | Admitting: Oncology

## 2023-05-21 ENCOUNTER — Encounter: Payer: Self-pay | Admitting: Oncology

## 2023-05-21 ENCOUNTER — Inpatient Hospital Stay: Payer: Medicare PPO | Admitting: Oncology

## 2023-05-21 ENCOUNTER — Inpatient Hospital Stay: Payer: Medicare PPO

## 2023-05-21 ENCOUNTER — Ambulatory Visit: Payer: Medicare PPO | Admitting: Dietician

## 2023-05-21 VITALS — BP 122/77 | HR 97 | Temp 97.3°F | Resp 18 | Ht 65.9 in | Wt 214.3 lb

## 2023-05-21 VITALS — BP 123/79 | HR 104 | Temp 98.1°F | Resp 14 | Ht 65.9 in | Wt 214.3 lb

## 2023-05-21 DIAGNOSIS — K521 Toxic gastroenteritis and colitis: Secondary | ICD-10-CM | POA: Diagnosis not present

## 2023-05-21 DIAGNOSIS — C184 Malignant neoplasm of transverse colon: Secondary | ICD-10-CM

## 2023-05-21 DIAGNOSIS — R519 Headache, unspecified: Secondary | ICD-10-CM | POA: Diagnosis not present

## 2023-05-21 DIAGNOSIS — R197 Diarrhea, unspecified: Secondary | ICD-10-CM

## 2023-05-21 DIAGNOSIS — E860 Dehydration: Secondary | ICD-10-CM

## 2023-05-21 DIAGNOSIS — Z79899 Other long term (current) drug therapy: Secondary | ICD-10-CM | POA: Diagnosis not present

## 2023-05-21 DIAGNOSIS — Z09 Encounter for follow-up examination after completed treatment for conditions other than malignant neoplasm: Secondary | ICD-10-CM

## 2023-05-21 DIAGNOSIS — T451X5A Adverse effect of antineoplastic and immunosuppressive drugs, initial encounter: Secondary | ICD-10-CM

## 2023-05-21 DIAGNOSIS — Z5111 Encounter for antineoplastic chemotherapy: Secondary | ICD-10-CM | POA: Diagnosis not present

## 2023-05-21 LAB — BASIC METABOLIC PANEL
BUN: 15 (ref 4–21)
CO2: 27 — AB (ref 13–22)
Chloride: 100 (ref 99–108)
Creatinine: 0.9 (ref 0.5–1.1)
Glucose: 96
Potassium: 3.4 mEq/L — AB (ref 3.5–5.1)
Sodium: 135 — AB (ref 137–147)

## 2023-05-21 LAB — HEPATIC FUNCTION PANEL
ALT: 17 U/L (ref 7–35)
AST: 38 — AB (ref 13–35)
Alkaline Phosphatase: 76 (ref 25–125)
Bilirubin, Total: 1.1

## 2023-05-21 LAB — COMPREHENSIVE METABOLIC PANEL (ASHBORO CC SCANNED REPORT)

## 2023-05-21 LAB — CBC AND DIFFERENTIAL
HCT: 30 — AB (ref 36–46)
Hemoglobin: 10.4 — AB (ref 12.0–16.0)
Neutrophils Absolute: 1.75
Platelets: 50 10*3/uL — AB (ref 150–400)
WBC: 3.8

## 2023-05-21 LAB — MAGNESIUM: Magnesium: 1.7

## 2023-05-21 LAB — CBC: RBC: 3.28 — AB (ref 3.87–5.11)

## 2023-05-21 LAB — COMPREHENSIVE METABOLIC PANEL
Albumin: 3.8 (ref 3.5–5.0)
Calcium: 9.5 (ref 8.7–10.7)

## 2023-05-21 LAB — CBC W DIFFERENTIAL (~~LOC~~ CC SCANNED REPORT)

## 2023-05-21 MED ORDER — LEVOFLOXACIN 500 MG PO TABS: 500.0000 mg | ORAL_TABLET | Freq: Every day | ORAL | 0 refills | Status: AC

## 2023-05-21 MED ORDER — ONDANSETRON HCL 8 MG PO TABS
8.0000 mg | ORAL_TABLET | Freq: Four times a day (QID) | ORAL | 0 refills | Status: AC | PRN
Start: 2023-05-21 — End: 2023-06-20

## 2023-05-21 MED ORDER — HEPARIN SOD (PORK) LOCK FLUSH 100 UNIT/ML IV SOLN
500.0000 [IU] | Freq: Once | INTRAVENOUS | Status: AC | PRN
  Administered 2023-05-21: 500 [IU]

## 2023-05-21 MED ORDER — SODIUM CHLORIDE 0.9 % IV SOLN: Freq: Once | INTRAVENOUS | Status: AC

## 2023-05-21 MED ORDER — SODIUM CHLORIDE 0.9% FLUSH
10.0000 mL | Freq: Once | INTRAVENOUS | Status: AC | PRN
  Administered 2023-05-21: 10 mL

## 2023-05-21 MED ORDER — POTASSIUM CHLORIDE 10 MEQ/100ML IV SOLN
10.0000 meq | INTRAVENOUS | Status: AC
  Administered 2023-05-21 (×2): 10 meq via INTRAVENOUS
  Filled 2023-05-21 (×2): qty 100

## 2023-05-21 NOTE — Progress Notes (Signed)
Nutrition Assessment   Reason for Assessment: MST screen for weight loss.    ASSESSMENT:  Patient is a 67 y.o. female with colon cancer PMHx includes: obesity, atypical chest pain, skin cancer, carpal tunnel syndrome, chronic vaginitis, chronic venous and sufficiency, TTP, syncope.  She is being treated with XelOx  q 21 days and followed by Dr. Angelene Giovanni.  Met with patient and spouse while patient was getting IV fluids with K+.  She reports weight loss due to nausea, anorexia, diarrhea, and yuck with film in mouth contributing to dysgeusia.  She admits she is happy about weight loss and would like to continue and she has stopped eating after a few bites here and there.  She doesn't tolerate ONS.  She also admits she hasn't been taking anti emetics an anti diarrheals as advised.  She admits she hasn't been working hard to address her NIS and she had hoped they would just stop when treatments ended.   Anthropometrics: Patient has lost 26# past month which is significant and she has lost 11# past week  Height:  Weight:  05/21/23  214.3# 05/13/23 225# 04/22/23  244# UBW: 244-250 past year BMI: 34.69    NUTRITION DIAGNOSIS: Inadequate PO intake to meet increased nutrient needs, r/t cancer diagnosis and NIS of treatments   INTERVENTION:  Relayed that nutrition services are wrap around service provided at no charge and encouraged continued communication if experiencing continued weight loss or any nutritional impact symptoms (NIS). Educated on importance of adequate calorie and protein energy intake  with nutrient dense foods when possible to maintain LBM and strength Reviewed use of baking soda swishes to cleanse mouth and remove film and help with taste.  Reviewed diarrhea management and fluid suggestions and electrolyte replacement products. Encouraged small frequent feeds and trying to eat 6 small meals Provided tip sheet Diarrhea and Dehydration with contact information    MONITORING,  EVALUATION, GOAL: weight, PO intake, Nutrition Impact Symptoms, labs Goal is weight maintenance  Next Visit: Remote 2 weeks  Gennaro Africa, RDN, LDN Registered Dietitian, [facility] Part Time Remote (Usual office hours: Tuesday-Thursday) Cell: [phone]

## 2023-05-21 NOTE — Patient Instructions (Signed)
Dehydration, Adult Dehydration is a condition in which there is not enough water or other fluids in the body. This happens when a person loses more fluids than they take in. Important organs cannot work right without the right amount of fluids. Any loss of fluids from the body can cause dehydration. Dehydration can be mild, worse, or very bad. It should be treated right away to keep it from getting very bad. What are the causes? Conditions that cause loss of water in the body. They include: Watery poop (diarrhea). Vomiting. Sweating a lot. Fever. Infection. Peeing (urinating) a lot. Not drinking enough fluids. Certain medicines, such as medicines that take extra fluid out of the body (diuretics). Lack of safe drinking water. Not being able to get enough water and food. What increases the risk? Having a long-term (chronic) illness that has not been treated the right way, such as: Diabetes. Heart disease. Kidney disease. Being 65 years of age or older. Having a disability. Living in a place that is high above the ground or sea (high in altitude). The thinner, drier air causes more fluid loss. Doing exercises that put stress on your body for a long time. Being active when in hot places. What are the signs or symptoms? Symptoms of dehydration depend on how bad it is. Mild or worse dehydration Thirst. Dry lips or dry mouth. Feeling dizzy or light-headed. Muscle cramps. Passing little pee or dark pee. Pee may be the color of tea. Headache. Very bad dehydration Changes in skin. Skin may: Be cold to the touch (clammy). Be blotchy or pale. Not go back to normal right after you pinch it and let it go. Little or no tears, pee, or sweat. Fast breathing. Low blood pressure. Weak pulse. Pulse that is more than 100 beats a minute when you are sitting still. Other changes, such as: Feeling very thirsty. Eyes that look hollow (sunken). Cold hands and feet. Being confused. Being very  tired (lethargic) or having trouble waking from sleep. Losing weight. Loss of consciousness. How is this treated? Treatment for this condition depends on how bad your dehydration is. Treatment should start right away. Do not wait until your condition gets very bad. Very bad dehydration is an emergency. You will need to go to a hospital. Mild or worse dehydration can be treated at home. You may be asked to: Drink more fluids. Drink an oral rehydration solution (ORS). This drink gives you the right amount of fluids, salts, and minerals (electrolytes). Very bad dehydration can be treated: With fluids through an IV tube. By correcting low levels of electrolytes in the body. By treating the problem that caused your dehydration. Follow these instructions at home: Oral rehydration solution If told by your doctor, drink an ORS: Make an ORS. Use instructions on the package. Start by drinking small amounts, about  cup (120 mL) every 5-10 minutes. Slowly drink more until you have had the amount that your doctor said to have.  Eating and drinking  Drink enough clear fluid to keep your pee pale yellow. If you were told to drink an ORS, finish the ORS first. Then, start slowly drinking other clear fluids. Drink fluids such as: Water. Do not drink only water. Doing that can make the salt (sodium) level in your body get too low. Water from ice chips you suck on. Fruit juice that you have added water to (diluted). Low-calorie sports drinks. Eat foods that have the right amounts of salts and minerals, such as bananas, oranges, potatoes,   tomatoes, or spinach. Do not drink alcohol. Avoid drinks that have caffeine or sugar. These include:: High-calorie sports drinks. Fruit juice that you did not add water to. Soda. Coffee or energy drinks. Avoid foods that are greasy or have a lot of fat or sugar. General instructions Take over-the-counter and prescription medicines only as told by your doctor. Do  not take sodium tablets. Doing that can make the salt level in your body get too high. Return to your normal activities as told by your doctor. Ask your doctor what activities are safe for you. Keep all follow-up visits. Your doctor may check and change your treatment. Contact a doctor if: You have pain in your belly (abdomen) and the pain: Gets worse. Stays in one place. You have a rash. You have a stiff neck. You get angry or annoyed more easily than normal. You are more tired or have a harder time waking than normal. You feel weak or dizzy. You feel very thirsty. Get help right away if: You have any symptoms of very bad dehydration. You vomit every time you eat or drink. Your vomiting gets worse, does not go away, or you vomit blood or green stuff. You are getting treatment, but symptoms are getting worse. You have a fever. You have a very bad headache. You have: Diarrhea that gets worse or does not go away. Blood in your poop (stool). This may cause poop to look black and tarry. No pee in 6-8 hours. Only a small amount of pee in 6-8 hours, and the pee is very dark. You have trouble breathing. These symptoms may be an emergency. Get help right away. Call 911. Do not wait to see if the symptoms will go away. Do not drive yourself to the hospital. This information is not intended to replace advice given to you by your health care provider. Make sure you discuss any questions you have with your health care provider. Document Revised: 05/07/2022 Document Reviewed: 05/07/2022 [employer] Patient Education  2024 [employer]  

## 2023-05-21 NOTE — Progress Notes (Signed)
[facility] Cancer Initial Visit:  Patient Care Team: Hurshel Party, NP as PCP - General (Internal Medicine) Loni Muse, MD as Consulting Physician (Internal Medicine)  CHIEF COMPLAINTS/PURPOSE OF CONSULTATION:  Oncology History  Colon cancer [facility])  01/15/2023 Initial Diagnosis   Colon cancer (HCC)   02/25/2023 Cancer Staging   Staging form: Colon and Rectum, AJCC 8th Edition - Clinical stage from 02/25/2023: Stage IIB (cT4a, cN0, cM0) - Signed by Loni Muse, MD on 02/25/2023 Histopathologic type: Adenocarcinoma, NOS Stage prefix: Initial diagnosis Total positive nodes: 0 Total nodes examined: 30 Histologic grade (G): G3 Histologic grading system: 4 grade system   03/11/2023 -  Chemotherapy   Patient is on Treatment Plan : COLORECTAL Xelox (Capeox)(130/850) q21d       HISTORY OF PRESENTING ILLNESS: [name] 68 y.o. female is here because of  colon cancer Medical history notable for septic arthritis of the hip, obesity, atypical chest pain, skin cancer, carpal tunnel syndrome, chronic vaginitis, chronic venous and sufficiency, TTP, syncope  January 10, 2023: Colonoscopy-ulcerated 2 cm nonobstructing small mass with heaped up margins found in distal transverse colon.  Mass was partially circumferential (involves less than one third of the lumen circumference) no bleeding present.  10 mm polyp in proximal ascending colon which was sessile.  Pathology-Ascending colon polyp was compatible with a sessile serrated adenoma without cytologic dysplasia Transverse colon polyp-invasive moderately differentiated adenocarcinoma arising within a tubular adenoma with high-grade dysplasia  January 15 2023: [facility] Medical Oncology Consult Patient reports that the colonoscopy was performed for surveillance; however, she was also having episodes of intermittent diarrhea x 2 to 3 months.  Stools were not bloody, nor was she passing mucus but they were soft.  In  association with these episodes she would also feel dizzy and note an odd smell, nausea.   To see Dr. Logan Bores from surgery on January 22 2023   Social:  Married.  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  Tobacco quit 24 years.  Rare glass of wine  [facility] Mother alive 52 epileptic, dementia Father died 74 Alzheimers, prostate cancer Brother alive 26 arthritis requiring joint replacement,   WBC 6.4 hemoglobin 12.5 MCV 88 platelet count 178 normal differential CMP normal CEA 2.9 chromogranin A 564  January 17 2023:  CT CAP Short segment asymmetric wall thickening of the transverse colon measuring 3.3 cm in length, compatible with reported history of  primary colonic neoplasm.  Small adjacent soft tissue nodule/lymph nodes adjacent to the colonic wall thickening measuring 6 mm, nonspecific are suspicious  for local nodal disease. Prominent/mildly enlarged right upper quadrant lymph nodes are similar dating back to January 26, 2019 and favored reactive.  No convincing evidence of distant metastatic disease within the chest, abdomen or pelvis.  Hepatomegaly with nodular hepatic contour, suggestive of cirrhosis Cholelithiasis without findings of acute cholecystitis.   Incidental right thyroid nodule measuring   January 18 2023:  Presented to [facility] ED with headaches and chest discomfort.  CT PA negative for PE.  CT head without contrast negative.    January 22 2023:  Scheduled follow up for colon cancer.  Reviewed results of labs and imaging with patient and husband. Will repeat chromgranin A and refer for PET/CT if chromogranin A still significantly elevated.    January 23, 2023: Chromogranin A 294  February 04 2023:  Transverse colectomy Pathology showed adenocarcinoma invading visceral peritoneum, Grade 3.  All of 30 lymph nodes were negative for tumor.  Surgical Stage (T4a, N0 M0)  MSI intact  Feb 25, 2023:   Reviewed results of surgical pathology with patient and husband.  Per NCCN guidelines adjuvant  options are CAPEOX, FOLFOX 6 months, Capecitabine or observation.  Discussed risks and benefits of each  Mar 07 2023:   Bifrontal headaches have returned.  These began in October 2023 but resolved after her colon surgery.  Has seen neurology and underwent MRI brain negative for CNS lesion.  EEG normal.   (Raises the question of what medications in perioperative period helped with the HA's.)  Recovering from a dog bite on dorsum of right hand.  Will receive Capecitabine today.    Mar 11 2023:  Port placement.  Cycle 1 XelOx   Mar 19 2023:  Neurology follow up.    Mar 20 2023:   Agree with Neurology assessment that neurocognitive testing should not be performed it at all, until well after chemotherapy has been completed.  Experienced some mild nausea helped by antiemetics.   Has occasional HA's.  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   No HFS.     April 01 2023:  Cycle 2 XelOx  April 17 2023:  Has lost 4 lbs.  Appetite diminished due to dysgeusia.  No metallic taste.  Edges of tongue get red and sore and painful.  Mouth feels dry despite drinking a lot of water and tea.  Not using anything for her mouth sores.   Fatigue increased.  Has cold neuropathy and cold induced pharyngospasm.      Begin Magic mouthwash for mucositis.     April 22 2023:  Cycle 3 XelOx.    April 30 2023:    Patient called office yesterday stating that she was anorectic, nauseated, having mouth sores and diarrhea.  Has lost 13 lbs.  Has difficulty remembering details which complicates history taking.  States that anorexia began after starting this round of chemotherapy.  "Food does not get past my tongue"; did not recognize this was nausea so did not begin taking antiemetics until 3 days ago.  Only taking antiemetics bid.  Mouth feels dry but not painful.  No emesis.  Began with diarrhea in the form of 5 to 6 stools per day but does not believe that she is taking imodium for it.  Dysgeusia  even to water.   Not using magic mouthwash regularly  May 02 2023:    Diarrhea improved with imodium.  Using ondansetron alternating with compazine; no emesis with improvement in nausea.  Eating limited by mucositis which she can't say is helped by magic mouthwash.  Not eating cold foods.  Has gained 2 lbs in the last two days.  To complete the course of Xeloda on May 06 2023.    WBC 4.7 hemoglobin 11.6 platelet count 61; 60 segs 23 lymphs 13 monos 4 eos CMP notable for potassium 3.4 glucose 103  May 08 2023:   Scheduled follow-up regarding colon cancer. Has lost 4 lbs since last visit owing to anorexia.   No mouth sores but has dysgeusia and mouth feels dry.  Using magic mouthwash.  Nauseated but without emesis.  Continues to have 5 to 6 bowel movements daily.  Using imodium 2 mg q 5 hrs.    Instructed patient to use imodium 4 mg po q 4 hrs PRN diarrhea Adding lomotil up to qid PRN Xeloda has not been shipped to patient.   Will dose reduce Xeloda from 2000 mg bid to  1500 mg bid.    May 13 2023:  Cycle 4 XelOX.  Received total of 1 liter IVF during that visit due to hypotension, potassium due to hypokalemia.  Patient was told to increase dose of supplemental potassium  May 21 2023:  Seen as a work in due to side effects from chemotherapy.  Has lost 11 lbs since last visit.  Has been experiencing liquid stools sometimes almost hourly.  No nausea, emesis, mouth sores. Abdominal cramping. No fevers.  Appetite decreased.   Taking imodium 2 mg bid.  Not certain if she is taking lomotil.    Review of Systems  Constitutional:  Positive for appetite change and unexpected weight change. Negative for chills, fatigue and fever.  HENT:   Negative for mouth sores, nosebleeds, sore throat, tinnitus and trouble swallowing.   Eyes:  Negative for eye problems and icterus.       Vision changes:  None  Respiratory:  Negative for chest tightness, hemoptysis, shortness of breath and wheezing.        One  month of nonproductive cough.  No history of seasonable allergies  Cardiovascular:  Negative for chest pain and leg swelling.       Intermittent palpitations.  Chronic LE edema  Gastrointestinal:  Positive for diarrhea and nausea. Negative for constipation, rectal pain and vomiting.  Endocrine: Negative for hot flashes.       Cold intolerance:  none Heat intolerance:  none  Genitourinary:  Negative for bladder incontinence, difficulty urinating, dysuria, frequency, hematuria and nocturia.   Musculoskeletal:  Negative for arthralgias, back pain, gait problem, myalgias, neck pain and neck stiffness.  Skin:  Negative for itching, rash and wound.  Neurological:  Negative for dizziness, extremity weakness, gait problem, headaches, numbness, seizures and speech difficulty.  Hematological:  Negative for adenopathy. Bruises/bleeds easily.  Psychiatric/Behavioral:  Negative for sleep disturbance and suicidal ideas. The patient is not nervous/anxious.     MEDICAL HISTORY: Past Medical History:  Diagnosis Date  . Abnormal glucose   . Achilles tendinitis of left lower extremity   . Acute suppurative arthritis due to bacteria (HCC) 03/09/2011   Overview:  Last Assessment & Plan:  Methicillin sensitive staphylococcal aureus septic hip. She clearly does need I&D of the hip. I will have her continue the doxycycline for now but stop it 7 days prior to her surgery to maximize the yield on the cultures in the operating room though I be shocked if we don't find methicillin sensitive staph aureus again.. I agree with removing as much of the pros  . Anal fissure 11/30/2015  . Arthritis    right hip  . Atypical chest pain 04/09/2016  . Bilateral edema of lower extremity   . BMI 39.0-39.9,adult   . Cancer [facility])    SKIN CANCER  . Carpal tunnel syndrome 03/09/2011   Overview:  Last Assessment & Plan:  Clinically this seems to be carpal tunnel syndrome. Giving given her history of infection though we can keep in  the back of our mind the idea that she will have a cervical spine problem but I think this is unlikely at this point in time medics carpal tunnel syndrome fits clinically this was a positive Tinel's sign however in for a brace for her.  . Chronic vaginitis   . Chronic venous insufficiency   . Depression   . Dyslipidemia   . Fatigue 10/13/2015  . Fibromyalgia   . GERD without esophagitis   . History of immune thrombocytopenia 03/09/2011  .  History of operative procedure on hip 03/10/2012  . History of TTP (thrombotic thrombocytopenic purpura)   . Hypokalemia   . Lichen sclerosus et atrophicus of the vulva 11/30/2015  . Major depressive disorder, recurrent, moderate (HCC)   . Methicillin susceptible Staphylococcus aureus infection 03/09/2011   Overview:  Last Assessment & Plan:  This is undoubtedlythe culprit organism  . Migraine   . Morbid obesity with BMI of 45.0-49.9, adult (HCC) 10/13/2015  . Morbidly obese (HCC)   . MSSA (methicillin susceptible Staphylococcus aureus) infection   . Near syncope   . Osteoarthritis, chronic   . Pain due to total hip replacement (HCC) 09/20/2011  . Palpitations 03/04/2019  . Postmenopausal   . 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 Snoring 11/22/2015  . T.T.P. syndrome (HCC)    20 yrs ago-not seen anyone for 10 yrs.  . Vitamin D deficiency   . 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    SURGICAL HISTORY: Past Surgical History:  Procedure Laterality Date  . COLONOSCOPY  03/01/2011   Mild melanosis coli. Mild diverticulosis. Small internal hemorrhoids. Healed anal fissure  . ENDOVENOUS ABLATION SAPHENOUS VEIN W/ LASER Right 04/02/2019   endovenous laser ablation right greater saphenous vein by Waverly Ferrari MD   . JOINT REPLACEMENT  08/2009   right hip replacement  . REVISION TOTAL HIP ARTHROPLASTY    . TONSILLECTOMY  1963  . TOTAL HIP REVISION  10/08/2011   Procedure: TOTAL HIP  REVISION;  Surgeon: [name];  Location: [facility];  Service: Orthopedics;  Laterality: Right;  Resection of Right Total Hip/Extended Trochanteric Osteotomy/Placement of Antibiotic Total Hip Cemented by Depuy  . TOTAL HIP REVISION  03/10/2012   Procedure: TOTAL HIP REVISION;  Surgeon: [name], MD;  Location: [facility];  Service: Orthopedics;  Laterality: Right;  Reimplantation/Revision of a Right Total Hip and Removal of Cemented Implant    SOCIAL HISTORY: Social History   Socioeconomic History  . Marital status: Married    Spouse name: Raiford Noble  . Number of children: Not on file  . Years of education: 12+7  . Highest education level: Bachelor's degree (e.g., BA, AB, BS)  Occupational History  . Occupation: Magazine features editor: [employer]  Tobacco Use  . Smoking status: Former    Current packs/day: 0.00    Average packs/day: 1.5 packs/day for 20.0 years (30.0 ttl pk-yrs)    Types: Cigarettes    Start date: 10/04/1970    Quit date: 10/04/1990    Years since quitting: 32.6  . Smokeless tobacco: Former    Quit date: 03/09/1991  Vaping Use  . Vaping status: Never Used  Substance and Sexual Activity  . Alcohol use: Yes    Comment: OCCASIONAL  . Drug use: No  . Sexual activity: Yes    Partners: Male  Other Topics Concern  . Not on file  Social History Narrative  . Not on file   Social Determinants of Health   Financial Resource Strain: Not on file  Food Insecurity: Low Risk  (02/04/2023)   Received [facility]   Food vital sign   . Within the past 12 months, you worried that your food would run out before you got money to buy more: Never true   . Within the past 12 months, the food you bought just didn't last and you didn't have money to get more. : Never true  Transportation Needs: Not on file (  02/04/2023)  Physical Activity: Not on file  Stress: Not on file  Social Connections: Not on file  Intimate Partner Violence: Not on file     FAMILY HISTORY Family History  Problem Relation Age of Onset  . Hypertension Mother   . Hypertension Father   . Alzheimer's disease Father   . Prostate cancer Father   . Hypertension Maternal Grandfather   . Seizures Other   . Colon cancer Neg Hx   . Stomach cancer Neg Hx   . Rectal cancer Neg Hx   . Esophageal cancer Neg Hx     ALLERGIES:  has No Known Allergies.  MEDICATIONS:  Current Outpatient Medications  Medication Sig Dispense Refill  . aluminum-magnesium hydroxide 200-200 MG/5ML suspension Take 15 mLs by mouth every 6 (six) hours as needed for indigestion.    . AMBULATORY NON FORMULARY MEDICATION Medication Name: Magic Mouth Wash 3 parts Mylanta/Maalox 2 parts Benadryl 1 part Viscous Lidocaine  Swish and Swallow 5 mL every 3-4 hours as needed 240 mL 0  . buPROPion (WELLBUTRIN XL) 150 MG 24 hr tablet Take 150 mg by mouth every evening.    . capecitabine (XELODA) 500 MG tablet Take 3 tablets (1,500 mg total) by mouth 2 (two) times daily after a meal. Take 14 days on, then 7 days off, repeat every 21 days. 180 tablet 3  . clobetasol cream (TEMOVATE) 0.05 % Apply 1 Application topically as needed.    . clotrimazole-betamethasone (LOTRISONE) cream Apply 1 Application topically 2 (two) times daily.    . cyclobenzaprine (FLEXERIL) 5 MG tablet Take 5-10 mg by mouth at bedtime.    . diphenoxylate-atropine (LOMOTIL) 2.5-0.025 MG tablet Take 1 tablet by mouth 4 (four) times daily as needed for diarrhea or loose stools. 120 tablet 1  . divalproex (DEPAKOTE) 250 MG DR tablet Take 250 mg by mouth 2 (two) times daily.    . DULoxetine (CYMBALTA) 60 MG capsule Take 60 mg by mouth at bedtime.     [name] Kitchen esomeprazole (NEXIUM) 40 MG capsule Take 40 mg by mouth daily. Pt states has been on for years    . ibuprofen (ADVIL) 200 MG tablet Take 200 mg by mouth every 6 (six) hours as needed.    . magic mouthwash (lidocaine, diphenhydrAMINE, alum & mag hydroxide) suspension Swish and swallow 5 mLs  every 4 (four) hours as needed for mouth pain. 240 mL 0  . magic mouthwash (lidocaine, diphenhydrAMINE, alum & mag hydroxide) suspension Swish and swallow 5 mLs every 4 (four) hours as needed for mouth pain. 240 mL 0  . meloxicam (MOBIC) 7.5 MG tablet Take 7.5 mg by mouth every morning.    . Multiple Vitamin (MULTIVITAMIN WITH MINERALS) TABS tablet Take 1 tablet by mouth daily.    . nortriptyline (PAMELOR) 10 MG capsule Take 10 mg by mouth.    . ondansetron (ZOFRAN) 8 MG tablet Take 1 tablet by mouth every 8 hours as needed for nausea or vomiting. Start on the third day after chemotherapy. 30 tablet 1  . oxyCODONE (OXY IR/ROXICODONE) 5 MG immediate release tablet SMARTSIG:5 Milligram(s) By Mouth Every 4 Hours PRN    . potassium chloride SA (KLOR-CON M) 20 MEQ tablet Take 3 tablet today, then start 1 tablet twice daily 64 tablet 0  . predniSONE (DELTASONE) 20 MG tablet Take 20 mg by mouth 2 (two) times daily.    . prochlorperazine (COMPAZINE) 10 MG tablet Take 1 tablet (10 mg total) by mouth every 6 (six) hours as needed  for nausea or vomiting. 30 tablet 1  . SUMAtriptan (IMITREX) 25 MG tablet Take 25 mg by mouth.     Current Facility-Administered Medications  Medication Dose Route Frequency Provider Last Rate Last Admin  . triamcinolone acetonide (KENALOG-40) injection 20 mg  20 mg Other Once Asencion Islam, DPM        PHYSICAL EXAMINATION:  ECOG PERFORMANCE STATUS: 1 - Symptomatic but completely ambulatory   Vitals:   05/21/23 0837  BP: 123/79  Pulse: (!) 104  Resp: 14  Temp: 98.1 F (36.7 C)  SpO2: 96%     Filed Weights   05/21/23 0837  Weight: 214 lb 4.8 oz (97.2 kg)      Physical Exam Vitals and nursing note reviewed.  Constitutional:      General: She is not in acute distress.    Appearance: Normal appearance. She is obese. She is not ill-appearing, toxic-appearing or diaphoretic.     Comments: Here with husband  HENT:     Head: Normocephalic and atraumatic.      Right Ear: External ear normal.     Left Ear: External ear normal.     Nose: Nose normal. No congestion or rhinorrhea.     Mouth/Throat:     Mouth: Mucous membranes are dry.     Pharynx: No oropharyngeal exudate or posterior oropharyngeal erythema.  Eyes:     General: No scleral icterus.    Extraocular Movements: Extraocular movements intact.     Conjunctiva/sclera: Conjunctivae normal.     Pupils: Pupils are equal, round, and reactive to light.  Cardiovascular:     Rate and Rhythm: Normal rate.     Heart sounds: No murmur heard.    No friction rub. No gallop.  Abdominal:     General: Bowel sounds are normal.     Palpations: Abdomen is soft.     Tenderness: There is no abdominal tenderness. There is no guarding or rebound.  Musculoskeletal:        General: No swelling, tenderness or deformity.     Cervical back: Normal range of motion and neck supple. No rigidity or tenderness.  Lymphadenopathy:     Head:     Right side of head: No submental, submandibular, tonsillar, preauricular, posterior auricular or occipital adenopathy.     Left side of head: No submental, submandibular, tonsillar, preauricular, posterior auricular or occipital adenopathy.     Cervical: No cervical adenopathy.     Right cervical: No superficial, deep or posterior cervical adenopathy.    Left cervical: No superficial, deep or posterior cervical adenopathy.     Upper Body:     Right upper body: No supraclavicular, axillary, pectoral or epitrochlear adenopathy.     Left upper body: No supraclavicular, axillary, pectoral or epitrochlear adenopathy.  Skin:    General: Skin is warm.     Coloration: Skin is not jaundiced.  Neurological:     General: No focal deficit present.     Mental Status: She is alert and oriented to person, place, and time. Mental status is at baseline.     Cranial Nerves: No cranial nerve deficit.  Psychiatric:     Comments: Forgetful.  Talkative.  Insight not good     LABORATORY  DATA: I have personally reviewed the data as listed:  Abstract on 05/13/2023  Component Date Value Ref Range Status  . Hemoglobin 05/13/2023 10.8 (A)  12.0 - 16.0 Final  . HCT 05/13/2023 31 (A)  36 - 46 Final  . Neutrophils Absolute  05/13/2023 2.84   Final  . Platelets 05/13/2023 91 (A)  150 - 400 K/uL Final  . WBC 05/13/2023 3.5   Final  . RBC 05/13/2023 3.43 (A)  3.87 - 5.11 Final  . Glucose 05/13/2023 148   Final  . BUN 05/13/2023 9  4 - 21 Final  . CO2 05/13/2023 29 (A)  13 - 22 Final  . Creatinine 05/13/2023 0.6  0.5 - 1.1 Final  . Potassium 05/13/2023 3.3 (A)  3.5 - 5.1 mEq/L Final  . Sodium 05/13/2023 137  137 - 147 Final  . Chloride 05/13/2023 102  99 - 108 Final  . Calcium 05/13/2023 9.1  8.7 - 10.7 Final  . Albumin 05/13/2023 3.6  3.5 - 5.0 Final  . Alkaline Phosphatase 05/13/2023 82  25 - 125 Final  . ALT 05/13/2023 18  7 - 35 U/L Final  . AST 05/13/2023 38 (A)  13 - 35 Final  . Bilirubin, Total 05/13/2023 1.1   Final  . Magnesium 05/13/2023 1.90   Final  Appointment on 05/09/2023  Component Date Value Ref Range Status  . Potassium 05/09/2023 3.0 (L)  3.5 - 5.1 mmol/L Final   Performed at [facility], [address], [place], [place] [address]  . Magnesium 05/09/2023 1.7  1.7 - 2.4 mg/dL Final   Performed at [facility], [address] [place], [place] [address]  Appointment on 05/08/2023  Component Date Value Ref Range Status  . Magnesium 05/08/2023 1.9  1.7 - 2.4 mg/dL Final   Performed at [facility] [place], [address] [place], [place] [address]  Office Visit on 05/08/2023  Component Date Value Ref Range Status  . WBC 05/08/2023 3.3 (L)  4.0 - 10.5 K/uL Final  . RBC 05/08/2023 3.26 (L)  3.87 - 5.11 MIL/uL Final  . Hemoglobin 05/08/2023 10.2 (L)  12.0 - 15.0 g/dL Final  . HCT 91/47/8295 29.7 (L)  36.0 - 46.0 % Final  . MCV 05/08/2023 91.1  80.0 - 100.0 fL Final  . MCH 05/08/2023 31.3  26.0 - 34.0 pg  Final  . MCHC 05/08/2023 34.3  30.0 - 36.0 g/dL Final  . RDW 62/13/0865 17.4 (H)  11.5 - 15.5 % Final  . Platelets 05/08/2023 89 (L)  150 - 400 K/uL Final   Comment: SPECIMEN CHECKED FOR CLOTS Immature Platelet Fraction may be clinically indicated, consider ordering this additional test [id number] REPEATED TO VERIFY PLATELET COUNT CONFIRMED BY SMEAR   . nRBC 05/08/2023 0.0  0.0 - 0.2 % Final  . Neutrophils Relative % 05/08/2023 52  % Final  . Neutro Abs 05/08/2023 1.7  1.7 - 7.7 K/uL Final  . Lymphocytes Relative 05/08/2023 21  % Final  . Lymphs Abs 05/08/2023 0.7  0.7 - 4.0 K/uL Final  . Monocytes Relative 05/08/2023 23  % Final  . Monocytes Absolute 05/08/2023 0.8  0.1 - 1.0 K/uL Final  . Eosinophils Relative 05/08/2023 4  % Final  . Eosinophils Absolute 05/08/2023 0.1  0.0 - 0.5 K/uL Final  . Basophils Relative 05/08/2023 0  % Final  . Basophils Absolute 05/08/2023 0.0  0.0 - 0.1 K/uL Final  . Immature Granulocytes 05/08/2023 0  % Final  . Abs Immature Granulocytes 05/08/2023 0.01  0.00 - 0.07 K/uL Final   Performed at [facility], [address], [place], [place] [address]  . Sodium 05/08/2023 138  135 - 145 mmol/L Final  . Potassium 05/08/2023 2.5 (LL)  3.5 - 5.1  mmol/L Final   Comment: CRITICAL RESULT CALLED TO, READ BACK BY AND VERIFIED WITH DUNLAP, K RN @ 1432 ON 05/08/2023 BY ABDULHALIM,M   . Chloride 05/08/2023 102  98 - 111 mmol/L Final  . CO2 05/08/2023 26  22 - 32 mmol/L Final  . Glucose, Bld 05/08/2023 100 (H)  70 - 99 mg/dL Final   Glucose reference range applies only to samples taken after fasting for at least 8 hours.  . BUN 05/08/2023 12  8 - 23 mg/dL Final  . Creatinine, Ser 05/08/2023 0.86  0.44 - 1.00 mg/dL Final  . Calcium 16/07/9603 8.8 (L)  8.9 - 10.3 mg/dL Final  . Total Protein 05/08/2023 6.1 (L)  6.5 - 8.1 g/dL Final  . Albumin 54/06/8118 3.8  3.5 - 5.0 g/dL Final  . AST 14/78/2956 29  15 - 41 U/L Final  . ALT 05/08/2023 20  0 - 44  U/L Final  . Alkaline Phosphatase 05/08/2023 62  38 - 126 U/L Final  . Total Bilirubin 05/08/2023 1.3 (H)  0.3 - 1.2 mg/dL Final  . GFR, Estimated 05/08/2023 >60  >60 mL/min Final   Comment: (NOTE) Calculated using the CKD-EPI Creatinine Equation (2021)   . Anion gap 05/08/2023 10  5 - 15 Final   Performed at [facility], [address] [place], [place] [address]  Appointment on 04/30/2023  Component Date Value Ref Range Status  . Sodium 04/30/2023 138  135 - 145 mmol/L Final  . Potassium 04/30/2023 3.4 (L)  3.5 - 5.1 mmol/L Final  . Chloride 04/30/2023 104  98 - 111 mmol/L Final  . CO2 04/30/2023 23  22 - 32 mmol/L Final  . Glucose, Bld 04/30/2023 103 (H)  70 - 99 mg/dL Final   Glucose reference range applies only to samples taken after fasting for at least 8 hours.  . BUN 04/30/2023 19  8 - 23 mg/dL Final  . Creatinine, Ser 04/30/2023 0.92  0.44 - 1.00 mg/dL Final  . Calcium 65/78/4696 9.1  8.9 - 10.3 mg/dL Final  . Total Protein 04/30/2023 6.9  6.5 - 8.1 g/dL Final  . Albumin 29/52/8413 4.1  3.5 - 5.0 g/dL Final  . AST 24/40/1027 32  15 - 41 U/L Final  . ALT 04/30/2023 21  0 - 44 U/L Final  . Alkaline Phosphatase 04/30/2023 63  38 - 126 U/L Final  . Total Bilirubin 04/30/2023 1.1  0.3 - 1.2 mg/dL Final  . GFR, Estimated 04/30/2023 >60  >60 mL/min Final   Comment: (NOTE) Calculated using the CKD-EPI Creatinine Equation (2021)   . Anion gap 04/30/2023 11  5 - 15 Final   Performed at [facility], [address], [place], [place] [address]  . WBC 04/30/2023 4.7  4.0 - 10.5 K/uL Final  . RBC 04/30/2023 3.79 (L)  3.87 - 5.11 MIL/uL Final  . Hemoglobin 04/30/2023 11.6 (L)  12.0 - 15.0 g/dL Final  . HCT 44/12/4740 34.1 (L)  36.0 - 46.0 % Final  . MCV 04/30/2023 90.0  80.0 - 100.0 fL Final  . MCH 04/30/2023 30.6  26.0 - 34.0 pg Final  . MCHC 04/30/2023 34.0  30.0 - 36.0 g/dL Final  . RDW 59/56/3875 16.8 (H)  11.5 - 15.5 % Final  . Platelets  04/30/2023 61 (L)  150 - 400 K/uL Final   Comment: Immature Platelet Fraction may be clinically indicated, consider ordering this additional test [id number] REPEATED TO VERIFY PLATELET COUNT CONFIRMED BY SMEAR   .  nRBC 04/30/2023 0.0  0.0 - 0.2 % Final  . Neutrophils Relative % 04/30/2023 60  % Final  . Neutro Abs 04/30/2023 2.8  1.7 - 7.7 K/uL Final  . Lymphocytes Relative 04/30/2023 23  % Final  . Lymphs Abs 04/30/2023 1.1  0.7 - 4.0 K/uL Final  . Monocytes Relative 04/30/2023 13  % Final  . Monocytes Absolute 04/30/2023 0.6  0.1 - 1.0 K/uL Final  . Eosinophils Relative 04/30/2023 4  % Final  . Eosinophils Absolute 04/30/2023 0.2  0.0 - 0.5 K/uL Final  . Basophils Relative 04/30/2023 0  % Final  . Basophils Absolute 04/30/2023 0.0  0.0 - 0.1 K/uL Final  . Immature Granulocytes 04/30/2023 0  % Final  . Abs Immature Granulocytes 04/30/2023 0.02  0.00 - 0.07 K/uL Final   Performed at [facility], [address], [place], [place] [address]  . Magnesium 04/30/2023 1.9  1.7 - 2.4 mg/dL Final   Performed at [facility], [address] [place], [place] [address]    RADIOGRAPHIC STUDIES: I have personally reviewed the radiological images as listed and agree with the findings in the report  No results found.  ASSESSMENT/PLAN  67 y.o. female is here because of  colon cancer.  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 Mass was partially circumferential (involves < 1/3rd of the lumen circumference). 10 mm polyp in proximal ascending colon which was sessile.  Pathology ulcerated  mass- invasive moderately differentiated adenocarcinoma arising within a tubular adenoma with high-grade dysplasia   January 15 2023- CEA 2.9 Chromogranin A 564  January 17 2023- CT CAP  Short segment asymmetric wall thickening of the transverse colon measuring 3.3 cm in length.   Small adjacent soft tissue nodule/lymph nodes adjacent to the colonic wall thickening measuring 6 mm, suspicious  for local nodal disease. Prominent/mildly enlarged right upper quadrant lymph nodes similar to January 26, 2019 and favored reactive.  No distant metastatic disease.  Hepatomegaly with nodular hepatic contour, suggestive of cirrhosis Cholelithiasis without findings of acute cholecystitis.   Incidental right thyroid nodule   January 23 2023- Chromogranin A 294  February 04 2023:  Transverse colectomy.  Pathology showed adenocarcinoma invading visceral peritoneum, Grade 3.  All of 30 lymph nodes negative for tumor.  MSI intact Feb 25 2023- On basis of spontaneous improvement in chromogranin A will hold on Dodatate PET.     Therapeutics-  Feb 25 2023- Reviewed [facility] treatment guidelines with patient and husband.  They are agreeable to proceed with adjuvant CAPOX x 6 months.   Mar 11 2023- Cycle 1 XelOx Mar 20 2023- Tolerated cycle 1 well with only some mild cold neuropathy April 01 2023- Cycle 2 XelOx   April 22 2023: Cycle 3 XelOx.              April 30 2023- Experiencing mucositis, anorexia, nausea and diarrhea.    May 13 2023: Cycle 4 XelOX.  Dose reduced Xeloda from 2000 mg bid to 1500 mg bid due to GI symptoms.  This will be her last cycle of adjuvant therapy   Chemotherapy induced nausea and diarrhea  April 30 2023-  Instructed patient to do the following-- Take Ondansetron 8 mg, four times daily.  Take Imodium 4 mg every 4 hrs for diarrhea Use the magic mouthwash every 4 hours while awake.  Arranged for patient to receive IVF and antiemetics.  To follow up in 2 days to assess progress     May 02 2023- To  complete this course of Xeloda on July 15th.  Should improve with the week off between then and beginning of Cycle 4.  Anticipate dose reduction in Xeloda with Cycle 4.  Sx under better control today than at last visit due to better use of supportive care May 08 2023-  Continues to have 5 to 6 bowel movements daily.  Using imodium 2 mg q 5 hrs.  Instructed patient to use imodium 4 mg po q 4 hrs PRN diarrhea Adding lomotil up to qid PRN.  Has required potassium replacement due to diarrheal losses May 21 2023- Continues to have significant chemotherapy induced diarrhea.  Not using supportive care medications to maximum potential. Provided written instructions to patient and husband again today.  Adding Levaquin 500 mg daily.  Sending stool for C diff.  Referring to infusion for IVF.  Chemistries sent to guide electrolyte replacement.  Follow up in 2 days.   Sent testing to evaluate for DPD deficiency.  Discussed plans with clinical pharmacist.     Diarrhea:             January 15 2023- Not explained by the colon cancer.  No evidence of inflammatory bowel disease by colonoscopy.    Chromogranin A 564  January 23 2023- Repeat Chromogranin A improved  Feb 25 2023- Diarrhea and chromogranin A improved therefore holding on Dotatate PET/CT    Poor venous access:    Mar 11 2023- Port placement   Headaches Mar 07 2023- Improved following surgery likely related to a component of general anesthesia.  To see neurology  Possible cirrhosis  January 17 2023- Nodular liver contour noted on CT CAP    Cancer Staging  Colon cancer [facility]) Staging form: Colon and Rectum, AJCC 8th Edition - Clinical stage from 02/25/2023: Stage IIB (cT4a, cN0, cM0) - Signed by Loni Muse, MD on 02/25/2023 Histopathologic type: Adenocarcinoma, NOS Stage prefix: Initial diagnosis Total positive nodes: 0 Total nodes examined: 30 Histologic grade (G): G3 Histologic grading system: 4 grade system    No problem-specific  Assessment & Plan notes found for this encounter.    No orders of the defined types were placed in this encounter.   40  minutes was spent in patient care.  This included time spent preparing to see the patient (e.g., review of tests), obtaining and/or reviewing separately obtained history, counseling and educating the patient/family/caregiver, ordering medications, tests, or procedures; documenting clinical information in the electronic or other health record, independently interpreting results and communicating results to the patient/family/caregiver as well as coordination of care.       All questions were answered. The patient knows to call the clinic with any problems, questions or concerns.  This note was electronically signed.    Loni Muse, MD  05/21/2023 8:42 AM

## 2023-05-21 NOTE — Progress Notes (Deleted)
[facility] Cancer Initial Visit:  Patient Care Team: Hurshel Party, NP as PCP - General (Internal Medicine) Loni Muse, MD as Consulting Physician (Internal Medicine)  CHIEF COMPLAINTS/PURPOSE OF CONSULTATION:  Oncology History  Colon cancer [facility])  01/15/2023 Initial Diagnosis   Colon cancer (HCC)   02/25/2023 Cancer Staging   Staging form: Colon and Rectum, AJCC 8th Edition - Clinical stage from 02/25/2023: Stage IIB (cT4a, cN0, cM0) - Signed by Loni Muse, MD on 02/25/2023 Histopathologic type: Adenocarcinoma, NOS Stage prefix: Initial diagnosis Total positive nodes: 0 Total nodes examined: 30 Histologic grade (G): G3 Histologic grading system: 4 grade system   03/11/2023 -  Chemotherapy   Patient is on Treatment Plan : COLORECTAL Xelox (Capeox)(130/850) q21d       HISTORY OF PRESENTING ILLNESS: [name] 67 y.o. female is here because of  colon cancer Medical history notable for septic arthritis of the hip, obesity, atypical chest pain, skin cancer, carpal tunnel syndrome, chronic vaginitis, chronic venous and sufficiency, TTP, syncope  January 10, 2023: Colonoscopy-ulcerated 2 cm nonobstructing small mass with heaped up margins found in distal transverse colon.  Mass was partially circumferential (involves less than one third of the lumen circumference) no bleeding present.  10 mm polyp in proximal ascending colon which was sessile.  Pathology-Ascending colon polyp was compatible with a sessile serrated adenoma without cytologic dysplasia Transverse colon polyp-invasive moderately differentiated adenocarcinoma arising within a tubular adenoma with high-grade dysplasia  January 15 2023: [facility] Medical Oncology Consult Patient reports that the colonoscopy was performed for surveillance; however, she was also having episodes of intermittent diarrhea x 2 to 3 months.  Stools were not bloody, nor was she passing mucus but they were soft.  In  association with these episodes she would also feel dizzy and note an odd smell, nausea.   To see Dr. Logan Bores from surgery on January 22 2023   Social:  Married.  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  Tobacco quit 24 years.  Rare glass of wine  [facility] Mother alive 64 epileptic, dementia Father died 29 Alzheimers, prostate cancer Brother alive 31 arthritis requiring joint replacement,   WBC 6.4 hemoglobin 12.5 MCV 88 platelet count 178 normal differential CMP normal CEA 2.9 chromogranin A 564  January 17 2023:  CT CAP Short segment asymmetric wall thickening of the transverse colon measuring 3.3 cm in length, compatible with reported history of  primary colonic neoplasm.  Small adjacent soft tissue nodule/lymph nodes adjacent to the colonic wall thickening measuring 6 mm, nonspecific are suspicious  for local nodal disease. Prominent/mildly enlarged right upper quadrant lymph nodes are similar dating back to January 26, 2019 and favored reactive.  No convincing evidence of distant metastatic disease within the chest, abdomen or pelvis.  Hepatomegaly with nodular hepatic contour, suggestive of cirrhosis Cholelithiasis without findings of acute cholecystitis.   Incidental right thyroid nodule measuring   January 18 2023:  Presented to [facility] ED with headaches and chest discomfort.  CT PA negative for PE.  CT head without contrast negative.    January 22 2023:  Scheduled follow up for colon cancer.  Reviewed results of labs and imaging with patient and husband. Will repeat chromgranin A and refer for PET/CT if chromogranin A still significantly elevated.    January 23, 2023: Chromogranin A 294  February 04 2023:  Transverse colectomy Pathology showed adenocarcinoma invading visceral peritoneum, Grade 3.  All of 30 lymph nodes were negative for tumor.  Surgical Stage (T4a, N0 M0)  MSI intact  Feb 25, 2023:   Reviewed results of surgical pathology with patient and husband.  Per NCCN guidelines adjuvant  options are CAPEOX, FOLFOX 6 months, Capecitabine or observation.  Discussed risks and benefits of each  Mar 07 2023:   Bifrontal headaches have returned.  These began in October 2023 but resolved after her colon surgery.  Has seen neurology and underwent MRI brain negative for CNS lesion.  EEG normal.   (Raises the question of what medications in perioperative period helped with the HA's.)  Recovering from a dog bite on dorsum of right hand.  Will receive Capecitabine today.    Mar 11 2023:  Port placement.  Cycle 1 XelOx   Mar 19 2023:  Neurology follow up.    Mar 20 2023:   Agree with Neurology assessment that neurocognitive testing should not be performed it at all, until well after chemotherapy has been completed.  Experienced some mild nausea helped by antiemetics.   Has occasional HA's.  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   No HFS.     April 01 2023:  Cycle 2 XelOx  April 17 2023:  Has lost 4 lbs.  Appetite diminished due to dysgeusia.  No metallic taste.  Edges of tongue get red and sore and painful.  Mouth feels dry despite drinking a lot of water and tea.  Not using anything for her mouth sores.   Fatigue increased.  Has cold neuropathy and cold induced pharyngospasm.      Begin Magic mouthwash for mucositis.     April 22 2023:  Cycle 3 XelOx.    April 30 2023:    Patient called office yesterday stating that she was anorectic, nauseated, having mouth sores and diarrhea.  Has lost 13 lbs.  Has difficulty remembering details which complicates history taking.  States that anorexia began after starting this round of chemotherapy.  "Food does not get past my tongue"; did not recognize this was nausea so did not begin taking antiemetics until 3 days ago.  Only taking antiemetics bid.  Mouth feels dry but not painful.  No emesis.  Began with diarrhea in the form of 5 to 6 stools per day but does not believe that she is taking imodium for it.  Dysgeusia  even to water.   Not using magic mouthwash regularly  May 02 2023:    Diarrhea improved with imodium.  Using ondansetron alternating with compazine; no emesis with improvement in nausea.  Eating limited by mucositis which she can't say is helped by magic mouthwash.  Not eating cold foods.  Has gained 2 lbs in the last two days.  To complete the course of Xeloda on May 06 2023.    WBC 4.7 hemoglobin 11.6 platelet count 61; 60 segs 23 lymphs 13 monos 4 eos CMP notable for potassium 3.4 glucose 103  May 08 2023:   Scheduled follow-up regarding colon cancer. Has lost 4 lbs since last visit owing to anorexia.   No mouth sores but has dysgeusia and mouth feels dry.  Using magic mouthwash.  Nauseated but without emesis.  Continues to have 5 to 6 bowel movements daily.  Using imodium 2 mg q 5 hrs.    Instructed patient to use imodium 4 mg po q 4 hrs PRN diarrhea Adding lomotil up to qid PRN Xeloda has not been shipped to patient.   Will dose reduce Xeloda from 2000 mg bid to  1500 mg bid.    May 13 2023:  Cycle 4 XelOX  Review of Systems  Constitutional:  Negative for appetite change, chills, fatigue, fever and unexpected weight change.       Has gained weight due to change in diet  HENT:   Negative for mouth sores, nosebleeds, sore throat, tinnitus and trouble swallowing.   Eyes:  Negative for eye problems and icterus.       Vision changes:  None  Respiratory:  Negative for chest tightness, hemoptysis, shortness of breath and wheezing.        One month of nonproductive cough.  No history of seasonable allergies  Cardiovascular:  Negative for chest pain and leg swelling.       Intermittent palpitations.  Chronic LE edema  Endocrine: Negative for hot flashes.       Cold intolerance:  none Heat intolerance:  none  Genitourinary:  Negative for bladder incontinence, difficulty urinating, dysuria, frequency, hematuria and nocturia.   Musculoskeletal:  Negative for arthralgias, back pain, gait  problem, myalgias, neck pain and neck stiffness.  Skin:  Negative for itching, rash and wound.  Neurological:  Negative for dizziness, extremity weakness, gait problem, headaches, numbness, seizures and speech difficulty.  Hematological:  Negative for adenopathy. Bruises/bleeds easily.  Psychiatric/Behavioral:  Negative for sleep disturbance and suicidal ideas. The patient is not nervous/anxious.     MEDICAL HISTORY: Past Medical History:  Diagnosis Date  . Abnormal glucose   . Achilles tendinitis of left lower extremity   . Acute suppurative arthritis due to bacteria (HCC) 03/09/2011   Overview:  Last Assessment & Plan:  Methicillin sensitive staphylococcal aureus septic hip. She clearly does need I&D of the hip. I will have her continue the doxycycline for now but stop it 7 days prior to her surgery to maximize the yield on the cultures in the operating room though I be shocked if we don't find methicillin sensitive staph aureus again.. I agree with removing as much of the pros  . Anal fissure 11/30/2015  . Arthritis    right hip  . Atypical chest pain 04/09/2016  . Bilateral edema of lower extremity   . BMI 39.0-39.9,adult   . Cancer [facility])    SKIN CANCER  . Carpal tunnel syndrome 03/09/2011   Overview:  Last Assessment & Plan:  Clinically this seems to be carpal tunnel syndrome. Giving given her history of infection though we can keep in the back of our mind the idea that she will have a cervical spine problem but I think this is unlikely at this point in time medics carpal tunnel syndrome fits clinically this was a positive Tinel's sign however in for a brace for her.  . Chronic vaginitis   . Chronic venous insufficiency   . Depression   . Dyslipidemia   . Fatigue 10/13/2015  . Fibromyalgia   . GERD without esophagitis   . History of immune thrombocytopenia 03/09/2011  . History of operative procedure on hip 03/10/2012  . History of TTP (thrombotic thrombocytopenic purpura)   .  Hypokalemia   . Lichen sclerosus et atrophicus of the vulva 11/30/2015  . Major depressive disorder, recurrent, moderate (HCC)   . Methicillin susceptible Staphylococcus aureus infection 03/09/2011   Overview:  Last Assessment & Plan:  This is undoubtedlythe culprit organism  . Migraine   . Morbid obesity with BMI of 45.0-49.9, adult (HCC) 10/13/2015  . Morbidly obese (HCC)   . MSSA (methicillin susceptible Staphylococcus aureus) infection   . Near  syncope   . Osteoarthritis, chronic   . Pain due to total hip replacement (HCC) 09/20/2011  . Palpitations 03/04/2019  . Postmenopausal   . 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 Snoring 11/22/2015  . T.T.P. syndrome (HCC)    20 yrs ago-not seen anyone for 10 yrs.  . Vitamin D deficiency   . 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    SURGICAL HISTORY: Past Surgical History:  Procedure Laterality Date  . COLONOSCOPY  03/01/2011   Mild melanosis coli. Mild diverticulosis. Small internal hemorrhoids. Healed anal fissure  . ENDOVENOUS ABLATION SAPHENOUS VEIN W/ LASER Right 04/02/2019   endovenous laser ablation right greater saphenous vein by Waverly Ferrari MD   . JOINT REPLACEMENT  08/2009   right hip replacement  . REVISION TOTAL HIP ARTHROPLASTY    . TONSILLECTOMY  1963  . TOTAL HIP REVISION  10/08/2011   Procedure: TOTAL HIP REVISION;  Surgeon: [name];  Location: [facility];  Service: Orthopedics;  Laterality: Right;  Resection of Right Total Hip/Extended Trochanteric Osteotomy/Placement of Antibiotic Total Hip Cemented by Depuy  . TOTAL HIP REVISION  03/10/2012   Procedure: TOTAL HIP REVISION;  Surgeon: [name], MD;  Location: [facility]S;  Service: Orthopedics;  Laterality: Right;  Reimplantation/Revision of a Right Total Hip and Removal of Cemented Implant    SOCIAL HISTORY: Social History   Socioeconomic History  . Marital status: Married    Spouse name: Raiford Noble   . Number of children: Not on file  . Years of education: 12+7  . Highest education level: Bachelor's degree (e.g., BA, AB, BS)  Occupational History  . Occupation: Magazine features editor: [employer]  Tobacco Use  . Smoking status: Former    Current packs/day: 0.00    Average packs/day: 1.5 packs/day for 20.0 years (30.0 ttl pk-yrs)    Types: Cigarettes    Start date: 10/04/1970    Quit date: 10/04/1990    Years since quitting: 32.6  . Smokeless tobacco: Former    Quit date: 03/09/1991  Vaping Use  . Vaping status: Never Used  Substance and Sexual Activity  . Alcohol use: Yes    Comment: OCCASIONAL  . Drug use: No  . Sexual activity: Yes    Partners: Male  Other Topics Concern  . Not on file  Social History Narrative  . Not on file   Social Determinants of Health   Financial Resource Strain: Not on file  Food Insecurity: Low Risk  (02/04/2023)   [facility] from [facility]   Food vital sign   . Within the past 12 months, you worried that your food would run out before you got money to buy more: Never true   . Within the past 12 months, the food you bought just didn't last and you didn't have money to get more. : Never true  Transportation Needs: Not on file (02/04/2023)  Physical Activity: Not on file  Stress: Not on file  Social Connections: Not on file  Intimate Partner Violence: Not on file    FAMILY HISTORY Family History  Problem Relation Age of Onset  . Hypertension Mother   . Hypertension Father   . Alzheimer's disease Father   . Prostate cancer Father   . Hypertension Maternal Grandfather   . Seizures Other   . Colon cancer Neg Hx   . Stomach cancer Neg Hx   . Rectal cancer Neg Hx   .  Esophageal cancer Neg Hx     ALLERGIES:  has No Known Allergies.  MEDICATIONS:  Current Outpatient Medications  Medication Sig Dispense Refill  . aluminum-magnesium hydroxide 200-200 MG/5ML suspension Take 15 mLs by mouth every 6 (six) hours as  needed for indigestion.    . AMBULATORY NON FORMULARY MEDICATION Medication Name: Magic Mouth Wash 3 parts Mylanta/Maalox 2 parts Benadryl 1 part Viscous Lidocaine  Swish and Swallow 5 mL every 3-4 hours as needed 240 mL 0  . buPROPion (WELLBUTRIN XL) 150 MG 24 hr tablet Take 150 mg by mouth every evening.    . capecitabine (XELODA) 500 MG tablet Take 3 tablets (1,500 mg total) by mouth 2 (two) times daily after a meal. Take 14 days on, then 7 days off, repeat every 21 days. 180 tablet 3  . clobetasol cream (TEMOVATE) 0.05 % Apply 1 Application topically as needed.    . clotrimazole-betamethasone (LOTRISONE) cream Apply 1 Application topically 2 (two) times daily.    . diphenoxylate-atropine (LOMOTIL) 2.5-0.025 MG tablet Take 1 tablet by mouth 4 (four) times daily as needed for diarrhea or loose stools. 120 tablet 1  . divalproex (DEPAKOTE) 250 MG DR tablet Take 250 mg by mouth 2 (two) times daily.    . DULoxetine (CYMBALTA) 60 MG capsule Take 60 mg by mouth at bedtime.     [name] Kitchen esomeprazole (NEXIUM) 40 MG capsule Take 40 mg by mouth daily. Pt states has been on for years    . ibuprofen (ADVIL) 200 MG tablet Take 200 mg by mouth every 6 (six) hours as needed.    . magic mouthwash (lidocaine, diphenhydrAMINE, alum & mag hydroxide) suspension Swish and swallow 5 mLs every 4 (four) hours as needed for mouth pain. 240 mL 0  . magic mouthwash (lidocaine, diphenhydrAMINE, alum & mag hydroxide) suspension Swish and swallow 5 mLs every 4 (four) hours as needed for mouth pain. 240 mL 0  . meloxicam (MOBIC) 7.5 MG tablet Take 7.5 mg by mouth every morning.    . Multiple Vitamin (MULTIVITAMIN WITH MINERALS) TABS tablet Take 1 tablet by mouth daily.    . nortriptyline (PAMELOR) 10 MG capsule Take 10 mg by mouth.    . ondansetron (ZOFRAN) 8 MG tablet Take 1 tablet by mouth every 8 hours as needed for nausea or vomiting. Start on the third day after chemotherapy. 30 tablet 1  . oxyCODONE (OXY IR/ROXICODONE) 5  MG immediate release tablet SMARTSIG:5 Milligram(s) By Mouth Every 4 Hours PRN    . potassium chloride SA (KLOR-CON M) 20 MEQ tablet Take 3 tablet today, then start 1 tablet twice daily 64 tablet 0  . prochlorperazine (COMPAZINE) 10 MG tablet Take 1 tablet (10 mg total) by mouth every 6 (six) hours as needed for nausea or vomiting. 30 tablet 1  . SUMAtriptan (IMITREX) 25 MG tablet Take 25 mg by mouth.     Current Facility-Administered Medications  Medication Dose Route Frequency Provider Last Rate Last Admin  . triamcinolone acetonide (KENALOG-40) injection 20 mg  20 mg Other Once Asencion Islam, DPM        PHYSICAL EXAMINATION:  ECOG PERFORMANCE STATUS: 1 - Symptomatic but completely ambulatory   There were no vitals filed for this visit.    There were no vitals filed for this visit.     Physical Exam Vitals and nursing note reviewed.  Constitutional:      General: She is not in acute distress.    Appearance: Normal appearance. She is obese. She is  not ill-appearing, toxic-appearing or diaphoretic.     Comments: Here alone.    HENT:     Head: Normocephalic and atraumatic.     Right Ear: External ear normal.     Left Ear: External ear normal.     Nose: Nose normal. No congestion or rhinorrhea.  Eyes:     General: No scleral icterus.    Extraocular Movements: Extraocular movements intact.     Conjunctiva/sclera: Conjunctivae normal.     Pupils: Pupils are equal, round, and reactive to light.  Cardiovascular:     Rate and Rhythm: Normal rate.     Heart sounds: No murmur heard.    No friction rub. No gallop.  Abdominal:     General: Bowel sounds are normal.     Palpations: Abdomen is soft.     Tenderness: There is no abdominal tenderness. There is no guarding or rebound.  Musculoskeletal:        General: No swelling, tenderness or deformity.     Cervical back: Normal range of motion and neck supple. No rigidity or tenderness.  Lymphadenopathy:     Head:     Right  side of head: No submental, submandibular, tonsillar, preauricular, posterior auricular or occipital adenopathy.     Left side of head: No submental, submandibular, tonsillar, preauricular, posterior auricular or occipital adenopathy.     Cervical: No cervical adenopathy.     Right cervical: No superficial, deep or posterior cervical adenopathy.    Left cervical: No superficial, deep or posterior cervical adenopathy.     Upper Body:     Right upper body: No supraclavicular, axillary, pectoral or epitrochlear adenopathy.     Left upper body: No supraclavicular, axillary, pectoral or epitrochlear adenopathy.  Skin:    General: Skin is warm.     Coloration: Skin is not jaundiced.  Neurological:     General: No focal deficit present.     Mental Status: She is alert and oriented to person, place, and time. Mental status is at baseline.     Cranial Nerves: No cranial nerve deficit.  Psychiatric:        Mood and Affect: Mood normal.        Behavior: Behavior normal.        Thought Content: Thought content normal.        Judgment: Judgment normal.     LABORATORY DATA: I have personally reviewed the data as listed:  Abstract on 05/13/2023  Component Date Value Ref Range Status  . Hemoglobin 05/13/2023 10.8 (A)  12.0 - 16.0 Final  . HCT 05/13/2023 31 (A)  36 - 46 Final  . Neutrophils Absolute 05/13/2023 2.84   Final  . Platelets 05/13/2023 91 (A)  150 - 400 K/uL Final  . WBC 05/13/2023 3.5   Final  . RBC 05/13/2023 3.43 (A)  3.87 - 5.11 Final  . Glucose 05/13/2023 148   Final  . BUN 05/13/2023 9  4 - 21 Final  . CO2 05/13/2023 29 (A)  13 - 22 Final  . Creatinine 05/13/2023 0.6  0.5 - 1.1 Final  . Potassium 05/13/2023 3.3 (A)  3.5 - 5.1 mEq/L Final  . Sodium 05/13/2023 137  137 - 147 Final  . Chloride 05/13/2023 102  99 - 108 Final  . Calcium 05/13/2023 9.1  8.7 - 10.7 Final  . Albumin 05/13/2023 3.6  3.5 - 5.0 Final  . Alkaline Phosphatase 05/13/2023 82  25 - 125 Final  . ALT  05/13/2023 18  7 -  35 U/L Final  . AST 05/13/2023 38 (A)  13 - 35 Final  . Bilirubin, Total 05/13/2023 1.1   Final  . Magnesium 05/13/2023 1.90   Final  Appointment on 05/09/2023  Component Date Value Ref Range Status  . Potassium 05/09/2023 3.0 (L)  3.5 - 5.1 mmol/L Final   Performed at [facility], [address], [place], [place] [address]  . Magnesium 05/09/2023 1.7  1.7 - 2.4 mg/dL Final   Performed at [facility], [address] [place], [place] [address]  Appointment on 05/08/2023  Component Date Value Ref Range Status  . Magnesium 05/08/2023 1.9  1.7 - 2.4 mg/dL Final   Performed at [facility], [address], [place], [place] [address]  Office Visit on 05/08/2023  Component Date Value Ref Range Status  . WBC 05/08/2023 3.3 (L)  4.0 - 10.5 K/uL Final  . RBC 05/08/2023 3.26 (L)  3.87 - 5.11 MIL/uL Final  . Hemoglobin 05/08/2023 10.2 (L)  12.0 - 15.0 g/dL Final  . HCT 59/56/3875 29.7 (L)  36.0 - 46.0 % Final  . MCV 05/08/2023 91.1  80.0 - 100.0 fL Final  . MCH 05/08/2023 31.3  26.0 - 34.0 pg Final  . MCHC 05/08/2023 34.3  30.0 - 36.0 g/dL Final  . RDW 64/33/2951 17.4 (H)  11.5 - 15.5 % Final  . Platelets 05/08/2023 89 (L)  150 - 400 K/uL Final   Comment: SPECIMEN CHECKED FOR CLOTS Immature Platelet Fraction may be clinically indicated, consider ordering this additional test [id number] REPEATED TO VERIFY PLATELET COUNT CONFIRMED BY SMEAR   . nRBC 05/08/2023 0.0  0.0 - 0.2 % Final  . Neutrophils Relative % 05/08/2023 52  % Final  . Neutro Abs 05/08/2023 1.7  1.7 - 7.7 K/uL Final  . Lymphocytes Relative 05/08/2023 21  % Final  . Lymphs Abs 05/08/2023 0.7  0.7 - 4.0 K/uL Final  . Monocytes Relative 05/08/2023 23  % Final  . Monocytes Absolute 05/08/2023 0.8  0.1 - 1.0 K/uL Final  . Eosinophils Relative 05/08/2023 4  % Final  . Eosinophils Absolute 05/08/2023 0.1  0.0 - 0.5 K/uL Final  . Basophils Relative  05/08/2023 0  % Final  . Basophils Absolute 05/08/2023 0.0  0.0 - 0.1 K/uL Final  . Immature Granulocytes 05/08/2023 0  % Final  . Abs Immature Granulocytes 05/08/2023 0.01  0.00 - 0.07 K/uL Final   Performed at [facility], [address] [place], [place] [address]  . Sodium 05/08/2023 138  135 - 145 mmol/L Final  . Potassium 05/08/2023 2.5 (LL)  3.5 - 5.1 mmol/L Final   Comment: CRITICAL RESULT CALLED TO, READ BACK BY AND VERIFIED WITH DUNLAP, K RN @ 1432 ON 05/08/2023 BY ABDULHALIM,M   . Chloride 05/08/2023 102  98 - 111 mmol/L Final  . CO2 05/08/2023 26  22 - 32 mmol/L Final  . Glucose, Bld 05/08/2023 100 (H)  70 - 99 mg/dL Final   Glucose reference range applies only to samples taken after fasting for at least 8 hours.  . BUN 05/08/2023 12  8 - 23 mg/dL Final  . Creatinine, Ser 05/08/2023 0.86  0.44 - 1.00 mg/dL Final  . Calcium 10/93/2355 8.8 (L)  8.9 - 10.3 mg/dL Final  . Total Protein 05/08/2023 6.1 (L)  6.5 - 8.1 g/dL Final  . Albumin 73/22/0254 3.8  3.5 - 5.0 g/dL Final  . AST 27/03/2375 29  15 - 41 U/L  Final  . ALT 05/08/2023 20  0 - 44 U/L Final  . Alkaline Phosphatase 05/08/2023 62  38 - 126 U/L Final  . Total Bilirubin 05/08/2023 1.3 (H)  0.3 - 1.2 mg/dL Final  . GFR, Estimated 05/08/2023 >60  >60 mL/min Final   Comment: (NOTE) Calculated using the CKD-EPI Creatinine Equation (2021)   . Anion gap 05/08/2023 10  5 - 15 Final   Performed at [facility], [address], [place], [place] [address]  Appointment on 04/30/2023  Component Date Value Ref Range Status  . Sodium 04/30/2023 138  135 - 145 mmol/L Final  . Potassium 04/30/2023 3.4 (L)  3.5 - 5.1 mmol/L Final  . Chloride 04/30/2023 104  98 - 111 mmol/L Final  . CO2 04/30/2023 23  22 - 32 mmol/L Final  . Glucose, Bld 04/30/2023 103 (H)  70 - 99 mg/dL Final   Glucose reference range applies only to samples taken after fasting for at least 8 hours.  . BUN 04/30/2023 19  8 - 23  mg/dL Final  . Creatinine, Ser 04/30/2023 0.92  0.44 - 1.00 mg/dL Final  . Calcium 96/29/5284 9.1  8.9 - 10.3 mg/dL Final  . Total Protein 04/30/2023 6.9  6.5 - 8.1 g/dL Final  . Albumin 13/24/4010 4.1  3.5 - 5.0 g/dL Final  . AST 27/25/3664 32  15 - 41 U/L Final  . ALT 04/30/2023 21  0 - 44 U/L Final  . Alkaline Phosphatase 04/30/2023 63  38 - 126 U/L Final  . Total Bilirubin 04/30/2023 1.1  0.3 - 1.2 mg/dL Final  . GFR, Estimated 04/30/2023 >60  >60 mL/min Final   Comment: (NOTE) Calculated using the CKD-EPI Creatinine Equation (2021)   . Anion gap 04/30/2023 11  5 - 15 Final   Performed at [facility], [address], [place], [place] [address]  . WBC 04/30/2023 4.7  4.0 - 10.5 K/uL Final  . RBC 04/30/2023 3.79 (L)  3.87 - 5.11 MIL/uL Final  . Hemoglobin 04/30/2023 11.6 (L)  12.0 - 15.0 g/dL Final  . HCT 42/59/5638 34.1 (L)  36.0 - 46.0 % Final  . MCV 04/30/2023 90.0  80.0 - 100.0 fL Final  . MCH 04/30/2023 30.6  26.0 - 34.0 pg Final  . MCHC 04/30/2023 34.0  30.0 - 36.0 g/dL Final  . RDW 75/64/3329 16.8 (H)  11.5 - 15.5 % Final  . Platelets 04/30/2023 61 (L)  150 - 400 K/uL Final   Comment: Immature Platelet Fraction may be clinically indicated, consider ordering this additional test [id number] REPEATED TO VERIFY PLATELET COUNT CONFIRMED BY SMEAR   . nRBC 04/30/2023 0.0  0.0 - 0.2 % Final  . Neutrophils Relative % 04/30/2023 60  % Final  . Neutro Abs 04/30/2023 2.8  1.7 - 7.7 K/uL Final  . Lymphocytes Relative 04/30/2023 23  % Final  . Lymphs Abs 04/30/2023 1.1  0.7 - 4.0 K/uL Final  . Monocytes Relative 04/30/2023 13  % Final  . Monocytes Absolute 04/30/2023 0.6  0.1 - 1.0 K/uL Final  . Eosinophils Relative 04/30/2023 4  % Final  . Eosinophils Absolute 04/30/2023 0.2  0.0 - 0.5 K/uL Final  . Basophils Relative 04/30/2023 0  % Final  . Basophils Absolute 04/30/2023 0.0  0.0 - 0.1 K/uL Final  . Immature Granulocytes 04/30/2023 0  % Final  . Abs Immature  Granulocytes 04/30/2023 0.02  0.00 - 0.07 K/uL Final   Performed at [facility],  [address], [place], [place] [address]  . Magnesium 04/30/2023 1.9  1.7 - 2.4 mg/dL Final   Performed at [facility], [address], [place], [place] [address]    RADIOGRAPHIC STUDIES: I have personally reviewed the radiological images as listed and agree with the findings in the report  No results found.  ASSESSMENT/PLAN  67 y.o. female is here because of  colon cancer.  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 Mass was partially circumferential (involves < 1/3rd of the lumen circumference). 10 mm polyp in proximal ascending colon which was sessile.  Pathology ulcerated mass- invasive moderately differentiated adenocarcinoma arising within a tubular adenoma with high-grade dysplasia   January 15 2023- CEA 2.9 Chromogranin A 564  January 17 2023- CT CAP  Short segment asymmetric wall thickening of the transverse colon measuring 3.3 cm in length.   Small adjacent soft tissue nodule/lymph nodes adjacent to the colonic wall thickening measuring 6 mm, suspicious  for local nodal disease. Prominent/mildly enlarged right upper quadrant lymph nodes similar to January 26, 2019 and favored reactive.  No distant metastatic disease.  Hepatomegaly with nodular hepatic contour, suggestive of cirrhosis Cholelithiasis without findings of acute cholecystitis.   Incidental right thyroid nodule   January 23 2023- Chromogranin A 294  February 04 2023:  Transverse colectomy.  Pathology showed adenocarcinoma invading visceral  peritoneum, Grade 3.  All of 30 lymph nodes negative for tumor.  MSI intact Feb 25 2023- On basis of spontaneous improvement in chromogranin A will hold on Dodatate PET.     Therapeutics-  Feb 25 2023- Reviewed [facility] treatment guidelines with patient and husband.  They are agreeable to proceed with adjuvant CAPOX x 6 months.   Mar 11 2023- Cycle 1 XelOx Mar 20 2023- Tolerated cycle 1 well with only some mild cold neuropathy April 01 2023- Cycle 2 XelOx   April 22 2023: Cycle 3 XelOx.              April 30 2023- Experiencing mucositis, anorexia, nausea and diarrhea.    May 13 2023: Cycle 4 XelOX Will dose reduce Xeloda from 2000 mg bid to 1500 mg bid due to GI symptoms.  This will be her last cycle of adjuvant therapy   Chemotherapy induced nausea and diarrhea             April 30 2023-  Instructed patient to do the following-- Take Ondansetron 8 mg, four times daily.  Take Imodium 4 mg every 4 hrs for diarrhea Use the magic mouthwash every 4 hours while awake.  Arranged for patient to receive IVF and antiemetics.  To follow up in 2 days to assess progress     May 02 2023- To complete this course of Xeloda on July 15th.  Should improve with the week off between then and beginning of Cycle 4.  Anticipate dose reduction in Xeloda with Cycle 4.  Sx under better control today than at last visit due to better use of supportive care May 08 2023-  Continues to have 5 to 6 bowel movements daily.  Using imodium 2 mg q 5 hrs.  Instructed patient to use imodium  4 mg po q 4 hrs PRN diarrhea Adding lomotil up to qid PRN.  Has required potassium replacement due to diarrheal losses     Diarrhea:             January 15 2023- Not explained by the colon cancer.  No evidence of inflammatory bowel disease by colonoscopy.    Chromogranin A 564  January 23 2023- Repeat Chromogranin A improved  Feb 25 2023- Diarrhea and chromogranin A improved therefore holding on Dotatate PET/CT    Poor venous access:    Mar 11 2023- Port  placement   Headaches Mar 07 2023- Improved following surgery likely related to a component of general anesthesia.  To see neurology  Possible cirrhosis  January 17 2023- Nodular liver contour noted on CT CAP    Cancer Staging  Colon cancer [facility]) Staging form: Colon and Rectum, AJCC 8th Edition - Clinical stage from 02/25/2023: Stage IIB (cT4a, cN0, cM0) - Signed by Loni Muse, MD on 02/25/2023 Histopathologic type: Adenocarcinoma, NOS Stage prefix: Initial diagnosis Total positive nodes: 0 Total nodes examined: 30 Histologic grade (G): G3 Histologic grading system: 4 grade system    No problem-specific Assessment & Plan notes found for this encounter.    No orders of the defined types were placed in this encounter.   40  minutes was spent in patient care.  This included time spent preparing to see the patient (e.g., review of tests), obtaining and/or reviewing separately obtained history, counseling and educating the patient/family/caregiver, ordering medications, tests, or procedures; documenting clinical information in the electronic or other health record, independently interpreting results and communicating results to the patient/family/caregiver as well as coordination of care.       All questions were answered. The patient knows to call the clinic with any problems, questions or concerns.  This note was electronically signed.    Loni Muse, MD  05/21/2023 8:27 AM

## 2023-05-21 NOTE — Patient Instructions (Addendum)
For diarrhea please do the following 1) Use imodium 4 mg every 4 hrs  2) Lomotil 4 times daily 3) Ondansetron 8 mg 4 times daily 4) Begin Levaquin 500 mg daily x 1 week

## 2023-05-22 ENCOUNTER — Ambulatory Visit: Payer: Medicare PPO | Admitting: Oncology

## 2023-05-22 ENCOUNTER — Other Ambulatory Visit: Payer: Self-pay

## 2023-05-22 ENCOUNTER — Other Ambulatory Visit: Payer: Medicare PPO

## 2023-05-22 ENCOUNTER — Telehealth: Payer: Self-pay

## 2023-05-22 ENCOUNTER — Ambulatory Visit: Payer: Medicare PPO

## 2023-05-22 NOTE — Telephone Encounter (Signed)
Pt fell backwards on steps outside. She hit her right elbow, and knee. She didn't hit her head. The skin tear to her right elbow is approximately the size of a 50 cent piece. They cleaned area with wound wash, applied neosporin and sterile bandage. She wants to know if you would like to see her or offer any other suggestions? She also wanted to know if she could go swimming? I advised her against that with open area. I encouraged them not to use a band aid on area, as could cause more trauma. I recommended they use a non adherent bandage and cover with [place]ling, secure w/tape. Please advise.

## 2023-05-23 ENCOUNTER — Encounter: Payer: Self-pay | Admitting: Oncology

## 2023-05-23 ENCOUNTER — Inpatient Hospital Stay: Payer: Medicare PPO | Attending: Oncology | Admitting: Oncology

## 2023-05-23 ENCOUNTER — Ambulatory Visit: Payer: Medicare PPO

## 2023-05-23 ENCOUNTER — Inpatient Hospital Stay: Payer: Medicare PPO

## 2023-05-23 VITALS — BP 115/67 | HR 112 | Temp 98.4°F | Resp 14 | Ht 65.9 in | Wt 215.8 lb

## 2023-05-23 DIAGNOSIS — E876 Hypokalemia: Secondary | ICD-10-CM | POA: Insufficient documentation

## 2023-05-23 DIAGNOSIS — C184 Malignant neoplasm of transverse colon: Secondary | ICD-10-CM | POA: Insufficient documentation

## 2023-05-23 DIAGNOSIS — F1090 Alcohol use, unspecified, uncomplicated: Secondary | ICD-10-CM | POA: Diagnosis not present

## 2023-05-23 DIAGNOSIS — Z79899 Other long term (current) drug therapy: Secondary | ICD-10-CM | POA: Diagnosis not present

## 2023-05-23 DIAGNOSIS — D6959 Other secondary thrombocytopenia: Secondary | ICD-10-CM | POA: Insufficient documentation

## 2023-05-23 DIAGNOSIS — C189 Malignant neoplasm of colon, unspecified: Secondary | ICD-10-CM

## 2023-05-23 DIAGNOSIS — Z5111 Encounter for antineoplastic chemotherapy: Secondary | ICD-10-CM | POA: Insufficient documentation

## 2023-05-23 DIAGNOSIS — D649 Anemia, unspecified: Secondary | ICD-10-CM | POA: Insufficient documentation

## 2023-05-23 DIAGNOSIS — G928 Other toxic encephalopathy: Secondary | ICD-10-CM | POA: Diagnosis not present

## 2023-05-23 DIAGNOSIS — F32A Depression, unspecified: Secondary | ICD-10-CM | POA: Diagnosis not present

## 2023-05-23 DIAGNOSIS — Z23 Encounter for immunization: Secondary | ICD-10-CM | POA: Insufficient documentation

## 2023-05-23 DIAGNOSIS — K219 Gastro-esophageal reflux disease without esophagitis: Secondary | ICD-10-CM | POA: Diagnosis not present

## 2023-05-23 DIAGNOSIS — D61818 Other pancytopenia: Secondary | ICD-10-CM | POA: Diagnosis not present

## 2023-05-23 DIAGNOSIS — R531 Weakness: Secondary | ICD-10-CM | POA: Insufficient documentation

## 2023-05-23 DIAGNOSIS — R197 Diarrhea, unspecified: Secondary | ICD-10-CM | POA: Diagnosis not present

## 2023-05-23 DIAGNOSIS — F05 Delirium due to known physiological condition: Secondary | ICD-10-CM

## 2023-05-23 DIAGNOSIS — J323 Chronic sphenoidal sinusitis: Secondary | ICD-10-CM | POA: Diagnosis not present

## 2023-05-23 DIAGNOSIS — T451X5A Adverse effect of antineoplastic and immunosuppressive drugs, initial encounter: Secondary | ICD-10-CM

## 2023-05-23 DIAGNOSIS — G9340 Encephalopathy, unspecified: Secondary | ICD-10-CM | POA: Diagnosis not present

## 2023-05-23 DIAGNOSIS — Z09 Encounter for follow-up examination after completed treatment for conditions other than malignant neoplasm: Secondary | ICD-10-CM

## 2023-05-23 DIAGNOSIS — R918 Other nonspecific abnormal finding of lung field: Secondary | ICD-10-CM | POA: Diagnosis not present

## 2023-05-23 DIAGNOSIS — R6520 Severe sepsis without septic shock: Secondary | ICD-10-CM | POA: Diagnosis not present

## 2023-05-23 DIAGNOSIS — E860 Dehydration: Secondary | ICD-10-CM | POA: Insufficient documentation

## 2023-05-23 DIAGNOSIS — D594 Other nonautoimmune hemolytic anemias: Secondary | ICD-10-CM | POA: Insufficient documentation

## 2023-05-23 DIAGNOSIS — R410 Disorientation, unspecified: Secondary | ICD-10-CM | POA: Diagnosis not present

## 2023-05-23 DIAGNOSIS — K7460 Unspecified cirrhosis of liver: Secondary | ICD-10-CM

## 2023-05-23 DIAGNOSIS — G9341 Metabolic encephalopathy: Secondary | ICD-10-CM | POA: Diagnosis not present

## 2023-05-23 DIAGNOSIS — M3119 Other thrombotic microangiopathy: Secondary | ICD-10-CM | POA: Diagnosis not present

## 2023-05-23 DIAGNOSIS — K521 Toxic gastroenteritis and colitis: Secondary | ICD-10-CM | POA: Diagnosis not present

## 2023-05-23 DIAGNOSIS — E8729 Other acidosis: Secondary | ICD-10-CM | POA: Diagnosis not present

## 2023-05-23 DIAGNOSIS — D61810 Antineoplastic chemotherapy induced pancytopenia: Secondary | ICD-10-CM | POA: Diagnosis not present

## 2023-05-23 DIAGNOSIS — Z043 Encounter for examination and observation following other accident: Secondary | ICD-10-CM | POA: Diagnosis not present

## 2023-05-23 DIAGNOSIS — Z87891 Personal history of nicotine dependence: Secondary | ICD-10-CM | POA: Diagnosis not present

## 2023-05-23 DIAGNOSIS — A419 Sepsis, unspecified organism: Secondary | ICD-10-CM | POA: Diagnosis not present

## 2023-05-23 DIAGNOSIS — K1230 Oral mucositis (ulcerative), unspecified: Secondary | ICD-10-CM | POA: Diagnosis not present

## 2023-05-23 DIAGNOSIS — R932 Abnormal findings on diagnostic imaging of liver and biliary tract: Secondary | ICD-10-CM | POA: Insufficient documentation

## 2023-05-23 DIAGNOSIS — Z9049 Acquired absence of other specified parts of digestive tract: Secondary | ICD-10-CM | POA: Diagnosis not present

## 2023-05-23 NOTE — Progress Notes (Deleted)
[facility] Cancer Initial Visit:  Patient Care Team: Hurshel Party, NP as PCP - General (Internal Medicine) Loni Muse, MD as Consulting Physician (Internal Medicine)  CHIEF COMPLAINTS/PURPOSE OF CONSULTATION:  Oncology History  Colon cancer [facility])  01/15/2023 Initial Diagnosis   Colon cancer (HCC)   02/25/2023 Cancer Staging   Staging form: Colon and Rectum, AJCC 8th Edition - Clinical stage from 02/25/2023: Stage IIB (cT4a, cN0, cM0) - Signed by Loni Muse, MD on 02/25/2023 Histopathologic type: Adenocarcinoma, NOS Stage prefix: Initial diagnosis Total positive nodes: 0 Total nodes examined: 30 Histologic grade (G): G3 Histologic grading system: 4 grade system   03/11/2023 -  Chemotherapy   Patient is on Treatment Plan : COLORECTAL Xelox (Capeox)(130/850) q21d       HISTORY OF PRESENTING ILLNESS: [name] 67 y.o. female is here because of  colon cancer Medical history notable for septic arthritis of the hip, obesity, atypical chest pain, skin cancer, carpal tunnel syndrome, chronic vaginitis, chronic venous and sufficiency, TTP, syncope  January 10, 2023: Colonoscopy-ulcerated 2 cm nonobstructing small mass with heaped up margins found in distal transverse colon.  Mass was partially circumferential (involves less than one third of the lumen circumference) no bleeding present.  10 mm polyp in proximal ascending colon which was sessile.  Pathology-Ascending colon polyp was compatible with a sessile serrated adenoma without cytologic dysplasia Transverse colon polyp-invasive moderately differentiated adenocarcinoma arising within a tubular adenoma with high-grade dysplasia  January 15 2023: [facility] Medical Oncology Consult Patient reports that the colonoscopy was performed for surveillance; however, she was also having episodes of intermittent diarrhea x 2 to 3 months.  Stools were not bloody, nor was she passing mucus but they were soft.  In  association with these episodes she would also feel dizzy and note an odd smell, nausea.   To see Dr. Logan Bores from surgery on January 22 2023   Social:  Married.  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  Tobacco quit 24 years.  Rare glass of wine  [facility] Mother alive 71 epileptic, dementia Father died 56 Alzheimers, prostate cancer Brother alive 66 arthritis requiring joint replacement,   WBC 6.4 hemoglobin 12.5 MCV 88 platelet count 178 normal differential CMP normal CEA 2.9 chromogranin A 564  January 17 2023:  CT CAP Short segment asymmetric wall thickening of the transverse colon measuring 3.3 cm in length, compatible with reported history of  primary colonic neoplasm.  Small adjacent soft tissue nodule/lymph nodes adjacent to the colonic wall thickening measuring 6 mm, nonspecific are suspicious  for local nodal disease. Prominent/mildly enlarged right upper quadrant lymph nodes are similar dating back to January 26, 2019 and favored reactive.  No convincing evidence of distant metastatic disease within the chest, abdomen or pelvis.  Hepatomegaly with nodular hepatic contour, suggestive of cirrhosis Cholelithiasis without findings of acute cholecystitis.   Incidental right thyroid nodule measuring   January 18 2023:  Presented to [facility] ED with headaches and chest discomfort.  CT PA negative for PE.  CT head without contrast negative.    January 22 2023:  Scheduled follow up for colon cancer.  Reviewed results of labs and imaging with patient and husband. Will repeat chromgranin A and refer for PET/CT if chromogranin A still significantly elevated.    January 23, 2023: Chromogranin A 294  February 04 2023:  Transverse colectomy Pathology showed adenocarcinoma invading visceral peritoneum, Grade 3.  All of 30 lymph nodes were negative for tumor.  Surgical Stage (T4a, N0 M0)  MSI intact  Feb 25, 2023:   Reviewed results of surgical pathology with patient and husband.  Per NCCN guidelines adjuvant  options are CAPEOX, FOLFOX 6 months, Capecitabine or observation.  Discussed risks and benefits of each  Mar 07 2023:   Bifrontal headaches have returned.  These began in October 2023 but resolved after her colon surgery.  Has seen neurology and underwent MRI brain negative for CNS lesion.  EEG normal.   (Raises the question of what medications in perioperative period helped with the HA's.)  Recovering from a dog bite on dorsum of right hand.  Will receive Capecitabine today.    Mar 11 2023:  Port placement.  Cycle 1 XelOx   Mar 19 2023:  Neurology follow up.    Mar 20 2023:   Agree with Neurology assessment that neurocognitive testing should not be performed it at all, until well after chemotherapy has been completed.  Experienced some mild nausea helped by antiemetics.   Has occasional HA's.  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   No HFS.     April 01 2023:  Cycle 2 XelOx  April 17 2023:  Has lost 4 lbs.  Appetite diminished due to dysgeusia.  No metallic taste.  Edges of tongue get red and sore and painful.  Mouth feels dry despite drinking a lot of water and tea.  Not using anything for her mouth sores.   Fatigue increased.  Has cold neuropathy and cold induced pharyngospasm.      Begin Magic mouthwash for mucositis.     April 22 2023:  Cycle 3 XelOx.    April 30 2023:    Patient called office yesterday stating that she was anorectic, nauseated, having mouth sores and diarrhea.  Has lost 13 lbs.  Has difficulty remembering details which complicates history taking.  States that anorexia began after starting this round of chemotherapy.  "Food does not get past my tongue"; did not recognize this was nausea so did not begin taking antiemetics until 3 days ago.  Only taking antiemetics bid.  Mouth feels dry but not painful.  No emesis.  Began with diarrhea in the form of 5 to 6 stools per day but does not believe that she is taking imodium for it.  Dysgeusia  even to water.   Not using magic mouthwash regularly  May 02 2023:    Diarrhea improved with imodium.  Using ondansetron alternating with compazine; no emesis with improvement in nausea.  Eating limited by mucositis which she can't say is helped by magic mouthwash.  Not eating cold foods.  Has gained 2 lbs in the last two days.  To complete the course of Xeloda on May 06 2023.    WBC 4.7 hemoglobin 11.6 platelet count 61; 60 segs 23 lymphs 13 monos 4 eos CMP notable for potassium 3.4 glucose 103  May 08 2023:   Scheduled follow-up regarding colon cancer. Has lost 4 lbs since last visit owing to anorexia.   No mouth sores but has dysgeusia and mouth feels dry.  Using magic mouthwash.  Nauseated but without emesis.  Continues to have 5 to 6 bowel movements daily.  Using imodium 2 mg q 5 hrs.    Instructed patient to use imodium 4 mg po q 4 hrs PRN diarrhea Adding lomotil up to qid PRN Xeloda has not been shipped to patient.   Will dose reduce Xeloda from 2000 mg bid to  1500 mg bid.    May 13 2023:  Cycle 4 XelOX  Review of Systems  Constitutional:  Negative for appetite change, chills, fatigue, fever and unexpected weight change.       Has gained weight due to change in diet  HENT:   Negative for mouth sores, nosebleeds, sore throat, tinnitus and trouble swallowing.   Eyes:  Negative for eye problems and icterus.       Vision changes:  None  Respiratory:  Negative for chest tightness, hemoptysis, shortness of breath and wheezing.        One month of nonproductive cough.  No history of seasonable allergies  Cardiovascular:  Negative for chest pain and leg swelling.       Intermittent palpitations.  Chronic LE edema  Endocrine: Negative for hot flashes.       Cold intolerance:  none Heat intolerance:  none  Genitourinary:  Negative for bladder incontinence, difficulty urinating, dysuria, frequency, hematuria and nocturia.   Musculoskeletal:  Negative for arthralgias, back pain, gait  problem, myalgias, neck pain and neck stiffness.  Skin:  Negative for itching, rash and wound.  Neurological:  Negative for dizziness, extremity weakness, gait problem, headaches, numbness, seizures and speech difficulty.  Hematological:  Negative for adenopathy. Bruises/bleeds easily.  Psychiatric/Behavioral:  Negative for sleep disturbance and suicidal ideas. The patient is not nervous/anxious.     MEDICAL HISTORY: Past Medical History:  Diagnosis Date  . Abnormal glucose   . Achilles tendinitis of left lower extremity   . Acute suppurative arthritis due to bacteria (HCC) 03/09/2011   Overview:  Last Assessment & Plan:  Methicillin sensitive staphylococcal aureus septic hip. She clearly does need I&D of the hip. I will have her continue the doxycycline for now but stop it 7 days prior to her surgery to maximize the yield on the cultures in the operating room though I be shocked if we don't find methicillin sensitive staph aureus again.. I agree with removing as much of the pros  . Anal fissure 11/30/2015  . Arthritis    right hip  . Atypical chest pain 04/09/2016  . Bilateral edema of lower extremity   . BMI 39.0-39.9,adult   . Cancer [facility])    SKIN CANCER  . Carpal tunnel syndrome 03/09/2011   Overview:  Last Assessment & Plan:  Clinically this seems to be carpal tunnel syndrome. Giving given her history of infection though we can keep in the back of our mind the idea that she will have a cervical spine problem but I think this is unlikely at this point in time medics carpal tunnel syndrome fits clinically this was a positive Tinel's sign however in for a brace for her.  . Chronic vaginitis   . Chronic venous insufficiency   . Depression   . Dyslipidemia   . Fatigue 10/13/2015  . Fibromyalgia   . GERD without esophagitis   . History of immune thrombocytopenia 03/09/2011  . History of operative procedure on hip 03/10/2012  . History of TTP (thrombotic thrombocytopenic purpura)   .  Hypokalemia   . Lichen sclerosus et atrophicus of the vulva 11/30/2015  . Major depressive disorder, recurrent, moderate (HCC)   . Methicillin susceptible Staphylococcus aureus infection 03/09/2011   Overview:  Last Assessment & Plan:  This is undoubtedlythe culprit organism  . Migraine   . Morbid obesity with BMI of 45.0-49.9, adult (HCC) 10/13/2015  . Morbidly obese (HCC)   . MSSA (methicillin susceptible Staphylococcus aureus) infection   . Near  syncope   . Osteoarthritis, chronic   . Pain due to total hip replacement (HCC) 09/20/2011  . Palpitations 03/04/2019  . Postmenopausal   . 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 Snoring 11/22/2015  . T.T.P. syndrome (HCC)    20 yrs ago-not seen anyone for 10 yrs.  . Vitamin D deficiency   . 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    SURGICAL HISTORY: Past Surgical History:  Procedure Laterality Date  . COLONOSCOPY  03/01/2011   Mild melanosis coli. Mild diverticulosis. Small internal hemorrhoids. Healed anal fissure  . ENDOVENOUS ABLATION SAPHENOUS VEIN W/ LASER Right 04/02/2019   endovenous laser ablation right greater saphenous vein by Waverly Ferrari MD   . JOINT REPLACEMENT  08/2009   right hip replacement  . REVISION TOTAL HIP ARTHROPLASTY    . TONSILLECTOMY  1963  . TOTAL HIP REVISION  10/08/2011   Procedure: TOTAL HIP REVISION;  Surgeon: [name];  Location: [facility];  Service: Orthopedics;  Laterality: Right;  Resection of Right Total Hip/Extended Trochanteric Osteotomy/Placement of Antibiotic Total Hip Cemented by Depuy  . TOTAL HIP REVISION  03/10/2012   Procedure: TOTAL HIP REVISION;  Surgeon: [name], MD;  Location: [facility] ORS;  Service: Orthopedics;  Laterality: Right;  Reimplantation/Revision of a Right Total Hip and Removal of Cemented Implant    SOCIAL HISTORY: Social History   Socioeconomic History  . Marital status: Married    Spouse name: Raiford Noble   . Number of children: Not on file  . Years of education: 12+7  . Highest education level: Bachelor's degree (e.g., BA, AB, BS)  Occupational History  . Occupation: Magazine features editor: [employer]  Tobacco Use  . Smoking status: Former    Current packs/day: 0.00    Average packs/day: 1.5 packs/day for 20.0 years (30.0 ttl pk-yrs)    Types: Cigarettes    Start date: 10/04/1970    Quit date: 10/04/1990    Years since quitting: 32.6  . Smokeless tobacco: Former    Quit date: 03/09/1991  Vaping Use  . Vaping status: Never Used  Substance and Sexual Activity  . Alcohol use: Yes    Comment: OCCASIONAL  . Drug use: No  . Sexual activity: Yes    Partners: Male  Other Topics Concern  . Not on file  Social History Narrative  . Not on file   Social Determinants of Health   Financial Resource Strain: Not on file  Food Insecurity: Low Risk  (02/04/2023)   [facility]   Food vital sign   . Within the past 12 months, you worried that your food would run out before you got money to buy more: Never true   . Within the past 12 months, the food you bought just didn't last and you didn't have money to get more. : Never true  Transportation Needs: Not on file (02/04/2023)  Physical Activity: Not on file  Stress: Not on file  Social Connections: Not on file  Intimate Partner Violence: Not on file    FAMILY HISTORY Family History  Problem Relation Age of Onset  . Hypertension Mother   . Hypertension Father   . Alzheimer's disease Father   . Prostate cancer Father   . Hypertension Maternal Grandfather   . Seizures Other   . Colon cancer Neg Hx   . Stomach cancer Neg Hx   . Rectal cancer Neg Hx   .  Esophageal cancer Neg Hx     ALLERGIES:  has No Known Allergies.  MEDICATIONS:  Current Outpatient Medications  Medication Sig Dispense Refill  . aluminum-magnesium hydroxide 200-200 MG/5ML suspension Take 15 mLs by mouth every 6 (six) hours as  needed for indigestion.    . AMBULATORY NON FORMULARY MEDICATION Medication Name: Magic Mouth Wash 3 parts Mylanta/Maalox 2 parts Benadryl 1 part Viscous Lidocaine  Swish and Swallow 5 mL every 3-4 hours as needed 240 mL 0  . buPROPion (WELLBUTRIN XL) 150 MG 24 hr tablet Take 150 mg by mouth every evening.    . capecitabine (XELODA) 500 MG tablet Take 3 tablets (1,500 mg total) by mouth 2 (two) times daily after a meal. Take 14 days on, then 7 days off, repeat every 21 days. 180 tablet 3  . clobetasol cream (TEMOVATE) 0.05 % Apply 1 Application topically as needed.    . clotrimazole-betamethasone (LOTRISONE) cream Apply 1 Application topically 2 (two) times daily.    . cyclobenzaprine (FLEXERIL) 5 MG tablet Take 5-10 mg by mouth at bedtime.    . diphenoxylate-atropine (LOMOTIL) 2.5-0.025 MG tablet Take 1 tablet by mouth 4 (four) times daily as needed for diarrhea or loose stools. 120 tablet 1  . divalproex (DEPAKOTE) 250 MG DR tablet Take 250 mg by mouth 2 (two) times daily.    . DULoxetine (CYMBALTA) 60 MG capsule Take 60 mg by mouth at bedtime.     [name] Kitchen esomeprazole (NEXIUM) 40 MG capsule Take 40 mg by mouth daily. Pt states has been on for years    . ibuprofen (ADVIL) 200 MG tablet Take 200 mg by mouth every 6 (six) hours as needed.    [name] Kitchen levofloxacin (LEVAQUIN) 500 MG tablet Take 1 tablet (500 mg total) by mouth daily for 7 days. 7 tablet 0  . magic mouthwash (lidocaine, diphenhydrAMINE, alum & mag hydroxide) suspension Swish and swallow 5 mLs every 4 (four) hours as needed for mouth pain. 240 mL 0  . magic mouthwash (lidocaine, diphenhydrAMINE, alum & mag hydroxide) suspension Swish and swallow 5 mLs every 4 (four) hours as needed for mouth pain. 240 mL 0  . meloxicam (MOBIC) 7.5 MG tablet Take 7.5 mg by mouth every morning.    . Multiple Vitamin (MULTIVITAMIN WITH MINERALS) TABS tablet Take 1 tablet by mouth daily.    . nortriptyline (PAMELOR) 10 MG capsule Take 10 mg by mouth.    .  ondansetron (ZOFRAN) 8 MG tablet Take 1 tablet (8 mg total) by mouth 4 (four) times daily as needed for nausea or vomiting. 120 tablet 0  . oxyCODONE (OXY IR/ROXICODONE) 5 MG immediate release tablet SMARTSIG:5 Milligram(s) By Mouth Every 4 Hours PRN    . potassium chloride SA (KLOR-CON M) 20 MEQ tablet Take 3 tablet today, then start 1 tablet twice daily 64 tablet 0  . predniSONE (DELTASONE) 20 MG tablet Take 20 mg by mouth 2 (two) times daily.    . prochlorperazine (COMPAZINE) 10 MG tablet Take 1 tablet (10 mg total) by mouth every 6 (six) hours as needed for nausea or vomiting. 30 tablet 1  . SUMAtriptan (IMITREX) 25 MG tablet Take 25 mg by mouth.     Current Facility-Administered Medications  Medication Dose Route Frequency Provider Last Rate Last Admin  . triamcinolone acetonide (KENALOG-40) injection 20 mg  20 mg Other Once Asencion Islam, DPM        PHYSICAL EXAMINATION:  ECOG PERFORMANCE STATUS: 1 - Symptomatic but completely ambulatory   There were  no vitals filed for this visit.    There were no vitals filed for this visit.     Physical Exam Vitals and nursing note reviewed.  Constitutional:      General: She is not in acute distress.    Appearance: Normal appearance. She is obese. She is not ill-appearing, toxic-appearing or diaphoretic.     Comments: Here alone.    HENT:     Head: Normocephalic and atraumatic.     Right Ear: External ear normal.     Left Ear: External ear normal.     Nose: Nose normal. No congestion or rhinorrhea.  Eyes:     General: No scleral icterus.    Extraocular Movements: Extraocular movements intact.     Conjunctiva/sclera: Conjunctivae normal.     Pupils: Pupils are equal, round, and reactive to light.  Cardiovascular:     Rate and Rhythm: Normal rate.     Heart sounds: No murmur heard.    No friction rub. No gallop.  Abdominal:     General: Bowel sounds are normal.     Palpations: Abdomen is soft.     Tenderness: There is no  abdominal tenderness. There is no guarding or rebound.  Musculoskeletal:        General: No swelling, tenderness or deformity.     Cervical back: Normal range of motion and neck supple. No rigidity or tenderness.  Lymphadenopathy:     Head:     Right side of head: No submental, submandibular, tonsillar, preauricular, posterior auricular or occipital adenopathy.     Left side of head: No submental, submandibular, tonsillar, preauricular, posterior auricular or occipital adenopathy.     Cervical: No cervical adenopathy.     Right cervical: No superficial, deep or posterior cervical adenopathy.    Left cervical: No superficial, deep or posterior cervical adenopathy.     Upper Body:     Right upper body: No supraclavicular, axillary, pectoral or epitrochlear adenopathy.     Left upper body: No supraclavicular, axillary, pectoral or epitrochlear adenopathy.  Skin:    General: Skin is warm.     Coloration: Skin is not jaundiced.  Neurological:     General: No focal deficit present.     Mental Status: She is alert and oriented to person, place, and time. Mental status is at baseline.     Cranial Nerves: No cranial nerve deficit.  Psychiatric:        Mood and Affect: Mood normal.        Behavior: Behavior normal.        Thought Content: Thought content normal.        Judgment: Judgment normal.     LABORATORY DATA: I have personally reviewed the data as listed:  Abstract on 05/21/2023  Component Date Value Ref Range Status  . Glucose 05/21/2023 96   Final  . BUN 05/21/2023 15  4 - 21 Final  . CO2 05/21/2023 27 (A)  13 - 22 Final  . Creatinine 05/21/2023 0.9  0.5 - 1.1 Final  . Potassium 05/21/2023 3.4 (A)  3.5 - 5.1 mEq/L Final  . Sodium 05/21/2023 135 (A)  137 - 147 Final  . Chloride 05/21/2023 100  99 - 108 Final  . Calcium 05/21/2023 9.5  8.7 - 10.7 Final  . Albumin 05/21/2023 3.8  3.5 - 5.0 Final  . Alkaline Phosphatase 05/21/2023 76  25 - 125 Final  . ALT 05/21/2023 17  7 - 35  U/L Final  . AST 05/21/2023  38 (A)  13 - 35 Final  . Bilirubin, Total 05/21/2023 1.1   Final  . Magnesium 05/21/2023 1.70   Final  Abstract on 05/21/2023  Component Date Value Ref Range Status  . Hemoglobin 05/21/2023 10.4 (A)  12.0 - 16.0 Final  . HCT 05/21/2023 30 (A)  36 - 46 Final  . Neutrophils Absolute 05/21/2023 1.75   Final  . Platelets 05/21/2023 50 (A)  150 - 400 K/uL Final  . WBC 05/21/2023 3.8   Final  . RBC 05/21/2023 3.28 (A)  3.87 - 5.11 Final  Appointment on 05/21/2023  Component Date Value Ref Range Status  . CBC with Differential 05/21/2023 Sent to [facility] for testing. See scanned report.   Final   Performed at [facility], [address] [place], [place] [address]  . Comprehensive Metabolic Panel 05/21/2023 Sent to [facility] for testing. See scanned report.   Final   Performed at [facility], [address], [place], [place] [address]  Abstract on 05/13/2023  Component Date Value Ref Range Status  . Hemoglobin 05/13/2023 10.8 (A)  12.0 - 16.0 Final  . HCT 05/13/2023 31 (A)  36 - 46 Final  . Neutrophils Absolute 05/13/2023 2.84   Final  . Platelets 05/13/2023 91 (A)  150 - 400 K/uL Final  . WBC 05/13/2023 3.5   Final  . RBC 05/13/2023 3.43 (A)  3.87 - 5.11 Final  . Glucose 05/13/2023 148   Final  . BUN 05/13/2023 9  4 - 21 Final  . CO2 05/13/2023 29 (A)  13 - 22 Final  . Creatinine 05/13/2023 0.6  0.5 - 1.1 Final  . Potassium 05/13/2023 3.3 (A)  3.5 - 5.1 mEq/L Final  . Sodium 05/13/2023 137  137 - 147 Final  . Chloride 05/13/2023 102  99 - 108 Final  . Calcium 05/13/2023 9.1  8.7 - 10.7 Final  . Albumin 05/13/2023 3.6  3.5 - 5.0 Final  . Alkaline Phosphatase 05/13/2023 82  25 - 125 Final  . ALT 05/13/2023 18  7 - 35 U/L Final  . AST 05/13/2023 38 (A)  13 - 35 Final  . Bilirubin, Total 05/13/2023 1.1   Final  . Magnesium 05/13/2023 1.90   Final  Appointment on 05/09/2023  Component Date Value Ref Range  Status  . Potassium 05/09/2023 3.0 (L)  3.5 - 5.1 mmol/L Final   Performed at [facility], [address] [place], [place] [address]  . Magnesium 05/09/2023 1.7  1.7 - 2.4 mg/dL Final   Performed at [facility], [address], [place], [place] [address]  Appointment on 05/08/2023  Component Date Value Ref Range Status  . Magnesium 05/08/2023 1.9  1.7 - 2.4 mg/dL Final   Performed at [facility], [address] [place], [place] [address]  Office Visit on 05/08/2023  Component Date Value Ref Range Status  . WBC 05/08/2023 3.3 (L)  4.0 - 10.5 K/uL Final  . RBC 05/08/2023 3.26 (L)  3.87 - 5.11 MIL/uL Final  . Hemoglobin 05/08/2023 10.2 (L)  12.0 - 15.0 g/dL Final  . HCT 44/12/4740 29.7 (L)  36.0 - 46.0 % Final  . MCV 05/08/2023 91.1  80.0 - 100.0 fL Final  . MCH 05/08/2023 31.3  26.0 - 34.0 pg Final  . MCHC 05/08/2023 34.3  30.0 - 36.0 g/dL Final  . RDW 59/56/3875 17.4 (H)  11.5 - 15.5 % Final  . Platelets 05/08/2023 89 (  L)  150 - 400 K/uL Final   Comment: SPECIMEN CHECKED FOR CLOTS Immature Platelet Fraction may be clinically indicated, consider ordering this additional test [id number] REPEATED TO VERIFY PLATELET COUNT CONFIRMED BY SMEAR   . nRBC 05/08/2023 0.0  0.0 - 0.2 % Final  . Neutrophils Relative % 05/08/2023 52  % Final  . Neutro Abs 05/08/2023 1.7  1.7 - 7.7 K/uL Final  . Lymphocytes Relative 05/08/2023 21  % Final  . Lymphs Abs 05/08/2023 0.7  0.7 - 4.0 K/uL Final  . Monocytes Relative 05/08/2023 23  % Final  . Monocytes Absolute 05/08/2023 0.8  0.1 - 1.0 K/uL Final  . Eosinophils Relative 05/08/2023 4  % Final  . Eosinophils Absolute 05/08/2023 0.1  0.0 - 0.5 K/uL Final  . Basophils Relative 05/08/2023 0  % Final  . Basophils Absolute 05/08/2023 0.0  0.0 - 0.1 K/uL Final  . Immature Granulocytes 05/08/2023 0  % Final  . Abs Immature Granulocytes 05/08/2023 0.01  0.00 - 0.07 K/uL Final   Performed at [facility], [address] [place], [place] [address]  . Sodium 05/08/2023 138  135 - 145 mmol/L Final  . Potassium 05/08/2023 2.5 (LL)  3.5 - 5.1 mmol/L Final   Comment: CRITICAL RESULT CALLED TO, READ BACK BY AND VERIFIED WITH DUNLAP, K RN @ 1432 ON 05/08/2023 BY ABDULHALIM,M   . Chloride 05/08/2023 102  98 - 111 mmol/L Final  . CO2 05/08/2023 26  22 - 32 mmol/L Final  . Glucose, Bld 05/08/2023 100 (H)  70 - 99 mg/dL Final   Glucose reference range applies only to samples taken after fasting for at least 8 hours.  . BUN 05/08/2023 12  8 - 23 mg/dL Final  . Creatinine, Ser 05/08/2023 0.86  0.44 - 1.00 mg/dL Final  . Calcium 56/21/3086 8.8 (L)  8.9 - 10.3 mg/dL Final  . Total Protein 05/08/2023 6.1 (L)  6.5 - 8.1 g/dL Final  . Albumin 57/84/6962 3.8  3.5 - 5.0 g/dL Final  . AST 95/28/4132 29  15 - 41 U/L Final  . ALT 05/08/2023 20  0 - 44 U/L Final  . Alkaline Phosphatase 05/08/2023 62  38 - 126 U/L Final  . Total Bilirubin 05/08/2023 1.3 (H)  0.3 - 1.2 mg/dL Final  . GFR, Estimated 05/08/2023 >60  >60 mL/min Final   Comment: (NOTE) Calculated using the CKD-EPI Creatinine Equation (2021)   . Anion gap 05/08/2023 10  5 - 15 Final   Performed at [facility], [address] [place], [place] [address]  Appointment on 04/30/2023  Component Date Value Ref Range Status  . Sodium 04/30/2023 138  135 - 145 mmol/L Final  . Potassium 04/30/2023 3.4 (L)  3.5 - 5.1 mmol/L Final  . Chloride 04/30/2023 104  98 - 111 mmol/L Final  . CO2 04/30/2023 23  22 - 32 mmol/L Final  . Glucose, Bld 04/30/2023 103 (H)  70 - 99 mg/dL Final   Glucose reference range applies only to samples taken after fasting for at least 8 hours.  . BUN 04/30/2023 19  8 - 23 mg/dL Final  . Creatinine, Ser 04/30/2023 0.92  0.44 - 1.00 mg/dL Final  . Calcium 27/25/3664 9.1  8.9 - 10.3 mg/dL Final  . Total Protein 04/30/2023 6.9  6.5 - 8.1 g/dL Final  . Albumin 40/34/7425 4.1  3.5 - 5.0 g/dL  Final  . AST 95/63/8756 32  15 - 41 U/L Final  .  ALT 04/30/2023 21  0 - 44 U/L Final  . Alkaline Phosphatase 04/30/2023 63  38 - 126 U/L Final  . Total Bilirubin 04/30/2023 1.1  0.3 - 1.2 mg/dL Final  . GFR, Estimated 04/30/2023 >60  >60 mL/min Final   Comment: (NOTE) Calculated using the CKD-EPI Creatinine Equation (2021)   . Anion gap 04/30/2023 11  5 - 15 Final   Performed at [facility], [address], [place], [place] [address]  . WBC 04/30/2023 4.7  4.0 - 10.5 K/uL Final  . RBC 04/30/2023 3.79 (L)  3.87 - 5.11 MIL/uL Final  . Hemoglobin 04/30/2023 11.6 (L)  12.0 - 15.0 g/dL Final  . HCT 57/84/6962 34.1 (L)  36.0 - 46.0 % Final  . MCV 04/30/2023 90.0  80.0 - 100.0 fL Final  . MCH 04/30/2023 30.6  26.0 - 34.0 pg Final  . MCHC 04/30/2023 34.0  30.0 - 36.0 g/dL Final  . RDW 95/28/4132 16.8 (H)  11.5 - 15.5 % Final  . Platelets 04/30/2023 61 (L)  150 - 400 K/uL Final   Comment: Immature Platelet Fraction may be clinically indicated, consider ordering this additional test [id number] REPEATED TO VERIFY PLATELET COUNT CONFIRMED BY SMEAR   . nRBC 04/30/2023 0.0  0.0 - 0.2 % Final  . Neutrophils Relative % 04/30/2023 60  % Final  . Neutro Abs 04/30/2023 2.8  1.7 - 7.7 K/uL Final  . Lymphocytes Relative 04/30/2023 23  % Final  . Lymphs Abs 04/30/2023 1.1  0.7 - 4.0 K/uL Final  . Monocytes Relative 04/30/2023 13  % Final  . Monocytes Absolute 04/30/2023 0.6  0.1 - 1.0 K/uL Final  . Eosinophils Relative 04/30/2023 4  % Final  . Eosinophils Absolute 04/30/2023 0.2  0.0 - 0.5 K/uL Final  . Basophils Relative 04/30/2023 0  % Final  . Basophils Absolute 04/30/2023 0.0  0.0 - 0.1 K/uL Final  . Immature Granulocytes 04/30/2023 0  % Final  . Abs Immature Granulocytes 04/30/2023 0.02  0.00 - 0.07 K/uL Final   Performed at [facility], [address], [place], [place] [address]  . Magnesium 04/30/2023 1.9  1.7 - 2.4 mg/dL Final   Performed at [facility], [address] [place], [place] [address]    RADIOGRAPHIC STUDIES: I have personally reviewed the radiological images as listed and agree with the findings in the report  No results found.  ASSESSMENT/PLAN  67 y.o. female is here because of  colon cancer.  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 Mass was partially circumferential (involves < 1/3rd of the lumen circumference). 10 mm polyp in proximal ascending colon which was sessile.  Pathology ulcerated mass- invasive moderately differentiated adenocarcinoma arising within a tubular adenoma with high-grade dysplasia   January 15 2023- CEA 2.9 Chromogranin A 564  January 17 2023- CT CAP  Short segment asymmetric wall thickening of the transverse colon measuring 3.3 cm in length.   Small adjacent soft tissue nodule/lymph nodes adjacent to the colonic wall thickening measuring 6 mm, suspicious  for local nodal disease. Prominent/mildly enlarged right upper quadrant lymph nodes similar to January 26, 2019 and favored  reactive.  No distant metastatic disease.  Hepatomegaly with nodular hepatic contour, suggestive of cirrhosis Cholelithiasis without findings of acute cholecystitis.   Incidental right thyroid nodule   January 23 2023- Chromogranin A 294  February 04 2023:  Transverse colectomy.  Pathology showed adenocarcinoma invading visceral peritoneum, Grade 3.  All of 30 lymph nodes negative for tumor.  MSI intact Feb 25 2023- On basis of spontaneous improvement in chromogranin A will hold on Dodatate PET.     Therapeutics-  Feb 25 2023- Reviewed [facility]  treatment guidelines with patient and husband.  They are agreeable to proceed with adjuvant CAPOX x 6 months.   Mar 11 2023- Cycle 1 XelOx Mar 20 2023- Tolerated cycle 1 well with only some mild cold neuropathy April 01 2023- Cycle 2 XelOx   April 22 2023: Cycle 3 XelOx.              April 30 2023- Experiencing mucositis, anorexia, nausea and diarrhea.    May 13 2023: Cycle 4 XelOX Will dose reduce Xeloda from 2000 mg bid to 1500 mg bid due to GI symptoms.  This will be her last cycle of adjuvant therapy   Chemotherapy induced nausea and diarrhea             April 30 2023-  Instructed patient to do the following-- Take Ondansetron 8 mg, four times daily.  Take Imodium 4 mg every 4 hrs for diarrhea Use the magic mouthwash every 4 hours while awake.  Arranged for patient to receive IVF and antiemetics.  To follow up in 2 days to assess progress     May 02 2023- To complete this course of Xeloda on July 15th.  Should improve with the week off between then and beginning of Cycle 4.  Anticipate dose reduction in Xeloda with Cycle 4.  Sx under better control today than at last visit due to better use of supportive care May 08 2023-  Continues to have 5 to 6 bowel movements daily.  Using imodium 2 mg q 5 hrs.  Instructed patient to use imodium 4 mg po q 4 hrs PRN diarrhea Adding lomotil up to qid PRN.  Has required potassium replacement due to diarrheal losses     Diarrhea:             January 15 2023- Not explained by the colon cancer.  No evidence of inflammatory bowel disease by colonoscopy.    Chromogranin A 564  January 23 2023- Repeat Chromogranin A improved  Feb 25 2023- Diarrhea and chromogranin A improved therefore holding on Dotatate PET/CT    Poor venous access:    Mar 11 2023- Port placement   Headaches Mar 07 2023- Improved following surgery likely related to a component of general anesthesia.  To see neurology  Possible cirrhosis  January 17 2023- Nodular liver contour noted on CT CAP     Cancer Staging  Colon cancer [facility]) Staging form: Colon and Rectum, AJCC 8th Edition - Clinical stage from 02/25/2023: Stage IIB (cT4a, cN0, cM0) - Signed by Loni Muse, MD on 02/25/2023 Histopathologic type: Adenocarcinoma, NOS Stage prefix: Initial diagnosis Total positive nodes: 0 Total nodes examined: 30 Histologic grade (G): G3 Histologic grading system: 4 grade system    No problem-specific Assessment & Plan notes found for this encounter.    No orders of the defined types were placed in this encounter.   40  minutes was spent in patient care.  This included time spent preparing to  see the patient (e.g., review of tests), obtaining and/or reviewing separately obtained history, counseling and educating the patient/family/caregiver, ordering medications, tests, or procedures; documenting clinical information in the electronic or other health record, independently interpreting results and communicating results to the patient/family/caregiver as well as coordination of care.       All questions were answered. The patient knows to call the clinic with any problems, questions or concerns.  This note was electronically signed.    Loni Muse, MD  05/23/2023 10:27 AM

## 2023-05-23 NOTE — Progress Notes (Signed)
[facility] Cancer Initial Visit:  Patient Care Team: Hurshel Party, NP as PCP - General (Internal Medicine) Loni Muse, MD as Consulting Physician (Internal Medicine)  CHIEF COMPLAINTS/PURPOSE OF CONSULTATION:  Oncology History  Colon cancer [facility])  01/15/2023 Initial Diagnosis   Colon cancer (HCC)   02/25/2023 Cancer Staging   Staging form: Colon and Rectum, AJCC 8th Edition - Clinical stage from 02/25/2023: Stage IIB (cT4a, cN0, cM0) - Signed by Loni Muse, MD on 02/25/2023 Histopathologic type: Adenocarcinoma, NOS Stage prefix: Initial diagnosis Total positive nodes: 0 Total nodes examined: 30 Histologic grade (G): G3 Histologic grading system: 4 grade system   03/11/2023 -  Chemotherapy   Patient is on Treatment Plan : COLORECTAL Xelox (Capeox)(130/850) q21d       HISTORY OF PRESENTING ILLNESS: [name] 67 y.o. female is here because of  colon cancer Medical history notable for septic arthritis of the hip, obesity, atypical chest pain, skin cancer, carpal tunnel syndrome, chronic vaginitis, chronic venous and sufficiency, TTP, syncope  January 10, 2023: Colonoscopy-ulcerated 2 cm nonobstructing small mass with heaped up margins found in distal transverse colon.  Mass was partially circumferential (involves less than one third of the lumen circumference) no bleeding present.  10 mm polyp in proximal ascending colon which was sessile.  Pathology-Ascending colon polyp was compatible with a sessile serrated adenoma without cytologic dysplasia Transverse colon polyp-invasive moderately differentiated adenocarcinoma arising within a tubular adenoma with high-grade dysplasia  January 15 2023: [facility] Medical Oncology Consult Patient reports that the colonoscopy was performed for surveillance; however, she was also having episodes of intermittent diarrhea x 2 to 3 months.  Stools were not bloody, nor was she passing mucus but they were soft.  In  association with these episodes she would also feel dizzy and note an odd smell, nausea.   To see Dr. Logan Bores from surgery on January 22 2023   Social:  Married.  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  Tobacco quit 24 years.  Rare glass of wine  [facility] Mother alive 29 epileptic, dementia Father died 58 Alzheimers, prostate cancer Brother alive 45 arthritis requiring joint replacement,   WBC 6.4 hemoglobin 12.5 MCV 88 platelet count 178 normal differential CMP normal CEA 2.9 chromogranin A 564  January 17 2023:  CT CAP Short segment asymmetric wall thickening of the transverse colon measuring 3.3 cm in length, compatible with reported history of  primary colonic neoplasm.  Small adjacent soft tissue nodule/lymph nodes adjacent to the colonic wall thickening measuring 6 mm, nonspecific are suspicious  for local nodal disease. Prominent/mildly enlarged right upper quadrant lymph nodes are similar dating back to January 26, 2019 and favored reactive.  No convincing evidence of distant metastatic disease within the chest, abdomen or pelvis.  Hepatomegaly with nodular hepatic contour, suggestive of cirrhosis Cholelithiasis without findings of acute cholecystitis.   Incidental right thyroid nodule measuring   January 18 2023:  Presented to [facility] ED with headaches and chest discomfort.  CT PA negative for PE.  CT head without contrast negative.    January 22 2023:  Scheduled follow up for colon cancer.  Reviewed results of labs and imaging with patient and husband. Will repeat chromgranin A and refer for PET/CT if chromogranin A still significantly elevated.    January 23, 2023: Chromogranin A 294  February 04 2023:  Transverse colectomy Pathology showed adenocarcinoma invading visceral peritoneum, Grade 3.  All of 30 lymph nodes were negative for tumor.  Surgical Stage (T4a, N0 M0)  MSI intact  Feb 25, 2023:   Reviewed results of surgical pathology with patient and husband.  Per NCCN guidelines adjuvant  options are CAPEOX, FOLFOX 6 months, Capecitabine or observation.  Discussed risks and benefits of each  Mar 07 2023:   Bifrontal headaches have returned.  These began in October 2023 but resolved after her colon surgery.  Has seen neurology and underwent MRI brain negative for CNS lesion.  EEG normal.   (Raises the question of what medications in perioperative period helped with the HA's.)  Recovering from a dog bite on dorsum of right hand.  Will receive Capecitabine today.    Mar 11 2023:  Port placement.  Cycle 1 XelOx   Mar 19 2023:  Neurology follow up.    Mar 20 2023:   Agree with Neurology assessment that neurocognitive testing should not be performed it at all, until well after chemotherapy has been completed.  Experienced some mild nausea helped by antiemetics.   Has occasional HA's.  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   No HFS.     April 01 2023:  Cycle 2 XelOx  April 17 2023:  Has lost 4 lbs.  Appetite diminished due to dysgeusia.  No metallic taste.  Edges of tongue get red and sore and painful.  Mouth feels dry despite drinking a lot of water and tea.  Not using anything for her mouth sores.   Fatigue increased.  Has cold neuropathy and cold induced pharyngospasm.      Begin Magic mouthwash for mucositis.     April 22 2023:  Cycle 3 XelOx.    April 30 2023:    Patient called office yesterday stating that she was anorectic, nauseated, having mouth sores and diarrhea.  Has lost 13 lbs.  Has difficulty remembering details which complicates history taking.  States that anorexia began after starting this round of chemotherapy.  "Food does not get past my tongue"; did not recognize this was nausea so did not begin taking antiemetics until 3 days ago.  Only taking antiemetics bid.  Mouth feels dry but not painful.  No emesis.  Began with diarrhea in the form of 5 to 6 stools per day but does not believe that she is taking imodium for it.  Dysgeusia  even to water.   Not using magic mouthwash regularly  May 02 2023:    Diarrhea improved with imodium.  Using ondansetron alternating with compazine; no emesis with improvement in nausea.  Eating limited by mucositis which she can't say is helped by magic mouthwash.  Not eating cold foods.  Has gained 2 lbs in the last two days.  To complete the course of Xeloda on May 06 2023.    WBC 4.7 hemoglobin 11.6 platelet count 61; 60 segs 23 lymphs 13 monos 4 eos CMP notable for potassium 3.4 glucose 103  May 08 2023:   Scheduled follow-up regarding colon cancer. Has lost 4 lbs since last visit owing to anorexia.   No mouth sores but has dysgeusia and mouth feels dry.  Using magic mouthwash.  Nauseated but without emesis.  Continues to have 5 to 6 bowel movements daily.  Using imodium 2 mg q 5 hrs.    Instructed patient to use imodium 4 mg po q 4 hrs PRN diarrhea Adding lomotil up to qid PRN Xeloda has not been shipped to patient.   Will dose reduce Xeloda from 2000 mg bid to  1500 mg bid.    May 13 2023:  Cycle 4 XelOX.  Received total of 1 liter IVF during that visit due to hypotension, potassium due to hypokalemia.  Patient was told to increase dose of supplemental potassium  May 21 2023:  Seen as a work in due to side effects from chemotherapy.  Has lost 11 lbs since last visit.  Has been experiencing liquid stools sometimes almost hourly.  No nausea, emesis, mouth sores. Abdominal cramping. No fevers.  Appetite decreased.   Taking imodium 2 mg bid.  Not certain if she is taking lomotil.   Received IVF, IV potassium in infusion center WBC 3.8 hemoglobin 10.4 platelet count 50 CMP notable for potassium 3.4 AST 38 albumin 3.8 magnesium 1.7 DPD 5-fluorouracil toxicity panel sent  May 22 2023:  Fell backwards on steps outside.  Struck right elbow and knee but not head.  Skin tear to right elbow.   May 23, 2023: Scheduled follow-up to monitor side effects from chemotherapy.  Anorectic but eating  some.  Has gained 1 lbs.  Today experienced flash HA and accompanied by chest pain which last 2 to 4 minutes.  Yesterday had three diarrheal stools.  Significant improvement since taking Ondansetron, Lomotil and Imodium on scheduled basis.  Spending most of time sleeping.  Lost balance twice today.  Will stop Xeloda and arrange for hospitalization.    Review of Systems  Constitutional:  Positive for appetite change and unexpected weight change. Negative for chills, fatigue and fever.  HENT:   Negative for mouth sores, nosebleeds, sore throat, tinnitus and trouble swallowing.   Eyes:  Negative for eye problems and icterus.       Vision changes:  None  Respiratory:  Negative for chest tightness, hemoptysis, shortness of breath and wheezing.        One month of nonproductive cough.  No history of seasonable allergies  Cardiovascular:  Negative for chest pain and leg swelling.  Gastrointestinal:  Positive for diarrhea and nausea. Negative for constipation, rectal pain and vomiting.  Endocrine: Negative for hot flashes.       Cold intolerance:  none Heat intolerance:  none  Genitourinary:  Negative for bladder incontinence, difficulty urinating, dysuria, frequency, hematuria and nocturia.   Musculoskeletal:  Positive for gait problem. Negative for arthralgias, back pain, myalgias, neck pain and neck stiffness.  Skin:  Negative for itching, rash and wound.  Neurological:  Positive for dizziness, extremity weakness, gait problem and headaches. Negative for numbness, seizures and speech difficulty.  Hematological:  Negative for adenopathy. Bruises/bleeds easily.  Psychiatric/Behavioral:  Positive for confusion. Negative for sleep disturbance and suicidal ideas. The patient is not nervous/anxious.     MEDICAL HISTORY: Past Medical History:  Diagnosis Date   Abnormal glucose    Achilles tendinitis of left lower extremity    Acute suppurative arthritis due to bacteria (HCC) 03/09/2011   Overview:   Last Assessment & Plan:  Methicillin sensitive staphylococcal aureus septic hip. She clearly does need I&D of the hip. I will have her continue the doxycycline for now but stop it 7 days prior to her surgery to maximize the yield on the cultures in the operating room though I be shocked if we don't find methicillin sensitive staph aureus again.. I agree with removing as much of the pros   Anal fissure 11/30/2015   Arthritis    right hip   Atypical chest pain 04/09/2016   Bilateral edema of lower extremity    BMI 39.0-39.9,adult  Cancer [facility])    SKIN CANCER   Carpal tunnel syndrome 03/09/2011   Overview:  Last Assessment & Plan:  Clinically this seems to be carpal tunnel syndrome. Giving given her history of infection though we can keep in the back of our mind the idea that she will have a cervical spine problem but I think this is unlikely at this point in time medics carpal tunnel syndrome fits clinically this was a positive Tinel's sign however in for a brace for her.   Chronic vaginitis    Chronic venous insufficiency    Depression    Dyslipidemia    Fatigue 10/13/2015   Fibromyalgia    GERD without esophagitis    History of immune thrombocytopenia 03/09/2011   History of operative procedure on hip 03/10/2012   History of TTP (thrombotic thrombocytopenic purpura)    Hypokalemia    Lichen sclerosus et atrophicus of the vulva 11/30/2015   Major depressive disorder, recurrent, moderate (HCC)    Methicillin susceptible Staphylococcus aureus infection 03/09/2011   Overview:  Last Assessment & Plan:  This is undoubtedlythe culprit organism   Migraine    Morbid obesity with BMI of 45.0-49.9, adult (HCC) 10/13/2015   Morbidly obese (HCC)    MSSA (methicillin susceptible Staphylococcus aureus) infection    Near syncope    Osteoarthritis, chronic    Pain due to total hip replacement (HCC) 09/20/2011   Palpitations 03/04/2019   Postmenopausal    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 syndrome (HCC)    20 yrs ago-not seen anyone for 10 yrs.   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 Mild diverticulosis. Small internal hemorrhoids. Healed anal fissure   ENDOVENOUS ABLATION SAPHENOUS VEIN W/ LASER Right 04/02/2019   endovenous laser ablation right greater saphenous vein by Waverly Ferrari MD    JOINT REPLACEMENT  08/2009   right hip replacement   REVISION TOTAL HIP ARTHROPLASTY     TONSILLECTOMY  1963   TOTAL HIP REVISION  10/08/2011   Procedure: TOTAL HIP REVISION;  Surgeon: [name];  Location: [facility];  Service: Orthopedics;  Laterality: Right;  Resection of Right Total Hip/Extended Trochanteric Osteotomy/Placement of Antibiotic Total Hip Cemented by Depuy   TOTAL HIP REVISION  03/10/2012   Procedure: TOTAL HIP REVISION;  Surgeon: [name], MD;  Location: [facility];  Service: Orthopedics;  Laterality: Right;  Reimplantation/Revision of a Right Total Hip and Removal of Cemented Implant    SOCIAL HISTORY: Social History   Socioeconomic History   Marital status: Married    Spouse name: Raiford Noble   Number of children: Not on file   Years of education: 12+7   Highest education level: Bachelor's degree (e.g., BA, AB, BS)  Occupational History   Occupation: Magazine features editor: [employer]  Tobacco Use   Smoking status: Former    Current packs/day: 0.00    Average packs/day: 1.5 packs/day for 20.0 years (30.0 ttl pk-yrs)    Types: Cigarettes    Start date: 10/04/1970    Quit date: 10/04/1990    Years since quitting: 32.6   Smokeless tobacco: Former    Quit date: 03/09/1991  Vaping Use   Vaping status: Never Used  Substance and Sexual Activity   Alcohol use: Yes  Comment: OCCASIONAL   Drug use: No   Sexual activity:  Yes    Partners: Male  Other Topics Concern   Not on file  Social History Narrative   Not on file   Social Determinants of Health   Financial Resource Strain: Not on file  Food Insecurity: Low Risk  (02/04/2023)   [facility]   Food vital sign    Within the past 12 months, you worried that your food would run out before you got money to buy more: Never true    Within the past 12 months, the food you bought just didn't last and you didn't have money to get more. : Never true  Transportation Needs: Not on file (02/04/2023)  Physical Activity: Not on file  Stress: Not on file  Social Connections: Not on file  Intimate Partner Violence: Not on file    FAMILY HISTORY Family History  Problem Relation Age of Onset   Hypertension Mother    Hypertension Father    Alzheimer's disease Father    Prostate cancer Father    Hypertension Maternal Grandfather    Seizures Other    Colon cancer Neg Hx    Stomach cancer Neg Hx    Rectal cancer Neg Hx    Esophageal cancer Neg Hx     ALLERGIES:  has No Known Allergies.  MEDICATIONS:  Current Outpatient Medications  Medication Sig Dispense Refill   aluminum-magnesium hydroxide 200-200 MG/5ML suspension Take 15 mLs by mouth every 6 (six) hours as needed for indigestion.     AMBULATORY NON FORMULARY MEDICATION Medication Name: Magic Mouth Wash 3 parts Mylanta/Maalox 2 parts Benadryl 1 part Viscous Lidocaine  Swish and Swallow 5 mL every 3-4 hours as needed 240 mL 0   buPROPion (WELLBUTRIN XL) 150 MG 24 hr tablet Take 150 mg by mouth every evening.     capecitabine (XELODA) 500 MG tablet Take 3 tablets (1,500 mg total) by mouth 2 (two) times daily after a meal. Take 14 days on, then 7 days off, repeat every 21 days. 180 tablet 3   clobetasol cream (TEMOVATE) 0.05 % Apply 1 Application topically as needed.     clotrimazole-betamethasone (LOTRISONE) cream Apply 1 Application topically 2 (two) times daily.      cyclobenzaprine (FLEXERIL) 5 MG tablet Take 5-10 mg by mouth at bedtime.     diphenoxylate-atropine (LOMOTIL) 2.5-0.025 MG tablet Take 1 tablet by mouth 4 (four) times daily as needed for diarrhea or loose stools. 120 tablet 1   divalproex (DEPAKOTE) 250 MG DR tablet Take 250 mg by mouth 2 (two) times daily.     DULoxetine (CYMBALTA) 60 MG capsule Take 60 mg by mouth at bedtime.      esomeprazole (NEXIUM) 40 MG capsule Take 40 mg by mouth daily. Pt states has been on for years     ibuprofen (ADVIL) 200 MG tablet Take 200 mg by mouth every 6 (six) hours as needed.     levofloxacin (LEVAQUIN) 500 MG tablet Take 1 tablet (500 mg total) by mouth daily for 7 days. 7 tablet 0   magic mouthwash (lidocaine, diphenhydrAMINE, alum & mag hydroxide) suspension Swish and swallow 5 mLs every 4 (four) hours as needed for mouth pain. 240 mL 0   magic mouthwash (lidocaine, diphenhydrAMINE, alum & mag hydroxide) suspension Swish and swallow 5 mLs every 4 (four) hours as needed for mouth pain. 240 mL 0   meloxicam (MOBIC) 7.5 MG tablet Take 7.5 mg by mouth  every morning.     Multiple Vitamin (MULTIVITAMIN WITH MINERALS) TABS tablet Take 1 tablet by mouth daily.     nortriptyline (PAMELOR) 10 MG capsule Take 10 mg by mouth.     ondansetron (ZOFRAN) 8 MG tablet Take 1 tablet (8 mg total) by mouth 4 (four) times daily as needed for nausea or vomiting. 120 tablet 0   oxyCODONE (OXY IR/ROXICODONE) 5 MG immediate release tablet SMARTSIG:5 Milligram(s) By Mouth Every 4 Hours PRN     potassium chloride SA (KLOR-CON M) 20 MEQ tablet Take 3 tablet today, then start 1 tablet twice daily 64 tablet 0   predniSONE (DELTASONE) 20 MG tablet Take 20 mg by mouth 2 (two) times daily.     prochlorperazine (COMPAZINE) 10 MG tablet Take 1 tablet (10 mg total) by mouth every 6 (six) hours as needed for nausea or vomiting. 30 tablet 1   SUMAtriptan (IMITREX) 25 MG tablet Take 25 mg by mouth.     Current Facility-Administered Medications   Medication Dose Route Frequency Provider Last Rate Last Admin   triamcinolone acetonide (KENALOG-40) injection 20 mg  20 mg Other Once Asencion Islam, DPM        PHYSICAL EXAMINATION:  ECOG PERFORMANCE STATUS: 1 - Symptomatic but completely ambulatory   Vitals:   05/23/23 1029  BP: 115/67  Pulse: (!) 112  Resp: 14  Temp: 98.4 F (36.9 C)  SpO2: 92%     Filed Weights   05/23/23 1029  Weight: 215 lb 12.8 oz (97.9 kg)      Physical Exam Vitals and nursing note reviewed.  Constitutional:      General: She is not in acute distress.    Appearance: She is obese. She is not ill-appearing, toxic-appearing or diaphoretic.     Comments: Here with husband.  Ill appearing  HENT:     Head: Normocephalic and atraumatic.     Right Ear: External ear normal.     Left Ear: External ear normal.     Nose: Nose normal. No congestion or rhinorrhea.     Mouth/Throat:     Mouth: Mucous membranes are dry.     Pharynx: No oropharyngeal exudate or posterior oropharyngeal erythema.  Eyes:     General: No scleral icterus.    Extraocular Movements: Extraocular movements intact.     Conjunctiva/sclera: Conjunctivae normal.     Pupils: Pupils are equal, round, and reactive to light.  Cardiovascular:     Rate and Rhythm: Normal rate.     Heart sounds: No murmur heard.    No friction rub. No gallop.  Abdominal:     General: Bowel sounds are normal.     Palpations: Abdomen is soft.     Tenderness: There is no abdominal tenderness. There is no guarding or rebound.  Musculoskeletal:        General: No swelling, tenderness or deformity.     Cervical back: Normal range of motion and neck supple. No rigidity or tenderness.     Right lower leg: No edema.     Left lower leg: No edema.  Lymphadenopathy:     Head:     Right side of head: No submental, submandibular, tonsillar, preauricular, posterior auricular or occipital adenopathy.     Left side of head: No submental, submandibular, tonsillar,  preauricular, posterior auricular or occipital adenopathy.     Cervical: No cervical adenopathy.     Right cervical: No superficial, deep or posterior cervical adenopathy.    Left cervical: No superficial,  deep or posterior cervical adenopathy.     Upper Body:     Right upper body: No supraclavicular, axillary, pectoral or epitrochlear adenopathy.     Left upper body: No supraclavicular, axillary, pectoral or epitrochlear adenopathy.  Skin:    General: Skin is warm and dry.     Coloration: Skin is not jaundiced.  Neurological:     General: No focal deficit present.     Mental Status: She is alert and oriented to person, place, and time.     Cranial Nerves: No cranial nerve deficit.     Comments: Seated in wheelchair.  Falls asleep during interview but arousable  Psychiatric:     Comments: Forgetful.  Talkative.  Insight not good      LABORATORY DATA: I have personally reviewed the data as listed:  Abstract on 05/21/2023  Component Date Value Ref Range Status   Glucose 05/21/2023 96   Final   BUN 05/21/2023 15  4 - 21 Final   CO2 05/21/2023 27 (A)  13 - 22 Final   Creatinine 05/21/2023 0.9  0.5 - 1.1 Final   Potassium 05/21/2023 3.4 (A)  3.5 - 5.1 mEq/L Final   Sodium 05/21/2023 135 (A)  137 - 147 Final   Chloride 05/21/2023 100  99 - 108 Final   Calcium 05/21/2023 9.5  8.7 - 10.7 Final   Albumin 05/21/2023 3.8  3.5 - 5.0 Final   Alkaline Phosphatase 05/21/2023 76  25 - 125 Final   ALT 05/21/2023 17  7 - 35 U/L Final   AST 05/21/2023 38 (A)  13 - 35 Final   Bilirubin, Total 05/21/2023 1.1   Final   Magnesium 05/21/2023 1.70   Final  Abstract on 05/21/2023  Component Date Value Ref Range Status   Hemoglobin 05/21/2023 10.4 (A)  12.0 - 16.0 Final   HCT 05/21/2023 30 (A)  36 - 46 Final   Neutrophils Absolute 05/21/2023 1.75   Final   Platelets 05/21/2023 50 (A)  150 - 400 K/uL Final   WBC 05/21/2023 3.8   Final   RBC 05/21/2023 3.28 (A)  3.87 - 5.11 Final  Appointment on  05/21/2023  Component Date Value Ref Range Status   CBC with Differential 05/21/2023 Sent to [facility] for testing. See scanned report.   Final   Performed at [facility], [address] [place], [place] [address]   Comprehensive Metabolic Panel 05/21/2023 Sent to [facility] for testing. See scanned report.   Final   Performed at [facility], [address] [place], [place] [address]  Abstract on 05/13/2023  Component Date Value Ref Range Status   Hemoglobin 05/13/2023 10.8 (A)  12.0 - 16.0 Final   HCT 05/13/2023 31 (A)  36 - 46 Final   Neutrophils Absolute 05/13/2023 2.84   Final   Platelets 05/13/2023 91 (A)  150 - 400 K/uL Final   WBC 05/13/2023 3.5   Final   RBC 05/13/2023 3.43 (A)  3.87 - 5.11 Final   Glucose 05/13/2023 148   Final   BUN 05/13/2023 9  4 - 21 Final   CO2 05/13/2023 29 (A)  13 - 22 Final   Creatinine 05/13/2023 0.6  0.5 - 1.1 Final   Potassium 05/13/2023 3.3 (A)  3.5 - 5.1 mEq/L Final   Sodium 05/13/2023 137  137 - 147 Final   Chloride 05/13/2023 102  99 - 108 Final   Calcium 05/13/2023 9.1  8.7 - 10.7 Final  Albumin 05/13/2023 3.6  3.5 - 5.0 Final   Alkaline Phosphatase 05/13/2023 82  25 - 125 Final   ALT 05/13/2023 18  7 - 35 U/L Final   AST 05/13/2023 38 (A)  13 - 35 Final   Bilirubin, Total 05/13/2023 1.1   Final   Magnesium 05/13/2023 1.90   Final  Appointment on 05/09/2023  Component Date Value Ref Range Status   Potassium 05/09/2023 3.0 (L)  3.5 - 5.1 mmol/L Final   Performed at [facility], [address], [place], [place] [address]   Magnesium 05/09/2023 1.7  1.7 - 2.4 mg/dL Final   Performed at [facility], [address] [place], [place] [address]  Appointment on 05/08/2023  Component Date Value Ref Range Status   Magnesium 05/08/2023 1.9  1.7 - 2.4 mg/dL Final   Performed at [facility], [address], [place], [place] [address]   Office Visit on 05/08/2023  Component Date Value Ref Range Status   WBC 05/08/2023 3.3 (L)  4.0 - 10.5 K/uL Final   RBC 05/08/2023 3.26 (L)  3.87 - 5.11 MIL/uL Final   Hemoglobin 05/08/2023 10.2 (L)  12.0 - 15.0 g/dL Final   HCT 51/88/4166 29.7 (L)  36.0 - 46.0 % Final   MCV 05/08/2023 91.1  80.0 - 100.0 fL Final   MCH 05/08/2023 31.3  26.0 - 34.0 pg Final   MCHC 05/08/2023 34.3  30.0 - 36.0 g/dL Final   RDW 04/20/1600 17.4 (H)  11.5 - 15.5 % Final   Platelets 05/08/2023 89 (L)  150 - 400 K/uL Final   Comment: SPECIMEN CHECKED FOR CLOTS Immature Platelet Fraction may be clinically indicated, consider ordering this additional test [id number] REPEATED TO VERIFY PLATELET COUNT CONFIRMED BY SMEAR    nRBC 05/08/2023 0.0  0.0 - 0.2 % Final   Neutrophils Relative % 05/08/2023 52  % Final   Neutro Abs 05/08/2023 1.7  1.7 - 7.7 K/uL Final   Lymphocytes Relative 05/08/2023 21  % Final   Lymphs Abs 05/08/2023 0.7  0.7 - 4.0 K/uL Final   Monocytes Relative 05/08/2023 23  % Final   Monocytes Absolute 05/08/2023 0.8  0.1 - 1.0 K/uL Final   Eosinophils Relative 05/08/2023 4  % Final   Eosinophils Absolute 05/08/2023 0.1  0.0 - 0.5 K/uL Final   Basophils Relative 05/08/2023 0  % Final   Basophils Absolute 05/08/2023 0.0  0.0 - 0.1 K/uL Final   Immature Granulocytes 05/08/2023 0  % Final   Abs Immature Granulocytes 05/08/2023 0.01  0.00 - 0.07 K/uL Final   Performed at [facility], [address] [place], [place] [address]   Sodium 05/08/2023 138  135 - 145 mmol/L Final   Potassium 05/08/2023 2.5 (LL)  3.5 - 5.1 mmol/L Final   Comment: CRITICAL RESULT CALLED TO, READ BACK BY AND VERIFIED WITH DUNLAP, K RN @ 1432 ON 05/08/2023 BY ABDULHALIM,M    Chloride 05/08/2023 102  98 - 111 mmol/L Final   CO2 05/08/2023 26  22 - 32 mmol/L Final   Glucose, Bld 05/08/2023 100 (H)  70 - 99 mg/dL Final   Glucose reference range applies only to samples taken after fasting for at least 8  hours.   BUN 05/08/2023 12  8 - 23 mg/dL Final   Creatinine, Ser 05/08/2023 0.86  0.44 - 1.00 mg/dL Final   Calcium 54/27/0623 8.8 (L)  8.9 - 10.3 mg/dL Final   Total Protein 76/28/3151 6.1 (  L)  6.5 - 8.1 g/dL Final   Albumin 44/10/270 3.8  3.5 - 5.0 g/dL Final   AST 53/66/4403 29  15 - 41 U/L Final   ALT 05/08/2023 20  0 - 44 U/L Final   Alkaline Phosphatase 05/08/2023 62  38 - 126 U/L Final   Total Bilirubin 05/08/2023 1.3 (H)  0.3 - 1.2 mg/dL Final   GFR, Estimated 05/08/2023 >60  >60 mL/min Final   Comment: (NOTE) Calculated using the CKD-EPI Creatinine Equation (2021)    Anion gap 05/08/2023 10  5 - 15 Final   Performed at [facility], [address] [place], [place] [address]  Appointment on 04/30/2023  Component Date Value Ref Range Status   Sodium 04/30/2023 138  135 - 145 mmol/L Final   Potassium 04/30/2023 3.4 (L)  3.5 - 5.1 mmol/L Final   Chloride 04/30/2023 104  98 - 111 mmol/L Final   CO2 04/30/2023 23  22 - 32 mmol/L Final   Glucose, Bld 04/30/2023 103 (H)  70 - 99 mg/dL Final   Glucose reference range applies only to samples taken after fasting for at least 8 hours.   BUN 04/30/2023 19  8 - 23 mg/dL Final   Creatinine, Ser 04/30/2023 0.92  0.44 - 1.00 mg/dL Final   Calcium 95/63/8756 9.1  8.9 - 10.3 mg/dL Final   Total Protein 43/32/9518 6.9  6.5 - 8.1 g/dL Final   Albumin 84/16/6063 4.1  3.5 - 5.0 g/dL Final   AST 01/60/1093 32  15 - 41 U/L Final   ALT 04/30/2023 21  0 - 44 U/L Final   Alkaline Phosphatase 04/30/2023 63  38 - 126 U/L Final   Total Bilirubin 04/30/2023 1.1  0.3 - 1.2 mg/dL Final   GFR, Estimated 04/30/2023 >60  >60 mL/min Final   Comment: (NOTE) Calculated using the CKD-EPI Creatinine Equation (2021)    Anion gap 04/30/2023 11  5 - 15 Final   Performed at [facility], [address] [place], [place] [address]   WBC 04/30/2023 4.7  4.0 - 10.5 K/uL Final   RBC 04/30/2023 3.79 (L)  3.87 - 5.11 MIL/uL Final    Hemoglobin 04/30/2023 11.6 (L)  12.0 - 15.0 g/dL Final   HCT 32/20/2542 34.1 (L)  36.0 - 46.0 % Final   MCV 04/30/2023 90.0  80.0 - 100.0 fL Final   MCH 04/30/2023 30.6  26.0 - 34.0 pg Final   MCHC 04/30/2023 34.0  30.0 - 36.0 g/dL Final   RDW 70/62/3762 16.8 (H)  11.5 - 15.5 % Final   Platelets 04/30/2023 61 (L)  150 - 400 K/uL Final   Comment: Immature Platelet Fraction may be clinically indicated, consider ordering this additional test [id number] REPEATED TO VERIFY PLATELET COUNT CONFIRMED BY SMEAR    nRBC 04/30/2023 0.0  0.0 - 0.2 % Final   Neutrophils Relative % 04/30/2023 60  % Final   Neutro Abs 04/30/2023 2.8  1.7 - 7.7 K/uL Final   Lymphocytes Relative 04/30/2023 23  % Final   Lymphs Abs 04/30/2023 1.1  0.7 - 4.0 K/uL Final   Monocytes Relative 04/30/2023 13  % Final   Monocytes Absolute 04/30/2023 0.6  0.1 - 1.0 K/uL Final   Eosinophils Relative 04/30/2023 4  % Final   Eosinophils Absolute 04/30/2023 0.2  0.0 - 0.5 K/uL Final   Basophils Relative 04/30/2023 0  % Final   Basophils Absolute 04/30/2023 0.0  0.0 - 0.1 K/uL Final   Immature  Granulocytes 04/30/2023 0  % Final   Abs Immature Granulocytes 04/30/2023 0.02  0.00 - 0.07 K/uL Final   Performed at [facility], [address], [place], [place] [address]   Magnesium 04/30/2023 1.9  1.7 - 2.4 mg/dL Final   Performed at [facility], [address], [place], [place] [address]    RADIOGRAPHIC STUDIES: I have personally reviewed the radiological images as listed and agree with the findings in the report  No results found.  ASSESSMENT/PLAN  67 y.o. female is here because of  colon cancer.  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 Mass was partially circumferential (involves < 1/3rd of the lumen circumference). 10 mm polyp in proximal ascending colon which was sessile.  Pathology ulcerated mass- invasive moderately differentiated adenocarcinoma arising within a tubular adenoma with high-grade dysplasia   January 15 2023- CEA 2.9 Chromogranin A 564  January 17 2023- CT CAP  Short segment asymmetric wall thickening of the transverse colon measuring 3.3 cm in length.   Small adjacent soft tissue nodule/lymph nodes adjacent to the colonic wall thickening measuring 6 mm, suspicious  for local nodal disease. Prominent/mildly enlarged right upper quadrant lymph nodes similar to January 26, 2019 and favored reactive.  No distant metastatic disease.  Hepatomegaly with nodular hepatic contour, suggestive of cirrhosis Cholelithiasis without findings of acute cholecystitis.   Incidental right thyroid nodule   January 23 2023- Chromogranin A 294  February 04 2023:  Transverse colectomy.  Pathology showed adenocarcinoma invading visceral peritoneum, Grade 3.  All of 30 lymph nodes negative for tumor.  MSI intact Feb 25 2023- On basis of spontaneous improvement in chromogranin A will hold on Dodatate PET.     Therapeutics-  Feb 25 2023- Reviewed [facility] treatment guidelines with patient and husband.  They are agreeable to proceed with adjuvant CAPOX x 6 months.   Mar 11 2023- Cycle 1 XelOx Mar 20 2023- Tolerated cycle 1 well with only some mild cold neuropathy April 01 2023- Cycle 2 XelOx   April 22 2023: Cycle 3 XelOx.              April 30 2023- Experiencing mucositis, anorexia, nausea and diarrhea.    May 13 2023: Cycle 4 XelOX.  Dose reduced Xeloda from 2000 mg bid to 1500 mg bid due to GI symptoms.  This will be her last cycle of adjuvant therapy  May 23 2023- Stopping Xeloda due to side effects and referring for  admission.  Contacted ED attending   Chemotherapy induced nausea and diarrhea             April 30 2023-  Instructed patient to do the following-- Take Ondansetron 8 mg, four times daily.  Take Imodium 4 mg every 4 hrs for diarrhea Use the magic mouthwash every 4 hours while awake.  Arranged for patient to receive IVF and antiemetics.  To follow up in 2 days to assess progress     May 02 2023- To complete this course of Xeloda on July 15th.  Should improve with the week off between then and beginning of Cycle 4.  Anticipate dose reduction in Xeloda with  Cycle 4.  Sx under better control today than at last visit due to better use of supportive care May 08 2023-  Continues to have 5 to 6 bowel movements daily.  Using imodium 2 mg q 5 hrs.  Instructed patient to use imodium 4 mg po q 4 hrs PRN diarrhea Adding lomotil up to qid PRN.  Has required potassium replacement due to diarrheal losses May 21 2023- Continues to have significant chemotherapy induced diarrhea.  Not using supportive care medications to maximum potential. Provided written instructions to patient and husband again today.  Adding Levaquin 500 mg daily.  Sending stool for C diff.  Referring to infusion for IVF.  Chemistries sent to guide electrolyte replacement.  Follow up in 2 days.   Sent testing to evaluate for DPD deficiency.  Discussed plans with clinical pharmacist.   May 23 2023- Diarrhea improved with use of scheduled imodium, lomotil, ondansetron and since starting Levaquin daily.  Remains dehydrated as judged by exam (mucosa and skin dry, tachycardic)  Mental status changes  May 23 2023- Likely multifactorial- 1) psychotropic medications (welbutrin, cymbalta, nortryptiline, depakote)  2) supportive care medications (Flexeril, lomotil, imodium, ondansetron)  3) chemotherapy (Xeloda) 4) Dehydration 5) underlying liver disease  Recommended admission via ED for evaluation and management.  Considering use of uridine triacetate as  antidote for possible 5 FU toxicity    Diarrhea:             January 15 2023- Not explained by the colon cancer.  No evidence of inflammatory bowel disease by colonoscopy.    Chromogranin A 564  January 23 2023- Repeat Chromogranin A improved  Feb 25 2023- Diarrhea and chromogranin A improved therefore holding on Dotatate PET/CT    Poor venous access:    Mar 11 2023- Port placement   Headaches Mar 07 2023- Improved following surgery likely related to a component of general anesthesia.  To see neurology  Possible cirrhosis  January 17 2023- Nodular liver contour noted on CT CAP    Cancer Staging  Colon cancer [facility]) Staging form: Colon and Rectum, AJCC 8th Edition - Clinical stage from 02/25/2023: Stage IIB (cT4a, cN0, cM0) - Signed by Loni Muse, MD on 02/25/2023 Histopathologic type: Adenocarcinoma, NOS Stage prefix: Initial diagnosis Total positive nodes: 0 Total nodes examined: 30 Histologic grade (G): G3 Histologic grading system: 4 grade system    No problem-specific Assessment & Plan notes found for this encounter.    No orders of the defined types were placed in this encounter.   40  minutes was spent in patient care.  This included time spent preparing to see the patient (e.g., review of tests), obtaining and/or reviewing separately obtained history, counseling and educating the patient/family/caregiver, ordering medications, tests, or procedures; documenting clinical information in the electronic or other health record, independently interpreting results and communicating results to the patient/family/caregiver as well as coordination of care.       All questions were answered. The patient knows to call the clinic with any problems, questions or concerns.  This note was electronically signed.    Loni Muse, MD  05/23/2023 10:30 AM

## 2023-05-24 ENCOUNTER — Ambulatory Visit: Payer: Medicare PPO

## 2023-05-24 ENCOUNTER — Other Ambulatory Visit: Payer: Self-pay

## 2023-05-24 DIAGNOSIS — C184 Malignant neoplasm of transverse colon: Secondary | ICD-10-CM | POA: Diagnosis not present

## 2023-05-25 DIAGNOSIS — R768 Other specified abnormal immunological findings in serum: Secondary | ICD-10-CM | POA: Diagnosis not present

## 2023-05-25 DIAGNOSIS — Z79899 Other long term (current) drug therapy: Secondary | ICD-10-CM | POA: Diagnosis not present

## 2023-05-25 DIAGNOSIS — E876 Hypokalemia: Secondary | ICD-10-CM | POA: Diagnosis not present

## 2023-05-25 DIAGNOSIS — F32A Depression, unspecified: Secondary | ICD-10-CM | POA: Diagnosis not present

## 2023-05-25 DIAGNOSIS — K521 Toxic gastroenteritis and colitis: Secondary | ICD-10-CM | POA: Diagnosis not present

## 2023-05-25 DIAGNOSIS — D61810 Antineoplastic chemotherapy induced pancytopenia: Secondary | ICD-10-CM | POA: Diagnosis not present

## 2023-05-25 DIAGNOSIS — T451X5A Adverse effect of antineoplastic and immunosuppressive drugs, initial encounter: Secondary | ICD-10-CM | POA: Diagnosis not present

## 2023-05-25 DIAGNOSIS — Z9049 Acquired absence of other specified parts of digestive tract: Secondary | ICD-10-CM | POA: Diagnosis not present

## 2023-05-25 DIAGNOSIS — F1090 Alcohol use, unspecified, uncomplicated: Secondary | ICD-10-CM | POA: Diagnosis not present

## 2023-05-25 DIAGNOSIS — R000 Tachycardia, unspecified: Secondary | ICD-10-CM | POA: Diagnosis not present

## 2023-05-25 DIAGNOSIS — D61818 Other pancytopenia: Secondary | ICD-10-CM | POA: Diagnosis not present

## 2023-05-25 DIAGNOSIS — G928 Other toxic encephalopathy: Secondary | ICD-10-CM | POA: Diagnosis not present

## 2023-05-25 DIAGNOSIS — D696 Thrombocytopenia, unspecified: Secondary | ICD-10-CM | POA: Diagnosis not present

## 2023-05-25 DIAGNOSIS — Z87891 Personal history of nicotine dependence: Secondary | ICD-10-CM | POA: Diagnosis not present

## 2023-05-25 DIAGNOSIS — R197 Diarrhea, unspecified: Secondary | ICD-10-CM | POA: Diagnosis not present

## 2023-05-25 DIAGNOSIS — C189 Malignant neoplasm of colon, unspecified: Secondary | ICD-10-CM | POA: Diagnosis not present

## 2023-05-25 DIAGNOSIS — R079 Chest pain, unspecified: Secondary | ICD-10-CM | POA: Diagnosis not present

## 2023-05-25 DIAGNOSIS — Z862 Personal history of diseases of the blood and blood-forming organs and certain disorders involving the immune mechanism: Secondary | ICD-10-CM | POA: Diagnosis not present

## 2023-05-25 DIAGNOSIS — R9431 Abnormal electrocardiogram [ECG] [EKG]: Secondary | ICD-10-CM | POA: Diagnosis not present

## 2023-05-27 ENCOUNTER — Encounter: Payer: Self-pay | Admitting: Oncology

## 2023-05-29 ENCOUNTER — Other Ambulatory Visit: Payer: Self-pay

## 2023-05-29 ENCOUNTER — Inpatient Hospital Stay: Payer: Medicare PPO

## 2023-05-29 DIAGNOSIS — Z79899 Other long term (current) drug therapy: Secondary | ICD-10-CM | POA: Diagnosis not present

## 2023-05-29 DIAGNOSIS — E860 Dehydration: Secondary | ICD-10-CM | POA: Diagnosis not present

## 2023-05-29 DIAGNOSIS — E876 Hypokalemia: Secondary | ICD-10-CM | POA: Diagnosis not present

## 2023-05-29 DIAGNOSIS — Z5111 Encounter for antineoplastic chemotherapy: Secondary | ICD-10-CM | POA: Diagnosis not present

## 2023-05-29 DIAGNOSIS — D649 Anemia, unspecified: Secondary | ICD-10-CM | POA: Diagnosis not present

## 2023-05-29 DIAGNOSIS — C184 Malignant neoplasm of transverse colon: Secondary | ICD-10-CM

## 2023-05-29 DIAGNOSIS — D6959 Other secondary thrombocytopenia: Secondary | ICD-10-CM | POA: Diagnosis not present

## 2023-05-29 DIAGNOSIS — M3119 Other thrombotic microangiopathy: Secondary | ICD-10-CM

## 2023-05-29 DIAGNOSIS — D594 Other nonautoimmune hemolytic anemias: Secondary | ICD-10-CM | POA: Diagnosis not present

## 2023-05-29 DIAGNOSIS — R932 Abnormal findings on diagnostic imaging of liver and biliary tract: Secondary | ICD-10-CM | POA: Diagnosis not present

## 2023-05-29 DIAGNOSIS — R531 Weakness: Secondary | ICD-10-CM | POA: Diagnosis not present

## 2023-05-29 DIAGNOSIS — Z23 Encounter for immunization: Secondary | ICD-10-CM | POA: Diagnosis not present

## 2023-05-29 LAB — CBC WITH DIFFERENTIAL (CANCER CENTER ONLY)
Abs Immature Granulocytes: 0.04 10*3/uL (ref 0.00–0.07)
Basophils Absolute: 0 10*3/uL (ref 0.0–0.1)
Basophils Relative: 1 %
Eosinophils Absolute: 0 10*3/uL (ref 0.0–0.5)
Eosinophils Relative: 0 %
HCT: 30.4 % — ABNORMAL LOW (ref 36.0–46.0)
Hemoglobin: 10.1 g/dL — ABNORMAL LOW (ref 12.0–15.0)
Immature Granulocytes: 2 %
Lymphocytes Relative: 18 %
Lymphs Abs: 0.4 10*3/uL — ABNORMAL LOW (ref 0.7–4.0)
MCH: 32.9 pg (ref 26.0–34.0)
MCHC: 33.2 g/dL (ref 30.0–36.0)
MCV: 99 fL (ref 80.0–100.0)
Monocytes Absolute: 0.3 10*3/uL (ref 0.1–1.0)
Monocytes Relative: 13 %
Neutro Abs: 1.5 10*3/uL — ABNORMAL LOW (ref 1.7–7.7)
Neutrophils Relative %: 66 %
Platelet Count: 84 10*3/uL — ABNORMAL LOW (ref 150–400)
RBC: 3.07 MIL/uL — ABNORMAL LOW (ref 3.87–5.11)
RDW: 22.2 % — ABNORMAL HIGH (ref 11.5–15.5)
WBC Count: 2.3 10*3/uL — ABNORMAL LOW (ref 4.0–10.5)
nRBC: 0 % (ref 0.0–0.2)

## 2023-05-29 LAB — DIRECT ANTIGLOBULIN TEST (NOT AT ARMC)
DAT, IgG: NEGATIVE
DAT, complement: NEGATIVE

## 2023-05-29 LAB — LACTATE DEHYDROGENASE: LDH: 255 U/L — ABNORMAL HIGH (ref 98–192)

## 2023-05-30 ENCOUNTER — Encounter: Payer: Self-pay | Admitting: Oncology

## 2023-05-30 ENCOUNTER — Inpatient Hospital Stay: Payer: Medicare PPO | Admitting: Oncology

## 2023-05-30 VITALS — BP 116/63 | HR 96 | Temp 97.6°F | Resp 18 | Ht 65.9 in | Wt 220.1 lb

## 2023-05-30 DIAGNOSIS — K5792 Diverticulitis of intestine, part unspecified, without perforation or abscess without bleeding: Secondary | ICD-10-CM

## 2023-05-30 DIAGNOSIS — R110 Nausea: Secondary | ICD-10-CM | POA: Diagnosis not present

## 2023-05-30 DIAGNOSIS — K521 Toxic gastroenteritis and colitis: Secondary | ICD-10-CM | POA: Diagnosis not present

## 2023-05-30 DIAGNOSIS — Z09 Encounter for follow-up examination after completed treatment for conditions other than malignant neoplasm: Secondary | ICD-10-CM | POA: Diagnosis not present

## 2023-05-30 DIAGNOSIS — Z862 Personal history of diseases of the blood and blood-forming organs and certain disorders involving the immune mechanism: Secondary | ICD-10-CM

## 2023-05-30 DIAGNOSIS — T451X5A Adverse effect of antineoplastic and immunosuppressive drugs, initial encounter: Secondary | ICD-10-CM | POA: Diagnosis not present

## 2023-05-30 DIAGNOSIS — C184 Malignant neoplasm of transverse colon: Secondary | ICD-10-CM

## 2023-05-30 DIAGNOSIS — R197 Diarrhea, unspecified: Secondary | ICD-10-CM

## 2023-05-30 DIAGNOSIS — M311 Thrombotic microangiopathy, unspecified: Secondary | ICD-10-CM | POA: Diagnosis not present

## 2023-05-30 MED ORDER — LEVOFLOXACIN 500 MG PO TABS: 500.0000 mg | ORAL_TABLET | Freq: Every day | ORAL | 0 refills | Status: DC

## 2023-05-30 MED ORDER — METRONIDAZOLE 500 MG PO TABS: 500.0000 mg | ORAL_TABLET | Freq: Three times a day (TID) | ORAL | 0 refills | Status: DC

## 2023-05-30 NOTE — Progress Notes (Signed)
[facility] Cancer Initial Visit:  Patient Care Team: Hurshel Party, NP as PCP - General (Internal Medicine) Loni Muse, MD as Consulting Physician (Internal Medicine)  CHIEF COMPLAINTS/PURPOSE OF CONSULTATION:  Oncology History  Colon cancer [facility])  01/15/2023 Initial Diagnosis   Colon cancer (HCC)   02/25/2023 Cancer Staging   Staging form: Colon and Rectum, AJCC 8th Edition - Clinical stage from 02/25/2023: Stage IIB (cT4a, cN0, cM0) - Signed by Loni Muse, MD on 02/25/2023 Histopathologic type: Adenocarcinoma, NOS Stage prefix: Initial diagnosis Total positive nodes: 0 Total nodes examined: 30 Histologic grade (G): G3 Histologic grading system: 4 grade system   03/11/2023 - 05/13/2023 Chemotherapy   Patient is on Treatment Plan : COLORECTAL Xelox (Capeox)(130/850) q21d       HISTORY OF PRESENTING ILLNESS: [name] 67 y.o. female is here because of  colon cancer Medical history notable for septic arthritis of the hip, obesity, atypical chest pain, skin cancer, carpal tunnel syndrome, chronic vaginitis, chronic venous and sufficiency, TTP, syncope  January 10, 2023: Colonoscopy-ulcerated 2 cm nonobstructing small mass with heaped up margins found in distal transverse colon.  Mass was partially circumferential (involves less than one third of the lumen circumference) no bleeding present.  10 mm polyp in proximal ascending colon which was sessile.  Pathology-Ascending colon polyp was compatible with a sessile serrated adenoma without cytologic dysplasia Transverse colon polyp-invasive moderately differentiated adenocarcinoma arising within a tubular adenoma with high-grade dysplasia  January 15 2023: [facility] Medical Oncology Consult Patient reports that the colonoscopy was performed for surveillance; however, she was also having episodes of intermittent diarrhea x 2 to 3 months.  Stools were not bloody, nor was she passing mucus but they were soft.  In  association with these episodes she would also feel dizzy and note an odd smell, nausea.   To see Dr. Logan Bores from surgery on January 22 2023   Social:  Married.  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  Tobacco quit 24 years.  Rare glass of wine  [facility] Mother alive 61 epileptic, dementia Father died 30 Alzheimers, prostate cancer Brother alive 39 arthritis requiring joint replacement,   WBC 6.4 hemoglobin 12.5 MCV 88 platelet count 178 normal differential CMP normal CEA 2.9 chromogranin A 564  January 17 2023:  CT CAP Short segment asymmetric wall thickening of the transverse colon measuring 3.3 cm in length, compatible with reported history of  primary colonic neoplasm.  Small adjacent soft tissue nodule/lymph nodes adjacent to the colonic wall thickening measuring 6 mm, nonspecific are suspicious  for local nodal disease. Prominent/mildly enlarged right upper quadrant lymph nodes are similar dating back to January 26, 2019 and favored reactive.  No convincing evidence of distant metastatic disease within the chest, abdomen or pelvis.  Hepatomegaly with nodular hepatic contour, suggestive of cirrhosis Cholelithiasis without findings of acute cholecystitis.   Incidental right thyroid nodule measuring   January 18 2023:  Presented to [facility] ED with headaches and chest discomfort.  CT PA negative for PE.  CT head without contrast negative.    January 22 2023:  Scheduled follow up for colon cancer.  Reviewed results of labs and imaging with patient and husband. Will repeat chromgranin A and refer for PET/CT if chromogranin A still significantly elevated.    January 23, 2023: Chromogranin A 294  February 04 2023:  Transverse colectomy Pathology showed adenocarcinoma invading visceral peritoneum, Grade 3.  All of 30 lymph nodes were negative for tumor.  Surgical Stage (T4a, N0 M0)  MSI intact  Feb 25, 2023:   Reviewed results of surgical pathology with patient and husband.  Per NCCN guidelines adjuvant  options are CAPEOX, FOLFOX 6 months, Capecitabine or observation.  Discussed risks and benefits of each  Mar 07 2023:   Bifrontal headaches have returned.  These began in October 2023 but resolved after her colon surgery.  Has seen neurology and underwent MRI brain negative for CNS lesion.  EEG normal.   (Raises the question of what medications in perioperative period helped with the HA's.)  Recovering from a dog bite on dorsum of right hand.  Will receive Capecitabine today.    Mar 11 2023:  Port placement.  Cycle 1 XelOx   Mar 19 2023:  Neurology follow up.    Mar 20 2023:   Agree with Neurology assessment that neurocognitive testing should not be performed it at all, until well after chemotherapy has been completed.  Experienced some mild nausea helped by antiemetics.   Has occasional HA's.  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   No HFS.     April 01 2023:  Cycle 2 XelOx  April 17 2023:  Has lost 4 lbs.  Appetite diminished due to dysgeusia.  No metallic taste.  Edges of tongue get red and sore and painful.  Mouth feels dry despite drinking a lot of water and tea.  Not using anything for her mouth sores.   Fatigue increased.  Has cold neuropathy and cold induced pharyngospasm.      Begin Magic mouthwash for mucositis.     April 22 2023:  Cycle 3 XelOx.    April 30 2023:    Patient called office yesterday stating that she was anorectic, nauseated, having mouth sores and diarrhea.  Has lost 13 lbs.  Has difficulty remembering details which complicates history taking.  States that anorexia began after starting this round of chemotherapy.  "Food does not get past my tongue"; did not recognize this was nausea so did not begin taking antiemetics until 3 days ago.  Only taking antiemetics bid.  Mouth feels dry but not painful.  No emesis.  Began with diarrhea in the form of 5 to 6 stools per day but does not believe that she is taking imodium for it.  Dysgeusia  even to water.   Not using magic mouthwash regularly  May 02 2023:    Diarrhea improved with imodium.  Using ondansetron alternating with compazine; no emesis with improvement in nausea.  Eating limited by mucositis which she can't say is helped by magic mouthwash.  Not eating cold foods.  Has gained 2 lbs in the last two days.  To complete the course of Xeloda on May 06 2023.    WBC 4.7 hemoglobin 11.6 platelet count 61; 60 segs 23 lymphs 13 monos 4 eos CMP notable for potassium 3.4 glucose 103  May 08 2023:   Scheduled follow-up regarding colon cancer. Has lost 4 lbs since last visit owing to anorexia.   No mouth sores but has dysgeusia and mouth feels dry.  Using magic mouthwash.  Nauseated but without emesis.  Continues to have 5 to 6 bowel movements daily.  Using imodium 2 mg q 5 hrs.    Instructed patient to use imodium 4 mg po q 4 hrs PRN diarrhea Adding lomotil up to qid PRN Xeloda has not been shipped to patient.   Will dose reduce Xeloda from 2000 mg bid to  1500 mg bid.    May 13 2023:  Cycle 4 XelOX.  Received total of 1 liter IVF during that visit due to hypotension, potassium due to hypokalemia.  Patient was told to increase dose of supplemental potassium  May 21 2023:  Seen as a work in due to side effects from chemotherapy.  Has lost 11 lbs since last visit.  Has been experiencing liquid stools sometimes almost hourly.  No nausea, emesis, mouth sores. Abdominal cramping. No fevers.  Appetite decreased.   Taking imodium 2 mg bid.  Not certain if she is taking lomotil.   Received IVF, IV potassium in infusion center WBC 3.8 hemoglobin 10.4 platelet count 50 CMP notable for potassium 3.4 AST 38 albumin 3.8 magnesium 1.7 DPD 5-fluorouracil toxicity panel sent  May 22 2023:  Fell backwards on steps outside.  Struck right elbow and knee but not head.  Skin tear to right elbow.   May 23, 2023: Scheduled follow-up to monitor side effects from chemotherapy.  Anorectic but eating  some.  Has gained 1 lbs.  Today experienced flash HA and accompanied by chest pain which last 2 to 4 minutes.  Yesterday had three diarrheal stools.  Significant improvement since taking Ondansetron, Lomotil and Imodium on scheduled basis.  Spending most of time sleeping.  Lost balance twice today.  Stopped Xeloda and arranged for hospitalization at [facility].    August 1 through May 25 2023:  Admitted to [facility] on admission WBC 2.9 Hgb 11.9 PLT 28  ANC 1.91 INR 1.2 PTT 28.1 Potassium 3.2 Lactate 0.9  Procalcitonin < 0.05 Stool for C diff negative CT brain without contrast negative  Given lack of improvement in clinical picture and inability to obtain uridine triacetate, the antidote for 5FU toxicity patient was transferred to [facility].  May 25 2023 through May 28 2023:  Admission to [facility]  Potential concern for Xeloda toxicity at this time however will hold off on uridine triacetate therapy and get DPD enzyme deficiency labs to see if pt can tolerate Xeloda going forward with treatment given persistent diarrhea. A diagnosis of TTP was entertained but dismissed as "patient does not have fever and does not have kidney impairment on CMP. Suspicion for TTP as the primary etiology is low. Likely that her mental status and CBC derangements are more likely due to capecitabine use."   Labs were notable negative GI Pathogen panel which included Shiga Toxin-Producing E. coli, E. coli 0157, Enteropathogenic E. coli, Enteroaggregative E. coli, Enterotoxigenic E. coli, Shigella spp.,  WBC2.7 Hgb 8.4 PLT 37 Haptoglobin < 30 Retic 2.1  LDH 239 Cr 0.63  Alb 2.0 INR 1.2 D dimer 0.3  Fibrinogen 202  INR 1.1 U/A trace ketones ADAMS TS 13 was drawn but patient was discharged home before results known  May 30 2023:  Post hospital follow up.  Has gained 5 lbs due to edema acquired during hospitalization.  Did not bring walker today but had one yesterday. Still having watery four to five  times daily.  Experiencing LLQ abdominal pain.  Anorectic.  Using imodium twice daily.  Husband not certain how many times she is using lomotil daily.  May be using ondansetron bid.  Memory not good but may be improving slightly.  Will start Levaquin/Flagyl for possible diverticulitis.   Assay for DPD toxicity still in process Obtained labs evening prior to today's visit WBC 2.3 Hgb 10.1 PLT 84  LDH 255 DAT negative Haptoglobin < 10  DAT negative ADAMSTS13 activity  67%   Review of Systems  Constitutional:  Positive for appetite change, fatigue and unexpected weight change. Negative for chills and fever.  HENT:   Negative for mouth sores, nosebleeds, sore throat, tinnitus and trouble swallowing.   Eyes:  Negative for eye problems and icterus.       Vision changes:  None  Respiratory:  Negative for chest tightness, hemoptysis, shortness of breath and wheezing.        One month of nonproductive cough.  No history of seasonable allergies  Cardiovascular:  Negative for chest pain and leg swelling.  Gastrointestinal:  Positive for diarrhea and nausea. Negative for constipation, rectal pain and vomiting.  Endocrine: Negative for hot flashes.       Cold intolerance:  none Heat intolerance:  none  Genitourinary:  Negative for bladder incontinence, difficulty urinating, dysuria, frequency, hematuria and nocturia.   Musculoskeletal:  Positive for gait problem. Negative for arthralgias, back pain, myalgias, neck pain and neck stiffness.  Skin:  Negative for itching, rash and wound.  Neurological:  Positive for dizziness, extremity weakness, gait problem and headaches. Negative for numbness, seizures and speech difficulty.  Hematological:  Negative for adenopathy. Bruises/bleeds easily.  Psychiatric/Behavioral:  Positive for confusion. Negative for sleep disturbance and suicidal ideas. The patient is not nervous/anxious.     MEDICAL HISTORY: Past Medical History:  Diagnosis Date   Abnormal  glucose    Achilles tendinitis of left lower extremity    Acute suppurative arthritis due to bacteria (HCC) 03/09/2011   Overview:  Last Assessment & Plan:  Methicillin sensitive staphylococcal aureus septic hip. She clearly does need I&D of the hip. I will have her continue the doxycycline for now but stop it 7 days prior to her surgery to maximize the yield on the cultures in the operating room though I be shocked if we don't find methicillin sensitive staph aureus again.. I agree with removing as much of the pros   Anal fissure 11/30/2015   Arthritis    right hip   Atypical chest pain 04/09/2016   Bilateral edema of lower extremity    BMI 39.0-39.9,adult    Cancer [facility])    SKIN CANCER   Carpal tunnel syndrome 03/09/2011   Overview:  Last Assessment & Plan:  Clinically this seems to be carpal tunnel syndrome. Giving given her history of infection though we can keep in the back of our mind the idea that she will have a cervical spine problem but I think this is unlikely at this point in time medics carpal tunnel syndrome fits clinically this was a positive Tinel's sign however in for a brace for her.   Chronic vaginitis    Chronic venous insufficiency    Depression    Dyslipidemia    Fatigue 10/13/2015   Fibromyalgia    GERD without esophagitis    History of immune thrombocytopenia 03/09/2011   History of operative procedure on hip 03/10/2012   History of TTP (thrombotic thrombocytopenic purpura)    Hypokalemia    Lichen sclerosus et atrophicus of the vulva 11/30/2015   Major depressive disorder, recurrent, moderate (HCC)    Methicillin susceptible Staphylococcus aureus infection 03/09/2011   Overview:  Last Assessment & Plan:  This is undoubtedlythe culprit organism   Migraine    Morbid obesity with BMI of 45.0-49.9, adult (HCC) 10/13/2015   Morbidly obese (HCC)    MSSA (methicillin susceptible Staphylococcus aureus) infection    Near syncope    Osteoarthritis, chronic    Pain  due  to total hip replacement (HCC) 09/20/2011   Palpitations 03/04/2019   Postmenopausal    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 syndrome (HCC)    20 yrs ago-not seen anyone for 10 yrs.   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 Mild diverticulosis. Small internal hemorrhoids. Healed anal fissure   ENDOVENOUS ABLATION SAPHENOUS VEIN W/ LASER Right 04/02/2019   endovenous laser ablation right greater saphenous vein by Waverly Ferrari MD    JOINT REPLACEMENT  08/2009   right hip replacement   REVISION TOTAL HIP ARTHROPLASTY     TONSILLECTOMY  1963   TOTAL HIP REVISION  10/08/2011   Procedure: TOTAL HIP REVISION;  Surgeon: [name];  Location: [facility];  Service: Orthopedics;  Laterality: Right;  Resection of Right Total Hip/Extended Trochanteric Osteotomy/Placement of Antibiotic Total Hip Cemented by Depuy   TOTAL HIP REVISION  03/10/2012   Procedure: TOTAL HIP REVISION;  Surgeon: [name], MD;  Location: [facility];  Service: Orthopedics;  Laterality: Right;  Reimplantation/Revision of a Right Total Hip and Removal of Cemented Implant    SOCIAL HISTORY: Social History   Socioeconomic History   Marital status: Married    Spouse name: Raiford Noble   Number of children: Not on file   Years of education: 12+7   Highest education level: Bachelor's degree (e.g., BA, AB, BS)  Occupational History   Occupation: Magazine features editor: [employer]  Tobacco Use   Smoking status: Former    Current packs/day: 0.00    Average packs/day: 1.5 packs/day for 20.0 years (30.0 ttl pk-yrs)    Types: Cigarettes    Start date: 10/04/1970    Quit date: 10/04/1990    Years since quitting: 32.6   Smokeless tobacco: Former    Quit date: 03/09/1991  Vaping Use    Vaping status: Never Used  Substance and Sexual Activity   Alcohol use: Yes    Comment: OCCASIONAL   Drug use: No   Sexual activity: Yes    Partners: Male  Other Topics Concern   Not on file  Social History Narrative   Not on file   Social Determinants of Health   Financial Resource Strain: Not on file  Food Insecurity: Low Risk  (02/04/2023)   [facility]   Food vital sign    Within the past 12 months, you worried that your food would run out before you got money to buy more: Never true    Within the past 12 months, the food you bought just didn't last and you didn't have money to get more. : Never true  Transportation Needs: Not on file (02/04/2023)  Physical Activity: Not on file  Stress: Not on file  Social Connections: Not on file  Intimate Partner Violence: Not on file    FAMILY HISTORY Family History  Problem Relation Age of Onset   Hypertension Mother    Hypertension Father    Alzheimer's disease Father    Prostate cancer Father    Hypertension Maternal Grandfather    Seizures Other    Colon cancer Neg Hx    Stomach cancer Neg Hx    Rectal cancer Neg Hx    Esophageal cancer Neg Hx     ALLERGIES:  has  No Known Allergies.  MEDICATIONS:  Current Outpatient Medications  Medication Sig Dispense Refill   aluminum-magnesium hydroxide 200-200 MG/5ML suspension Take 15 mLs by mouth every 6 (six) hours as needed for indigestion.     buPROPion (WELLBUTRIN XL) 150 MG 24 hr tablet Take 150 mg by mouth every evening.     capecitabine (XELODA) 500 MG tablet Take 3 tablets (1,500 mg total) by mouth 2 (two) times daily after a meal. Take 14 days on, then 7 days off, repeat every 21 days. 180 tablet 3   clobetasol cream (TEMOVATE) 0.05 % Apply 1 Application topically as needed.     clotrimazole-betamethasone (LOTRISONE) cream Apply 1 Application topically 2 (two) times daily.     cyclobenzaprine (FLEXERIL) 5 MG tablet Take 5-10 mg by mouth at  bedtime.     diphenoxylate-atropine (LOMOTIL) 2.5-0.025 MG tablet Take 1 tablet by mouth 4 (four) times daily as needed for diarrhea or loose stools. 120 tablet 1   divalproex (DEPAKOTE) 250 MG DR tablet Take 250 mg by mouth 2 (two) times daily.     DULoxetine (CYMBALTA) 60 MG capsule Take 60 mg by mouth at bedtime.      esomeprazole (NEXIUM) 40 MG capsule Take 40 mg by mouth daily. Pt states has been on for years     ibuprofen (ADVIL) 200 MG tablet Take 200 mg by mouth every 6 (six) hours as needed.     levofloxacin (LEVAQUIN) 500 MG tablet Take 1 tablet (500 mg total) by mouth daily. 10 tablet 0   magic mouthwash (lidocaine, diphenhydrAMINE, alum & mag hydroxide) suspension Swish and swallow 5 mLs every 4 (four) hours as needed for mouth pain. 240 mL 0   magic mouthwash (lidocaine, diphenhydrAMINE, alum & mag hydroxide) suspension Swish and swallow 5 mLs every 4 (four) hours as needed for mouth pain. 240 mL 0   meloxicam (MOBIC) 7.5 MG tablet Take 7.5 mg by mouth every morning.     metroNIDAZOLE (FLAGYL) 500 MG tablet Take 1 tablet (500 mg total) by mouth 3 (three) times daily. 30 tablet 0   Multiple Vitamin (MULTIVITAMIN WITH MINERALS) TABS tablet Take 1 tablet by mouth daily.     nortriptyline (PAMELOR) 10 MG capsule Take 10 mg by mouth.     ondansetron (ZOFRAN) 8 MG tablet Take 1 tablet (8 mg total) by mouth 4 (four) times daily as needed for nausea or vomiting. 120 tablet 0   oxyCODONE (OXY IR/ROXICODONE) 5 MG immediate release tablet SMARTSIG:5 Milligram(s) By Mouth Every 4 Hours PRN     potassium chloride SA (KLOR-CON M) 20 MEQ tablet Take 3 tablet today, then start 1 tablet twice daily 64 tablet 0   predniSONE (DELTASONE) 20 MG tablet Take 20 mg by mouth 2 (two) times daily.     prochlorperazine (COMPAZINE) 10 MG tablet Take 1 tablet (10 mg total) by mouth every 6 (six) hours as needed for nausea or vomiting. 30 tablet 1   SUMAtriptan (IMITREX) 25 MG tablet Take 25 mg by mouth.      AMBULATORY NON FORMULARY MEDICATION Medication Name: Magic Mouth Wash 3 parts Mylanta/Maalox 2 parts Benadryl 1 part Viscous Lidocaine  Swish and Swallow 5 mL every 3-4 hours as needed (Patient not taking: Reported on 05/30/2023) 240 mL 0   penicillin v potassium (VEETID) 500 MG tablet Take 1 tablet (500 mg total) by mouth 2 (two) times daily. 180 tablet 3   Current Facility-Administered Medications  Medication Dose Route Frequency Provider Last Rate Last Admin  triamcinolone acetonide (KENALOG-40) injection 20 mg  20 mg Other Once Asencion Islam, DPM        PHYSICAL EXAMINATION:  ECOG PERFORMANCE STATUS: 1 - Symptomatic but completely ambulatory   Vitals:   05/30/23 0909  BP: 116/63  Pulse: 96  Resp: 18  Temp: 97.6 F (36.4 C)  SpO2: 98%      Filed Weights   05/30/23 0909  Weight: 220 lb 1.6 oz (99.8 kg)       Physical Exam Vitals and nursing note reviewed.  Constitutional:      General: She is not in acute distress.    Appearance: She is obese. She is not ill-appearing, toxic-appearing or diaphoretic.     Comments: Here with husband.  Ill appearing  HENT:     Head: Normocephalic and atraumatic.     Right Ear: External ear normal.     Left Ear: External ear normal.     Nose: Nose normal. No congestion or rhinorrhea.     Mouth/Throat:     Mouth: Mucous membranes are dry.     Pharynx: No oropharyngeal exudate or posterior oropharyngeal erythema.  Eyes:     General: No scleral icterus.    Extraocular Movements: Extraocular movements intact.     Conjunctiva/sclera: Conjunctivae normal.     Pupils: Pupils are equal, round, and reactive to light.  Cardiovascular:     Rate and Rhythm: Normal rate.     Heart sounds: No murmur heard.    No friction rub. No gallop.  Abdominal:     General: Bowel sounds are normal.     Palpations: Abdomen is soft.     Tenderness: There is no abdominal tenderness. There is no guarding or rebound.  Musculoskeletal:         General: No swelling, tenderness or deformity.     Cervical back: Normal range of motion and neck supple. No rigidity or tenderness.     Right lower leg: No edema.     Left lower leg: No edema.  Lymphadenopathy:     Head:     Right side of head: No submental, submandibular, tonsillar, preauricular, posterior auricular or occipital adenopathy.     Left side of head: No submental, submandibular, tonsillar, preauricular, posterior auricular or occipital adenopathy.     Cervical: No cervical adenopathy.     Right cervical: No superficial, deep or posterior cervical adenopathy.    Left cervical: No superficial, deep or posterior cervical adenopathy.     Upper Body:     Right upper body: No supraclavicular, axillary, pectoral or epitrochlear adenopathy.     Left upper body: No supraclavicular, axillary, pectoral or epitrochlear adenopathy.  Skin:    General: Skin is warm and dry.     Coloration: Skin is not jaundiced.  Neurological:     General: No focal deficit present.     Mental Status: She is alert and oriented to person, place, and time.     Cranial Nerves: No cranial nerve deficit.     Comments: Seated in wheelchair.  Falls asleep during interview but arousable  Psychiatric:     Comments: Forgetful.  Talkative.  Insight not good      LABORATORY DATA: I have personally reviewed the data as listed:  Appointment on 05/29/2023  Component Date Value Ref Range Status   LDH 05/29/2023 255 (H)  98 - 192 U/L Final   Performed at [facility], [address], [place], [place] [address]   WBC Count 05/29/2023  2.3 (L)  4.0 - 10.5 K/uL Final   RBC 05/29/2023 3.07 (L)  3.87 - 5.11 MIL/uL Final   Hemoglobin 05/29/2023 10.1 (L)  12.0 - 15.0 g/dL Final   HCT 42/59/5638 30.4 (L)  36.0 - 46.0 % Final   MCV 05/29/2023 99.0  80.0 - 100.0 fL Final   MCH 05/29/2023 32.9  26.0 - 34.0 pg Final   MCHC 05/29/2023 33.2  30.0 - 36.0 g/dL Final   RDW 75/64/3329 22.2 (H)  11.5 - 15.5 %  Final   Platelet Count 05/29/2023 84 (L)  150 - 400 K/uL Final   Comment: SPECIMEN CHECKED FOR CLOTS Immature Platelet Fraction may be clinically indicated, consider ordering this additional test [id number] REPEATED TO VERIFY PLATELET COUNT CONFIRMED BY SMEAR    nRBC 05/29/2023 0.0  0.0 - 0.2 % Final   Neutrophils Relative % 05/29/2023 66  % Final   Neutro Abs 05/29/2023 1.5 (L)  1.7 - 7.7 K/uL Final   Lymphocytes Relative 05/29/2023 18  % Final   Lymphs Abs 05/29/2023 0.4 (L)  0.7 - 4.0 K/uL Final   Monocytes Relative 05/29/2023 13  % Final   Monocytes Absolute 05/29/2023 0.3  0.1 - 1.0 K/uL Final   Eosinophils Relative 05/29/2023 0  % Final   Eosinophils Absolute 05/29/2023 0.0  0.0 - 0.5 K/uL Final   Basophils Relative 05/29/2023 1  % Final   Basophils Absolute 05/29/2023 0.0  0.0 - 0.1 K/uL Final   WBC Morphology 05/29/2023 TOXIC GRANULATION   Final   Smear Review 05/29/2023 MORPHOLOGY UNREMARKABLE   Final   Immature Granulocytes 05/29/2023 2  % Final   Abs Immature Granulocytes 05/29/2023 0.04  0.00 - 0.07 K/uL Final   Ovalocytes 05/29/2023 PRESENT   Final   Performed at [facility], [address] [place], [place] [address]   Haptoglobin 05/29/2023 <10 (L)  37 - 355 mg/dL Final   Comment: (NOTE) Performed At: [facility] [address] [place], [place] [id number] Jolene Schimke MD TD:[phone]    DAT, complement 05/29/2023 NEG   Final   DAT, IgG 05/29/2023    Final                   Value:NEG Performed at [facility], [address], [place], [place] [address]   Abstract on 05/21/2023  Component Date Value Ref Range Status   Glucose 05/21/2023 96   Final   BUN 05/21/2023 15  4 - 21 Final   CO2 05/21/2023 27 (A)  13 - 22 Final   Creatinine 05/21/2023 0.9  0.5 - 1.1 Final   Potassium 05/21/2023 3.4 (A)  3.5 - 5.1 mEq/L Final   Sodium 05/21/2023 135 (A)  137 - 147 Final   Chloride 05/21/2023 100  99 - 108 Final    Calcium 05/21/2023 9.5  8.7 - 10.7 Final   Albumin 05/21/2023 3.8  3.5 - 5.0 Final   Alkaline Phosphatase 05/21/2023 76  25 - 125 Final   ALT 05/21/2023 17  7 - 35 U/L Final   AST 05/21/2023 38 (A)  13 - 35 Final   Bilirubin, Total 05/21/2023 1.1   Final   Magnesium 05/21/2023 1.70   Final  Abstract on 05/21/2023  Component Date Value Ref Range Status   Hemoglobin 05/21/2023 10.4 (A)  12.0 - 16.0 Final   HCT 05/21/2023 30 (A)  36 - 46 Final   Neutrophils Absolute 05/21/2023 1.75   Final   Platelets 05/21/2023 50 (  A)  150 - 400 K/uL Final   WBC 05/21/2023 3.8   Final   RBC 05/21/2023 3.28 (A)  3.87 - 5.11 Final  Appointment on 05/21/2023  Component Date Value Ref Range Status   CBC with Differential 05/21/2023 Sent to [facility] for testing. See scanned report.   Final   Performed at [facility], [address] [place], [place] [address]   Comprehensive Metabolic Panel 05/21/2023 Sent to [facility] for testing. See scanned report.   Final   Performed at [facility], [address], [place], [place] [address]  Abstract on 05/13/2023  Component Date Value Ref Range Status   Hemoglobin 05/13/2023 10.8 (A)  12.0 - 16.0 Final   HCT 05/13/2023 31 (A)  36 - 46 Final   Neutrophils Absolute 05/13/2023 2.84   Final   Platelets 05/13/2023 91 (A)  150 - 400 K/uL Final   WBC 05/13/2023 3.5   Final   RBC 05/13/2023 3.43 (A)  3.87 - 5.11 Final   Glucose 05/13/2023 148   Final   BUN 05/13/2023 9  4 - 21 Final   CO2 05/13/2023 29 (A)  13 - 22 Final   Creatinine 05/13/2023 0.6  0.5 - 1.1 Final   Potassium 05/13/2023 3.3 (A)  3.5 - 5.1 mEq/L Final   Sodium 05/13/2023 137  137 - 147 Final   Chloride 05/13/2023 102  99 - 108 Final   Calcium 05/13/2023 9.1  8.7 - 10.7 Final   Albumin 05/13/2023 3.6  3.5 - 5.0 Final   Alkaline Phosphatase 05/13/2023 82  25 - 125 Final   ALT 05/13/2023 18  7 - 35 U/L Final   AST 05/13/2023 38 (A)  13 - 35 Final    Bilirubin, Total 05/13/2023 1.1   Final   Magnesium 05/13/2023 1.90   Final  Appointment on 05/09/2023  Component Date Value Ref Range Status   Potassium 05/09/2023 3.0 (L)  3.5 - 5.1 mmol/L Final   Performed at [facility], [address], [place], [place] [address]   Magnesium 05/09/2023 1.7  1.7 - 2.4 mg/dL Final   Performed at [facility], [address], [place], [place] [address]  Appointment on 05/08/2023  Component Date Value Ref Range Status   Magnesium 05/08/2023 1.9  1.7 - 2.4 mg/dL Final   Performed at [facility], [address], [place], [place] [address]  Office Visit on 05/08/2023  Component Date Value Ref Range Status   WBC 05/08/2023 3.3 (L)  4.0 - 10.5 K/uL Final   RBC 05/08/2023 3.26 (L)  3.87 - 5.11 MIL/uL Final   Hemoglobin 05/08/2023 10.2 (L)  12.0 - 15.0 g/dL Final   HCT 13/05/6577 29.7 (L)  36.0 - 46.0 % Final   MCV 05/08/2023 91.1  80.0 - 100.0 fL Final   MCH 05/08/2023 31.3  26.0 - 34.0 pg Final   MCHC 05/08/2023 34.3  30.0 - 36.0 g/dL Final   RDW 46/96/2952 17.4 (H)  11.5 - 15.5 % Final   Platelets 05/08/2023 89 (L)  150 - 400 K/uL Final   Comment: SPECIMEN CHECKED FOR CLOTS Immature Platelet Fraction may be clinically indicated, consider ordering this additional test [id number] REPEATED TO VERIFY PLATELET COUNT CONFIRMED BY SMEAR    nRBC 05/08/2023 0.0  0.0 - 0.2 % Final   Neutrophils Relative % 05/08/2023 52  % Final   Neutro Abs 05/08/2023 1.7  1.7 - 7.7 K/uL Final  Lymphocytes Relative 05/08/2023 21  % Final   Lymphs Abs 05/08/2023 0.7  0.7 - 4.0 K/uL Final   Monocytes Relative 05/08/2023 23  % Final   Monocytes Absolute 05/08/2023 0.8  0.1 - 1.0 K/uL Final   Eosinophils Relative 05/08/2023 4  % Final   Eosinophils Absolute 05/08/2023 0.1  0.0 - 0.5 K/uL Final   Basophils Relative 05/08/2023 0  % Final   Basophils Absolute 05/08/2023 0.0  0.0 - 0.1 K/uL Final   Immature Granulocytes  05/08/2023 0  % Final   Abs Immature Granulocytes 05/08/2023 0.01  0.00 - 0.07 K/uL Final   Performed at [facility], [address], [place], [place] [address]   Sodium 05/08/2023 138  135 - 145 mmol/L Final   Potassium 05/08/2023 2.5 (LL)  3.5 - 5.1 mmol/L Final   Comment: CRITICAL RESULT CALLED TO, READ BACK BY AND VERIFIED WITH DUNLAP, K RN @ 1432 ON 05/08/2023 BY ABDULHALIM,M    Chloride 05/08/2023 102  98 - 111 mmol/L Final   CO2 05/08/2023 26  22 - 32 mmol/L Final   Glucose, Bld 05/08/2023 100 (H)  70 - 99 mg/dL Final   Glucose reference range applies only to samples taken after fasting for at least 8 hours.   BUN 05/08/2023 12  8 - 23 mg/dL Final   Creatinine, Ser 05/08/2023 0.86  0.44 - 1.00 mg/dL Final   Calcium 09/81/1914 8.8 (L)  8.9 - 10.3 mg/dL Final   Total Protein 78/29/5621 6.1 (L)  6.5 - 8.1 g/dL Final   Albumin 30/86/5784 3.8  3.5 - 5.0 g/dL Final   AST 69/62/9528 29  15 - 41 U/L Final   ALT 05/08/2023 20  0 - 44 U/L Final   Alkaline Phosphatase 05/08/2023 62  38 - 126 U/L Final   Total Bilirubin 05/08/2023 1.3 (H)  0.3 - 1.2 mg/dL Final   GFR, Estimated 05/08/2023 >60  >60 mL/min Final   Comment: (NOTE) Calculated using the CKD-EPI Creatinine Equation (2021)    Anion gap 05/08/2023 10  5 - 15 Final   Performed at [facility], [address], [place], [place] [address]  Appointment on 04/30/2023  Component Date Value Ref Range Status   Sodium 04/30/2023 138  135 - 145 mmol/L Final   Potassium 04/30/2023 3.4 (L)  3.5 - 5.1 mmol/L Final   Chloride 04/30/2023 104  98 - 111 mmol/L Final   CO2 04/30/2023 23  22 - 32 mmol/L Final   Glucose, Bld 04/30/2023 103 (H)  70 - 99 mg/dL Final   Glucose reference range applies only to samples taken after fasting for at least 8 hours.   BUN 04/30/2023 19  8 - 23 mg/dL Final   Creatinine, Ser 04/30/2023 0.92  0.44 - 1.00 mg/dL Final   Calcium 40/07/2724 9.1  8.9 - 10.3 mg/dL Final   Total  Protein 04/30/2023 6.9  6.5 - 8.1 g/dL Final   Albumin 36/64/4034 4.1  3.5 - 5.0 g/dL Final   AST 74/25/9563 32  15 - 41 U/L Final   ALT 04/30/2023 21  0 - 44 U/L Final   Alkaline Phosphatase 04/30/2023 63  38 - 126 U/L Final   Total Bilirubin 04/30/2023 1.1  0.3 - 1.2 mg/dL Final   GFR, Estimated 04/30/2023 >60  >60 mL/min Final   Comment: (NOTE) Calculated using the CKD-EPI Creatinine Equation (2021)    Anion gap 04/30/2023 11  5 - 15 Final   Performed at [facility], [address] [place], [place] [address]   WBC 04/30/2023 4.7  4.0 - 10.5 K/uL Final   RBC 04/30/2023 3.79 (L)  3.87 - 5.11 MIL/uL Final   Hemoglobin 04/30/2023 11.6 (L)  12.0 - 15.0 g/dL Final   HCT 60/45/4098 34.1 (L)  36.0 - 46.0 % Final   MCV 04/30/2023 90.0  80.0 - 100.0 fL Final   MCH 04/30/2023 30.6  26.0 - 34.0 pg Final   MCHC 04/30/2023 34.0  30.0 - 36.0 g/dL Final   RDW 11/91/4782 16.8 (H)  11.5 - 15.5 % Final   Platelets 04/30/2023 61 (L)  150 - 400 K/uL Final   Comment: Immature Platelet Fraction may be clinically indicated, consider ordering this additional test [id number] REPEATED TO VERIFY PLATELET COUNT CONFIRMED BY SMEAR    nRBC 04/30/2023 0.0  0.0 - 0.2 % Final   Neutrophils Relative % 04/30/2023 60  % Final   Neutro Abs 04/30/2023 2.8  1.7 - 7.7 K/uL Final   Lymphocytes Relative 04/30/2023 23  % Final   Lymphs Abs 04/30/2023 1.1  0.7 - 4.0 K/uL Final   Monocytes Relative 04/30/2023 13  % Final   Monocytes Absolute 04/30/2023 0.6  0.1 - 1.0 K/uL Final   Eosinophils Relative 04/30/2023 4  % Final   Eosinophils Absolute 04/30/2023 0.2  0.0 - 0.5 K/uL Final   Basophils Relative 04/30/2023 0  % Final   Basophils Absolute 04/30/2023 0.0  0.0 - 0.1 K/uL Final   Immature Granulocytes 04/30/2023 0  % Final   Abs Immature Granulocytes 04/30/2023 0.02  0.00 - 0.07 K/uL Final   Performed at [facility], [address], [place], [place] [address]   Magnesium  04/30/2023 1.9  1.7 - 2.4 mg/dL Final   Performed at [facility], [address], [place], [place] [address]    RADIOGRAPHIC STUDIES: I have personally reviewed the radiological images as listed and agree with the findings in the report  No results found.  ASSESSMENT/PLAN  67 y.o. female is here because of  colon cancer.  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 Mass was partially circumferential (involves < 1/3rd of the lumen circumference). 10 mm polyp in proximal ascending colon which was sessile.  Pathology ulcerated mass- invasive moderately differentiated adenocarcinoma arising within a tubular adenoma with high-grade dysplasia   January 15 2023- CEA 2.9 Chromogranin A 564  January 17 2023- CT CAP  Short segment asymmetric wall thickening of the transverse colon measuring 3.3 cm in length.   Small adjacent soft tissue nodule/lymph nodes adjacent to the colonic wall thickening measuring 6 mm, suspicious  for local nodal disease. Prominent/mildly enlarged right upper quadrant lymph nodes similar to January 26, 2019 and favored reactive.  No distant metastatic disease.  Hepatomegaly with nodular hepatic contour, suggestive of cirrhosis Cholelithiasis without findings of acute cholecystitis.   Incidental right thyroid nodule   January 23 2023- Chromogranin A 294  February 04 2023:  Transverse colectomy.  Pathology showed adenocarcinoma invading visceral peritoneum, Grade 3.  All of 30 lymph nodes negative for tumor.  MSI intact Feb 25 2023- On basis of spontaneous improvement  in chromogranin A will hold on  Dodatate PET.     Therapeutics-  Feb 25 2023- Reviewed NCC[facility] treatment guidelines with patient and husband.  They are agreeable to proceed with adjuvant CAPOX x 6 months.   Mar 11 2023- Cycle 1 XelOx Mar 20 2023- Tolerated cycle 1 well with only some mild cold neuropathy April 01 2023- Cycle 2 XelOx   April 22 2023: Cycle 3 XelOx.              April 30 2023- Experiencing mucositis, anorexia, nausea and diarrhea.    May 13 2023: Cycle 4 XelOX.  Dose reduced Xeloda from 2000 mg bid to 1500 mg bid due to GI symptoms.  This will be her last cycle of adjuvant therapy  May 23 2023- Stopping Xeloda due to side effects and referring for admission.  Contacted ED attending   Chemotherapy induced nausea and diarrhea             April 30 2023-  Instructed patient to do the following-- Take Ondansetron 8 mg, four times daily.  Take Imodium 4 mg every 4 hrs for diarrhea Use the magic mouthwash every 4 hours while awake.  Arranged for patient to receive IVF and antiemetics.  To follow up in 2 days to assess progress     May 02 2023- To complete this course of Xeloda on July 15th.  Should improve with the week off between then and beginning of Cycle 4.  Anticipate dose reduction in Xeloda with Cycle 4.  Sx under better control today than at last visit due to better use of supportive care May 08 2023-  Continues to have 5 to 6 bowel movements daily.  Using imodium 2 mg q 5 hrs.  Instructed patient to use imodium 4 mg po q 4 hrs PRN diarrhea Adding lomotil up to qid PRN.  Has required potassium replacement due to diarrheal losses May 21 2023- Continues to have significant chemotherapy induced diarrhea.  Not using supportive care medications to maximum potential. Provided written instructions to patient and husband again today.  Adding Levaquin 500 mg daily.  Sending stool for C diff.  Referring to infusion for IVF.  Chemistries sent to guide electrolyte replacement.  Follow up in 2 days.   Sent testing to evaluate for  DPD deficiency.  Discussed plans with clinical pharmacist.   May 23 2023- Diarrhea improved with use of scheduled imodium, lomotil, ondansetron and since starting Levaquin daily.  Remains dehydrated as judged by exam (mucosa and skin dry, tachycardic) May 30 2023- Diarrhea persists.  Starting empiric Levaquin/Flagyl for diverticulitis/gastroenteritis   Mental status changes  May 23 2023- Likely multifactorial- 1) psychotropic medications (welbutrin, cymbalta, nortryptiline, depakote)  2) supportive care medications (Flexeril, lomotil, imodium, ondansetron)   3) chemotherapy (Xeloda) 4) Dehydration 5) underlying liver disease  Recommended admission via ED for evaluation and management.  Considering use of uridine triacetate as antidote for possible 5 FU toxicity   May 30 2023- Continues with mental status changes.  DPD assay pending.  Patient was not treated with uridine triacetate because of difficulties in obtaining medication and b/c physicians at [facility] felt that she was past the time that medication would provide benefit  Anemia/Thrombocytopenia  May 30 2023-  Has been attributed to myelosuppression from chemotherapy.  Undetectable haptoglobin indicates that hemolysis may be a major component. Labs notable for negative  Coombs test making immune mediated hemolysis unlikely.  ADAMSTS 13 level although low does not meet diagnostic criteria for TTP.  Will reassess patient early next week  Diarrhea:             January 15 2023- Not explained by the colon cancer.  No evidence of inflammatory bowel disease by colonoscopy.    Chromogranin A 564  January 23 2023- Repeat Chromogranin A improved  Feb 25 2023- Diarrhea and chromogranin A improved therefore holding on Dotatate PET/CT    Poor venous access:    Mar 11 2023- Port placement   Headaches Mar 07 2023- Improved following surgery likely related to a component of general anesthesia.  To see neurology  Possible cirrhosis  January 17 2023- Nodular liver contour noted on CT CAP    Cancer Staging  Colon cancer [facility]) Staging form: Colon and Rectum, AJCC 8th Edition - Clinical stage from 02/25/2023: Stage IIB (cT4a, cN0, cM0) - Signed by Loni Muse, MD on 02/25/2023 Histopathologic type: Adenocarcinoma, NOS Stage prefix: Initial diagnosis Total positive nodes: 0 Total nodes examined: 30 Histologic grade (G): G3 Histologic grading system: 4 grade system    No problem-specific Assessment & Plan notes found for this encounter.    Orders Placed This Encounter  Procedures   C difficile quick screen w PCR reflex    Standing Status:   Future    Standing Expiration Date:   05/29/2024    40  minutes was spent in patient care.  This included time spent preparing to see the patient (e.g., review of tests), obtaining and/or reviewing separately obtained history, counseling and educating the patient/family/caregiver, ordering medications, tests, or procedures; documenting clinical information in the electronic or other health record, independently interpreting results and communicating results to the patient/family/caregiver as well as coordination of care.       All questions were answered. The patient knows to call the clinic with any problems, questions or concerns.  This note was electronically signed.    Loni Muse, MD  06/03/2023 1:11 PM

## 2023-05-31 NOTE — Progress Notes (Deleted)
Cancer Center Cancer Initial Visit:  Patient Care Team: Hurshel Party, NP as PCP - General (Internal Medicine) Loni Muse, MD as Consulting Physician (Internal Medicine)  CHIEF COMPLAINTS/PURPOSE OF CONSULTATION:  Oncology History  Colon cancer [facility])  01/15/2023 Initial Diagnosis   Colon cancer (HCC)   02/25/2023 Cancer Staging   Staging form: Colon and Rectum, AJCC 8th Edition - Clinical stage from 02/25/2023: Stage IIB (cT4a, cN0, cM0) - Signed by Loni Muse, MD on 02/25/2023 Histopathologic type: Adenocarcinoma, NOS Stage prefix: Initial diagnosis Total positive nodes: 0 Total nodes examined: 30 Histologic grade (G): G3 Histologic grading system: 4 grade system   03/11/2023 -  Chemotherapy   Patient is on Treatment Plan : COLORECTAL Xelox (Capeox)(130/850) q21d       HISTORY OF PRESENTING ILLNESS: [name] 67 y.o. female is here because of  colon cancer Medical history notable for septic arthritis of the hip, obesity, atypical chest pain, skin cancer, carpal tunnel syndrome, chronic vaginitis, chronic venous and sufficiency, TTP, syncope  January 10, 2023: Colonoscopy-ulcerated 2 cm nonobstructing small mass with heaped up margins found in distal transverse colon.  Mass was partially circumferential (involves less than one third of the lumen circumference) no bleeding present.  10 mm polyp in proximal ascending colon which was sessile.  Pathology-Ascending colon polyp was compatible with a sessile serrated adenoma without cytologic dysplasia Transverse colon polyp-invasive moderately differentiated adenocarcinoma arising within a tubular adenoma with high-grade dysplasia  January 15 2023: [facility] Medical Oncology Consult Patient reports that the colonoscopy was performed for surveillance; however, she was also having episodes of intermittent diarrhea x 2 to 3 months.  Stools were not bloody, nor was she passing mucus but they were soft.  In  association with these episodes she would also feel dizzy and note an odd smell, nausea.   To see Dr. Logan Bores from surgery on January 22 2023   Social:  Married.  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  Tobacco quit 24 years.  Rare glass of wine  [facility] Mother alive 2 epileptic, dementia Father died 31 Alzheimers, prostate cancer Brother alive 18 arthritis requiring joint replacement,   WBC 6.4 hemoglobin 12.5 MCV 88 platelet count 178 normal differential CMP normal CEA 2.9 chromogranin A 564  January 17 2023:  CT CAP Short segment asymmetric wall thickening of the transverse colon measuring 3.3 cm in length, compatible with reported history of  primary colonic neoplasm.  Small adjacent soft tissue nodule/lymph nodes adjacent to the colonic wall thickening measuring 6 mm, nonspecific are suspicious  for local nodal disease. Prominent/mildly enlarged right upper quadrant lymph nodes are similar dating back to January 26, 2019 and favored reactive.  No convincing evidence of distant metastatic disease within the chest, abdomen or pelvis.  Hepatomegaly with nodular hepatic contour, suggestive of cirrhosis Cholelithiasis without findings of acute cholecystitis.   Incidental right thyroid nodule measuring   January 18 2023:  Presented to [facility] ED with headaches and chest discomfort.  CT PA negative for PE.  CT head without contrast negative.    January 22 2023:  Scheduled follow up for colon cancer.  Reviewed results of labs and imaging with patient and husband. Will repeat chromgranin A and refer for PET/CT if chromogranin A still significantly elevated.    January 23, 2023: Chromogranin A 294  February 04 2023:  Transverse colectomy Pathology showed adenocarcinoma invading visceral peritoneum, Grade 3.  All of 30 lymph nodes were negative for tumor.  Surgical Stage (T4a, N0 M0)  MSI intact  Feb 25, 2023:   Reviewed results of surgical pathology with patient and husband.  Per NCCN guidelines adjuvant  options are CAPEOX, FOLFOX 6 months, Capecitabine or observation.  Discussed risks and benefits of each  Mar 07 2023:   Bifrontal headaches have returned.  These began in October 2023 but resolved after her colon surgery.  Has seen neurology and underwent MRI brain negative for CNS lesion.  EEG normal.   (Raises the question of what medications in perioperative period helped with the HA's.)  Recovering from a dog bite on dorsum of right hand.  Will receive Capecitabine today.    Mar 11 2023:  Port placement.  Cycle 1 XelOx   Mar 19 2023:  Neurology follow up.    Mar 20 2023:   Agree with Neurology assessment that neurocognitive testing should not be performed it at all, until well after chemotherapy has been completed.  Experienced some mild nausea helped by antiemetics.   Has occasional HA's.  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   No HFS.     April 01 2023:  Cycle 2 XelOx  April 17 2023:  Has lost 4 lbs.  Appetite diminished due to dysgeusia.  No metallic taste.  Edges of tongue get red and sore and painful.  Mouth feels dry despite drinking a lot of water and tea.  Not using anything for her mouth sores.   Fatigue increased.  Has cold neuropathy and cold induced pharyngospasm.      Begin Magic mouthwash for mucositis.     April 22 2023:  Cycle 3 XelOx.    April 30 2023:    Patient called office yesterday stating that she was anorectic, nauseated, having mouth sores and diarrhea.  Has lost 13 lbs.  Has difficulty remembering details which complicates history taking.  States that anorexia began after starting this round of chemotherapy.  "Food does not get past my tongue"; did not recognize this was nausea so did not begin taking antiemetics until 3 days ago.  Only taking antiemetics bid.  Mouth feels dry but not painful.  No emesis.  Began with diarrhea in the form of 5 to 6 stools per day but does not believe that she is taking imodium for it.  Dysgeusia  even to water.   Not using magic mouthwash regularly  May 02 2023:    Diarrhea improved with imodium.  Using ondansetron alternating with compazine; no emesis with improvement in nausea.  Eating limited by mucositis which she can't say is helped by magic mouthwash.  Not eating cold foods.  Has gained 2 lbs in the last two days.  To complete the course of Xeloda on May 06 2023.    WBC 4.7 hemoglobin 11.6 platelet count 61; 60 segs 23 lymphs 13 monos 4 eos CMP notable for potassium 3.4 glucose 103  May 08 2023:   Scheduled follow-up regarding colon cancer. Has lost 4 lbs since last visit owing to anorexia.   No mouth sores but has dysgeusia and mouth feels dry.  Using magic mouthwash.  Nauseated but without emesis.  Continues to have 5 to 6 bowel movements daily.  Using imodium 2 mg q 5 hrs.    Instructed patient to use imodium 4 mg po q 4 hrs PRN diarrhea Adding lomotil up to qid PRN Xeloda has not been shipped to patient.   Will dose reduce Xeloda from 2000 mg bid to  1500 mg bid.    May 13 2023:  Cycle 4 XelOX.  Received total of 1 liter IVF during that visit due to hypotension, potassium due to hypokalemia.  Patient was told to increase dose of supplemental potassium  May 21 2023:  Seen as a work in due to side effects from chemotherapy.  Has lost 11 lbs since last visit.  Has been experiencing liquid stools sometimes almost hourly.  No nausea, emesis, mouth sores. Abdominal cramping. No fevers.  Appetite decreased.   Taking imodium 2 mg bid.  Not certain if she is taking lomotil.   Received IVF, IV potassium in infusion center WBC 3.8 hemoglobin 10.4 platelet count 50 CMP notable for potassium 3.4 AST 38 albumin 3.8 magnesium 1.7 DPD 5-fluorouracil toxicity panel sent  May 22 2023:  Fell backwards on steps outside.  Struck right elbow and knee but not head.  Skin tear to right elbow.   May 23, 2023: Scheduled follow-up to monitor side effects from chemotherapy.  Anorectic but eating  some.  Has gained 1 lbs.  Today experienced flash HA and accompanied by chest pain which last 2 to 4 minutes.  Yesterday had three diarrheal stools.  Significant improvement since taking Ondansetron, Lomotil and Imodium on scheduled basis.  Spending most of time sleeping.  Lost balance twice today.  Will stop Xeloda and arrange for hospitalization.    May 30 2023:  Post hospital follow up.  Has gained 5 lbs due to edema acquired during hospitalization.  Did not bring walker today but had one yesterday. Still having watery four to five times daily.  Experiencing LLQ abdominal pain.  Anorectic.  Using imodium twice daily.  Husband not certain how many times she is using lomotil daily.  May be using ondansetron bid.  Memory not good but may be improving slightly.  Will start Levaquin/Flagyl for possible diverticulitis.    Review of Systems  Constitutional:  Positive for appetite change and unexpected weight change. Negative for chills, fatigue and fever.  HENT:   Negative for mouth sores, nosebleeds, sore throat, tinnitus and trouble swallowing.   Eyes:  Negative for eye problems and icterus.       Vision changes:  None  Respiratory:  Negative for chest tightness, hemoptysis, shortness of breath and wheezing.        One month of nonproductive cough.  No history of seasonable allergies  Cardiovascular:  Negative for chest pain and leg swelling.  Gastrointestinal:  Positive for diarrhea and nausea. Negative for constipation, rectal pain and vomiting.  Endocrine: Negative for hot flashes.       Cold intolerance:  none Heat intolerance:  none  Genitourinary:  Negative for bladder incontinence, difficulty urinating, dysuria, frequency, hematuria and nocturia.   Musculoskeletal:  Positive for gait problem. Negative for arthralgias, back pain, myalgias, neck pain and neck stiffness.  Skin:  Negative for itching, rash and wound.  Neurological:  Positive for dizziness, extremity weakness, gait problem and  headaches. Negative for numbness, seizures and speech difficulty.  Hematological:  Negative for adenopathy. Bruises/bleeds easily.  Psychiatric/Behavioral:  Positive for confusion. Negative for sleep disturbance and suicidal ideas. The patient is not nervous/anxious.     MEDICAL HISTORY: Past Medical History:  Diagnosis Date  . Abnormal glucose   . Achilles tendinitis of left lower extremity   . Acute suppurative arthritis due to bacteria (HCC) 03/09/2011   Overview:  Last Assessment & Plan:  Methicillin sensitive staphylococcal aureus septic hip. She clearly does need I&D of  the hip. I will have her continue the doxycycline for now but stop it 7 days prior to her surgery to maximize the yield on the cultures in the operating room though I be shocked if we don't find methicillin sensitive staph aureus again.. I agree with removing as much of the pros  . Anal fissure 11/30/2015  . Arthritis    right hip  . Atypical chest pain 04/09/2016  . Bilateral edema of lower extremity   . BMI 39.0-39.9,adult   . Cancer [facility])    SKIN CANCER  . Carpal tunnel syndrome 03/09/2011   Overview:  Last Assessment & Plan:  Clinically this seems to be carpal tunnel syndrome. Giving given her history of infection though we can keep in the back of our mind the idea that she will have a cervical spine problem but I think this is unlikely at this point in time medics carpal tunnel syndrome fits clinically this was a positive Tinel's sign however in for a brace for her.  . Chronic vaginitis   . Chronic venous insufficiency   . Depression   . Dyslipidemia   . Fatigue 10/13/2015  . Fibromyalgia   . GERD without esophagitis   . History of immune thrombocytopenia 03/09/2011  . History of operative procedure on hip 03/10/2012  . History of TTP (thrombotic thrombocytopenic purpura)   . Hypokalemia   . Lichen sclerosus et atrophicus of the vulva 11/30/2015  . Major depressive disorder, recurrent, moderate (HCC)   .  Methicillin susceptible Staphylococcus aureus infection 03/09/2011   Overview:  Last Assessment & Plan:  This is undoubtedlythe culprit organism  . Migraine   . Morbid obesity with BMI of 45.0-49.9, adult (HCC) 10/13/2015  . Morbidly obese (HCC)   . MSSA (methicillin susceptible Staphylococcus aureus) infection   . Near syncope   . Osteoarthritis, chronic   . Pain due to total hip replacement (HCC) 09/20/2011  . Palpitations 03/04/2019  . Postmenopausal   . 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 Snoring 11/22/2015  . T.T.P. syndrome (HCC)    20 yrs ago-not seen anyone for 10 yrs.  . Vitamin D deficiency   . 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    SURGICAL HISTORY: Past Surgical History:  Procedure Laterality Date  . COLONOSCOPY  03/01/2011   Mild melanosis coli. Mild diverticulosis. Small internal hemorrhoids. Healed anal fissure  . ENDOVENOUS ABLATION SAPHENOUS VEIN W/ LASER Right 04/02/2019   endovenous laser ablation right greater saphenous vein by Waverly Ferrari MD   . JOINT REPLACEMENT  08/2009   right hip replacement  . REVISION TOTAL HIP ARTHROPLASTY    . TONSILLECTOMY  1963  . TOTAL HIP REVISION  10/08/2011   Procedure: TOTAL HIP REVISION;  Surgeon: [name];  Location: [facility];  Service: Orthopedics;  Laterality: Right;  Resection of Right Total Hip/Extended Trochanteric Osteotomy/Placement of Antibiotic Total Hip Cemented by Depuy  . TOTAL HIP REVISION  03/10/2012   Procedure: TOTAL HIP REVISION;  Surgeon: [name], MD;  Location: [facility];  Service: Orthopedics;  Laterality: Right;  Reimplantation/Revision of a Right Total Hip and Removal of Cemented Implant    SOCIAL HISTORY: Social History   Socioeconomic History  . Marital status: Married    Spouse name: Raiford Noble  . Number of children: Not on file  . Years of education: 12+7  . Highest education level: Bachelor's degree (e.g., BA, AB, BS)   Occupational History  . Occupation: Runner, broadcasting/film/video  Employer: [employer]  Tobacco Use  . Smoking status: Former    Current packs/day: 0.00    Average packs/day: 1.5 packs/day for 20.0 years (30.0 ttl pk-yrs)    Types: Cigarettes    Start date: 10/04/1970    Quit date: 10/04/1990    Years since quitting: 32.6  . Smokeless tobacco: Former    Quit date: 03/09/1991  Vaping Use  . Vaping status: Never Used  Substance and Sexual Activity  . Alcohol use: Yes    Comment: OCCASIONAL  . Drug use: No  . Sexual activity: Yes    Partners: Male  Other Topics Concern  . Not on file  Social History Narrative  . Not on file   Social Determinants of Health   Financial Resource Strain: Not on file  Food Insecurity: Low Risk  (02/04/2023)   [facility]   Food vital sign   . Within the past 12 months, you worried that your food would run out before you got money to buy more: Never true   . Within the past 12 months, the food you bought just didn't last and you didn't have money to get more. : Never true  Transportation Needs: Not on file (02/04/2023)  Physical Activity: Not on file  Stress: Not on file  Social Connections: Not on file  Intimate Partner Violence: Not on file    FAMILY HISTORY Family History  Problem Relation Age of Onset  . Hypertension Mother   . Hypertension Father   . Alzheimer's disease Father   . Prostate cancer Father   . Hypertension Maternal Grandfather   . Seizures Other   . Colon cancer Neg Hx   . Stomach cancer Neg Hx   . Rectal cancer Neg Hx   . Esophageal cancer Neg Hx     ALLERGIES:  has No Known Allergies.  MEDICATIONS:  Current Outpatient Medications  Medication Sig Dispense Refill  . aluminum-magnesium hydroxide 200-200 MG/5ML suspension Take 15 mLs by mouth every 6 (six) hours as needed for indigestion.    . AMBULATORY NON FORMULARY MEDICATION Medication Name: Magic Mouth Wash 3 parts Mylanta/Maalox 2  parts Benadryl 1 part Viscous Lidocaine  Swish and Swallow 5 mL every 3-4 hours as needed (Patient not taking: Reported on 05/30/2023) 240 mL 0  . buPROPion (WELLBUTRIN XL) 150 MG 24 hr tablet Take 150 mg by mouth every evening.    . capecitabine (XELODA) 500 MG tablet Take 3 tablets (1,500 mg total) by mouth 2 (two) times daily after a meal. Take 14 days on, then 7 days off, repeat every 21 days. 180 tablet 3  . clobetasol cream (TEMOVATE) 0.05 % Apply 1 Application topically as needed.    . clotrimazole-betamethasone (LOTRISONE) cream Apply 1 Application topically 2 (two) times daily.    . cyclobenzaprine (FLEXERIL) 5 MG tablet Take 5-10 mg by mouth at bedtime.    . diphenoxylate-atropine (LOMOTIL) 2.5-0.025 MG tablet Take 1 tablet by mouth 4 (four) times daily as needed for diarrhea or loose stools. 120 tablet 1  . divalproex (DEPAKOTE) 250 MG DR tablet Take 250 mg by mouth 2 (two) times daily.    . DULoxetine (CYMBALTA) 60 MG capsule Take 60 mg by mouth at bedtime.     [name] Kitchen esomeprazole (NEXIUM) 40 MG capsule Take 40 mg by mouth daily. Pt states has been on for years    . ibuprofen (ADVIL) 200 MG tablet Take 200 mg by mouth every 6 (six) hours as  needed.    [name] Kitchen levofloxacin (LEVAQUIN) 500 MG tablet Take 1 tablet (500 mg total) by mouth daily. 10 tablet 0  . magic mouthwash (lidocaine, diphenhydrAMINE, alum & mag hydroxide) suspension Swish and swallow 5 mLs every 4 (four) hours as needed for mouth pain. 240 mL 0  . magic mouthwash (lidocaine, diphenhydrAMINE, alum & mag hydroxide) suspension Swish and swallow 5 mLs every 4 (four) hours as needed for mouth pain. 240 mL 0  . meloxicam (MOBIC) 7.5 MG tablet Take 7.5 mg by mouth every morning.    . metroNIDAZOLE (FLAGYL) 500 MG tablet Take 1 tablet (500 mg total) by mouth 3 (three) times daily. 30 tablet 0  . Multiple Vitamin (MULTIVITAMIN WITH MINERALS) TABS tablet Take 1 tablet by mouth daily.    . nortriptyline (PAMELOR) 10 MG capsule Take 10 mg by  mouth.    . ondansetron (ZOFRAN) 8 MG tablet Take 1 tablet (8 mg total) by mouth 4 (four) times daily as needed for nausea or vomiting. 120 tablet 0  . oxyCODONE (OXY IR/ROXICODONE) 5 MG immediate release tablet SMARTSIG:5 Milligram(s) By Mouth Every 4 Hours PRN    . potassium chloride SA (KLOR-CON M) 20 MEQ tablet Take 3 tablet today, then start 1 tablet twice daily 64 tablet 0  . predniSONE (DELTASONE) 20 MG tablet Take 20 mg by mouth 2 (two) times daily.    . prochlorperazine (COMPAZINE) 10 MG tablet Take 1 tablet (10 mg total) by mouth every 6 (six) hours as needed for nausea or vomiting. 30 tablet 1  . SUMAtriptan (IMITREX) 25 MG tablet Take 25 mg by mouth.     Current Facility-Administered Medications  Medication Dose Route Frequency Provider Last Rate Last Admin  . triamcinolone acetonide (KENALOG-40) injection 20 mg  20 mg Other Once Asencion Islam, DPM        PHYSICAL EXAMINATION:  ECOG PERFORMANCE STATUS: 1 - Symptomatic but completely ambulatory   There were no vitals filed for this visit.    There were no vitals filed for this visit.     Physical Exam Vitals and nursing note reviewed.  Constitutional:      General: She is not in acute distress.    Appearance: She is obese. She is not ill-appearing, toxic-appearing or diaphoretic.     Comments: Here with husband.  Ill appearing  HENT:     Head: Normocephalic and atraumatic.     Right Ear: External ear normal.     Left Ear: External ear normal.     Nose: Nose normal. No congestion or rhinorrhea.     Mouth/Throat:     Mouth: Mucous membranes are dry.     Pharynx: No oropharyngeal exudate or posterior oropharyngeal erythema.  Eyes:     General: No scleral icterus.    Extraocular Movements: Extraocular movements intact.     Conjunctiva/sclera: Conjunctivae normal.     Pupils: Pupils are equal, round, and reactive to light.  Cardiovascular:     Rate and Rhythm: Normal rate.     Heart sounds: No murmur heard.     No friction rub. No gallop.  Abdominal:     General: Bowel sounds are normal.     Palpations: Abdomen is soft.     Tenderness: There is no abdominal tenderness. There is no guarding or rebound.  Musculoskeletal:        General: No swelling, tenderness or deformity.     Cervical back: Normal range of motion and neck supple. No rigidity or tenderness.  Right lower leg: No edema.     Left lower leg: No edema.  Lymphadenopathy:     Head:     Right side of head: No submental, submandibular, tonsillar, preauricular, posterior auricular or occipital adenopathy.     Left side of head: No submental, submandibular, tonsillar, preauricular, posterior auricular or occipital adenopathy.     Cervical: No cervical adenopathy.     Right cervical: No superficial, deep or posterior cervical adenopathy.    Left cervical: No superficial, deep or posterior cervical adenopathy.     Upper Body:     Right upper body: No supraclavicular, axillary, pectoral or epitrochlear adenopathy.     Left upper body: No supraclavicular, axillary, pectoral or epitrochlear adenopathy.  Skin:    General: Skin is warm and dry.     Coloration: Skin is not jaundiced.  Neurological:     General: No focal deficit present.     Mental Status: She is alert and oriented to person, place, and time.     Cranial Nerves: No cranial nerve deficit.     Comments: Seated in wheelchair.  Falls asleep during interview but arousable  Psychiatric:     Comments: Forgetful.  Talkative.  Insight not good     LABORATORY DATA: I have personally reviewed the data as listed:  Appointment on 05/29/2023  Component Date Value Ref Range Status  . LDH 05/29/2023 255 (H)  98 - 192 U/L Final   Performed at [facility], [address] [place], [place] [address]  . WBC Count 05/29/2023 2.3 (L)  4.0 - 10.5 K/uL Final  . RBC 05/29/2023 3.07 (L)  3.87 - 5.11 MIL/uL Final  . Hemoglobin 05/29/2023 10.1 (L)  12.0 - 15.0 g/dL Final   . HCT 11/91/4782 30.4 (L)  36.0 - 46.0 % Final  . MCV 05/29/2023 99.0  80.0 - 100.0 fL Final  . MCH 05/29/2023 32.9  26.0 - 34.0 pg Final  . MCHC 05/29/2023 33.2  30.0 - 36.0 g/dL Final  . RDW 95/62/1308 22.2 (H)  11.5 - 15.5 % Final  . Platelet Count 05/29/2023 84 (L)  150 - 400 K/uL Final   Comment: SPECIMEN CHECKED FOR CLOTS Immature Platelet Fraction may be clinically indicated, consider ordering this additional test [id number] REPEATED TO VERIFY PLATELET COUNT CONFIRMED BY SMEAR   . nRBC 05/29/2023 0.0  0.0 - 0.2 % Final  . Neutrophils Relative % 05/29/2023 66  % Final  . Neutro Abs 05/29/2023 1.5 (L)  1.7 - 7.7 K/uL Final  . Lymphocytes Relative 05/29/2023 18  % Final  . Lymphs Abs 05/29/2023 0.4 (L)  0.7 - 4.0 K/uL Final  . Monocytes Relative 05/29/2023 13  % Final  . Monocytes Absolute 05/29/2023 0.3  0.1 - 1.0 K/uL Final  . Eosinophils Relative 05/29/2023 0  % Final  . Eosinophils Absolute 05/29/2023 0.0  0.0 - 0.5 K/uL Final  . Basophils Relative 05/29/2023 1  % Final  . Basophils Absolute 05/29/2023 0.0  0.0 - 0.1 K/uL Final  . WBC Morphology 05/29/2023 TOXIC GRANULATION   Final  . Smear Review 05/29/2023 MORPHOLOGY UNREMARKABLE   Final  . Immature Granulocytes 05/29/2023 2  % Final  . Abs Immature Granulocytes 05/29/2023 0.04  0.00 - 0.07 K/uL Final  . Ovalocytes 05/29/2023 PRESENT   Final   Performed at [facility], [address], [place], [place] [address]  . Haptoglobin 05/29/2023 <10 (L)  37 - 355 mg/dL Final   Comment: (NOTE) Performed  At: [facility] [address] [place], [place] [id number] Jolene Schimke MD WU:[id number]   . DAT, complement 05/29/2023 NEG   Final  . DAT, IgG 05/29/2023    Final                   Value:NEG Performed at [facility], [address], [place], [place] [address]   Abstract on 05/21/2023  Component Date Value Ref Range Status  . Glucose 05/21/2023 96   Final  . BUN 05/21/2023  15  4 - 21 Final  . CO2 05/21/2023 27 (A)  13 - 22 Final  . Creatinine 05/21/2023 0.9  0.5 - 1.1 Final  . Potassium 05/21/2023 3.4 (A)  3.5 - 5.1 mEq/L Final  . Sodium 05/21/2023 135 (A)  137 - 147 Final  . Chloride 05/21/2023 100  99 - 108 Final  . Calcium 05/21/2023 9.5  8.7 - 10.7 Final  . Albumin 05/21/2023 3.8  3.5 - 5.0 Final  . Alkaline Phosphatase 05/21/2023 76  25 - 125 Final  . ALT 05/21/2023 17  7 - 35 U/L Final  . AST 05/21/2023 38 (A)  13 - 35 Final  . Bilirubin, Total 05/21/2023 1.1   Final  . Magnesium 05/21/2023 1.70   Final  Abstract on 05/21/2023  Component Date Value Ref Range Status  . Hemoglobin 05/21/2023 10.4 (A)  12.0 - 16.0 Final  . HCT 05/21/2023 30 (A)  36 - 46 Final  . Neutrophils Absolute 05/21/2023 1.75   Final  . Platelets 05/21/2023 50 (A)  150 - 400 K/uL Final  . WBC 05/21/2023 3.8   Final  . RBC 05/21/2023 3.28 (A)  3.87 - 5.11 Final  Appointment on 05/21/2023  Component Date Value Ref Range Status  . CBC with Differential 05/21/2023 Sent to [facility] for testing. See scanned report.   Final   Performed at [facility], [address], [place], [place] [address]  . Comprehensive Metabolic Panel 05/21/2023 Sent to [facility] for testing. See scanned report.   Final   Performed at [facility], [address] [place], [place] [address]  Abstract on 05/13/2023  Component Date Value Ref Range Status  . Hemoglobin 05/13/2023 10.8 (A)  12.0 - 16.0 Final  . HCT 05/13/2023 31 (A)  36 - 46 Final  . Neutrophils Absolute 05/13/2023 2.84   Final  . Platelets 05/13/2023 91 (A)  150 - 400 K/uL Final  . WBC 05/13/2023 3.5   Final  . RBC 05/13/2023 3.43 (A)  3.87 - 5.11 Final  . Glucose 05/13/2023 148   Final  . BUN 05/13/2023 9  4 - 21 Final  . CO2 05/13/2023 29 (A)  13 - 22 Final  . Creatinine 05/13/2023 0.6  0.5 - 1.1 Final  . Potassium 05/13/2023 3.3 (A)  3.5 - 5.1 mEq/L Final  . Sodium 05/13/2023 137  137  - 147 Final  . Chloride 05/13/2023 102  99 - 108 Final  . Calcium 05/13/2023 9.1  8.7 - 10.7 Final  . Albumin 05/13/2023 3.6  3.5 - 5.0 Final  . Alkaline Phosphatase 05/13/2023 82  25 - 125 Final  . ALT 05/13/2023 18  7 - 35 U/L Final  . AST 05/13/2023 38 (A)  13 - 35 Final  . Bilirubin, Total 05/13/2023 1.1   Final  . Magnesium 05/13/2023 1.90   Final  Appointment on 05/09/2023  Component Date Value Ref Range Status  . Potassium 05/09/2023  3.0 (L)  3.5 - 5.1 mmol/L Final   Performed at [facility], [address], [place], [place] [address]  . Magnesium 05/09/2023 1.7  1.7 - 2.4 mg/dL Final   Performed at [facility], [address], [place], [place] [address]  Appointment on 05/08/2023  Component Date Value Ref Range Status  . Magnesium 05/08/2023 1.9  1.7 - 2.4 mg/dL Final   Performed at [facility], [address], [place], [place] [address]  Office Visit on 05/08/2023  Component Date Value Ref Range Status  . WBC 05/08/2023 3.3 (L)  4.0 - 10.5 K/uL Final  . RBC 05/08/2023 3.26 (L)  3.87 - 5.11 MIL/uL Final  . Hemoglobin 05/08/2023 10.2 (L)  12.0 - 15.0 g/dL Final  . HCT 91/47/8295 29.7 (L)  36.0 - 46.0 % Final  . MCV 05/08/2023 91.1  80.0 - 100.0 fL Final  . MCH 05/08/2023 31.3  26.0 - 34.0 pg Final  . MCHC 05/08/2023 34.3  30.0 - 36.0 g/dL Final  . RDW 62/13/0865 17.4 (H)  11.5 - 15.5 % Final  . Platelets 05/08/2023 89 (L)  150 - 400 K/uL Final   Comment: SPECIMEN CHECKED FOR CLOTS Immature Platelet Fraction may be clinically indicated, consider ordering this additional test [id number] REPEATED TO VERIFY PLATELET COUNT CONFIRMED BY SMEAR   . nRBC 05/08/2023 0.0  0.0 - 0.2 % Final  . Neutrophils Relative % 05/08/2023 52  % Final  . Neutro Abs 05/08/2023 1.7  1.7 - 7.7 K/uL Final  . Lymphocytes Relative 05/08/2023 21  % Final  . Lymphs Abs 05/08/2023 0.7  0.7 - 4.0 K/uL Final  . Monocytes Relative 05/08/2023 23   % Final  . Monocytes Absolute 05/08/2023 0.8  0.1 - 1.0 K/uL Final  . Eosinophils Relative 05/08/2023 4  % Final  . Eosinophils Absolute 05/08/2023 0.1  0.0 - 0.5 K/uL Final  . Basophils Relative 05/08/2023 0  % Final  . Basophils Absolute 05/08/2023 0.0  0.0 - 0.1 K/uL Final  . Immature Granulocytes 05/08/2023 0  % Final  . Abs Immature Granulocytes 05/08/2023 0.01  0.00 - 0.07 K/uL Final   Performed at [facility], [address] [place], [place] [address]  . Sodium 05/08/2023 138  135 - 145 mmol/L Final  . Potassium 05/08/2023 2.5 (LL)  3.5 - 5.1 mmol/L Final   Comment: CRITICAL RESULT CALLED TO, READ BACK BY AND VERIFIED WITH DUNLAP, K RN @ 1432 ON 05/08/2023 BY ABDULHALIM,M   . Chloride 05/08/2023 102  98 - 111 mmol/L Final  . CO2 05/08/2023 26  22 - 32 mmol/L Final  . Glucose, Bld 05/08/2023 100 (H)  70 - 99 mg/dL Final   Glucose reference range applies only to samples taken after fasting for at least 8 hours.  . BUN 05/08/2023 12  8 - 23 mg/dL Final  . Creatinine, Ser 05/08/2023 0.86  0.44 - 1.00 mg/dL Final  . Calcium 32/44/0102 8.8 (L)  8.9 - 10.3 mg/dL Final  . Total Protein 05/08/2023 6.1 (L)  6.5 - 8.1 g/dL Final  . Albumin 72/53/6644 3.8  3.5 - 5.0 g/dL Final  . AST 03/47/4259 29  15 - 41 U/L Final  . ALT 05/08/2023 20  0 - 44 U/L Final  . Alkaline Phosphatase 05/08/2023 62  38 - 126 U/L Final  . Total Bilirubin 05/08/2023 1.3 (H)  0.3 - 1.2 mg/dL Final  . GFR, Estimated 05/08/2023 >60  >60 mL/min  Final   Comment: (NOTE) Calculated using the CKD-EPI Creatinine Equation (2021)   . Anion gap 05/08/2023 10  5 - 15 Final   Performed at [facility], [address], [place], [place] [address]    RADIOGRAPHIC STUDIES: I have personally reviewed the radiological images as listed and agree with the findings in the report  No results found.  ASSESSMENT/PLAN  67 y.o. female is here because of  colon cancer.  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 Mass was partially circumferential (involves < 1/3rd of the lumen circumference). 10 mm polyp in proximal ascending colon which was sessile.  Pathology ulcerated mass- invasive moderately differentiated adenocarcinoma arising within a tubular adenoma with high-grade dysplasia   January 15 2023- CEA 2.9 Chromogranin A 564  January 17 2023- CT CAP  Short segment asymmetric wall thickening of the transverse colon measuring 3.3 cm in length.   Small adjacent soft tissue nodule/lymph nodes adjacent to the colonic wall thickening measuring 6 mm, suspicious  for local nodal disease. Prominent/mildly enlarged right upper quadrant lymph nodes similar to January 26, 2019 and favored reactive.  No distant metastatic disease.  Hepatomegaly with nodular hepatic contour, suggestive of cirrhosis Cholelithiasis without findings of acute cholecystitis.   Incidental right thyroid nodule   January 23 2023- Chromogranin A 294  February 04 2023:  Transverse colectomy.  Pathology showed adenocarcinoma invading visceral peritoneum, Grade 3.  All of 30 lymph nodes negative for tumor.  MSI intact Feb 25 2023- On basis of spontaneous improvement in chromogranin A will hold on Dodatate PET.     Therapeutics-  Feb 25 2023- Reviewed [facility] treatment guidelines with patient and husband.  They are agreeable to proceed with adjuvant CAPOX x 6 months.   Mar 11 2023- Cycle 1 XelOx Mar 20 2023- Tolerated cycle 1 well with only some mild cold neuropathy April 01 2023- Cycle 2 XelOx   April 22 2023: Cycle 3 XelOx.              April 30 2023- Experiencing mucositis,  anorexia, nausea and diarrhea.    May 13 2023: Cycle 4 XelOX.  Dose reduced Xeloda from 2000 mg bid to 1500 mg bid due to GI symptoms.  This will be her last cycle of adjuvant therapy  May 23 2023- Stopping Xeloda due to side effects and referring for admission.  Contacted ED attending   Chemotherapy induced nausea and diarrhea             April 30 2023-  Instructed patient to do the following-- Take Ondansetron 8 mg, four times daily.  Take Imodium 4 mg every 4 hrs for diarrhea Use the magic mouthwash every 4 hours while awake.  Arranged for patient to receive IVF and antiemetics.  To follow up in 2 days to assess progress     May 02 2023- To complete this course of Xeloda on July 15th.  Should improve with the week off between then and beginning of Cycle 4.  Anticipate dose reduction in Xeloda with Cycle 4.  Sx under better control today than at last visit due to better use of supportive care May 08 2023-  Continues to have 5 to 6 bowel movements daily.  Using imodium 2 mg q 5 hrs.  Instructed patient to use imodium 4 mg po q 4 hrs PRN diarrhea Adding lomotil up to qid PRN.  Has required potassium replacement due to diarrheal losses May 21 2023- Continues to have significant chemotherapy induced diarrhea.  Not using supportive care medications to maximum potential. Provided written instructions to patient and husband again today.  Adding Levaquin 500 mg daily.  Sending stool for C diff.  Referring to infusion for IVF.  Chemistries sent to guide electrolyte replacement.  Follow up in 2 days.   Sent testing to evaluate for DPD deficiency.  Discussed plans with clinical pharmacist.   May 23 2023- Diarrhea improved with use of scheduled imodium, lomotil, ondansetron and since starting Levaquin daily.  Remains dehydrated as judged by exam (mucosa and skin dry, tachycardic)  Mental status changes  May 23 2023- Likely multifactorial- 1) psychotropic medications (welbutrin, cymbalta, nortryptiline,  depakote)  2) supportive care medications (Flexeril, lomotil, imodium, ondansetron)  3) chemotherapy (Xeloda) 4) Dehydration 5) underlying liver disease  Recommended admission via ED for evaluation and management.  Considering use of uridine triacetate as antidote for possible 5 FU toxicity    Diarrhea:             January 15 2023- Not explained by the colon cancer.  No evidence of inflammatory bowel disease by colonoscopy.    Chromogranin A 564  January 23 2023- Repeat Chromogranin A improved  Feb 25 2023- Diarrhea and chromogranin A improved therefore holding on Dotatate PET/CT    Poor venous access:    Mar 11 2023- Port placement   Headaches Mar 07 2023- Improved following surgery likely related to a component of general anesthesia.  To see neurology  Possible cirrhosis  January 17 2023- Nodular liver contour noted on CT CAP    Cancer Staging  Colon cancer [facility]) Staging form: Colon and Rectum, AJCC 8th Edition - Clinical stage from 02/25/2023: Stage IIB (cT4a, cN0, cM0) - Signed by Loni Muse, MD on 02/25/2023 Histopathologic type: Adenocarcinoma, NOS Stage prefix: Initial diagnosis Total positive nodes: 0 Total nodes examined: 30 Histologic grade (G): G3 Histologic grading system: 4 grade system    No problem-specific Assessment & Plan notes found for this encounter.    No orders of the defined types were placed in this encounter.   40  minutes was spent in patient care.  This included time spent preparing to see the patient (e.g., review of tests), obtaining and/or reviewing separately obtained history, counseling and educating the patient/family/caregiver, ordering medications, tests, or procedures; documenting clinical information in the electronic or other health record, independently interpreting results and communicating results to the patient/family/caregiver as well as coordination of care.       All questions were answered. The patient knows to call the clinic  with any problems, questions or concerns.  This note was electronically signed.    Loni Muse, MD  05/31/2023 5:44 PM

## 2023-06-03 ENCOUNTER — Inpatient Hospital Stay: Payer: Medicare PPO | Admitting: Oncology

## 2023-06-03 ENCOUNTER — Encounter: Payer: Self-pay | Admitting: Oncology

## 2023-06-03 ENCOUNTER — Inpatient Hospital Stay: Payer: Medicare PPO

## 2023-06-03 ENCOUNTER — Other Ambulatory Visit: Payer: Self-pay | Admitting: Pharmacist

## 2023-06-03 VITALS — BP 111/64 | HR 94 | Temp 97.7°F | Resp 16 | Ht 65.9 in | Wt 218.4 lb

## 2023-06-03 DIAGNOSIS — Z79899 Other long term (current) drug therapy: Secondary | ICD-10-CM

## 2023-06-03 DIAGNOSIS — Z09 Encounter for follow-up examination after completed treatment for conditions other than malignant neoplasm: Secondary | ICD-10-CM

## 2023-06-03 DIAGNOSIS — D594 Other nonautoimmune hemolytic anemias: Secondary | ICD-10-CM | POA: Insufficient documentation

## 2023-06-03 DIAGNOSIS — E860 Dehydration: Secondary | ICD-10-CM

## 2023-06-03 DIAGNOSIS — K7460 Unspecified cirrhosis of liver: Secondary | ICD-10-CM

## 2023-06-03 DIAGNOSIS — M3119 Other thrombotic microangiopathy: Secondary | ICD-10-CM

## 2023-06-03 DIAGNOSIS — D649 Anemia, unspecified: Secondary | ICD-10-CM

## 2023-06-03 DIAGNOSIS — E876 Hypokalemia: Secondary | ICD-10-CM | POA: Diagnosis not present

## 2023-06-03 DIAGNOSIS — D5939 Other hemolytic-uremic syndrome: Secondary | ICD-10-CM

## 2023-06-03 DIAGNOSIS — C184 Malignant neoplasm of transverse colon: Secondary | ICD-10-CM

## 2023-06-03 DIAGNOSIS — M311 Thrombotic microangiopathy, unspecified: Secondary | ICD-10-CM

## 2023-06-03 DIAGNOSIS — F05 Delirium due to known physiological condition: Secondary | ICD-10-CM

## 2023-06-03 DIAGNOSIS — R112 Nausea with vomiting, unspecified: Secondary | ICD-10-CM | POA: Diagnosis not present

## 2023-06-03 DIAGNOSIS — Z5111 Encounter for antineoplastic chemotherapy: Secondary | ICD-10-CM | POA: Diagnosis not present

## 2023-06-03 DIAGNOSIS — D6959 Other secondary thrombocytopenia: Secondary | ICD-10-CM

## 2023-06-03 DIAGNOSIS — I872 Venous insufficiency (chronic) (peripheral): Secondary | ICD-10-CM | POA: Diagnosis not present

## 2023-06-03 DIAGNOSIS — R197 Diarrhea, unspecified: Secondary | ICD-10-CM | POA: Diagnosis not present

## 2023-06-03 DIAGNOSIS — R600 Localized edema: Secondary | ICD-10-CM | POA: Diagnosis not present

## 2023-06-03 DIAGNOSIS — Z862 Personal history of diseases of the blood and blood-forming organs and certain disorders involving the immune mechanism: Secondary | ICD-10-CM

## 2023-06-03 DIAGNOSIS — Z23 Encounter for immunization: Secondary | ICD-10-CM | POA: Diagnosis not present

## 2023-06-03 LAB — CBC WITH DIFFERENTIAL/PLATELET
Abs Immature Granulocytes: 0.04 10*3/uL (ref 0.00–0.07)
Basophils Absolute: 0 10*3/uL (ref 0.0–0.1)
Basophils Relative: 0 %
Eosinophils Absolute: 0 10*3/uL (ref 0.0–0.5)
Eosinophils Relative: 1 %
HCT: 30.4 % — ABNORMAL LOW (ref 36.0–46.0)
Hemoglobin: 9.7 g/dL — ABNORMAL LOW (ref 12.0–15.0)
Immature Granulocytes: 1 %
Lymphocytes Relative: 27 %
Lymphs Abs: 0.9 10*3/uL (ref 0.7–4.0)
MCH: 32.4 pg (ref 26.0–34.0)
MCHC: 31.9 g/dL (ref 30.0–36.0)
MCV: 101.7 fL — ABNORMAL HIGH (ref 80.0–100.0)
Monocytes Absolute: 0.8 10*3/uL (ref 0.1–1.0)
Monocytes Relative: 24 %
Neutro Abs: 1.5 10*3/uL — ABNORMAL LOW (ref 1.7–7.7)
Neutrophils Relative %: 47 %
Platelets: 75 10*3/uL — ABNORMAL LOW (ref 150–400)
RBC: 2.99 MIL/uL — ABNORMAL LOW (ref 3.87–5.11)
RDW: 21.6 % — ABNORMAL HIGH (ref 11.5–15.5)
WBC: 3.3 10*3/uL — ABNORMAL LOW (ref 4.0–10.5)
nRBC: 0 % (ref 0.0–0.2)

## 2023-06-03 LAB — RETICULOCYTES
Immature Retic Fract: 21.2 % — ABNORMAL HIGH (ref 2.3–15.9)
RBC.: 2.98 MIL/uL — ABNORMAL LOW (ref 3.87–5.11)
Retic Count, Absolute: 173.1 10*3/uL (ref 19.0–186.0)
Retic Ct Pct: 5.8 % — ABNORMAL HIGH (ref 0.4–3.1)

## 2023-06-03 LAB — HEPATITIS PANEL, ACUTE
HCV Ab: REACTIVE — AB
Hep A IgM: NONREACTIVE
Hep B C IgM: NONREACTIVE
Hepatitis B Surface Ag: NONREACTIVE

## 2023-06-03 MED ORDER — PENICILLIN V POTASSIUM 500 MG PO TABS: 500.0000 mg | ORAL_TABLET | Freq: Two times a day (BID) | ORAL | 3 refills | Status: DC

## 2023-06-03 NOTE — Progress Notes (Signed)
[facility] Cancer Initial Visit:  Patient Care Team: Hurshel Party, NP as PCP - General (Internal Medicine) Loni Muse, MD as Consulting Physician (Internal Medicine)  CHIEF COMPLAINTS/PURPOSE OF CONSULTATION:  Oncology History  Colon cancer [facility])  01/15/2023 Initial Diagnosis   Colon cancer (HCC)   02/25/2023 Cancer Staging   Staging form: Colon and Rectum, AJCC 8th Edition - Clinical stage from 02/25/2023: Stage IIB (cT4a, cN0, cM0) - Signed by Loni Muse, MD on 02/25/2023 Histopathologic type: Adenocarcinoma, NOS Stage prefix: Initial diagnosis Total positive nodes: 0 Total nodes examined: 30 Histologic grade (G): G3 Histologic grading system: 4 grade system   03/11/2023 - 05/13/2023 Chemotherapy   Patient is on Treatment Plan : COLORECTAL Xelox (Capeox)(130/850) q21d       HISTORY OF PRESENTING ILLNESS: [name] 67 y.o. female is here because of  colon cancer Medical history notable for septic arthritis of the hip, obesity, atypical chest pain, skin cancer, carpal tunnel syndrome, chronic vaginitis, chronic venous and sufficiency, TTP, syncope  January 10, 2023: Colonoscopy-ulcerated 2 cm nonobstructing small mass with heaped up margins found in distal transverse colon.  Mass was partially circumferential (involves less than one third of the lumen circumference) no bleeding present.  10 mm polyp in proximal ascending colon which was sessile.  Pathology-Ascending colon polyp was compatible with a sessile serrated adenoma without cytologic dysplasia Transverse colon polyp-invasive moderately differentiated adenocarcinoma arising within a tubular adenoma with high-grade dysplasia  January 15 2023: [facility] Medical Oncology Consult Patient reports that the colonoscopy was performed for surveillance; however, she was also having episodes of intermittent diarrhea x 2 to 3 months.  Stools were not bloody, nor was she passing mucus but they were soft.  In  association with these episodes she would also feel dizzy and note an odd smell, nausea.   To see Dr. Logan Bores from surgery on January 22 2023   Social:  Married.  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  Tobacco quit 24 years.  Rare glass of wine  [facility] Mother alive 43 epileptic, dementia Father died 14 Alzheimers, prostate cancer Brother alive 60 arthritis requiring joint replacement,   WBC 6.4 hemoglobin 12.5 MCV 88 platelet count 178 normal differential CMP normal CEA 2.9 chromogranin A 564  January 17 2023:  CT CAP Short segment asymmetric wall thickening of the transverse colon measuring 3.3 cm in length, compatible with reported history of  primary colonic neoplasm.  Small adjacent soft tissue nodule/lymph nodes adjacent to the colonic wall thickening measuring 6 mm, nonspecific are suspicious  for local nodal disease. Prominent/mildly enlarged right upper quadrant lymph nodes are similar dating back to January 26, 2019 and favored reactive.  No convincing evidence of distant metastatic disease within the chest, abdomen or pelvis.  Hepatomegaly with nodular hepatic contour, suggestive of cirrhosis Cholelithiasis without findings of acute cholecystitis.   Incidental right thyroid nodule measuring   January 18 2023:  Presented to [facility] ED with headaches and chest discomfort.  CT PA negative for PE.  CT head without contrast negative.    January 22 2023:  Scheduled follow up for colon cancer.  Reviewed results of labs and imaging with patient and husband. Will repeat chromgranin A and refer for PET/CT if chromogranin A still significantly elevated.    January 23, 2023: Chromogranin A 294  February 04 2023:  Transverse colectomy Pathology showed adenocarcinoma invading visceral peritoneum, Grade 3.  All of 30 lymph nodes were negative for tumor.  Surgical Stage (T4a, N0 M0)  MSI intact  Feb 25, 2023:   Reviewed results of surgical pathology with patient and husband.  Per NCCN guidelines adjuvant  options are CAPEOX, FOLFOX 6 months, Capecitabine or observation.  Discussed risks and benefits of each  Mar 07 2023:   Bifrontal headaches have returned.  These began in October 2023 but resolved after her colon surgery.  Has seen neurology and underwent MRI brain negative for CNS lesion.  EEG normal.   (Raises the question of what medications in perioperative period helped with the HA's.)  Recovering from a dog bite on dorsum of right hand.  Will receive Capecitabine today.    Mar 11 2023:  Port placement.  Cycle 1 XelOx   Mar 19 2023:  Neurology follow up.    Mar 20 2023:   Agree with Neurology assessment that neurocognitive testing should not be performed it at all, until well after chemotherapy has been completed.  Experienced some mild nausea helped by antiemetics.   Has occasional HA's.  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   No HFS.     April 01 2023:  Cycle 2 XelOx  April 17 2023:  Has lost 4 lbs.  Appetite diminished due to dysgeusia.  No metallic taste.  Edges of tongue get red and sore and painful.  Mouth feels dry despite drinking a lot of water and tea.  Not using anything for her mouth sores.   Fatigue increased.  Has cold neuropathy and cold induced pharyngospasm.      Begin Magic mouthwash for mucositis.     April 22 2023:  Cycle 3 XelOx.    April 30 2023:    Patient called office yesterday stating that she was anorectic, nauseated, having mouth sores and diarrhea.  Has lost 13 lbs.  Has difficulty remembering details which complicates history taking.  States that anorexia began after starting this round of chemotherapy.  "Food does not get past my tongue"; did not recognize this was nausea so did not begin taking antiemetics until 3 days ago.  Only taking antiemetics bid.  Mouth feels dry but not painful.  No emesis.  Began with diarrhea in the form of 5 to 6 stools per day but does not believe that she is taking imodium for it.  Dysgeusia  even to water.   Not using magic mouthwash regularly  May 02 2023:    Diarrhea improved with imodium.  Using ondansetron alternating with compazine; no emesis with improvement in nausea.  Eating limited by mucositis which she can't say is helped by magic mouthwash.  Not eating cold foods.  Has gained 2 lbs in the last two days.  To complete the course of Xeloda on May 06 2023.    WBC 4.7 hemoglobin 11.6 platelet count 61; 60 segs 23 lymphs 13 monos 4 eos CMP notable for potassium 3.4 glucose 103  May 08 2023:   Scheduled follow-up regarding colon cancer. Has lost 4 lbs since last visit owing to anorexia.   No mouth sores but has dysgeusia and mouth feels dry.  Using magic mouthwash.  Nauseated but without emesis.  Continues to have 5 to 6 bowel movements daily.  Using imodium 2 mg q 5 hrs.    Instructed patient to use imodium 4 mg po q 4 hrs PRN diarrhea Adding lomotil up to qid PRN Xeloda has not been shipped to patient.   Will dose reduce Xeloda from 2000 mg bid to  1500 mg bid.    May 13 2023:  Cycle 4 XelOX.  Received total of 1 liter IVF during that visit due to hypotension, potassium due to hypokalemia.  Patient was told to increase dose of supplemental potassium  May 21 2023:  Seen as a work in due to side effects from chemotherapy.  Has lost 11 lbs since last visit.  Has been experiencing liquid stools sometimes almost hourly.  No nausea, emesis, mouth sores. Abdominal cramping. No fevers.  Appetite decreased.   Taking imodium 2 mg bid.  Not certain if she is taking lomotil.   Received IVF, IV potassium in infusion center WBC 3.8 hemoglobin 10.4 platelet count 50 CMP notable for potassium 3.4 AST 38 albumin 3.8 magnesium 1.7 DPD 5-fluorouracil toxicity panel sent  May 22 2023:  Fell backwards on steps outside.  Struck right elbow and knee but not head.  Skin tear to right elbow.    May 23, 2023: Scheduled follow-up to monitor side effects from chemotherapy.  Anorectic but  eating some.  Has gained 1 lbs.  Today experienced flash HA and accompanied by chest pain which last 2 to 4 minutes.  Yesterday had three diarrheal stools.  Significant improvement since taking Ondansetron, Lomotil and Imodium on scheduled basis.  Spending most of time sleeping.  Lost balance twice today.  Stopped Xeloda and arranged for hospitalization at [facility].     August 1 through May 25 2023:  Admitted to [facility] on admission WBC 2.9 Hgb 11.9 PLT 28  ANC 1.91 INR 1.2 PTT 28.1 Potassium 3.2 Lactate 0.9  Procalcitonin < 0.05 Stool for C diff negative CT brain without contrast negative   Given lack of improvement in clinical picture and inability to obtain uridine triacetate, the antidote for 5FU toxicity patient was transferred to [facility].   May 25 2023 through May 28 2023:  Admission to [facility]   Potential concern for Xeloda toxicity at this time however will hold off on uridine triacetate therapy and get DPD enzyme deficiency labs to see if pt can tolerate Xeloda going forward with treatment given persistent diarrhea. A diagnosis of TTP was entertained but dismissed as "patient does not have fever and does not have kidney impairment on CMP. Suspicion for TTP as the primary etiology is low. Likely that her mental status and CBC derangements are more likely due to capecitabine use."    Labs were notable negative GI Pathogen panel which included Shiga Toxin-Producing E. coli, E. coli 0157, Enteropathogenic E. coli, Enteroaggregative E. coli, Enterotoxigenic E. coli, Shigella spp.,  WBC2.7 Hgb 8.4 PLT 37 Haptoglobin < 30 Retic 2.1  LDH 239 Cr 0.63  Alb 2.0 INR 1.2 D dimer 0.3  Fibrinogen 202  INR 1.1 U/A trace ketones ADAMS TS 13 was drawn but patient was discharged home before results known   May 30 2023:  Post hospital follow up.  Has gained 5 lbs due to edema acquired during hospitalization.  Did not bring walker today but had one yesterday. Still having watery  four to five times daily.  Experiencing LLQ abdominal pain.  Anorectic.  Using imodium twice daily.  Husband not certain how many times she is using lomotil daily.  May be using ondansetron bid.  Memory not good but may be improving slightly.  Will start Levaquin/Flagyl for possible diverticulitis.   Assay for DPD toxicity still in process Obtained labs evening prior to today's visit WBC 2.3 Hgb 10.1 PLT 84  LDH 255 DAT negative Haptoglobin <  10  DAT negative ADAMSTS13 activity  67%   June 03 2023:  Scheduled follow up.  Diarrhea has improved.  Still having nausea with emesis, anorexia.  Has bilateral LE edema.  Brain fog persists and has shaking.  TTP was diagnosed on Christmas 1989 which was proceeded by flu like symptoms lasting several weeks prior.  Patient notes that urine is dark orange  Review of Systems  Constitutional:  Positive for appetite change and unexpected weight change. Negative for chills, fatigue and fever.  HENT:   Negative for mouth sores, nosebleeds, sore throat, tinnitus and trouble swallowing.   Eyes:  Negative for eye problems and icterus.       Vision changes:  None  Respiratory:  Negative for chest tightness, hemoptysis, shortness of breath and wheezing.        One month of nonproductive cough.  No history of seasonable allergies  Cardiovascular:  Negative for chest pain and leg swelling.  Gastrointestinal:  Positive for diarrhea and nausea. Negative for constipation, rectal pain and vomiting.  Endocrine: Negative for hot flashes.       Cold intolerance:  none Heat intolerance:  none  Genitourinary:  Negative for bladder incontinence, difficulty urinating, dysuria, frequency, hematuria and nocturia.   Musculoskeletal:  Positive for gait problem. Negative for arthralgias, back pain, myalgias, neck pain and neck stiffness.  Skin:  Negative for itching, rash and wound.  Neurological:  Positive for dizziness, extremity weakness, gait problem and headaches. Negative  for numbness, seizures and speech difficulty.  Hematological:  Negative for adenopathy. Bruises/bleeds easily.  Psychiatric/Behavioral:  Positive for confusion. Negative for sleep disturbance and suicidal ideas. The patient is not nervous/anxious.     MEDICAL HISTORY: Past Medical History:  Diagnosis Date   Abnormal glucose    Achilles tendinitis of left lower extremity    Acute suppurative arthritis due to bacteria (HCC) 03/09/2011   Overview:  Last Assessment & Plan:  Methicillin sensitive staphylococcal aureus septic hip. She clearly does need I&D of the hip. I will have her continue the doxycycline for now but stop it 7 days prior to her surgery to maximize the yield on the cultures in the operating room though I be shocked if we don't find methicillin sensitive staph aureus again.. I agree with removing as much of the pros   Anal fissure 11/30/2015   Arthritis    right hip   Atypical chest pain 04/09/2016   Bilateral edema of lower extremity    BMI 39.0-39.9,adult    Cancer [facility])    SKIN CANCER   Carpal tunnel syndrome 03/09/2011   Overview:  Last Assessment & Plan:  Clinically this seems to be carpal tunnel syndrome. Giving given her history of infection though we can keep in the back of our mind the idea that she will have a cervical spine problem but I think this is unlikely at this point in time medics carpal tunnel syndrome fits clinically this was a positive Tinel's sign however in for a brace for her.   Chronic vaginitis    Chronic venous insufficiency    Depression    Dyslipidemia    Fatigue 10/13/2015   Fibromyalgia    GERD without esophagitis    History of immune thrombocytopenia 03/09/2011   History of operative procedure on hip 03/10/2012   History of TTP (thrombotic thrombocytopenic purpura)    Hypokalemia    Lichen sclerosus et atrophicus of the vulva 11/30/2015   Major depressive disorder, recurrent, moderate (HCC)    Methicillin  susceptible Staphylococcus  aureus infection 03/09/2011   Overview:  Last Assessment & Plan:  This is undoubtedlythe culprit organism   Migraine    Morbid obesity with BMI of 45.0-49.9, adult (HCC) 10/13/2015   Morbidly obese (HCC)    MSSA (methicillin susceptible Staphylococcus aureus) infection    Near syncope    Osteoarthritis, chronic    Pain due to total hip replacement (HCC) 09/20/2011   Palpitations 03/04/2019   Postmenopausal    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 syndrome (HCC)    20 yrs ago-not seen anyone for 10 yrs.   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 Mild diverticulosis. Small internal hemorrhoids. Healed anal fissure   ENDOVENOUS ABLATION SAPHENOUS VEIN W/ LASER Right 04/02/2019   endovenous laser ablation right greater saphenous vein by Waverly Ferrari MD    JOINT REPLACEMENT  08/2009   right hip replacement   REVISION TOTAL HIP ARTHROPLASTY     TONSILLECTOMY  1963   TOTAL HIP REVISION  10/08/2011   Procedure: TOTAL HIP REVISION;  Surgeon: [name];  Location: [facility];  Service: Orthopedics;  Laterality: Right;  Resection of Right Total Hip/Extended Trochanteric Osteotomy/Placement of Antibiotic Total Hip Cemented by Depuy   TOTAL HIP REVISION  03/10/2012   Procedure: TOTAL HIP REVISION;  Surgeon: [name], MD;  Location: [facility];  Service: Orthopedics;  Laterality: Right;  Reimplantation/Revision of a Right Total Hip and Removal of Cemented Implant    SOCIAL HISTORY: Social History   Socioeconomic History   Marital status: Married    Spouse name: Raiford Noble   Number of children: Not on file   Years of education: 12+7   Highest education level: Bachelor's degree (e.g., BA, AB, BS)  Occupational History   Occupation: Magazine features editor: [employer]  Tobacco Use   Smoking status: Former    Current packs/day: 0.00    Average packs/day: 1.5 packs/day for 20.0 years (30.0 ttl pk-yrs)    Types: Cigarettes    Start date: 10/04/1970    Quit date: 10/04/1990    Years since quitting: 32.6   Smokeless tobacco: Former    Quit date: 03/09/1991  Vaping Use   Vaping status: Never Used  Substance and Sexual Activity   Alcohol use: Yes    Comment: OCCASIONAL   Drug use: No   Sexual activity: Yes    Partners: Male  Other Topics Concern   Not on file  Social History Narrative   Not on file   Social Determinants of Health   Financial Resource Strain: Not on file  Food Insecurity: Low Risk  (02/04/2023)   [facility]   Food vital sign    Within the past 12 months, you worried that your food would run out before you got money to buy more: Never true    Within the past 12 months, the food you bought just didn't last and you didn't have money to get more. : Never true  Transportation Needs: Not on file (02/04/2023)  Physical Activity: Not on file  Stress: Not on file  Social Connections: Not on file  Intimate Partner Violence: Not on file    FAMILY HISTORY Family History  Problem Relation Age of Onset   Hypertension  Mother    Hypertension Father    Alzheimer's disease Father    Prostate cancer Father    Hypertension Maternal Grandfather    Seizures Other    Colon cancer Neg Hx    Stomach cancer Neg Hx    Rectal cancer Neg Hx    Esophageal cancer Neg Hx     ALLERGIES:  has No Known Allergies.  MEDICATIONS:  Current Outpatient Medications  Medication Sig Dispense Refill   aluminum-magnesium hydroxide 200-200 MG/5ML suspension Take 15 mLs by mouth every 6 (six) hours as needed for indigestion.     buPROPion (WELLBUTRIN XL) 150 MG 24 hr tablet Take 150 mg by mouth every evening.     capecitabine (XELODA) 500 MG tablet Take 3 tablets (1,500 mg total) by mouth 2 (two) times daily after a  meal. Take 14 days on, then 7 days off, repeat every 21 days. 180 tablet 3   clobetasol cream (TEMOVATE) 0.05 % Apply 1 Application topically as needed.     clotrimazole-betamethasone (LOTRISONE) cream Apply 1 Application topically 2 (two) times daily.     cyclobenzaprine (FLEXERIL) 5 MG tablet Take 5-10 mg by mouth at bedtime.     diphenoxylate-atropine (LOMOTIL) 2.5-0.025 MG tablet Take 1 tablet by mouth 4 (four) times daily as needed for diarrhea or loose stools. 120 tablet 1   divalproex (DEPAKOTE) 250 MG DR tablet Take 250 mg by mouth 2 (two) times daily.     DULoxetine (CYMBALTA) 60 MG capsule Take 60 mg by mouth at bedtime.      esomeprazole (NEXIUM) 40 MG capsule Take 40 mg by mouth daily. Pt states has been on for years     ibuprofen (ADVIL) 200 MG tablet Take 200 mg by mouth every 6 (six) hours as needed.     levofloxacin (LEVAQUIN) 500 MG tablet Take 1 tablet (500 mg total) by mouth daily. 10 tablet 0   magic mouthwash (lidocaine, diphenhydrAMINE, alum & mag hydroxide) suspension Swish and swallow 5 mLs every 4 (four) hours as needed for mouth pain. 240 mL 0   magic mouthwash (lidocaine, diphenhydrAMINE, alum & mag hydroxide) suspension Swish and swallow 5 mLs every 4 (four) hours as needed for mouth pain. 240 mL 0   meloxicam (MOBIC) 7.5 MG tablet Take 7.5 mg by mouth every morning.     metroNIDAZOLE (FLAGYL) 500 MG tablet Take 1 tablet (500 mg total) by mouth 3 (three) times daily. 30 tablet 0   Multiple Vitamin (MULTIVITAMIN WITH MINERALS) TABS tablet Take 1 tablet by mouth daily.     nortriptyline (PAMELOR) 10 MG capsule Take 10 mg by mouth.     ondansetron (ZOFRAN) 8 MG tablet Take 1 tablet (8 mg total) by mouth 4 (four) times daily as needed for nausea or vomiting. 120 tablet 0   oxyCODONE (OXY IR/ROXICODONE) 5 MG immediate release tablet SMARTSIG:5 Milligram(s) By Mouth Every 4 Hours PRN     potassium chloride SA (KLOR-CON M) 20 MEQ tablet Take 3 tablet today, then start 1 tablet  twice daily 64 tablet 0   predniSONE (DELTASONE) 20 MG tablet Take 20 mg by mouth 2 (two) times daily.     prochlorperazine (COMPAZINE) 10 MG tablet Take 1 tablet (10 mg total) by mouth every 6 (six) hours as needed for nausea or vomiting. 30 tablet 1   SUMAtriptan (IMITREX) 25 MG tablet Take 25 mg by mouth.     AMBULATORY NON FORMULARY MEDICATION Medication Name: Magic Mouth Wash 3 parts Mylanta/Maalox 2 parts Benadryl  1 part Viscous Lidocaine  Swish and Swallow 5 mL every 3-4 hours as needed (Patient not taking: Reported on 05/30/2023) 240 mL 0   Current Facility-Administered Medications  Medication Dose Route Frequency Provider Last Rate Last Admin   triamcinolone acetonide (KENALOG-40) injection 20 mg  20 mg Other Once Asencion Islam, DPM        PHYSICAL EXAMINATION:  ECOG PERFORMANCE STATUS: 1 - Symptomatic but completely ambulatory   Vitals:   06/03/23 1028  BP: 111/64  Pulse: 94  Resp: 16  Temp: 97.7 F (36.5 C)  SpO2: 98%      Filed Weights   06/03/23 1028  Weight: 218 lb 6.4 oz (99.1 kg)       Physical Exam Vitals and nursing note reviewed.  Constitutional:      General: She is not in acute distress.    Appearance: She is obese. She is not ill-appearing, toxic-appearing or diaphoretic.     Comments: Here with husband.  Ill appearing  HENT:     Head: Normocephalic and atraumatic.     Right Ear: External ear normal.     Left Ear: External ear normal.     Nose: Nose normal. No congestion or rhinorrhea.     Mouth/Throat:     Mouth: Mucous membranes are dry.     Pharynx: No oropharyngeal exudate or posterior oropharyngeal erythema.  Eyes:     General: No scleral icterus.    Extraocular Movements: Extraocular movements intact.     Conjunctiva/sclera: Conjunctivae normal.     Pupils: Pupils are equal, round, and reactive to light.  Cardiovascular:     Rate and Rhythm: Normal rate.     Heart sounds: No murmur heard.    No friction rub. No gallop.   Abdominal:     General: Bowel sounds are normal.     Palpations: Abdomen is soft.     Tenderness: There is no abdominal tenderness. There is no guarding or rebound.  Musculoskeletal:        General: No swelling, tenderness or deformity.     Cervical back: Normal range of motion and neck supple. No rigidity or tenderness.     Right lower leg: No edema.     Left lower leg: No edema.  Lymphadenopathy:     Head:     Right side of head: No submental, submandibular, tonsillar, preauricular, posterior auricular or occipital adenopathy.     Left side of head: No submental, submandibular, tonsillar, preauricular, posterior auricular or occipital adenopathy.     Cervical: No cervical adenopathy.     Right cervical: No superficial, deep or posterior cervical adenopathy.    Left cervical: No superficial, deep or posterior cervical adenopathy.     Upper Body:     Right upper body: No supraclavicular, axillary, pectoral or epitrochlear adenopathy.     Left upper body: No supraclavicular, axillary, pectoral or epitrochlear adenopathy.  Skin:    General: Skin is warm and dry.     Coloration: Skin is not jaundiced.  Neurological:     General: No focal deficit present.     Mental Status: She is alert and oriented to person, place, and time.     Cranial Nerves: No cranial nerve deficit.     Comments: Seated in wheelchair.  Falls asleep during interview but arousable  Psychiatric:     Comments: Forgetful.  Talkative.  Insight not good      LABORATORY DATA: I have personally reviewed the data as listed:  Appointment  on 05/29/2023  Component Date Value Ref Range Status   LDH 05/29/2023 255 (H)  98 - 192 U/L Final   Performed at [facility], [address], [place], [place] [address]   WBC Count 05/29/2023 2.3 (L)  4.0 - 10.5 K/uL Final   RBC 05/29/2023 3.07 (L)  3.87 - 5.11 MIL/uL Final   Hemoglobin 05/29/2023 10.1 (L)  12.0 - 15.0 g/dL Final   HCT 95/62/1308 30.4 (L)  36.0  - 46.0 % Final   MCV 05/29/2023 99.0  80.0 - 100.0 fL Final   MCH 05/29/2023 32.9  26.0 - 34.0 pg Final   MCHC 05/29/2023 33.2  30.0 - 36.0 g/dL Final   RDW 65/78/4696 22.2 (H)  11.5 - 15.5 % Final   Platelet Count 05/29/2023 84 (L)  150 - 400 K/uL Final   Comment: SPECIMEN CHECKED FOR CLOTS Immature Platelet Fraction may be clinically indicated, consider ordering this additional test [id number] REPEATED TO VERIFY PLATELET COUNT CONFIRMED BY SMEAR    nRBC 05/29/2023 0.0  0.0 - 0.2 % Final   Neutrophils Relative % 05/29/2023 66  % Final   Neutro Abs 05/29/2023 1.5 (L)  1.7 - 7.7 K/uL Final   Lymphocytes Relative 05/29/2023 18  % Final   Lymphs Abs 05/29/2023 0.4 (L)  0.7 - 4.0 K/uL Final   Monocytes Relative 05/29/2023 13  % Final   Monocytes Absolute 05/29/2023 0.3  0.1 - 1.0 K/uL Final   Eosinophils Relative 05/29/2023 0  % Final   Eosinophils Absolute 05/29/2023 0.0  0.0 - 0.5 K/uL Final   Basophils Relative 05/29/2023 1  % Final   Basophils Absolute 05/29/2023 0.0  0.0 - 0.1 K/uL Final   WBC Morphology 05/29/2023 TOXIC GRANULATION   Final   Smear Review 05/29/2023 MORPHOLOGY UNREMARKABLE   Final   Immature Granulocytes 05/29/2023 2  % Final   Abs Immature Granulocytes 05/29/2023 0.04  0.00 - 0.07 K/uL Final   Ovalocytes 05/29/2023 PRESENT   Final   Performed at [facility] [address] [place], [place] [address]   Haptoglobin 05/29/2023 <10 (L)  37 - 355 mg/dL Final   Comment: (NOTE) Performed At: [facility] [address] [place], [place] [id number] Jolene Schimke MD QI:[id number]    DAT, complement 05/29/2023 NEG   Final   DAT, IgG 05/29/2023    Final                   Value:NEG Performed at [facility], [address] [place], [place] [address]   Abstract on 05/21/2023  Component Date Value Ref Range Status   Glucose 05/21/2023 96   Final   BUN 05/21/2023 15  4 - 21 Final   CO2 05/21/2023 27 (A)  13 - 22 Final    Creatinine 05/21/2023 0.9  0.5 - 1.1 Final   Potassium 05/21/2023 3.4 (A)  3.5 - 5.1 mEq/L Final   Sodium 05/21/2023 135 (A)  137 - 147 Final   Chloride 05/21/2023 100  99 - 108 Final   Calcium 05/21/2023 9.5  8.7 - 10.7 Final   Albumin 05/21/2023 3.8  3.5 - 5.0 Final   Alkaline Phosphatase 05/21/2023 76  25 - 125 Final   ALT 05/21/2023 17  7 - 35 U/L Final   AST 05/21/2023 38 (A)  13 - 35 Final   Bilirubin, Total 05/21/2023 1.1   Final   Magnesium 05/21/2023 1.70   Final  Abstract on 05/21/2023  Component  Date Value Ref Range Status   Hemoglobin 05/21/2023 10.4 (A)  12.0 - 16.0 Final   HCT 05/21/2023 30 (A)  36 - 46 Final   Neutrophils Absolute 05/21/2023 1.75   Final   Platelets 05/21/2023 50 (A)  150 - 400 K/uL Final   WBC 05/21/2023 3.8   Final   RBC 05/21/2023 3.28 (A)  3.87 - 5.11 Final  Appointment on 05/21/2023  Component Date Value Ref Range Status   CBC with Differential 05/21/2023 Sent to [facility] for testing. See scanned report.   Final   Performed at [facility], [address], [place], [place] [address]   Comprehensive Metabolic Panel 05/21/2023 Sent to [facility] for testing. See scanned report.   Final   Performed at [facility], [address], [place], [place] [address]  Abstract on 05/13/2023  Component Date Value Ref Range Status   Hemoglobin 05/13/2023 10.8 (A)  12.0 - 16.0 Final   HCT 05/13/2023 31 (A)  36 - 46 Final   Neutrophils Absolute 05/13/2023 2.84   Final   Platelets 05/13/2023 91 (A)  150 - 400 K/uL Final   WBC 05/13/2023 3.5   Final   RBC 05/13/2023 3.43 (A)  3.87 - 5.11 Final   Glucose 05/13/2023 148   Final   BUN 05/13/2023 9  4 - 21 Final   CO2 05/13/2023 29 (A)  13 - 22 Final   Creatinine 05/13/2023 0.6  0.5 - 1.1 Final   Potassium 05/13/2023 3.3 (A)  3.5 - 5.1 mEq/L Final   Sodium 05/13/2023 137  137 - 147 Final   Chloride 05/13/2023 102  99 - 108 Final   Calcium 05/13/2023 9.1  8.7 -  10.7 Final   Albumin 05/13/2023 3.6  3.5 - 5.0 Final   Alkaline Phosphatase 05/13/2023 82  25 - 125 Final   ALT 05/13/2023 18  7 - 35 U/L Final   AST 05/13/2023 38 (A)  13 - 35 Final   Bilirubin, Total 05/13/2023 1.1   Final   Magnesium 05/13/2023 1.90   Final  Appointment on 05/09/2023  Component Date Value Ref Range Status   Potassium 05/09/2023 3.0 (L)  3.5 - 5.1 mmol/L Final   Performed at [facility], [address] [place], [place] [address]   Magnesium 05/09/2023 1.7  1.7 - 2.4 mg/dL Final   Performed at [facility], [address] [place], [place] [address]  Appointment on 05/08/2023  Component Date Value Ref Range Status   Magnesium 05/08/2023 1.9  1.7 - 2.4 mg/dL Final   Performed at [facility], [address] [place], [place] [address]  Office Visit on 05/08/2023  Component Date Value Ref Range Status   WBC 05/08/2023 3.3 (L)  4.0 - 10.5 K/uL Final   RBC 05/08/2023 3.26 (L)  3.87 - 5.11 MIL/uL Final   Hemoglobin 05/08/2023 10.2 (L)  12.0 - 15.0 g/dL Final   HCT 27/03/2375 29.7 (L)  36.0 - 46.0 % Final   MCV 05/08/2023 91.1  80.0 - 100.0 fL Final   MCH 05/08/2023 31.3  26.0 - 34.0 pg Final   MCHC 05/08/2023 34.3  30.0 - 36.0 g/dL Final   RDW 28/31/5176 17.4 (H)  11.5 - 15.5 % Final   Platelets 05/08/2023 89 (L)  150 - 400 K/uL Final   Comment: SPECIMEN CHECKED FOR CLOTS Immature Platelet Fraction may be clinically indicated, consider ordering this additional test [id number] REPEATED TO VERIFY  PLATELET COUNT CONFIRMED BY SMEAR    nRBC 05/08/2023 0.0  0.0 - 0.2 % Final   Neutrophils Relative % 05/08/2023 52  % Final   Neutro Abs 05/08/2023 1.7  1.7 - 7.7 K/uL Final   Lymphocytes Relative 05/08/2023 21  % Final   Lymphs Abs 05/08/2023 0.7  0.7 - 4.0 K/uL Final   Monocytes Relative 05/08/2023 23  % Final   Monocytes Absolute 05/08/2023 0.8  0.1 - 1.0 K/uL Final   Eosinophils Relative 05/08/2023 4  % Final    Eosinophils Absolute 05/08/2023 0.1  0.0 - 0.5 K/uL Final   Basophils Relative 05/08/2023 0  % Final   Basophils Absolute 05/08/2023 0.0  0.0 - 0.1 K/uL Final   Immature Granulocytes 05/08/2023 0  % Final   Abs Immature Granulocytes 05/08/2023 0.01  0.00 - 0.07 K/uL Final   Performed at [facility], [address], [place], [place] [address]   Sodium 05/08/2023 138  135 - 145 mmol/L Final   Potassium 05/08/2023 2.5 (LL)  3.5 - 5.1 mmol/L Final   Comment: CRITICAL RESULT CALLED TO, READ BACK BY AND VERIFIED WITH DUNLAP, K RN @ 1432 ON 05/08/2023 BY ABDULHALIM,M    Chloride 05/08/2023 102  98 - 111 mmol/L Final   CO2 05/08/2023 26  22 - 32 mmol/L Final   Glucose, Bld 05/08/2023 100 (H)  70 - 99 mg/dL Final   Glucose reference range applies only to samples taken after fasting for at least 8 hours.   BUN 05/08/2023 12  8 - 23 mg/dL Final   Creatinine, Ser 05/08/2023 0.86  0.44 - 1.00 mg/dL Final   Calcium 13/24/4010 8.8 (L)  8.9 - 10.3 mg/dL Final   Total Protein 27/25/3664 6.1 (L)  6.5 - 8.1 g/dL Final   Albumin 40/34/7425 3.8  3.5 - 5.0 g/dL Final   AST 95/63/8756 29  15 - 41 U/L Final   ALT 05/08/2023 20  0 - 44 U/L Final   Alkaline Phosphatase 05/08/2023 62  38 - 126 U/L Final   Total Bilirubin 05/08/2023 1.3 (H)  0.3 - 1.2 mg/dL Final   GFR, Estimated 05/08/2023 >60  >60 mL/min Final   Comment: (NOTE) Calculated using the CKD-EPI Creatinine Equation (2021)    Anion gap 05/08/2023 10  5 - 15 Final   Performed at [facility], [address], [place], [place] [address]    RADIOGRAPHIC STUDIES: I have personally reviewed the radiological images as listed and agree with the findings in the report  No results found.  ASSESSMENT/PLAN  67 y.o. female is here because of  colon cancer.  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 Mass was partially circumferential (involves < 1/3rd of the lumen circumference). 10 mm polyp in proximal ascending colon which was sessile.  Pathology ulcerated mass- invasive moderately differentiated adenocarcinoma arising within a tubular adenoma with high-grade dysplasia   January 15 2023- CEA 2.9 Chromogranin A 564  January 17 2023- CT CAP  Short segment asymmetric wall thickening of the transverse colon measuring 3.3 cm in length.   Small adjacent soft tissue nodule/lymph nodes adjacent to the colonic wall thickening  measuring 6 mm, suspicious  for local nodal disease. Prominent/mildly enlarged right upper quadrant lymph nodes similar to January 26, 2019 and favored reactive.  No distant metastatic disease.  Hepatomegaly with nodular hepatic contour, suggestive of cirrhosis Cholelithiasis without findings of acute cholecystitis.   Incidental right thyroid nodule   January 23 2023- Chromogranin A 294  February 04 2023:  Transverse colectomy.  Pathology showed adenocarcinoma invading visceral peritoneum, Grade 3.  All of 30 lymph nodes negative for tumor.  MSI intact Feb 25 2023- On basis of spontaneous improvement in chromogranin A will hold on Dodatate PET.     Therapeutics-  Feb 25 2023- Reviewed [facility]CC[facility] treatment guidelines with patient and husband.  They are agreeable to proceed with adjuvant CAPOX x 6 months.   Mar 11 2023- Cycle 1 XelOx Mar 20 2023- Tolerated cycle 1 well with only some mild cold neuropathy April 01 2023- Cycle 2 XelOx   April 22 2023: Cycle 3 XelOx.              April 30 2023- Experiencing mucositis, anorexia, nausea and diarrhea.    May 13 2023: Cycle 4 XelOX.  Dose reduced Xeloda from 2000 mg bid to 1500 mg bid due to GI symptoms.   This will be her last cycle of adjuvant therapy  May 23 2023- Stopped Xeloda due to side effects and referring for admission.  Contacted ED attending   Chemotherapy induced nausea and diarrhea             April 30 2023-  Instructed patient to do the following-- Take Ondansetron 8 mg, four times daily.  Take Imodium 4 mg every 4 hrs for diarrhea Use the magic mouthwash every 4 hours while awake.  Arranged for patient to receive IVF and antiemetics.  To follow up in 2 days to assess progress     May 02 2023- To complete this course of Xeloda on July 15th.  Should improve with the week off between then and beginning of Cycle 4.  Anticipate dose reduction in Xeloda with Cycle 4.  Sx under better control today than at last visit due to better use of supportive care May 08 2023-  Continues to have 5 to 6 bowel movements daily.  Using imodium 2 mg q 5 hrs.  Instructed patient to use imodium 4 mg po q 4 hrs PRN diarrhea Adding lomotil up to qid PRN.  Has required potassium replacement due to diarrheal losses May 21 2023- Continues to have significant chemotherapy induced diarrhea.  Not using supportive care medications to maximum potential. Provided written instructions to patient and husband again today.  Adding Levaquin 500 mg daily.  Sending stool for C diff.  Referring to infusion for IVF.  Chemistries sent to guide electrolyte replacement.  Follow up in 2 days.   Sent testing to evaluate for DPD deficiency.  Discussed plans with clinical pharmacist.   May 23 2023- Diarrhea improved with use of scheduled imodium, lomotil, ondansetron and since starting Levaquin daily.  Remains dehydrated as judged by exam (mucosa and skin dry, tachycardic) May 30 2023- Diarrhea persists.  Starting empiric Levaquin/Flagyl for diverticulitis/gastroenteritis   Mental status changes May 23 2023- Likely multifactorial- 1) psychotropic medications (welbutrin, cymbalta, nortryptiline, depakote)  2) supportive care  medications (Flexeril, lomotil, imodium, ondansetron)  3) chemotherapy (Xeloda) 4) Dehydration 5) underlying liver disease  Recommended admission via ED for evaluation and management.  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  GI pathogen panel negative negative for Shiga toxin and bacteria known to produce Shiga Toxin   May 30 2023- Laboratory evaluation notable for Hgb 10.1 PLT 84, LDH 255.  DAT negative.  Haptoglobin < 10.  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  Arranging for treatment with Ultomiris.  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  At that time TTP and HUS were distinguishable only on clinical grounds and were thought of as being almost the same disease.  The entity of atypical HUS was not known at the time.  It is therefore possible that patient had atypical HUS at that time which responded to plasma exchange  Diarrhea:             January 15 2023- Not explained by the colon cancer.  No evidence of inflammatory bowel disease by colonoscopy.    Chromogranin A 564  January 23 2023- Repeat Chromogranin A improved  Feb 25 2023- Diarrhea and chromogranin A improved therefore holding on Dotatate PET/CT     Poor venous access:    Mar 11 2023- Port placement   Headaches Mar 07 2023- Improved following surgery likely related to a component of general anesthesia. Subsequently evaluated by neurology  Possible cirrhosis  January 17 2023- Nodular liver contour noted on CT CAP    Cancer Staging  Colon cancer [facility]) Staging form: Colon and Rectum, AJCC 8th Edition - Clinical stage from 02/25/2023: Stage IIB (cT4a, cN0, cM0) - Signed by Loni Muse, MD on 02/25/2023 Histopathologic type: Adenocarcinoma, NOS Stage prefix: Initial diagnosis Total positive nodes: 0 Total nodes examined: 30 Histologic grade (G): G3 Histologic grading system: 4 grade system    No problem-specific Assessment & Plan notes found for this encounter.    Orders Placed This Encounter  Procedures   E Coli Shiga Toxin, EIA   CBC with Differential/Platelet   Haptoglobin    Standing Status:   Future    Standing Expiration Date:   06/02/2024   APTT    Standing Status:   Future    Standing Expiration Date:   06/02/2024   Protime-INR    Standing Status:   Future    Standing Expiration Date:   06/02/2024   Reticulocytes    Standing Status:   Future    Standing Expiration Date:   06/02/2024   Fibrinogen    Standing Status:   Future    Standing Expiration Date:   06/02/2024   ANA Comprehensive Panel    Standing Status:   Future    Standing Expiration Date:   06/02/2024   Cardiolipin antibodies, IgM+IgG    Standing Status:   Future    Standing Expiration Date:   06/02/2024   Beta-2-glycoprotein i abs, IgG/M/A    Standing Status:  Future    Standing Expiration Date:   06/02/2024   Heparin induced platelet Ab (HIT antibody)    Standing Status:   Future    Standing Expiration Date:   06/02/2024   Hepatitis panel, acute    Standing Status:   Future    Standing Expiration Date:   06/02/2024   Lupus anticoagulant panel    Standing Status:   Future    Standing Expiration Date:   06/02/2024   D-dimer, quantitative     Standing Status:   Future    Standing Expiration Date:   06/02/2024   C3 and C4    Standing Status:   Future    Standing Expiration Date:   06/02/2024   Technologist smear review    Standing Status:   Future    Standing Expiration Date:   06/02/2024    Order Specific Question:   Clinical information:    Answer:   thrombocytopenia    107  minutes was spent in patient care.  This included time spent preparing to see the patient (e.g., review of tests), obtaining and/or reviewing separately obtained history, counseling and educating the patient/family/caregiver, ordering medications, tests, or procedures; documenting clinical information in the electronic or other health record, independently interpreting results and communicating results to the patient/family/caregiver as well as coordination of care.       All questions were answered. The patient knows to call the clinic with any problems, questions or concerns.  This note was electronically signed.    Loni Muse, MD  06/03/2023 10:37 AM

## 2023-06-04 ENCOUNTER — Ambulatory Visit: Payer: Medicare PPO | Admitting: Dietician

## 2023-06-04 ENCOUNTER — Ambulatory Visit: Payer: Medicare PPO | Admitting: Oncology

## 2023-06-04 ENCOUNTER — Other Ambulatory Visit: Payer: Self-pay

## 2023-06-04 ENCOUNTER — Encounter: Payer: Self-pay | Admitting: Oncology

## 2023-06-04 ENCOUNTER — Telehealth: Payer: Self-pay | Admitting: Dietician

## 2023-06-04 ENCOUNTER — Other Ambulatory Visit: Payer: Medicare PPO

## 2023-06-04 ENCOUNTER — Other Ambulatory Visit (HOSPITAL_COMMUNITY): Payer: Self-pay

## 2023-06-04 ENCOUNTER — Encounter: Payer: Self-pay | Admitting: Pharmacist

## 2023-06-04 DIAGNOSIS — K7460 Unspecified cirrhosis of liver: Secondary | ICD-10-CM

## 2023-06-04 DIAGNOSIS — D5939 Other hemolytic-uremic syndrome: Secondary | ICD-10-CM | POA: Insufficient documentation

## 2023-06-04 DIAGNOSIS — C184 Malignant neoplasm of transverse colon: Secondary | ICD-10-CM

## 2023-06-04 NOTE — Progress Notes (Signed)
NUTRITION FOLLOW UP:  Spouse returned call and gave phone to patient. She reports she still barely eating one meal a day and trying to get some protein in.  Still struggling with taste changes, she forgot recommendation to use baking soda swishes.  She is still very interested in continuing to lose weight.  She now says she hasn't had a bowel movement for 5 days, and is unsure if she's still taking any anti diarrhea meds.  Willing to trial prune or prune juice, likes beans and willing to trial them as long as she isn't having loose stools. Some foods she's tolerating:  [place] food, hamburger sauce.   Omelettes, cheese, apples Can't tolerate any breads, Greek yogurt, banana or coffee anymore She drinking well: Water  gallon, juice   LABS: 06/03/23  Hgb 9.7 and falling  MEDICATION:   Anthropometrics: large fluctuations ( MD notes LE edema )  Height:  Weight: 06/03/23  218.4# 06/11/23 220#  05/23/23  215.7# 05/21/23  214.3# 05/13/23 225# 04/22/23  244# UBW: 244-250 past year BMI: 34.69   Estimated Energy Needs  Kcals: 2500-3000 Protein: 119-149 Fluid: 3 L  NUTRITION DIAGNOSIS: Inadequate PO intake to meet increased nutrient needs, r/t cancer diagnosis and NIS of treatments   INTERVENTION:  Relayed that weight maintenance is goal during treatments. Encouraged at least 3 meals a day with 20-30 grams of protein at each meal Reviewed use of baking soda swishes to cleanse mouth and remove film and help with taste.  Requested motivating factors to aid in better nourishment,  Emailed baking soda recipe, protein sources with contact information    MONITORING, EVALUATION, GOAL: weight, PO intake, Nutrition Impact Symptoms, labs Goal is weight maintenance  Next Visit: Remote next week, prefers afternoons  Gennaro Africa, RDN, LDN Registered Dietitian, [facility] Part Time Remote (Usual office hours: Tuesday-Thursday) Cell: [phone]

## 2023-06-04 NOTE — Telephone Encounter (Signed)
Attempted to reach patient for a scheduled remote nutrition consult. Provided my cell# on voice mail to return call for his follow up nutrition consult.  Cyndi Dinger, RDN, LDN Registered Dietitian, [facility] Part Time Remote (Usual office hours: Tuesday-Thursday) Cell: [phone]   

## 2023-06-05 ENCOUNTER — Encounter: Payer: Self-pay | Admitting: Oncology

## 2023-06-05 ENCOUNTER — Inpatient Hospital Stay: Payer: Medicare PPO

## 2023-06-05 VITALS — BP 97/60 | HR 90 | Temp 98.0°F | Resp 18 | Ht 65.9 in | Wt 218.1 lb

## 2023-06-05 DIAGNOSIS — E860 Dehydration: Secondary | ICD-10-CM | POA: Diagnosis not present

## 2023-06-05 DIAGNOSIS — Z23 Encounter for immunization: Secondary | ICD-10-CM | POA: Diagnosis not present

## 2023-06-05 DIAGNOSIS — Z5111 Encounter for antineoplastic chemotherapy: Secondary | ICD-10-CM | POA: Diagnosis not present

## 2023-06-05 DIAGNOSIS — D5939 Other hemolytic-uremic syndrome: Secondary | ICD-10-CM

## 2023-06-05 DIAGNOSIS — D594 Other nonautoimmune hemolytic anemias: Secondary | ICD-10-CM | POA: Diagnosis not present

## 2023-06-05 DIAGNOSIS — E876 Hypokalemia: Secondary | ICD-10-CM | POA: Diagnosis not present

## 2023-06-05 DIAGNOSIS — D6959 Other secondary thrombocytopenia: Secondary | ICD-10-CM | POA: Diagnosis not present

## 2023-06-05 DIAGNOSIS — Z79899 Other long term (current) drug therapy: Secondary | ICD-10-CM | POA: Diagnosis not present

## 2023-06-05 DIAGNOSIS — D649 Anemia, unspecified: Secondary | ICD-10-CM | POA: Diagnosis not present

## 2023-06-05 DIAGNOSIS — M311 Thrombotic microangiopathy, unspecified: Secondary | ICD-10-CM

## 2023-06-05 DIAGNOSIS — C184 Malignant neoplasm of transverse colon: Secondary | ICD-10-CM | POA: Diagnosis not present

## 2023-06-05 MED ORDER — MENINGOCOCCAL A C Y&W-135 OLIG IM SOLN
0.5000 mL | Freq: Once | INTRAMUSCULAR | Status: AC
  Administered 2023-06-05: 0.5 mL via INTRAMUSCULAR
  Filled 2023-06-05: qty 0.5

## 2023-06-05 MED ORDER — MENINGOCOCCAL A C Y&W-135 OLIG IM SOLN
0.5000 mL | Freq: Once | INTRAMUSCULAR | Status: DC
  Filled 2023-06-05: qty 0.5

## 2023-06-05 MED ORDER — MENINGOCOCCAL VAC B (OMV) IM SUSY
0.5000 mL | PREFILLED_SYRINGE | Freq: Once | INTRAMUSCULAR | Status: AC
  Administered 2023-06-05: 0.5 mL via INTRAMUSCULAR
  Filled 2023-06-05: qty 0.5

## 2023-06-05 NOTE — Patient Instructions (Addendum)
Meningococcal ACWY Vaccine Injection  What is this medication? MENINGOCOCCAL ACWY VACCINE (muh nin jeh KOK kul ACWY vak SEEN), or MENINGOCOCCAL CONJUGATE VACCINE (muh nin jeh KOK kul KON juh geyt vak SEEN), reduces the risk of meningitis. It does not treat meningitis. It is still possible to get meningitis after receiving this vaccine, but the symptoms may be less severe or not last as long. It works by helping your immune system learn how to fight off a future infection. This medicine may be used for other purposes; ask your health care provider or pharmacist if you have questions. COMMON BRAND NAME(S): Menactra, MenQuadfi, Menveo What should I tell my care team before I take this medication? They need to know if you have any of these conditions: Bleeding disorder Fever or infection History of Guillain-Barre syndrome Immune system problems An unusual or allergic reaction to diphtheria toxoid, meningococcal vaccine, latex, other vaccines, other medications, foods, dyes, or preservatives Pregnant or trying to get pregnant Breastfeeding How should I use this medication? This medication is injected into a muscle. It is given by your care team. A copy of Vaccine Information Statements will be given before each vaccination. Be sure to read this information carefully each time. This sheet may change often. Talk to your care team about the use of this medication in children. While it may be prescribed for children as young as 9 months for selected conditions, precautions do apply. Overdosage: If you think you have taken too much of this medicine contact a poison control center or emergency room at once. NOTE: This medicine is only for you. Do not share this medicine with others. What if I miss a dose? This does not apply. What may interact with this medication? Adalimumab Anakinra Certain medications for arthritis Infliximab Medications for organ transplant Medications to treat  cancer Medications used during some procedures to diagnose a medical condition Other vaccines Steroid medications, such as prednisone or cortisone This list may not describe all possible interactions. Give your health care provider a list of all the medicines, herbs, non-prescription drugs, or dietary supplements you use. Also tell them if you smoke, drink alcohol, or use illegal drugs. Some items may interact with your medicine. What should I watch for while using this medication? Report any side effects to your care team right away. Call your care team if you have any unusual symptoms within 6 weeks of getting this vaccine. This vaccine may not protect from all meningitis infections. Talk to your care team if you may be pregnant. What side effects may I notice from receiving this medication? Side effects that you should report to your care team as soon as possible: Allergic reactions--skin rash, itching, hives, swelling of the face, lips, tongue, or throat Feeling faint or lightheaded Side effects that usually do not require medical attention (report these to your care team if they continue or are bothersome): Diarrhea General discomfort and fatigue Headache Irritability Muscle pain Pain, redness, or irritation at injection site This list may not describe all possible side effects. Call your doctor for medical advice about side effects. You may report side effects to FDA at 1-800-FDA-1088. Where should I keep my medication? This vaccine is only given by your care team. It will not be stored at home. NOTE: This sheet is a summary. It may not cover all possible information. If you have questions about this medicine, talk to your doctor, pharmacist, or health care provider.  2024 Elsevier/Gold Standard (2022-03-20 00:00:00) Meningococcal Meningitis  Meningococcal meningitis is an inflammation of the lining (meninges) around the brain and spinal cord. It is caused by a type of bacteria  that can also infect the bloodstream. This bacteria can spread from person to person (is contagious) through close contact. Meningococcal meningitis is a medical emergency. It can be life-threatening if it is not treated quickly with antibiotics. Complications can include hearing loss and brain damage. Most cases of meningococcal meningitis can be prevented with a vaccination. What are the causes? This condition is caused by meningococcus bacteria (Neisseria meningitidis). Some people normally have this bacteria in their nose or throat. For some people, the bacteria do not cause problems. For others, the bacteria cause meningococcal meningitis. It is not known why some people who carry the bacteria get the disease and others do not. You can get the disease if fluid from an infected person's nose, mouth, or throat gets into your body and travels to your brain. It is spread by close contact with someone who is infected with the bacteria, such as living with or kissing an infected person. Most often, this condition spreads when an infected person coughs or sneezes. It usually spreads only to people who are in close contact with the infected person for long periods of time, such as family members or roommates. What increases the risk? You are more likely to develop this condition if you: Are young. Children, teens, and young adults are more likely to get this condition than adults. Live close to many other people, such as in a college dorm or [employer]. Have a medical condition or take certain medicines that lower the body's ability to fight infection. Travel to sub[place]. What are the signs or symptoms? Symptoms of this condition may develop suddenly. They may include: High fever. Stiff neck. Headache. Sensitivity to light. Confusion. Nausea and vomiting. If the infection spreads to the blood (meningococcal septicemia), the following symptoms may develop: Chills. Tiredness  (fatigue). Muscle aches. Nausea, vomiting, and diarrhea. Rapid breathing. Red spots or purple blotches on the skin. Seizures. How is this diagnosed? This condition is diagnosed based on: Your symptoms. A physical exam. Blood tests. Lumbar puncture. This is a procedure to remove a sample of cerebrospinal fluid, which is the fluid that surrounds the brain and spinal cord. The fluid samples are sent to a lab for testing to see if meningococcal bacteria will grow from them (cultures). Imaging tests, such as a CT scan or an MRI, to check for changes in your brain. How is this treated? This condition is treated with antibiotics in a hospital. It is important to begin taking these medicines right away. The antibiotics may be given through an IV that is put into one of your veins. Most people receive IV antibiotics for about 1 week. Depending on your condition, you may need other treatments, such as: Fluids given through an IV. Oxygen. Medicine to increase your blood pressure. A machine to clean your blood (dialysis) if your kidneys are injured by the infection. A machine to help you breathe (mechanical ventilation) if your lungs are injured by the infection. Follow these instructions at home: Medicines Take your antibiotics as told by your health care provider. Do not stop taking the antibiotics even if you start to feel better. Take other over-the-counter and prescription medicines only as told by your health care provider. 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 They may need to see a health care provider for  treatment. Even people who do not get sick may need to take an antibiotic that will lower their chance of getting infected. Drink enough fluid to keep your urine pale yellow. Rest at home until you feel better. Return to your normal activities as told by your health care provider. How is this prevented?  Talk  with your health care provider about: Getting the meningococcal vaccine to prevent meningitis. Getting antibiotics if you have close contact with someone who has meningitis. Avoid touching your hands to your face when you have not washed your hands recently. Wash your hands often with soap and water for at least 20 seconds. If soap and water are not available, use alcohol-based hand sanitizer. Avoid close contact with people who are sick. Disinfect counters and other frequently touched surfaces if someone in your home is sick. Stay home while you are sick, and try to stay away from others as much as possible to avoid spreading the infection. Cover your nose and mouth when you sneeze or cough. Where to find more information Centers for Disease Control and Prevention: FootballExhibition.com.br Contact a health care provider if: You have a fever that does not get better with medicine. Get help right away if: You have a fever along with one of the following: Severe headache. Stiff neck. Confusion. Vomiting. You suddenly lose hearing or vision. You have a seizure. You have trouble breathing. These symptoms may be an emergency. Get help right away. Call 911. Do not wait to see if the symptoms will go away. Do not drive yourself to the hospital. Summary Meningococcal meningitis is an inflammation of the lining (meninges) around the brain and spinal cord. This condition is a medical emergency. It can be life-threatening if it is not treated quickly with antibiotics. You can get the disease if fluid from an infected person's nose, mouth, or throat gets into your body and travels to your brain. Symptoms of the condition develop quickly. Talk with your health care provider about getting the meningococcal vaccine to prevent meningitis. This information is not intended to replace advice given to you by your health care provider. Make sure you discuss any questions you have with your health care  provider. Document Revised: 12/28/2021 Document Reviewed: 12/28/2021 Elsevier Patient Education  2024 Elsevier Inc. Meningococcal B (3 strain) Vaccine Injection What is this medication? MENINGOCOCCAL B VACCINE (muh nin jeh KOK kul B vak SEEN) reduces the risk of meningitis. It does not treat meningitis. It is still possible to get meningitis after receiving this vaccine, but the symptoms may be less severe or not last as long. It works by helping your immune system learn how to fight off a future infection. This medicine may be used for other purposes; ask your health care provider or pharmacist if you have questions. COMMON BRAND NAME(S): BEXSERO What should I tell my care team before I take this medication? They need to know if you have any of these conditions: Bleeding disorder Fever or infection Immune system problems An unusual or allergic reaction to meningococcal vaccine, other vaccines, other medications, foods, dyes, or preservatives Pregnant or trying to get pregnant Breastfeeding How should I use this medication? This vaccine is injected into a muscle. It is given by your care team. This vaccine requires 2 doses to get the full benefit. Set a reminder for when your next dose is due. A copy of Vaccine Information Statements will be given before each vaccination. Be sure to read this information carefully each time. This sheet may  change often. Talk to your care team to see which vaccines are right for you. Some vaccines should not be used in all age groups. Overdosage: If you think you have taken too much of this medicine contact a poison control center or emergency room at once. NOTE: This medicine is only for you. Do not share this medicine with others. What if I miss a dose? Keep appointments for follow-up doses as directed. It is important not to miss your dose. Call your care team if you are unable to keep an appointment. What may interact with this medication? Medications  that lower your chance of fighting an infection Other vaccines This list may not describe all possible interactions. Give your health care provider a list of all the medicines, herbs, non-prescription drugs, or dietary supplements you use. Also tell them if you smoke, drink alcohol, or use illegal drugs. Some items may interact with your medicine. What should I watch for while using this medication? Visit your care team for regular health checks. Before you receive this vaccine, talk to your care team if you have an acute illness. Vaccines can be given to people with mild acute illness, such as the common cold or diarrhea. Discuss with your care team the risks and benefits of receiving this vaccine during a moderate to severe illness. Your care team may choose to wait to give you the vaccine when you feel better. Report any side effects to your care team or to the Vaccine Adverse Event Reporting System (VAERS) website at https://vaers.LAgents.no. This is only for reporting side effects; VAERs staff do not give medical advice. What side effects may I notice from receiving this medication? Side effects that you should report to your care team as soon as possible: Allergic reactions--skin rash, itching, hives, swelling of the face, lips, tongue, or throat Feeling faint or lightheaded Side effects that usually do not require medical attention (report these to your care team if they continue or are bothersome): Fatigue Headache Joint pain Muscle pain Nausea Pain, redness, or irritation at injection site This list may not describe all possible side effects. Call your doctor for medical advice about side effects. You may report side effects to FDA at 1-800-FDA-1088. Where should I keep my medication? This vaccine is only given by your care team. It will not be stored at home. NOTE: This sheet is a summary. It may not cover all possible information. If you have questions about this medicine, talk to your  doctor, pharmacist, or health care provider.  2024 Elsevier/Gold Standard (2023-01-30 00:00:00)

## 2023-06-05 NOTE — Progress Notes (Signed)
[name], rph in to discuss meningococcal vaccines and coming up treatments with pt and pt's husband.

## 2023-06-06 ENCOUNTER — Other Ambulatory Visit: Payer: Self-pay

## 2023-06-06 ENCOUNTER — Inpatient Hospital Stay: Payer: Medicare PPO

## 2023-06-06 VITALS — BP 95/60 | HR 76 | Temp 97.9°F | Resp 18 | Ht 65.9 in | Wt 219.0 lb

## 2023-06-06 DIAGNOSIS — D649 Anemia, unspecified: Secondary | ICD-10-CM | POA: Diagnosis not present

## 2023-06-06 DIAGNOSIS — C184 Malignant neoplasm of transverse colon: Secondary | ICD-10-CM | POA: Diagnosis not present

## 2023-06-06 DIAGNOSIS — E876 Hypokalemia: Secondary | ICD-10-CM | POA: Diagnosis not present

## 2023-06-06 DIAGNOSIS — E860 Dehydration: Secondary | ICD-10-CM | POA: Diagnosis not present

## 2023-06-06 DIAGNOSIS — D6959 Other secondary thrombocytopenia: Secondary | ICD-10-CM | POA: Diagnosis not present

## 2023-06-06 DIAGNOSIS — Z5111 Encounter for antineoplastic chemotherapy: Secondary | ICD-10-CM | POA: Diagnosis not present

## 2023-06-06 DIAGNOSIS — D5939 Other hemolytic-uremic syndrome: Secondary | ICD-10-CM

## 2023-06-06 DIAGNOSIS — Z79899 Other long term (current) drug therapy: Secondary | ICD-10-CM | POA: Diagnosis not present

## 2023-06-06 DIAGNOSIS — D594 Other nonautoimmune hemolytic anemias: Secondary | ICD-10-CM | POA: Diagnosis not present

## 2023-06-06 DIAGNOSIS — Z23 Encounter for immunization: Secondary | ICD-10-CM | POA: Diagnosis not present

## 2023-06-06 DIAGNOSIS — M311 Thrombotic microangiopathy, unspecified: Secondary | ICD-10-CM

## 2023-06-06 MED ORDER — SODIUM CHLORIDE 0.9% FLUSH
10.0000 mL | INTRAVENOUS | Status: DC | PRN
  Administered 2023-06-06: 10 mL

## 2023-06-06 MED ORDER — HEPARIN SOD (PORK) LOCK FLUSH 100 UNIT/ML IV SOLN
500.0000 [IU] | Freq: Once | INTRAVENOUS | Status: AC | PRN
  Administered 2023-06-06: 500 [IU]

## 2023-06-06 MED ORDER — SODIUM CHLORIDE 0.9 % IV SOLN: Freq: Once | INTRAVENOUS | Status: AC

## 2023-06-06 MED ORDER — SODIUM CHLORIDE 0.9 % IV SOLN
2700.0000 mg | Freq: Once | INTRAVENOUS | Status: AC
  Administered 2023-06-06: 2700 mg via INTRAVENOUS
  Filled 2023-06-06: qty 27

## 2023-06-06 NOTE — Patient Instructions (Signed)
[facility]  Discharge Instructions: Thank you for choosing [facility] to provide your oncology and hematology care.  If you have a lab appointment with the Cancer Center, please go directly to the Cancer Center and check in at the registration area.   Wear comfortable clothing and clothing appropriate for easy access to any Portacath or PICC line.   We strive to give you quality time with your provider. You may need to reschedule your appointment if you arrive late (15 or more minutes).  Arriving late affects you and other patients whose appointments are after yours.  Also, if you miss three or more appointments without notifying the office, you may be dismissed from the clinic at the provider's discretion.      For prescription refill requests, have your pharmacy contact our office and allow 72 hours for refills to be completed.    Today you received the following chemotherapy and/or immunotherapy agents Ultomiris      To help prevent nausea and vomiting after your treatment, we encourage you to take your nausea medication as directed.  BELOW ARE SYMPTOMS THAT SHOULD BE REPORTED IMMEDIATELY: *FEVER GREATER THAN 100.4 F (38 C) OR HIGHER *CHILLS OR SWEATING *NAUSEA AND VOMITING THAT IS NOT CONTROLLED WITH YOUR NAUSEA MEDICATION *UNUSUAL SHORTNESS OF BREATH *UNUSUAL BRUISING OR BLEEDING *URINARY PROBLEMS (pain or burning when urinating, or frequent urination) *BOWEL PROBLEMS (unusual diarrhea, constipation, pain near the anus) TENDERNESS IN MOUTH AND THROAT WITH OR WITHOUT PRESENCE OF ULCERS (sore throat, sores in mouth, or a toothache) UNUSUAL RASH, SWELLING OR PAIN  UNUSUAL VAGINAL DISCHARGE OR ITCHING   Items with * indicate a potential emergency and should be followed up as soon as possible or go to the Emergency Department if any problems should occur.  Please show the CHEMOTHERAPY ALERT CARD or IMMUNOTHERAPY ALERT CARD at check-in to the  Emergency Department and triage nurse.  Should you have questions after your visit or need to cancel or reschedule your appointment, please contact [facility]  Dept: [phone]  and follow the prompts.  Office hours are 8:00 a.m. to 4:30 p.m. Monday - Friday. Please note that voicemails left after 4:00 p.m. may not be returned until the following business day.  We are closed weekends and major holidays. You have access to a nurse at all times for urgent questions. Please call the main number to the clinic Dept: [phone] and follow the prompts.  For any non-urgent questions, you may also contact your provider using MyChart. We now offer e-Visits for anyone 55 and older to request care online for non-urgent symptoms. For details visit [email or web address].   Also download the MyChart app! Go to the app store, search "MyChart", open the app, select [place], and log in with your MyChart username and password.

## 2023-06-07 ENCOUNTER — Encounter (HOSPITAL_COMMUNITY): Payer: Self-pay

## 2023-06-07 ENCOUNTER — Telehealth: Payer: Self-pay

## 2023-06-07 ENCOUNTER — Other Ambulatory Visit (HOSPITAL_COMMUNITY): Payer: Self-pay

## 2023-06-07 NOTE — Telephone Encounter (Signed)
I called pt to see how she has tolerated the Ultomiris so far. Pt replied, "I'm ok". She denies N/V, chest pain, cough, fever, chills, skin rash itching, constipation and diarrhea. She admits to intermittent dizziness. Pt reminded to call us if she were to develop temp of 100.4 or higher,day or night. Also encouraged her to call me with any questions/concerns. She verbalized understanding.

## 2023-06-10 ENCOUNTER — Encounter: Payer: Self-pay | Admitting: Oncology

## 2023-06-10 ENCOUNTER — Encounter (HOSPITAL_COMMUNITY): Payer: Self-pay

## 2023-06-10 ENCOUNTER — Inpatient Hospital Stay (INDEPENDENT_AMBULATORY_CARE_PROVIDER_SITE_OTHER): Payer: Medicare PPO | Admitting: Oncology

## 2023-06-10 ENCOUNTER — Other Ambulatory Visit (HOSPITAL_COMMUNITY): Payer: Self-pay

## 2023-06-10 ENCOUNTER — Inpatient Hospital Stay: Payer: Medicare PPO

## 2023-06-10 VITALS — BP 122/58 | HR 96 | Temp 97.6°F | Resp 18 | Ht 65.9 in | Wt 215.4 lb

## 2023-06-10 DIAGNOSIS — D5939 Other hemolytic-uremic syndrome: Secondary | ICD-10-CM

## 2023-06-10 DIAGNOSIS — C184 Malignant neoplasm of transverse colon: Secondary | ICD-10-CM | POA: Diagnosis not present

## 2023-06-10 DIAGNOSIS — K521 Toxic gastroenteritis and colitis: Secondary | ICD-10-CM

## 2023-06-10 DIAGNOSIS — Z09 Encounter for follow-up examination after completed treatment for conditions other than malignant neoplasm: Secondary | ICD-10-CM | POA: Diagnosis not present

## 2023-06-10 DIAGNOSIS — T50905S Adverse effect of unspecified drugs, medicaments and biological substances, sequela: Secondary | ICD-10-CM | POA: Diagnosis not present

## 2023-06-10 DIAGNOSIS — T451X5A Adverse effect of antineoplastic and immunosuppressive drugs, initial encounter: Secondary | ICD-10-CM | POA: Diagnosis not present

## 2023-06-10 DIAGNOSIS — Z862 Personal history of diseases of the blood and blood-forming organs and certain disorders involving the immune mechanism: Secondary | ICD-10-CM

## 2023-06-10 DIAGNOSIS — D594 Other nonautoimmune hemolytic anemias: Secondary | ICD-10-CM | POA: Diagnosis not present

## 2023-06-10 MED ORDER — TRIAMTERENE-HCTZ 37.5-25 MG PO CAPS: 1.0000 | ORAL_CAPSULE | Freq: Every day | ORAL | 1 refills | Status: DC

## 2023-06-10 NOTE — Progress Notes (Signed)
[facility] Cancer Initial Visit:  Patient Care Team: Hurshel Party, NP as PCP - General (Internal Medicine) Loni Muse, MD as Consulting Physician (Internal Medicine)  CHIEF COMPLAINTS/PURPOSE OF CONSULTATION:  Oncology History  Colon cancer [name])  01/15/2023 Initial Diagnosis   Colon cancer (HCC)   02/25/2023 Cancer Staging   Staging form: Colon and Rectum, AJCC 8th Edition - Clinical stage from 02/25/2023: Stage IIB (cT4a, cN0, cM0) - Signed by Loni Muse, MD on 02/25/2023 Histopathologic type: Adenocarcinoma, NOS Stage prefix: Initial diagnosis Total positive nodes: 0 Total nodes examined: 30 Histologic grade (G): G3 Histologic grading system: 4 grade system   03/11/2023 - 05/13/2023 Chemotherapy   Patient is on Treatment Plan : COLORECTAL Xelox (Capeox)(130/850) q21d       HISTORY OF PRESENTING ILLNESS: [name] 67 y.o. female is here because of  colon cancer Medical history notable for septic arthritis of the hip, obesity, atypical chest pain, skin cancer, carpal tunnel syndrome, chronic vaginitis, chronic venous and sufficiency, TTP, syncope  January 10, 2023: Colonoscopy-ulcerated 2 cm nonobstructing small mass with heaped up margins found in distal transverse colon.  Mass was partially circumferential (involves less than one third of the lumen circumference) no bleeding present.  10 mm polyp in proximal ascending colon which was sessile.  Pathology-Ascending colon polyp was compatible with a sessile serrated adenoma without cytologic dysplasia Transverse colon polyp-invasive moderately differentiated adenocarcinoma arising within a tubular adenoma with high-grade dysplasia  January 15 2023: [facility] Oncology Consult Patient reports that the colonoscopy was performed for surveillance; however, she was also having episodes of intermittent diarrhea x 2 to 3 months.  Stools were not bloody, nor was she passing mucus but they were soft.  In  association with these episodes she would also feel dizzy and note an odd smell, nausea.   To see Dr. Logan Bores from surgery on January 22 2023   Social:  Married.  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  Tobacco quit 24 years.  Rare glass of wine  [facility] Mother alive 68 epileptic, dementia Father died 65 Alzheimers, prostate cancer Brother alive 95 arthritis requiring joint replacement,   WBC 6.4 hemoglobin 12.5 MCV 88 platelet count 178 normal differential CMP normal CEA 2.9 chromogranin A 564  January 17 2023:  CT CAP Short segment asymmetric wall thickening of the transverse colon measuring 3.3 cm in length, compatible with reported history of  primary colonic neoplasm.  Small adjacent soft tissue nodule/lymph nodes adjacent to the colonic wall thickening measuring 6 mm, nonspecific are suspicious  for local nodal disease. Prominent/mildly enlarged right upper quadrant lymph nodes are similar dating back to January 26, 2019 and favored reactive.  No convincing evidence of distant metastatic disease within the chest, abdomen or pelvis.  Hepatomegaly with nodular hepatic contour, suggestive of cirrhosis Cholelithiasis without findings of acute cholecystitis.   Incidental right thyroid nodule measuring   January 18 2023:  Presented to [facility] ED with headaches and chest discomfort.  CT PA negative for PE.  CT head without contrast negative.    January 22 2023:  Scheduled follow up for colon cancer.  Reviewed results of labs and imaging with patient and husband. Will repeat chromgranin A and refer for PET/CT if chromogranin A still significantly elevated.    January 23, 2023: Chromogranin A 294  February 04 2023:  Transverse colectomy Pathology showed adenocarcinoma invading visceral peritoneum, Grade 3.  All of 30 lymph nodes were negative for tumor.  Surgical Stage (T4a, N0 M0)  MSI intact  Feb 25, 2023:   Reviewed results of surgical pathology with patient and husband.  Per NCCN guidelines adjuvant  options are CAPEOX, FOLFOX 6 months, Capecitabine or observation.  Discussed risks and benefits of each  Mar 07 2023:   Bifrontal headaches have returned.  These began in October 2023 but resolved after her colon surgery.  Has seen neurology and underwent MRI brain negative for CNS lesion.  EEG normal.   (Raises the question of what medications in perioperative period helped with the HA's.)  Recovering from a dog bite on dorsum of right hand.  Will receive Capecitabine today.    Mar 11 2023:  Port placement.  Cycle 1 XelOx   Mar 19 2023:  Neurology follow up.    Mar 20 2023:   Agree with Neurology assessment that neurocognitive testing should not be performed it at all, until well after chemotherapy has been completed.  Experienced some mild nausea helped by antiemetics.   Has occasional HA's.  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   No HFS.     April 01 2023:  Cycle 2 XelOx  April 17 2023:  Has lost 4 lbs.  Appetite diminished due to dysgeusia.  No metallic taste.  Edges of tongue get red and sore and painful.  Mouth feels dry despite drinking a lot of water and tea.  Not using anything for her mouth sores.   Fatigue increased.  Has cold neuropathy and cold induced pharyngospasm.      Begin Magic mouthwash for mucositis.     April 22 2023:  Cycle 3 XelOx.    April 30 2023:    Patient called office yesterday stating that she was anorectic, nauseated, having mouth sores and diarrhea.  Has lost 13 lbs.  Has difficulty remembering details which complicates history taking.  States that anorexia began after starting this round of chemotherapy.  "Food does not get past my tongue"; did not recognize this was nausea so did not begin taking antiemetics until 3 days ago.  Only taking antiemetics bid.  Mouth feels dry but not painful.  No emesis.  Began with diarrhea in the form of 5 to 6 stools per day but does not believe that she is taking imodium for it.  Dysgeusia  even to water.   Not using magic mouthwash regularly  May 02 2023:    Diarrhea improved with imodium.  Using ondansetron alternating with compazine; no emesis with improvement in nausea.  Eating limited by mucositis which she can't say is helped by magic mouthwash.  Not eating cold foods.  Has gained 2 lbs in the last two days.  To complete the course of Xeloda on May 06 2023.    WBC 4.7 hemoglobin 11.6 platelet count 61; 60 segs 23 lymphs 13 monos 4 eos CMP notable for potassium 3.4 glucose 103  May 08 2023:   Scheduled follow-up regarding colon cancer. Has lost 4 lbs since last visit owing to anorexia.   No mouth sores but has dysgeusia and mouth feels dry.  Using magic mouthwash.  Nauseated but without emesis.  Continues to have 5 to 6 bowel movements daily.  Using imodium 2 mg q 5 hrs.    Instructed patient to use imodium 4 mg po q 4 hrs PRN diarrhea Adding lomotil up to qid PRN Xeloda has not been shipped to patient.   Will dose reduce Xeloda from 2000 mg bid to  1500 mg bid.    May 13 2023:  Cycle 4 XelOX.  Received total of 1 liter IVF during that visit due to hypotension, potassium due to hypokalemia.  Patient was told to increase dose of supplemental potassium  May 21 2023:  Seen as a work in due to side effects from chemotherapy.  Has lost 11 lbs since last visit.  Has been experiencing liquid stools sometimes almost hourly.  No nausea, emesis, mouth sores. Abdominal cramping. No fevers.  Appetite decreased.   Taking imodium 2 mg bid.  Not certain if she is taking lomotil.   Received IVF, IV potassium in infusion center WBC 3.8 hemoglobin 10.4 platelet count 50 CMP notable for potassium 3.4 AST 38 albumin 3.8 magnesium 1.7 DPD 5-fluorouracil toxicity panel sent  May 22 2023:  Fell backwards on steps outside.  Struck right elbow and knee but not head.  Skin tear to right elbow.   May 23, 2023: Scheduled follow-up to monitor side effects from chemotherapy.  Anorectic but eating  some.  Has gained 1 lbs.  Today experienced flash HA and accompanied by chest pain which last 2 to 4 minutes.  Yesterday had three diarrheal stools.  Significant improvement since taking Ondansetron, Lomotil and Imodium on scheduled basis.  Spending most of time sleeping.  Lost balance twice today.  Stopped Xeloda and arrange for hospitalization.    May 25 2023 through May 28 2023:  Admission to [facility]   Potential concern for Xeloda toxicity at this time however will hold off on uridine triacetate therapy and get DPD enzyme deficiency labs to see if pt can tolerate Xeloda going forward with treatment given persistent diarrhea. A diagnosis of TTP was entertained but dismissed as "patient does not have fever and does not have kidney impairment on CMP. Suspicion for TTP as the primary etiology is low. Likely that her mental status and CBC derangements are more likely due to capecitabine use."    Labs were notable negative GI Pathogen panel which included Shiga Toxin-Producing E. coli, E. coli 0157, Enteropathogenic E. coli, Enteroaggregative E. coli, Enterotoxigenic E. coli, Shigella spp.,  WBC2.7 Hgb 8.4 PLT 37 Haptoglobin < 30 Retic 2.1  LDH 239 Cr 0.63  Alb 2.0 INR 1.2 D dimer 0.3  Fibrinogen 202  INR 1.1 U/A trace ketones ADAMS TS 13 was drawn but patient was discharged home before results known  May 30 2023:  Post hospital follow up.  Has gained 5 lbs due to edema acquired during hospitalization.  Did not bring walker today but had one yesterday. Still having watery four to five times daily.  Experiencing LLQ abdominal pain.  Anorectic.  Using imodium twice daily.  Husband not certain how many times she is using lomotil daily.  May be using ondansetron bid.  Memory not good but may be improving slightly.  Will start Levaquin/Flagyl for possible diverticulitis.   Assay for DPD toxicity still in process Obtained labs evening prior to today's visit WBC 2.3 Hgb 10.1 PLT 84  LDH 255 DAT  negative Haptoglobin < 10  DAT negative ADAMSTS13 activity  67%    June 03 2023:  Scheduled follow up.  Diarrhea has improved.  Still having nausea with emesis, anorexia.  Has bilateral LE edema.  Brain fog persists and has shaking.  TTP was diagnosed on Christmas 1989 which was proceeded by flu like symptoms lasting several weeks prior.  Patient notes that urine is dark orange  WBC 3.3 hemoglobin 9.7 MCV 102 platelet count 75.  Reticulocyte count 5.8%.   Coombs test negative.  Haptoglobin undetectable  Anticardiolipin antibody negative.  Antibeta 2 glycoprotein negative.  Heparin-induced platelet antibody negative.  PTT 35.7.  Lupus anticoagulant testing negative. ANA panel negative.  Maternal factor negative.  C3 96 C4 32 Hepatitis C antibody reactive.  PCR negative Hepatitis B antibodies and hepatitis A antibodies negative  June 05 2023:  Meningococcal vaccine injection June 06 2023:  Ultimiris   June 10 2023:  Scheduled follow up for MAHA and colon cancer.     Diarrhea has resolved.  Appetite has improved.  Memory still an issue.  Accidentally took mother's Keppra and Gabapentin over the weekend which were out on the table which resulted in confusion and worsening of balance.  Yesterday while coming into the house fell backwards; no LOC, no head or neck pain.  Husband called poison control which recommended conservative management.    Reviewed mediations and struck off medications not being used.  Will start dyazide daily for edema  June 20 2023:  Ultimiris  Review of Systems  Constitutional:  Positive for appetite change and unexpected weight change. Negative for chills, fatigue and fever.  HENT:   Negative for mouth sores, nosebleeds, sore throat, tinnitus and trouble swallowing.   Eyes:  Negative for eye problems and icterus.       Vision changes:  None  Respiratory:  Negative for chest tightness, hemoptysis, shortness of breath and wheezing.        One month of  nonproductive cough.  No history of seasonable allergies  Cardiovascular:  Negative for chest pain and leg swelling.  Gastrointestinal:  Positive for diarrhea and nausea. Negative for constipation, rectal pain and vomiting.  Endocrine: Negative for hot flashes.       Cold intolerance:  none Heat intolerance:  none  Genitourinary:  Negative for bladder incontinence, difficulty urinating, dysuria, frequency, hematuria and nocturia.   Musculoskeletal:  Positive for gait problem. Negative for arthralgias, back pain, myalgias, neck pain and neck stiffness.  Skin:  Negative for itching, rash and wound.  Neurological:  Positive for dizziness, extremity weakness, gait problem and headaches. Negative for numbness, seizures and speech difficulty.  Hematological:  Negative for adenopathy. Bruises/bleeds easily.  Psychiatric/Behavioral:  Positive for confusion. Negative for sleep disturbance and suicidal ideas. The patient is not nervous/anxious.     MEDICAL HISTORY: Past Medical History:  Diagnosis Date   Abnormal glucose    Achilles tendinitis of left lower extremity    Acute suppurative arthritis due to bacteria (HCC) 03/09/2011   Overview:  Last Assessment & Plan:  Methicillin sensitive staphylococcal aureus septic hip. She clearly does need I&D of the hip. I will have her continue the doxycycline for now but stop it 7 days prior to her surgery to maximize the yield on the cultures in the operating room though I be shocked if we don't find methicillin sensitive staph aureus again.. I agree with removing as much of the pros   Anal fissure 11/30/2015   Arthritis    right hip   Atypical chest pain 04/09/2016   Bilateral edema of lower extremity    BMI 39.0-39.9,adult    Cancer [facility])    SKIN CANCER   Carpal tunnel syndrome 03/09/2011   Overview:  Last Assessment & Plan:  Clinically this seems to be carpal tunnel syndrome. Giving given her history of infection though we can keep in the back of our  mind the idea that she will have a cervical spine problem but I  think this is unlikely at this point in time medics carpal tunnel syndrome fits clinically this was a positive Tinel's sign however in for a brace for her.   Chronic vaginitis    Chronic venous insufficiency    Depression    Dyslipidemia    Fatigue 10/13/2015   Fibromyalgia    GERD without esophagitis    History of immune thrombocytopenia 03/09/2011   History of operative procedure on hip 03/10/2012   History of TTP (thrombotic thrombocytopenic purpura)    Hypokalemia    Lichen sclerosus et atrophicus of the vulva 11/30/2015   Major depressive disorder, recurrent, moderate (HCC)    Methicillin susceptible Staphylococcus aureus infection 03/09/2011   Overview:  Last Assessment & Plan:  This is undoubtedlythe culprit organism   Migraine    Morbid obesity with BMI of 45.0-49.9, adult (HCC) 10/13/2015   Morbidly obese (HCC)    MSSA (methicillin susceptible Staphylococcus aureus) infection    Near syncope    Osteoarthritis, chronic    Pain due to total hip replacement (HCC) 09/20/2011   Palpitations 03/04/2019   Postmenopausal    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 syndrome (HCC)    20 yrs ago-not seen anyone for 10 yrs.   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 Mild diverticulosis. Small internal hemorrhoids. Healed anal fissure   ENDOVENOUS ABLATION SAPHENOUS VEIN W/ LASER Right 04/02/2019   endovenous laser ablation right greater saphenous vein by Waverly Ferrari MD    JOINT REPLACEMENT  08/2009   right hip replacement   REVISION TOTAL HIP ARTHROPLASTY     TONSILLECTOMY  1963   TOTAL HIP REVISION  10/08/2011   Procedure: TOTAL HIP REVISION;  Surgeon: [name];  Location: [facility];   Service: Orthopedics;  Laterality: Right;  Resection of Right Total Hip/Extended Trochanteric Osteotomy/Placement of Antibiotic Total Hip Cemented by Depuy   TOTAL HIP REVISION  03/10/2012   Procedure: TOTAL HIP REVISION;  Surgeon: [name], MD;  Location: [facility];  Service: Orthopedics;  Laterality: Right;  Reimplantation/Revision of a Right Total Hip and Removal of Cemented Implant    SOCIAL HISTORY: Social History   Socioeconomic History   Marital status: Married    Spouse name: Raiford Noble   Number of children: Not on file   Years of education: 12+7   Highest education level: Bachelor's degree (e.g., BA, AB, BS)  Occupational History   Occupation: Magazine features editor: [employer]  Tobacco Use   Smoking status: Former    Current packs/day: 0.00    Average packs/day: 1.5 packs/day for 20.0 years (30.0 ttl pk-yrs)    Types: Cigarettes    Start date: 10/04/1970    Quit date: 10/04/1990    Years since quitting: 32.7   Smokeless tobacco: Former    Quit date: 03/09/1991  Vaping Use   Vaping status: Never Used  Substance and Sexual Activity   Alcohol use: Yes    Comment: OCCASIONAL   Drug use: No   Sexual activity: Yes    Partners: Male  Other Topics Concern   Not on file  Social History Narrative   Not on file   Social Determinants of Health   Financial Resource Strain: Not on file  Food Insecurity: Low Risk  (02/04/2023)  [facility]   Food vital sign    Within the past 12 months, you worried that your food would run out before you got money to buy more: Never true    Within the past 12 months, the food you bought just didn't last and you didn't have money to get more. : Never true  Transportation Needs: Not on file (02/04/2023)  Physical Activity: Not on file  Stress: Not on file  Social Connections: Not on file  Intimate Partner Violence: Not on file    FAMILY HISTORY Family History  Problem Relation Age of Onset    Hypertension Mother    Hypertension Father    Alzheimer's disease Father    Prostate cancer Father    Hypertension Maternal Grandfather    Seizures Other    Colon cancer Neg Hx    Stomach cancer Neg Hx    Rectal cancer Neg Hx    Esophageal cancer Neg Hx     ALLERGIES:  has No Known Allergies.  MEDICATIONS:  Current Outpatient Medications  Medication Sig Dispense Refill   triamterene-hydrochlorothiazide (DYAZIDE) 37.5-25 MG capsule Take 1 each (1 capsule total) by mouth daily. 30 capsule 1   aluminum-magnesium hydroxide 200-200 MG/5ML suspension Take 15 mLs by mouth every 6 (six) hours as needed for indigestion.     AMBULATORY NON FORMULARY MEDICATION Medication Name: Magic Mouth Wash 3 parts Mylanta/Maalox 2 parts Benadryl 1 part Viscous Lidocaine  Swish and Swallow 5 mL every 3-4 hours as needed (Patient not taking: Reported on 05/30/2023) 240 mL 0   buPROPion (WELLBUTRIN XL) 150 MG 24 hr tablet Take 150 mg by mouth every evening.     clobetasol cream (TEMOVATE) 0.05 % Apply 1 Application topically as needed.     clotrimazole-betamethasone (LOTRISONE) cream Apply 1 Application topically 2 (two) times daily.     divalproex (DEPAKOTE) 250 MG DR tablet Take 250 mg by mouth 2 (two) times daily.     DULoxetine (CYMBALTA) 60 MG capsule Take 60 mg by mouth at bedtime.      esomeprazole (NEXIUM) 40 MG capsule Take 40 mg by mouth daily. Pt states has been on for years     Multiple Vitamin (MULTIVITAMIN WITH MINERALS) TABS tablet Take 1 tablet by mouth daily.     nortriptyline (PAMELOR) 10 MG capsule Take 10 mg by mouth.     ondansetron (ZOFRAN) 8 MG tablet Take 1 tablet (8 mg total) by mouth 4 (four) times daily as needed for nausea or vomiting. 120 tablet 0   penicillin v potassium (VEETID) 500 MG tablet Take 1 tablet (500 mg total) by mouth 2 (two) times daily. 180 tablet 3   potassium chloride SA (KLOR-CON M) 20 MEQ tablet Take 3 tablet today, then start 1 tablet twice daily 64 tablet 0    SUMAtriptan (IMITREX) 25 MG tablet Take 25 mg by mouth.     No current facility-administered medications for this visit.    PHYSICAL EXAMINATION:  ECOG PERFORMANCE STATUS: 1 - Symptomatic but completely ambulatory   Vitals:   06/10/23 1628  BP: (!) 122/58  Pulse: 96  Resp: 18  Temp: 97.6 F (36.4 C)  SpO2: 98%      Filed Weights   06/10/23 1628  Weight: 215 lb 6.4 oz (97.7 kg)       Physical Exam Vitals and nursing note reviewed.  Constitutional:      General: She is not in acute distress.    Appearance: She is  obese. She is not ill-appearing, toxic-appearing or diaphoretic.     Comments: Here with husband.  Ill appearing  HENT:     Head: Normocephalic and atraumatic.     Right Ear: External ear normal.     Left Ear: External ear normal.     Nose: Nose normal. No congestion or rhinorrhea.     Mouth/Throat:     Mouth: Mucous membranes are dry.     Pharynx: No oropharyngeal exudate or posterior oropharyngeal erythema.  Eyes:     General: No scleral icterus.    Extraocular Movements: Extraocular movements intact.     Conjunctiva/sclera: Conjunctivae normal.     Pupils: Pupils are equal, round, and reactive to light.  Cardiovascular:     Rate and Rhythm: Normal rate.     Heart sounds: No murmur heard.    No friction rub. No gallop.  Abdominal:     General: Bowel sounds are normal.     Palpations: Abdomen is soft.     Tenderness: There is no abdominal tenderness. There is no guarding or rebound.  Musculoskeletal:        General: No swelling, tenderness or deformity.     Cervical back: Normal range of motion and neck supple. No rigidity or tenderness.     Right lower leg: No edema.     Left lower leg: No edema.  Lymphadenopathy:     Head:     Right side of head: No submental, submandibular, tonsillar, preauricular, posterior auricular or occipital adenopathy.     Left side of head: No submental, submandibular, tonsillar, preauricular, posterior auricular  or occipital adenopathy.     Cervical: No cervical adenopathy.     Right cervical: No superficial, deep or posterior cervical adenopathy.    Left cervical: No superficial, deep or posterior cervical adenopathy.     Upper Body:     Right upper body: No supraclavicular, axillary, pectoral or epitrochlear adenopathy.     Left upper body: No supraclavicular, axillary, pectoral or epitrochlear adenopathy.  Skin:    General: Skin is warm and dry.     Coloration: Skin is not jaundiced.  Neurological:     General: No focal deficit present.     Mental Status: She is alert and oriented to person, place, and time.     Cranial Nerves: No cranial nerve deficit.     Comments: Seated in wheelchair.  Falls asleep during interview but arousable  Psychiatric:     Comments: Forgetful.  Talkative.  Insight not good      LABORATORY DATA: I have personally reviewed the data as listed:  Clinical Support on 06/03/2023  Component Date Value Ref Range Status   WBC MORPHOLOGY 06/03/2023 MORPHOLOGY UNREMARKABLE   Final   RBC MORPHOLOGY 06/03/2023 ELLIPTOCYTES   Final   Comment: POLYCHROMASIA PRESENT TEARDROP CELLS    Clinical Information 06/03/2023 thrombocytopenia   Final   Performed at [facility], [address], [place], [place] [address]   C3 Complement 06/03/2023 96  82 - 167 mg/dL Final   Complement C4, Body Fluid 06/03/2023 32  12 - 38 mg/dL Final   Comment: (NOTE) Performed At: [facility] [address] [place], [place] [id number] Jolene Schimke MD XN:[id number]    PTT Lupus Anticoagulant 06/03/2023 35.7  0.0 - 43.5 sec Final   DRVVT 06/03/2023 38.5  0.0 - 47.0 sec Final   Lupus Anticoag Interp 06/03/2023 Comment:   Corrected   Comment: (NOTE) No lupus anticoagulant was detected. Performed  At: [facility] [address] [place], [place] [id number] Jolene Schimke MD [id number]:[id number]    Hepatitis B Surface Ag 06/03/2023 NON REACTIVE  NON REACTIVE Final    HCV Ab 06/03/2023 Reactive (A)  NON REACTIVE Final   Hep A IgM 06/03/2023 NON REACTIVE  NON REACTIVE Final   Hep B C IgM 06/03/2023 NON REACTIVE  NON REACTIVE Final   Performed at [facility], [address], [place], [place] [address]   Heparin Induced Plt Ab 06/03/2023 0.037  0.000 - 0.400 OD Final   Comment: (NOTE) Performed At: [facility] [address] [place], [place] [id number] Jolene Schimke MD [id number]:[id number]    Beta-2 Glyco I IgG 06/03/2023 <9  0 - 20 GPI IgG units Final   Comment: (NOTE) The reference interval reflects a 3SD or 99th percentile interval, which is thought to represent a potentially clinically significant result in accordance with the International Consensus Statement on the classification criteria for definitive antiphospholipid syndrome (APS). J Thromb Haem 2006;4:295-306.    Beta-2-Glycoprotein I IgM 06/03/2023 <9  0 - 32 GPI IgM units Final   Comment: (NOTE) The reference interval reflects a 3SD or 99th percentile interval, which is thought to represent a potentially clinically significant result in accordance with the International Consensus Statement on the classification criteria for definitive antiphospholipid syndrome (APS). J Thromb Haem 2006;4:295-306. Performed At: [facility] [address] [place], [place] [id number] Jolene Schimke MD KV:[id number]    Beta-2-Glycoprotein I IgA 06/03/2023 <9  0 - 25 GPI IgA units Final   Comment: (NOTE) The reference interval reflects a 3SD or 99th percentile interval, which is thought to represent a potentially clinically significant result in accordance with the International Consensus Statement on the classification criteria for definitive antiphospholipid syndrome (APS). J Thromb Haem 2006;4:295-306.    Anticardiolipin IgG 06/03/2023 <9  0 - 14 GPL U/mL Final   Comment: (NOTE)                          Negative:              <15                          Indeterminate:     15 -  20                          Low-Med Positive: >20 - 80                          High Positive:         >80    Anticardiolipin IgM 06/03/2023 9  0 - 12 MPL U/mL Final   Comment: (NOTE)                          Negative:              <13                          Indeterminate:     13 - 20                          Low-Med Positive: >20 - 80  High Positive:         >80 Performed At: [facility] [address] [place], [place] [id number] Jolene Schimke MD WU:[id number]    ds DNA Ab 06/03/2023 1  0 - 9 IU/mL Final   Comment: (NOTE)                                   Negative      <5                                   Equivocal  5 - 9                                   Positive      >9    Ribonucleic Protein 06/03/2023 <0.2  0.0 - 0.9 AI Final   ENA SM Ab Ser-aCnc 06/03/2023 <0.2  0.0 - 0.9 AI Final   Scleroderma (Scl-70) (ENA) Antibod* 06/03/2023 <0.2  0.0 - 0.9 AI Final   SSA (Ro) (ENA) Antibody, IgG 06/03/2023 <0.2  0.0 - 0.9 AI Final   SSB (La) (ENA) Antibody, IgG 06/03/2023 <0.2  0.0 - 0.9 AI Final   Chromatin Ab SerPl-aCnc 06/03/2023 0.2  0.0 - 0.9 AI Final   Anti JO-1 06/03/2023 <0.2  0.0 - 0.9 AI Final   Centromere Ab Screen 06/03/2023 <0.2  0.0 - 0.9 AI Final   See below: 06/03/2023 Comment   Final   Comment: (NOTE) Autoantibody                       Disease Association ------------------------------------------------------------                        Condition                  Frequency ---------------------   ------------------------   --------- Antinuclear Antibody,    SLE, mixed connective Direct (ANA-D)           tissue diseases ---------------------   ------------------------   --------- dsDNA                    SLE                        40 - 60% ---------------------   ------------------------   --------- Chromatin                Drug induced SLE                90%                         SLE                        48 -  97% ---------------------   ------------------------   --------- SSA (Ro)                 SLE                        25 - 35%  Sjogren's Syndrome         40 - 70%                         Neonatal Lupus                 100% ---------------------   ------------------------   --------- SSB (La)                 SLE                                                       10%                         Sjogren's Syndrome              30% ---------------------   -----------------------    --------- Sm (anti-Smith)          SLE                        15 - 30% ---------------------   -----------------------    --------- RNP                      Mixed Connective Tissue                         Disease                         95% (U1 nRNP,                SLE                        30 - 50% anti-ribonucleoprotein)  Polymyositis and/or                         Dermatomyositis                 20% ---------------------   ------------------------   --------- Scl-70 (antiDNA          Scleroderma (diffuse)      20 - 35% topoisomerase)           Crest                           13% ---------------------   ------------------------   --------- Jo-1                     Polymyositis and/or                         Dermatomyositis            20 - 40% ---------------------   ------------------------   --------- Centromere B             Scleroderma -                           Crest  variant                         80% Performed At: [facility] [address] [place], [place] [id number] Jolene Schimke MD [id number]:[id number]    Retic Ct Pct 06/03/2023 5.8 (H)  0.4 - 3.1 % Final   RBC. 06/03/2023 2.98 (L)  3.87 - 5.11 MIL/uL Final   Retic Count, Absolute 06/03/2023 173.1  19.0 - 186.0 K/uL Final   Immature Retic Fract 06/03/2023 21.2 (H)  2.3 - 15.9 % Final   Performed at [facility], [address], [place], [place] [address]   Haptoglobin  06/03/2023 <10 (L)  37 - 355 mg/dL Final   Comment: (NOTE) Performed At: [facility] [address] [place], [place] [id number] Jolene Schimke MD [id number]    HCV Ab 06/03/2023 Reactive (A)  Non Reactive Final   Comment: (NOTE) Performed At: [facility] [address] [place], [place] [id number] Jolene Schimke MD [id number]    Hepatitis C Quantitation 06/03/2023 HCV Not Detected  IU/mL Final   Test Information (HCV): 06/03/2023 Comment   Final   Comment: (NOTE) The quantitative range of this assay is 15 IU/mL to 100 million IU/mL.    Interpretation (HCV): 06/03/2023 Comment   Final   Comment: (NOTE) Positive HCV antibody screen without the presence of HCV RNA is consistent with a resolved past infection or a false positive HCV antibody. Consider repeat testing after one month. Performed At: [facility] [address] [place], [place] [id number] Jolene Schimke MD [id number]   Office Visit on 06/03/2023  Component Date Value Ref Range Status   WBC 06/03/2023 3.3 (L)  4.0 - 10.5 K/uL Final   RBC 06/03/2023 2.99 (L)  3.87 - 5.11 MIL/uL Final   Hemoglobin 06/03/2023 9.7 (L)  12.0 - 15.0 g/dL Final   HCT 63/10/6008 30.4 (L)  36.0 - 46.0 % Final   MCV 06/03/2023 101.7 (H)  80.0 - 100.0 fL Final   MCH 06/03/2023 32.4  26.0 - 34.0 pg Final   MCHC 06/03/2023 31.9  30.0 - 36.0 g/dL Final   RDW 93/23/5573 21.6 (H)  11.5 - 15.5 % Final   Platelets 06/03/2023 75 (L)  150 - 400 K/uL Final   Comment: SPECIMEN CHECKED FOR CLOTS Immature Platelet Fraction may be clinically indicated, consider ordering this additional test [id number] REPEATED TO VERIFY PLATELET COUNT CONFIRMED BY SMEAR    nRBC 06/03/2023 0.0  0.0 - 0.2 % Final   Neutrophils Relative % 06/03/2023 47  % Final   Neutro Abs 06/03/2023 1.5 (L)  1.7 - 7.7 K/uL Final   Lymphocytes Relative 06/03/2023 27  % Final   Lymphs Abs 06/03/2023 0.9  0.7 - 4.0 K/uL Final   Monocytes Relative  06/03/2023 24  % Final   Monocytes Absolute 06/03/2023 0.8  0.1 - 1.0 K/uL Final   Eosinophils Relative 06/03/2023 1  % Final   Eosinophils Absolute 06/03/2023 0.0  0.0 - 0.5 K/uL Final   Basophils Relative 06/03/2023 0  % Final   Basophils Absolute 06/03/2023 0.0  0.0 - 0.1 K/uL Final   Immature Granulocytes 06/03/2023 1  % Final   Abs Immature Granulocytes 06/03/2023 0.04  0.00 - 0.07 K/uL Final   Ovalocytes 06/03/2023 PRESENT   Final   Performed at [facility], [address] [place], [place] [address]  Appointment on 05/29/2023  Component Date Value Ref Range Status   LDH 05/29/2023 255 (H)  98 - 192 U/L Final   Performed at [facility], [address] [place], [place] [address]   WBC Count 05/29/2023 2.3 (L)  4.0 - 10.5 K/uL Final   RBC 05/29/2023 3.07 (L)  3.87 - 5.11 MIL/uL Final   Hemoglobin 05/29/2023 10.1 (L)  12.0 - 15.0 g/dL Final   HCT 60/45/4098 30.4 (L)  36.0 - 46.0 % Final   MCV 05/29/2023 99.0  80.0 - 100.0 fL Final   MCH 05/29/2023 32.9  26.0 - 34.0 pg Final   MCHC 05/29/2023 33.2  30.0 - 36.0 g/dL Final   RDW 11/91/4782 22.2 (H)  11.5 - 15.5 % Final   Platelet Count 05/29/2023 84 (L)  150 - 400 K/uL Final   Comment: SPECIMEN CHECKED FOR CLOTS Immature Platelet Fraction may be clinically indicated, consider ordering this additional test [id number] REPEATED TO VERIFY PLATELET COUNT CONFIRMED BY SMEAR    nRBC 05/29/2023 0.0  0.0 - 0.2 % Final   Neutrophils Relative % 05/29/2023 66  % Final   Neutro Abs 05/29/2023 1.5 (L)  1.7 - 7.7 K/uL Final   Lymphocytes Relative 05/29/2023 18  % Final   Lymphs Abs 05/29/2023 0.4 (L)  0.7 - 4.0 K/uL Final   Monocytes Relative 05/29/2023 13  % Final   Monocytes Absolute 05/29/2023 0.3  0.1 - 1.0 K/uL Final   Eosinophils Relative 05/29/2023 0  % Final   Eosinophils Absolute 05/29/2023 0.0  0.0 - 0.5 K/uL Final   Basophils Relative 05/29/2023 1  % Final   Basophils Absolute 05/29/2023 0.0   0.0 - 0.1 K/uL Final   WBC Morphology 05/29/2023 TOXIC GRANULATION   Final   Smear Review 05/29/2023 MORPHOLOGY UNREMARKABLE   Final   Immature Granulocytes 05/29/2023 2  % Final   Abs Immature Granulocytes 05/29/2023 0.04  0.00 - 0.07 K/uL Final   Ovalocytes 05/29/2023 PRESENT   Final   Performed at [facility], [address], [place], [place] [address]   Haptoglobin 05/29/2023 <10 (L)  37 - 355 mg/dL Final   Comment: (NOTE) Performed At: [facility] [address] [place], [place] [id number] Jolene Schimke MD [id number]    DAT, complement 05/29/2023 NEG   Final   DAT, IgG 05/29/2023    Final                   Value:NEG Performed at [facility], [address] [place], [place] [address]   Abstract on 05/21/2023  Component Date Value Ref Range Status   Glucose 05/21/2023 96   Final   BUN 05/21/2023 15  4 - 21 Final   CO2 05/21/2023 27 (A)  13 - 22 Final   Creatinine 05/21/2023 0.9  0.5 - 1.1 Final   Potassium 05/21/2023 3.4 (A)  3.5 - 5.1 mEq/L Final   Sodium 05/21/2023 135 (A)  137 - 147 Final   Chloride 05/21/2023 100  99 - 108 Final   Calcium 05/21/2023 9.5  8.7 - 10.7 Final   Albumin 05/21/2023 3.8  3.5 - 5.0 Final   Alkaline Phosphatase 05/21/2023 76  25 - 125 Final   ALT 05/21/2023 17  7 - 35 U/L Final   AST 05/21/2023 38 (A)  13 - 35 Final   Bilirubin, Total 05/21/2023 1.1   Final   Magnesium 05/21/2023 1.70   Final  Abstract on 05/21/2023  Component Date Value Ref Range Status   Hemoglobin 05/21/2023 10.4 (A)  12.0 - 16.0 Final  HCT 05/21/2023 30 (A)  36 - 46 Final   Neutrophils Absolute 05/21/2023 1.75   Final   Platelets 05/21/2023 50 (A)  150 - 400 K/uL Final   WBC 05/21/2023 3.8   Final   RBC 05/21/2023 3.28 (A)  3.87 - 5.11 Final  Appointment on 05/21/2023  Component Date Value Ref Range Status   DPD 5-Fluorouracil Result: 05/21/2023 See Scanned report in [facility] Health Link   Final   DPD 5-Fluorouracil  Director Review: 05/21/2023 See Scanned report in [place] Link   Final   Performed at [facility], [address], [place], [place] [address]   CBC with Differential 05/21/2023 Sent to [facility] for testing. See scanned report.   Final   Performed at [facility], [address], [place], [place] [address]   Comprehensive Metabolic Panel 05/21/2023 Sent to [facility] for testing. See scanned report.   Final   Performed at [facility], [address], [place], [place] [address]  Abstract on 05/13/2023  Component Date Value Ref Range Status   Hemoglobin 05/13/2023 10.8 (A)  12.0 - 16.0 Final   HCT 05/13/2023 31 (A)  36 - 46 Final   Neutrophils Absolute 05/13/2023 2.84   Final   Platelets 05/13/2023 91 (A)  150 - 400 K/uL Final   WBC 05/13/2023 3.5   Final   RBC 05/13/2023 3.43 (A)  3.87 - 5.11 Final   Glucose 05/13/2023 148   Final   BUN 05/13/2023 9  4 - 21 Final   CO2 05/13/2023 29 (A)  13 - 22 Final   Creatinine 05/13/2023 0.6  0.5 - 1.1 Final   Potassium 05/13/2023 3.3 (A)  3.5 - 5.1 mEq/L Final   Sodium 05/13/2023 137  137 - 147 Final   Chloride 05/13/2023 102  99 - 108 Final   Calcium 05/13/2023 9.1  8.7 - 10.7 Final   Albumin 05/13/2023 3.6  3.5 - 5.0 Final   Alkaline Phosphatase 05/13/2023 82  25 - 125 Final   ALT 05/13/2023 18  7 - 35 U/L Final   AST 05/13/2023 38 (A)  13 - 35 Final   Bilirubin, Total 05/13/2023 1.1   Final   Magnesium 05/13/2023 1.90   Final    RADIOGRAPHIC STUDIES: I have personally reviewed the radiological images as listed and agree with the findings in the report  No results found.  ASSESSMENT/PLAN  67 y.o. female is here because of  colon cancer.  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 Mass was partially circumferential (involves < 1/3rd of the lumen circumference). 10 mm polyp in proximal ascending colon which was sessile.  Pathology ulcerated mass- invasive moderately differentiated adenocarcinoma arising within a tubular adenoma with high-grade dysplasia   January 15 2023- CEA 2.9 Chromogranin A 564  January 17 2023- CT CAP  Short segment asymmetric wall thickening of the transverse colon measuring 3.3 cm in length.   Small adjacent soft tissue nodule/lymph nodes adjacent to the colonic wall thickening measuring 6 mm, suspicious  for local nodal disease. Prominent/mildly enlarged right upper quadrant lymph nodes similar to January 26, 2019 and favored reactive.  No distant metastatic disease.  Hepatomegaly with nodular hepatic contour, suggestive of cirrhosis Cholelithiasis without findings of acute cholecystitis.   Incidental right thyroid nodule   January 23 2023- Chromogranin A 294  February 04 2023:  Transverse colectomy.  Pathology showed adenocarcinoma invading visceral peritoneum, Grade 3.  All of 30 lymph nodes negative for tumor.  MSI intact Feb 25 2023- On basis of spontaneous improvement in chromogranin A will hold on Dodatate PET.     Therapeutics-  Feb 25 2023- Reviewed NCCN treatment guidelines with patient and husband.  They are agreeable to proceed with adjuvant CAPOX x 6 months.   Mar 11 2023- Cycle 1 XelOx Mar 20 2023- Tolerated cycle 1 well with only some mild cold neuropathy April 01 2023- Cycle 2 XelOx   April 22 2023: Cycle 3 XelOx.              April 30 2023- Experiencing mucositis, anorexia, nausea and diarrhea.    May 13 2023: Cycle 4 XelOX.  Dose reduced Xeloda from 2000 mg bid to 1500 mg bid due to GI symptoms.  This will be her last cycle of adjuvant therapy  May 23 2023-  Stopped  Xeloda due to side effects and referried for admission.  Contacted ED attending   Chemotherapy induced nausea and diarrhea             April 30 2023-  Instructed patient to do the following-- Take Ondansetron 8 mg, four times daily.  Take Imodium 4 mg every 4 hrs for diarrhea Use the magic mouthwash every 4 hours while awake.  Arranged for patient to receive IVF and antiemetics.  To follow up in 2 days to assess progress     May 02 2023- To complete this course of Xeloda on July 15th.  Should improve with the week off between then and beginning of Cycle 4.  Anticipate dose reduction in Xeloda with Cycle 4.  Sx under better control today than at last visit due to better use of supportive care May 08 2023-  Continues to have 5 to 6 bowel movements daily.  Using imodium 2 mg q 5 hrs.  Instructed patient to use imodium 4 mg po q 4 hrs PRN diarrhea Adding lomotil up to qid PRN.  Has required potassium replacement due to diarrheal losses May 21 2023- Continues to have significant chemotherapy induced diarrhea.  Not using supportive care medications to maximum potential. Provided written instructions to patient and husband again today.  Adding Levaquin 500 mg daily.  Sending stool for C diff.  Referring to infusion for IVF.  Chemistries sent to guide electrolyte replacement.  Follow up in 2 days.   Sent testing to evaluate for DPD deficiency.  Discussed plans with clinical pharmacist.   May 23 2023- Diarrhea improved with use of scheduled imodium, lomotil, ondansetron and since starting Levaquin daily.  Remains dehydrated as judged by exam (mucosa and skin dry, tachycardic)  May 30 2023- Diarrhea persists. Starting empiric Levaquin/Flagyl for diverticulitis/gastroenteritis  Mental status changes May 23 2023- Likely multifactorial- 1) psychotropic medications (welbutrin, cymbalta, nortryptiline, depakote)  2) supportive care medications (Flexeril, lomotil, imodium, ondansetron)  3) chemotherapy  (Xeloda) 4) Dehydration 5) underlying liver disease  Recommended admission via ED for evaluation and management.  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  Reviewed medication list to remove non-essential agents; particularly those with CNS side effects  Microangiopathic hemolytic anemia             May 25 2023- Haptoglobin < 30.  GI pathogen panel negative negative for Shiga toxin and bacteria known to produce Shiga Toxin              May 30 2023- Laboratory evaluation notable for Hgb 10.1 PLT 84, LDH 255.  DAT negative.  Haptoglobin < 10.  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  Arranging for treatment with Ultomiris.  Arranged for immunization against meningococcus and begin PCN prophylaxis June 05 2023- Meningococcal vaccine June 06 2023- Ultomiris load June 10 2023- GI symptoms improving.  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  At that time TTP and HUS were distinguishable only on clinical grounds and were thought of as being almost the same disease.  The entity of atypical HUS was not known at the time.  It is therefore possible that patient had atypical HUS at that time which responded to plasma  exchange   Diarrhea:             January 15 2023- Not explained by the colon cancer.  No evidence of inflammatory bowel disease by colonoscopy.    Chromogranin A 564  January 23 2023- Repeat Chromogranin A improved  Feb 25 2023- Diarrhea and chromogranin A improved therefore holding on Dotatate PET/CT    Poor venous access:    Mar 11 2023- Port placement   Headaches Mar 07 2023- Improved following surgery likely related to a component of general anesthesia.  To see neurology  Possible cirrhosis  January 17 2023- Nodular liver contour noted on CT CAP    Cancer Staging  Colon cancer [facility]) Staging form: Colon and Rectum, AJCC 8th Edition - Clinical stage from 02/25/2023: Stage IIB (cT4a, cN0, cM0) - Signed by Loni Muse, MD on 02/25/2023 Histopathologic type: Adenocarcinoma, NOS Stage prefix: Initial diagnosis Total positive nodes: 0 Total nodes examined: 30 Histologic grade (G): G3 Histologic grading system: 4 grade system    No problem-specific Assessment & Plan notes found for this encounter.    No orders of the defined types were placed in this encounter.   40  minutes was spent in patient care.  This included time spent preparing to see the patient (e.g., review of tests), obtaining and/or reviewing separately obtained history, counseling and educating the patient/family/caregiver, ordering medications, tests, or procedures; documenting clinical information in the electronic or other health record, independently interpreting results and communicating results to the patient/family/caregiver as well as coordination of care.       All questions were answered. The patient knows to call the clinic with any problems, questions or concerns.  This note was electronically signed.  Loni Muse, MD  06/10/2023 4:44 PM

## 2023-06-11 ENCOUNTER — Other Ambulatory Visit: Payer: Medicare PPO

## 2023-06-11 ENCOUNTER — Ambulatory Visit: Payer: Medicare PPO | Admitting: Dietician

## 2023-06-11 ENCOUNTER — Other Ambulatory Visit: Payer: Self-pay

## 2023-06-11 ENCOUNTER — Telehealth: Payer: Self-pay | Admitting: Dietician

## 2023-06-11 NOTE — Progress Notes (Signed)
NUTRITION FOLLOW UP: I tried to get patient to pick up call or return call. Patient returned text stating she is still trying to get to a healthy weight and she is attempting to eat better and forcing as much protein at one meal with 2 high protein shakes for other meals.  When I warned her to try not to lose more than 1#/week she texted back "That's what the doc said but I don't know how to argue with my brain."  Anthropometrics: 3# loss past week  Height:  Weight: 06/10/23  215.4# 06/03/23  218.4# 06/11/23 220#  05/23/23  215.7# 05/21/23  214.3# 05/13/23 225# 04/22/23  244# UBW: 244-250 past year BMI: 34.87   Estimated Energy Needs  Kcals: 2500-3000 Protein: 119-149 Fluid: 3 L  NUTRITION DIAGNOSIS: Inadequate PO intake to meet increased nutrient needs, r/t cancer diagnosis and NIS of treatments. Continues   INTERVENTION:  Encouraged weight maintenance or no more than 1# per week loss Encouraged at least 3 meals a day with 20-30 grams of protein at each meal  MONITORING, EVALUATION, GOAL: weight, PO intake, Nutrition Impact Symptoms, labs Goal is weight maintenance  Next Visit: At patient or provider request  Gennaro Africa, RDN, LDN Registered Dietitian, [facility] Part Time Remote (Usual office hours: Tuesday-Thursday) Cell: [phone]

## 2023-06-11 NOTE — Telephone Encounter (Signed)
Attempted to reach patient for a scheduled remote nutrition consult. There was no answer at home#, and mail box was full on mobile. Sent text to mobile to return call for follow up nutrition consult.  Gennaro Africa, RDN, LDN Registered Dietitian, [facility] Part Time Remote (Usual office hours: Tuesday-Thursday) Cell: [phone]

## 2023-06-12 ENCOUNTER — Other Ambulatory Visit: Payer: Self-pay

## 2023-06-12 ENCOUNTER — Inpatient Hospital Stay: Payer: Medicare PPO

## 2023-06-12 DIAGNOSIS — C184 Malignant neoplasm of transverse colon: Secondary | ICD-10-CM | POA: Diagnosis not present

## 2023-06-12 DIAGNOSIS — E860 Dehydration: Secondary | ICD-10-CM | POA: Diagnosis not present

## 2023-06-12 DIAGNOSIS — E876 Hypokalemia: Secondary | ICD-10-CM | POA: Diagnosis not present

## 2023-06-12 DIAGNOSIS — D594 Other nonautoimmune hemolytic anemias: Secondary | ICD-10-CM

## 2023-06-12 DIAGNOSIS — Z5111 Encounter for antineoplastic chemotherapy: Secondary | ICD-10-CM | POA: Diagnosis not present

## 2023-06-12 DIAGNOSIS — Z79899 Other long term (current) drug therapy: Secondary | ICD-10-CM | POA: Diagnosis not present

## 2023-06-12 DIAGNOSIS — Z23 Encounter for immunization: Secondary | ICD-10-CM | POA: Diagnosis not present

## 2023-06-12 DIAGNOSIS — D6959 Other secondary thrombocytopenia: Secondary | ICD-10-CM | POA: Diagnosis not present

## 2023-06-12 DIAGNOSIS — D649 Anemia, unspecified: Secondary | ICD-10-CM | POA: Diagnosis not present

## 2023-06-12 LAB — RETICULOCYTES
Immature Retic Fract: 10 % (ref 2.3–15.9)
RBC.: 3.03 MIL/uL — ABNORMAL LOW (ref 3.87–5.11)
Retic Count, Absolute: 117.6 10*3/uL (ref 19.0–186.0)
Retic Ct Pct: 3.9 % — ABNORMAL HIGH (ref 0.4–3.1)

## 2023-06-12 LAB — CBC WITH DIFFERENTIAL (CANCER CENTER ONLY)
Abs Immature Granulocytes: 0.02 10*3/uL (ref 0.00–0.07)
Basophils Absolute: 0 10*3/uL (ref 0.0–0.1)
Basophils Relative: 1 %
Eosinophils Absolute: 0 10*3/uL (ref 0.0–0.5)
Eosinophils Relative: 1 %
HCT: 31.6 % — ABNORMAL LOW (ref 36.0–46.0)
Hemoglobin: 10.1 g/dL — ABNORMAL LOW (ref 12.0–15.0)
Immature Granulocytes: 1 %
Lymphocytes Relative: 24 %
Lymphs Abs: 0.8 10*3/uL (ref 0.7–4.0)
MCH: 33.4 pg (ref 26.0–34.0)
MCHC: 32 g/dL (ref 30.0–36.0)
MCV: 104.6 fL — ABNORMAL HIGH (ref 80.0–100.0)
Monocytes Absolute: 0.6 10*3/uL (ref 0.1–1.0)
Monocytes Relative: 19 %
Neutro Abs: 1.8 10*3/uL (ref 1.7–7.7)
Neutrophils Relative %: 54 %
Platelet Count: 70 10*3/uL — ABNORMAL LOW (ref 150–400)
RBC: 3.02 MIL/uL — ABNORMAL LOW (ref 3.87–5.11)
RDW: 18.9 % — ABNORMAL HIGH (ref 11.5–15.5)
WBC Count: 3.3 10*3/uL — ABNORMAL LOW (ref 4.0–10.5)
nRBC: 0 % (ref 0.0–0.2)

## 2023-06-12 LAB — CMP (CANCER CENTER ONLY)
ALT: 12 U/L (ref 0–44)
AST: 25 U/L (ref 15–41)
Albumin: 3.4 g/dL — ABNORMAL LOW (ref 3.5–5.0)
Alkaline Phosphatase: 54 U/L (ref 38–126)
Anion gap: 8 (ref 5–15)
BUN: 13 mg/dL (ref 8–23)
CO2: 27 mmol/L (ref 22–32)
Calcium: 8.8 mg/dL — ABNORMAL LOW (ref 8.9–10.3)
Chloride: 101 mmol/L (ref 98–111)
Creatinine: 0.97 mg/dL (ref 0.44–1.00)
GFR, Estimated: >60 mL/min (ref >60)
Glucose, Bld: 95 mg/dL (ref 70–99)
Potassium: 4.1 mmol/L (ref 3.5–5.1)
Sodium: 136 mmol/L (ref 135–145)
Total Bilirubin: 0.6 mg/dL (ref 0.3–1.2)
Total Protein: 6.2 g/dL — ABNORMAL LOW (ref 6.5–8.1)

## 2023-06-12 LAB — LACTATE DEHYDROGENASE: LDH: 182 U/L (ref 98–192)

## 2023-06-13 ENCOUNTER — Other Ambulatory Visit: Payer: Self-pay

## 2023-06-13 LAB — HAPTOGLOBIN: Haptoglobin: 27 mg/dL — ABNORMAL LOW (ref 37–355)

## 2023-06-17 ENCOUNTER — Inpatient Hospital Stay: Payer: Medicare PPO | Admitting: Oncology

## 2023-06-17 ENCOUNTER — Encounter: Payer: Self-pay | Admitting: Oncology

## 2023-06-17 ENCOUNTER — Other Ambulatory Visit: Payer: Self-pay | Admitting: Pharmacist

## 2023-06-17 ENCOUNTER — Inpatient Hospital Stay: Payer: Medicare PPO

## 2023-06-17 VITALS — BP 99/60 | HR 98 | Temp 97.7°F | Resp 16 | Ht 65.9 in | Wt 204.8 lb

## 2023-06-17 DIAGNOSIS — D5939 Other hemolytic-uremic syndrome: Secondary | ICD-10-CM

## 2023-06-17 DIAGNOSIS — M311 Thrombotic microangiopathy, unspecified: Secondary | ICD-10-CM

## 2023-06-17 DIAGNOSIS — Z5111 Encounter for antineoplastic chemotherapy: Secondary | ICD-10-CM | POA: Diagnosis not present

## 2023-06-17 DIAGNOSIS — Z09 Encounter for follow-up examination after completed treatment for conditions other than malignant neoplasm: Secondary | ICD-10-CM

## 2023-06-17 DIAGNOSIS — D6959 Other secondary thrombocytopenia: Secondary | ICD-10-CM | POA: Diagnosis not present

## 2023-06-17 DIAGNOSIS — E876 Hypokalemia: Secondary | ICD-10-CM | POA: Diagnosis not present

## 2023-06-17 DIAGNOSIS — D649 Anemia, unspecified: Secondary | ICD-10-CM | POA: Diagnosis not present

## 2023-06-17 DIAGNOSIS — D594 Other nonautoimmune hemolytic anemias: Secondary | ICD-10-CM | POA: Diagnosis not present

## 2023-06-17 DIAGNOSIS — F05 Delirium due to known physiological condition: Secondary | ICD-10-CM | POA: Diagnosis not present

## 2023-06-17 DIAGNOSIS — Z79899 Other long term (current) drug therapy: Secondary | ICD-10-CM | POA: Diagnosis not present

## 2023-06-17 DIAGNOSIS — E860 Dehydration: Secondary | ICD-10-CM | POA: Diagnosis not present

## 2023-06-17 DIAGNOSIS — Z862 Personal history of diseases of the blood and blood-forming organs and certain disorders involving the immune mechanism: Secondary | ICD-10-CM

## 2023-06-17 DIAGNOSIS — C184 Malignant neoplasm of transverse colon: Secondary | ICD-10-CM | POA: Diagnosis not present

## 2023-06-17 DIAGNOSIS — Z23 Encounter for immunization: Secondary | ICD-10-CM | POA: Diagnosis not present

## 2023-06-17 LAB — CBC WITH DIFFERENTIAL/PLATELET
Abs Immature Granulocytes: 0.01 10*3/uL (ref 0.00–0.07)
Basophils Absolute: 0 10*3/uL (ref 0.0–0.1)
Basophils Relative: 1 %
Eosinophils Absolute: 0 10*3/uL (ref 0.0–0.5)
Eosinophils Relative: 1 %
HCT: 31.7 % — ABNORMAL LOW (ref 36.0–46.0)
Hemoglobin: 10.1 g/dL — ABNORMAL LOW (ref 12.0–15.0)
Immature Granulocytes: 0 %
Lymphocytes Relative: 28 %
Lymphs Abs: 1 10*3/uL (ref 0.7–4.0)
MCH: 33.2 pg (ref 26.0–34.0)
MCHC: 31.9 g/dL (ref 30.0–36.0)
MCV: 104.3 fL — ABNORMAL HIGH (ref 80.0–100.0)
Monocytes Absolute: 0.7 10*3/uL (ref 0.1–1.0)
Monocytes Relative: 22 %
Neutro Abs: 1.6 10*3/uL — ABNORMAL LOW (ref 1.7–7.7)
Neutrophils Relative %: 48 %
Platelets: 74 10*3/uL — ABNORMAL LOW (ref 150–400)
RBC: 3.04 MIL/uL — ABNORMAL LOW (ref 3.87–5.11)
RDW: 17.2 % — ABNORMAL HIGH (ref 11.5–15.5)
WBC: 3.4 10*3/uL — ABNORMAL LOW (ref 4.0–10.5)
nRBC: 0 % (ref 0.0–0.2)

## 2023-06-17 LAB — LACTATE DEHYDROGENASE: LDH: 159 U/L (ref 98–192)

## 2023-06-17 LAB — COMPREHENSIVE METABOLIC PANEL
ALT: 9 U/L (ref 0–44)
AST: 23 U/L (ref 15–41)
Albumin: 3.6 g/dL (ref 3.5–5.0)
Alkaline Phosphatase: 49 U/L (ref 38–126)
Anion gap: 8 (ref 5–15)
BUN: 11 mg/dL (ref 8–23)
CO2: 29 mmol/L (ref 22–32)
Calcium: 9.4 mg/dL (ref 8.9–10.3)
Chloride: 101 mmol/L (ref 98–111)
Creatinine, Ser: 1.26 mg/dL — ABNORMAL HIGH (ref 0.44–1.00)
GFR, Estimated: 47 mL/min — ABNORMAL LOW (ref >60)
Glucose, Bld: 91 mg/dL (ref 70–99)
Potassium: 4.4 mmol/L (ref 3.5–5.1)
Sodium: 138 mmol/L (ref 135–145)
Total Bilirubin: 0.7 mg/dL (ref 0.3–1.2)
Total Protein: 6.3 g/dL — ABNORMAL LOW (ref 6.5–8.1)

## 2023-06-17 LAB — RETICULOCYTES
Immature Retic Fract: 8.7 % (ref 2.3–15.9)
RBC.: 3 MIL/uL — ABNORMAL LOW (ref 3.87–5.11)
Retic Count, Absolute: 78.9 10*3/uL (ref 19.0–186.0)
Retic Ct Pct: 2.6 % (ref 0.4–3.1)

## 2023-06-17 NOTE — Progress Notes (Signed)
[facility] Cancer Initial Visit:  Patient Care Team: Hurshel Party, NP as PCP - General (Internal Medicine) Loni Muse, MD as Consulting Physician (Internal Medicine)  CHIEF COMPLAINTS/PURPOSE OF CONSULTATION:  Oncology History  Colon cancer [facility])  01/15/2023 Initial Diagnosis   Colon cancer (HCC)   02/25/2023 Cancer Staging   Staging form: Colon and Rectum, AJCC 8th Edition - Clinical stage from 02/25/2023: Stage IIB (cT4a, cN0, cM0) - Signed by Loni Muse, MD on 02/25/2023 Histopathologic type: Adenocarcinoma, NOS Stage prefix: Initial diagnosis Total positive nodes: 0 Total nodes examined: 30 Histologic grade (G): G3 Histologic grading system: 4 grade system   03/11/2023 - 05/13/2023 Chemotherapy   Patient is on Treatment Plan : COLORECTAL Xelox (Capeox)(130/850) q21d       HISTORY OF PRESENTING ILLNESS: [name] 67 y.o. female is here because of  colon cancer Medical history notable for septic arthritis of the hip, obesity, atypical chest pain, skin cancer, carpal tunnel syndrome, chronic vaginitis, chronic venous and sufficiency, TTP, syncope  January 10, 2023: Colonoscopy-ulcerated 2 cm nonobstructing small mass with heaped up margins found in distal transverse colon.  Mass was partially circumferential (involves less than one third of the lumen circumference) no bleeding present.  10 mm polyp in proximal ascending colon which was sessile.  Pathology-Ascending colon polyp was compatible with a sessile serrated adenoma without cytologic dysplasia Transverse colon polyp-invasive moderately differentiated adenocarcinoma arising within a tubular adenoma with high-grade dysplasia  January 15 2023: [facility] Medical Oncology Consult Patient reports that the colonoscopy was performed for surveillance; however, she was also having episodes of intermittent diarrhea x 2 to 3 months.  Stools were not bloody, nor was she passing mucus but they were soft.  In  association with these episodes she would also feel dizzy and note an odd smell, nausea.   To see Dr. Logan Bores from surgery on January 22 2023   Social:  Married.  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  Tobacco quit 24 years.  Rare glass of wine  [facility] Mother alive 87 epileptic, dementia Father died 68 Alzheimers, prostate cancer Brother alive 74 arthritis requiring joint replacement,   WBC 6.4 hemoglobin 12.5 MCV 88 platelet count 178 normal differential CMP normal CEA 2.9 chromogranin A 564  January 17 2023:  CT CAP Short segment asymmetric wall thickening of the transverse colon measuring 3.3 cm in length, compatible with reported history of  primary colonic neoplasm.  Small adjacent soft tissue nodule/lymph nodes adjacent to the colonic wall thickening measuring 6 mm, nonspecific are suspicious  for local nodal disease. Prominent/mildly enlarged right upper quadrant lymph nodes are similar dating back to January 26, 2019 and favored reactive.  No convincing evidence of distant metastatic disease within the chest, abdomen or pelvis.  Hepatomegaly with nodular hepatic contour, suggestive of cirrhosis Cholelithiasis without findings of acute cholecystitis.   Incidental right thyroid nodule measuring   January 18 2023:  Presented to [facility] ED with headaches and chest discomfort.  CT PA negative for PE.  CT head without contrast negative.    January 22 2023:  Scheduled follow up for colon cancer.  Reviewed results of labs and imaging with patient and husband. Will repeat chromgranin A and refer for PET/CT if chromogranin A still significantly elevated.    January 23, 2023: Chromogranin A 294  February 04 2023:  Transverse colectomy Pathology showed adenocarcinoma invading visceral peritoneum, Grade 3.  All of 30 lymph nodes were negative for tumor.  Surgical Stage (T4a, N0 M0)  MSI intact  Feb 25, 2023:   Reviewed results of surgical pathology with patient and husband.  Per NCCN guidelines adjuvant  options are CAPEOX, FOLFOX 6 months, Capecitabine or observation.  Discussed risks and benefits of each  Mar 07 2023:   Bifrontal headaches have returned.  These began in October 2023 but resolved after her colon surgery.  Has seen neurology and underwent MRI brain negative for CNS lesion.  EEG normal.   (Raises the question of what medications in perioperative period helped with the HA's.)  Recovering from a dog bite on dorsum of right hand.  Will receive Capecitabine today.    Mar 11 2023:  Port placement.  Cycle 1 XelOx   Mar 19 2023:  Neurology follow up.    Mar 20 2023:   Agree with Neurology assessment that neurocognitive testing should not be performed it at all, until well after chemotherapy has been completed.  Experienced some mild nausea helped by antiemetics.   Has occasional HA's.  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   No HFS.     April 01 2023:  Cycle 2 XelOx  April 17 2023:  Has lost 4 lbs.  Appetite diminished due to dysgeusia.  No metallic taste.  Edges of tongue get red and sore and painful.  Mouth feels dry despite drinking a lot of water and tea.  Not using anything for her mouth sores.   Fatigue increased.  Has cold neuropathy and cold induced pharyngospasm.      Begin Magic mouthwash for mucositis.     April 22 2023:  Cycle 3 XelOx.    April 30 2023:    Patient called office yesterday stating that she was anorectic, nauseated, having mouth sores and diarrhea.  Has lost 13 lbs.  Has difficulty remembering details which complicates history taking.  States that anorexia began after starting this round of chemotherapy.  "Food does not get past my tongue"; did not recognize this was nausea so did not begin taking antiemetics until 3 days ago.  Only taking antiemetics bid.  Mouth feels dry but not painful.  No emesis.  Began with diarrhea in the form of 5 to 6 stools per day but does not believe that she is taking imodium for it.  Dysgeusia  even to water.   Not using magic mouthwash regularly  May 02 2023:    Diarrhea improved with imodium.  Using ondansetron alternating with compazine; no emesis with improvement in nausea.  Eating limited by mucositis which she can't say is helped by magic mouthwash.  Not eating cold foods.  Has gained 2 lbs in the last two days.  To complete the course of Xeloda on May 06 2023.    WBC 4.7 hemoglobin 11.6 platelet count 61; 60 segs 23 lymphs 13 monos 4 eos CMP notable for potassium 3.4 glucose 103  May 08 2023:   Scheduled follow-up regarding colon cancer. Has lost 4 lbs since last visit owing to anorexia.   No mouth sores but has dysgeusia and mouth feels dry.  Using magic mouthwash.  Nauseated but without emesis.  Continues to have 5 to 6 bowel movements daily.  Using imodium 2 mg q 5 hrs.    Instructed patient to use imodium 4 mg po q 4 hrs PRN diarrhea Adding lomotil up to qid PRN Xeloda has not been shipped to patient.   Will dose reduce Xeloda from 2000 mg bid to  1500 mg bid.    May 13 2023:  Cycle 4 XelOX.  Received total of 1 liter IVF during that visit due to hypotension, potassium due to hypokalemia.  Patient was told to increase dose of supplemental potassium  May 21 2023:  Seen as a work in due to side effects from chemotherapy.  Has lost 11 lbs since last visit.  Has been experiencing liquid stools sometimes almost hourly.  No nausea, emesis, mouth sores. Abdominal cramping. No fevers.  Appetite decreased.   Taking imodium 2 mg bid.  Not certain if she is taking lomotil.   Received IVF, IV potassium in infusion center WBC 3.8 hemoglobin 10.4 platelet count 50 CMP notable for potassium 3.4 AST 38 albumin 3.8 magnesium 1.7 DPD 5-fluorouracil toxicity panel sent  May 22 2023:  Fell backwards on steps outside.  Struck right elbow and knee but not head.  Skin tear to right elbow.   May 23, 2023: Scheduled follow-up to monitor side effects from chemotherapy.  Anorectic but eating  some.  Has gained 1 lbs.  Today experienced flash HA and accompanied by chest pain which last 2 to 4 minutes.  Yesterday had three diarrheal stools.  Significant improvement since taking Ondansetron, Lomotil and Imodium on scheduled basis.  Spending most of time sleeping.  Lost balance twice today.  Will stop Xeloda and arrange for hospitalization.    May 30 2023:  Post hospital follow up.  Has gained 5 lbs due to edema acquired during hospitalization.  Did not bring walker today but had one yesterday. Still having watery four to five times daily.  Experiencing LLQ abdominal pain.  Anorectic.  Using imodium twice daily.  Husband not certain how many times she is using lomotil daily.  May be using ondansetron bid.  Memory not good but may be improving slightly.  Will start Levaquin/Flagyl for possible diverticulitis.    June 05 2023:  Meningococcal vaccine injection June 06 2023:  Ultimiris   June 10 2023:    Diarrhea has resolved.  Appetite has improved.  Memory still an issue.  Accidentally took mother's Keppra and Gabapentin over the weekend which were out on the table which resulted in confusion and worsening of balance.  Yesterday while coming into the house fell backwards; no LOC, no head or neck pain.  Husband called poison control which recommended conservative management.    Reviewed mediations and struck off medications not being used.  Will start dyazide daily for edema  June 12 2023: WBC 3.3 hemoglobin 10.1 MCV 105 platelet count 70.  Reticulocyte count 3.9% haptoglobin 27 LDH 182 Alb 3.4 Cr 0.97  June 17, 2023:  Scheduled follow up for MAHA and colon cancer.   Has lost 9 pounds since last visit due to diuresis.  Eating one meal per day.  Appetite a bit improved.  Memory appears to be improving.  Still having gait issues; needs to ambulate with a walker to prevent falls.  Had a fall outside her home while picking figs and did not have a walker.   Reviewed results of labs with  patient and husband.  Taking PCN twice daily   WBC 3.4 hemoglobin 10.1 MCV 104 platelet count 74.  Reticulocyte count 2.6 haptoglobin 60  LDH 159 Creatinine 1.26 albumin 3.6  June 20 2023:  Ultimiris  Review of Systems  Constitutional:  Positive for appetite change and unexpected weight change. Negative for chills, fatigue and fever.  HENT:   Negative for mouth sores, nosebleeds, sore throat, tinnitus  and trouble swallowing.   Eyes:  Negative for eye problems and icterus.       Vision changes:  None  Respiratory:  Negative for chest tightness, hemoptysis, shortness of breath and wheezing.        One month of nonproductive cough.  No history of seasonable allergies  Cardiovascular:  Negative for chest pain and leg swelling.  Gastrointestinal:  Positive for diarrhea and nausea. Negative for constipation, rectal pain and vomiting.  Endocrine: Negative for hot flashes.       Cold intolerance:  none Heat intolerance:  none  Genitourinary:  Negative for bladder incontinence, difficulty urinating, dysuria, frequency, hematuria and nocturia.   Musculoskeletal:  Positive for gait problem. Negative for arthralgias, back pain, myalgias, neck pain and neck stiffness.  Skin:  Negative for itching, rash and wound.  Neurological:  Positive for dizziness, extremity weakness, gait problem and headaches. Negative for numbness, seizures and speech difficulty.  Hematological:  Negative for adenopathy. Bruises/bleeds easily.  Psychiatric/Behavioral:  Positive for confusion. Negative for sleep disturbance and suicidal ideas. The patient is not nervous/anxious.     MEDICAL HISTORY: Past Medical History:  Diagnosis Date   Abnormal glucose    Achilles tendinitis of left lower extremity    Acute suppurative arthritis due to bacteria (HCC) 03/09/2011   Overview:  Last Assessment & Plan:  Methicillin sensitive staphylococcal aureus septic hip. She clearly does need I&D of the hip. I will have her continue  the doxycycline for now but stop it 7 days prior to her surgery to maximize the yield on the cultures in the operating room though I be shocked if we don't find methicillin sensitive staph aureus again.. I agree with removing as much of the pros   Anal fissure 11/30/2015   Arthritis    right hip   Atypical chest pain 04/09/2016   Bilateral edema of lower extremity    BMI 39.0-39.9,adult    Cancer [facility])    SKIN CANCER   Carpal tunnel syndrome 03/09/2011   Overview:  Last Assessment & Plan:  Clinically this seems to be carpal tunnel syndrome. Giving given her history of infection though we can keep in the back of our mind the idea that she will have a cervical spine problem but I think this is unlikely at this point in time medics carpal tunnel syndrome fits clinically this was a positive Tinel's sign however in for a brace for her.   Chronic vaginitis    Chronic venous insufficiency    Depression    Dyslipidemia    Fatigue 10/13/2015   Fibromyalgia    GERD without esophagitis    History of immune thrombocytopenia 03/09/2011   History of operative procedure on hip 03/10/2012   History of TTP (thrombotic thrombocytopenic purpura)    Hypokalemia    Lichen sclerosus et atrophicus of the vulva 11/30/2015   Major depressive disorder, recurrent, moderate (HCC)    Methicillin susceptible Staphylococcus aureus infection 03/09/2011   Overview:  Last Assessment & Plan:  This is undoubtedlythe culprit organism   Migraine    Morbid obesity with BMI of 45.0-49.9, adult (HCC) 10/13/2015   Morbidly obese (HCC)    MSSA (methicillin susceptible Staphylococcus aureus) infection    Near syncope    Osteoarthritis, chronic    Pain due to total hip replacement (HCC) 09/20/2011   Palpitations 03/04/2019   Postmenopausal    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 syndrome (HCC)    20 yrs ago-not seen anyone for 10  yrs.   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 Mild diverticulosis. Small internal hemorrhoids. Healed anal fissure   ENDOVENOUS ABLATION SAPHENOUS VEIN W/ LASER Right 04/02/2019   endovenous laser ablation right greater saphenous vein by Waverly Ferrari MD    JOINT REPLACEMENT  08/2009   right hip replacement   REVISION TOTAL HIP ARTHROPLASTY     TONSILLECTOMY  1963   TOTAL HIP REVISION  10/08/2011   Procedure: TOTAL HIP REVISION;  Surgeon: [name];  Location: [facility];  Service: Orthopedics;  Laterality: Right;  Resection of Right Total Hip/Extended Trochanteric Osteotomy/Placement of Antibiotic Total Hip Cemented by Depuy   TOTAL HIP REVISION  03/10/2012   Procedure: TOTAL HIP REVISION;  Surgeon: [name], MD;  Location: [facility];  Service: Orthopedics;  Laterality: Right;  Reimplantation/Revision of a Right Total Hip and Removal of Cemented Implant    SOCIAL HISTORY: Social History   Socioeconomic History   Marital status: Married    Spouse name: Raiford Noble   Number of children: Not on file   Years of education: 12+7   Highest education level: Bachelor's degree (e.g., BA, AB, BS)  Occupational History   Occupation: Magazine features editor: [employer]  Tobacco Use   Smoking status: Former    Current packs/day: 0.00    Average packs/day: 1.5 packs/day for 20.0 years (30.0 ttl pk-yrs)    Types: Cigarettes    Start date: 10/04/1970    Quit date: 10/04/1990    Years since quitting: 32.7   Smokeless tobacco: Former    Quit date: 03/09/1991  Vaping Use   Vaping status: Never Used  Substance and Sexual Activity   Alcohol use: Yes    Comment: OCCASIONAL   Drug use: No   Sexual activity: Yes    Partners: Male  Other Topics Concern   Not on file  Social History Narrative   Not on file   Social Determinants of Health    Financial Resource Strain: Not on file  Food Insecurity: Low Risk  (02/04/2023)   [facility]   Food vital sign    Within the past 12 months, you worried that your food would run out before you got money to buy more: Never true    Within the past 12 months, the food you bought just didn't last and you didn't have money to get more. : Never true  Transportation Needs: Not on file (02/04/2023)  Physical Activity: Not on file  Stress: Not on file  Social Connections: Not on file  Intimate Partner Violence: Not on file    FAMILY HISTORY Family History  Problem Relation Age of Onset   Hypertension Mother    Hypertension Father    Alzheimer's disease Father    Prostate cancer Father    Hypertension Maternal Grandfather    Seizures Other    Colon cancer Neg Hx    Stomach cancer Neg Hx    Rectal cancer Neg Hx    Esophageal cancer Neg Hx     ALLERGIES:  has No Known Allergies.  MEDICATIONS:  Current Outpatient Medications  Medication Sig Dispense Refill   aluminum-magnesium hydroxide 200-200 MG/5ML suspension Take 15 mLs by mouth every 6 (six) hours as needed for indigestion.  AMBULATORY NON FORMULARY MEDICATION Medication Name: Magic Mouth Wash 3 parts Mylanta/Maalox 2 parts Benadryl 1 part Viscous Lidocaine  Swish and Swallow 5 mL every 3-4 hours as needed (Patient not taking: Reported on 05/30/2023) 240 mL 0   buPROPion (WELLBUTRIN XL) 150 MG 24 hr tablet Take 150 mg by mouth every evening.     clobetasol cream (TEMOVATE) 0.05 % Apply 1 Application topically as needed.     clotrimazole-betamethasone (LOTRISONE) cream Apply 1 Application topically 2 (two) times daily.     divalproex (DEPAKOTE) 250 MG DR tablet Take 250 mg by mouth 2 (two) times daily.     DULoxetine (CYMBALTA) 60 MG capsule Take 60 mg by mouth at bedtime.      esomeprazole (NEXIUM) 40 MG capsule Take 40 mg by mouth daily. Pt states has been on for years     Multiple Vitamin  (MULTIVITAMIN WITH MINERALS) TABS tablet Take 1 tablet by mouth daily.     nortriptyline (PAMELOR) 10 MG capsule Take 10 mg by mouth.     ondansetron (ZOFRAN) 8 MG tablet Take 1 tablet (8 mg total) by mouth 4 (four) times daily as needed for nausea or vomiting. 120 tablet 0   penicillin v potassium (VEETID) 500 MG tablet Take 1 tablet (500 mg total) by mouth 2 (two) times daily. 180 tablet 3   potassium chloride SA (KLOR-CON M) 20 MEQ tablet Take 3 tablet today, then start 1 tablet twice daily 64 tablet 0   SUMAtriptan (IMITREX) 25 MG tablet Take 25 mg by mouth.     triamterene-hydrochlorothiazide (DYAZIDE) 37.5-25 MG capsule Take 1 each (1 capsule total) by mouth daily. 30 capsule 1   No current facility-administered medications for this visit.    PHYSICAL EXAMINATION:  ECOG PERFORMANCE STATUS: 1 - Symptomatic but completely ambulatory   There were no vitals filed for this visit.     There were no vitals filed for this visit.      Physical Exam Vitals and nursing note reviewed.  Constitutional:      General: She is not in acute distress.    Appearance: She is obese. She is not ill-appearing, toxic-appearing or diaphoretic.     Comments: Here with husband.  Ill appearing  HENT:     Head: Normocephalic and atraumatic.     Right Ear: External ear normal.     Left Ear: External ear normal.     Nose: Nose normal. No congestion or rhinorrhea.     Mouth/Throat:     Mouth: Mucous membranes are dry.     Pharynx: No oropharyngeal exudate or posterior oropharyngeal erythema.  Eyes:     General: No scleral icterus.    Extraocular Movements: Extraocular movements intact.     Conjunctiva/sclera: Conjunctivae normal.     Pupils: Pupils are equal, round, and reactive to light.  Cardiovascular:     Rate and Rhythm: Normal rate.     Heart sounds: No murmur heard.    No friction rub. No gallop.  Abdominal:     General: Bowel sounds are normal.     Palpations: Abdomen is soft.      Tenderness: There is no abdominal tenderness. There is no guarding or rebound.  Musculoskeletal:        General: No swelling, tenderness or deformity.     Cervical back: Normal range of motion and neck supple. No rigidity or tenderness.     Comments: Bilateral LE edema much improved.    Lymphadenopathy:  Head:     Right side of head: No submental, submandibular, tonsillar, preauricular, posterior auricular or occipital adenopathy.     Left side of head: No submental, submandibular, tonsillar, preauricular, posterior auricular or occipital adenopathy.     Cervical: No cervical adenopathy.     Right cervical: No superficial, deep or posterior cervical adenopathy.    Left cervical: No superficial, deep or posterior cervical adenopathy.     Upper Body:     Right upper body: No supraclavicular, axillary, pectoral or epitrochlear adenopathy.     Left upper body: No supraclavicular, axillary, pectoral or epitrochlear adenopathy.  Skin:    General: Skin is warm and dry.     Coloration: Skin is not jaundiced.     Comments: Ecchymoses pretibial regions bilateral  Neurological:     General: No focal deficit present.     Mental Status: She is alert and oriented to person, place, and time.     Cranial Nerves: No cranial nerve deficit.     Comments: Seated in chair.  Much more interactive and conversant.      Psychiatric:     Comments: Forgetful.  Talkative.  Insight not good      LABORATORY DATA: I have personally reviewed the data as listed:  Appointment on 06/12/2023  Component Date Value Ref Range Status   LDH 06/12/2023 182  98 - 192 U/L Final   Performed at [facility], [address] [place], [place] [address]   Retic Ct Pct 06/12/2023 3.9 (H)  0.4 - 3.1 % Final   RBC. 06/12/2023 3.03 (L)  3.87 - 5.11 MIL/uL Final   Retic Count, Absolute 06/12/2023 117.6  19.0 - 186.0 K/uL Final   Immature Retic Fract 06/12/2023 10.0  2.3 - 15.9 % Final   Performed at [facility], [address], [place], [place] [address]   Haptoglobin 06/12/2023 27 (L)  37 - 355 mg/dL Final   Comment: (NOTE) Performed At: [facility] [address] [place], [place] [id number] Jolene Schimke MD WG:[id number]    Sodium 06/12/2023 136  135 - 145 mmol/L Final   Potassium 06/12/2023 4.1  3.5 - 5.1 mmol/L Final   Chloride 06/12/2023 101  98 - 111 mmol/L Final   CO2 06/12/2023 27  22 - 32 mmol/L Final   Glucose, Bld 06/12/2023 95  70 - 99 mg/dL Final   Glucose reference range applies only to samples taken after fasting for at least 8 hours.   BUN 06/12/2023 13  8 - 23 mg/dL Final   Creatinine 78/46/9629 0.97  0.44 - 1.00 mg/dL Final   Calcium 52/84/1324 8.8 (L)  8.9 - 10.3 mg/dL Final   Total Protein 40/07/2724 6.2 (L)  6.5 - 8.1 g/dL Final   Albumin 36/64/4034 3.4 (L)  3.5 - 5.0 g/dL Final   AST 74/25/9563 25  15 - 41 U/L Final   ALT 06/12/2023 12  0 - 44 U/L Final   Alkaline Phosphatase 06/12/2023 54  38 - 126 U/L Final   Total Bilirubin 06/12/2023 0.6  0.3 - 1.2 mg/dL Final   GFR, Estimated 06/12/2023 >60  >60 mL/min Final   Comment: (NOTE) Calculated using the CKD-EPI Creatinine Equation (2021)    Anion gap 06/12/2023 8  5 - 15 Final   Performed at [facility], [address] [place], [place] [address]   WBC Count 06/12/2023 3.3 (L)  4.0 - 10.5 K/uL Final   RBC 06/12/2023 3.02 (L)  3.87 - 5.11 MIL/uL Final   Hemoglobin 06/12/2023 10.1 (L)  12.0 - 15.0 g/dL Final   HCT 16/07/9603 31.6 (L)  36.0 - 46.0 % Final   MCV 06/12/2023 104.6 (H)  80.0 - 100.0 fL Final   MCH 06/12/2023 33.4  26.0 - 34.0 pg Final   MCHC 06/12/2023 32.0  30.0 - 36.0 g/dL Final   RDW 54/06/8118 18.9 (H)  11.5 - 15.5 % Final   Platelet Count 06/12/2023 70 (L)  150 - 400 K/uL Final   Comment: SPECIMEN CHECKED FOR CLOTS Immature Platelet Fraction may be clinically indicated, consider ordering this additional test [id number] REPEATED TO  VERIFY PLATELET COUNT CONFIRMED BY SMEAR    nRBC 06/12/2023 0.0  0.0 - 0.2 % Final   Neutrophils Relative % 06/12/2023 54  % Final   Neutro Abs 06/12/2023 1.8  1.7 - 7.7 K/uL Final   Lymphocytes Relative 06/12/2023 24  % Final   Lymphs Abs 06/12/2023 0.8  0.7 - 4.0 K/uL Final   Monocytes Relative 06/12/2023 19  % Final   Monocytes Absolute 06/12/2023 0.6  0.1 - 1.0 K/uL Final   Eosinophils Relative 06/12/2023 1  % Final   Eosinophils Absolute 06/12/2023 0.0  0.0 - 0.5 K/uL Final   Basophils Relative 06/12/2023 1  % Final   Basophils Absolute 06/12/2023 0.0  0.0 - 0.1 K/uL Final   Immature Granulocytes 06/12/2023 1  % Final   Abs Immature Granulocytes 06/12/2023 0.02  0.00 - 0.07 K/uL Final   Performed at [facility], [address] [place], [place] [address]  Clinical Support on 06/03/2023  Component Date Value Ref Range Status   WBC MORPHOLOGY 06/03/2023 MORPHOLOGY UNREMARKABLE   Final   RBC MORPHOLOGY 06/03/2023 ELLIPTOCYTES   Final   Comment: POLYCHROMASIA PRESENT TEARDROP CELLS    Clinical Information 06/03/2023 thrombocytopenia   Final   Performed at [facility], [address], [place], [place] [address]   C3 Complement 06/03/2023 96  82 - 167 mg/dL Final   Complement C4, Body Fluid 06/03/2023 32  12 - 38 mg/dL Final   Comment: (NOTE) Performed At: [facility] [address] [place], [place] [id number] Jolene Schimke MD XL:[id number]    PTT Lupus Anticoagulant 06/03/2023 35.7  0.0 - 43.5 sec Final   DRVVT 06/03/2023 38.5  0.0 - 47.0 sec Final   Lupus Anticoag Interp 06/03/2023 Comment:   Corrected   Comment: (NOTE) No lupus anticoagulant was detected. Performed At: [facility] [address] [place], [place] [id number] Jolene Schimke MD QQ:[id number]    Hepatitis B Surface Ag 06/03/2023 NON REACTIVE  NON REACTIVE Final   HCV Ab 06/03/2023 Reactive (A)  NON REACTIVE Final   Hep A IgM 06/03/2023 NON REACTIVE   NON REACTIVE Final   Hep B C IgM 06/03/2023 NON REACTIVE  NON REACTIVE Final   Performed at [facility], [address] [place], [place] [address]   Heparin Induced Plt Ab 06/03/2023 0.037  0.000 - 0.400 OD Final   Comment: (NOTE) Performed At: [facility] [address] [place], [place] [id number] Jolene Schimke MD TK:[id number]    Beta-2 Glyco I IgG 06/03/2023 <9  0 - 20 GPI IgG units Final   Comment: (NOTE) The reference interval reflects a 3SD or 99th percentile interval, which is thought to represent a potentially clinically significant result in accordance with the International Consensus Statement on the classification criteria for definitive antiphospholipid syndrome (APS). J Thromb Haem 2006;4:295-306.  Beta-2-Glycoprotein I IgM 06/03/2023 <9  0 - 32 GPI IgM units Final   Comment: (NOTE) The reference interval reflects a 3SD or 99th percentile interval, which is thought to represent a potentially clinically significant result in accordance with the International Consensus Statement on the classification criteria for definitive antiphospholipid syndrome (APS). J Thromb Haem 2006;4:295-306. Performed At: [facility] [address] [place], [place] [id number] Jolene Schimke MD [id number]    Beta-2-Glycoprotein I IgA 06/03/2023 <9  0 - 25 GPI IgA units Final   Comment: (NOTE) The reference interval reflects a 3SD or 99th percentile interval, which is thought to represent a potentially clinically significant result in accordance with the International Consensus Statement on the classification criteria for definitive antiphospholipid syndrome (APS). J Thromb Haem 2006;4:295-306.    Anticardiolipin IgG 06/03/2023 <9  0 - 14 GPL U/mL Final   Comment: (NOTE)                          Negative:              <15                          Indeterminate:     15 - 20                          Low-Med Positive: >20 - 80                          High  Positive:         >80    Anticardiolipin IgM 06/03/2023 9  0 - 12 MPL U/mL Final   Comment: (NOTE)                          Negative:              <13                          Indeterminate:     13 - 20                          Low-Med Positive: >20 - 80                          High Positive:         >80 Performed At: [facility] [address] [place], [place] [id number] Jolene Schimke MD [id number]    ds DNA Ab 06/03/2023 1  0 - 9 IU/mL Final   Comment: (NOTE)                                   Negative      <5                                   Equivocal  5 - 9                                   Positive      >  9    Ribonucleic Protein 06/03/2023 <0.2  0.0 - 0.9 AI Final   ENA SM Ab Ser-aCnc 06/03/2023 <0.2  0.0 - 0.9 AI Final   Scleroderma (Scl-70) (ENA) Antibod* 06/03/2023 <0.2  0.0 - 0.9 AI Final   SSA (Ro) (ENA) Antibody, IgG 06/03/2023 <0.2  0.0 - 0.9 AI Final   SSB (La) (ENA) Antibody, IgG 06/03/2023 <0.2  0.0 - 0.9 AI Final   Chromatin Ab SerPl-aCnc 06/03/2023 0.2  0.0 - 0.9 AI Final   Anti JO-1 06/03/2023 <0.2  0.0 - 0.9 AI Final   Centromere Ab Screen 06/03/2023 <0.2  0.0 - 0.9 AI Final   See below: 06/03/2023 Comment   Final   Comment: (NOTE) Autoantibody                       Disease Association ------------------------------------------------------------                        Condition                  Frequency ---------------------   ------------------------   --------- Antinuclear Antibody,    SLE, mixed connective Direct (ANA-D)           tissue diseases ---------------------   ------------------------   --------- dsDNA                    SLE                        40 - 60% ---------------------   ------------------------   --------- Chromatin                Drug induced SLE                90%                         SLE                        48 - 97% ---------------------   ------------------------   --------- SSA (Ro)                 SLE                         25 - 35%                         Sjogren's Syndrome         40 - 70%                         Neonatal Lupus                 100% ---------------------   ------------------------   --------- SSB (La)                 SLE                                                       10%  Sjogren's Syndrome              30% ---------------------   -----------------------    --------- Sm (anti-Smith)          SLE                        15 - 30% ---------------------   -----------------------    --------- RNP                      Mixed Connective Tissue                         Disease                         95% (U1 nRNP,                SLE                        30 - 50% anti-ribonucleoprotein)  Polymyositis and/or                         Dermatomyositis                 20% ---------------------   ------------------------   --------- Scl-70 (antiDNA          Scleroderma (diffuse)      20 - 35% topoisomerase)           Crest                           13% ---------------------   ------------------------   --------- Jo-1                     Polymyositis and/or                         Dermatomyositis            20 - 40% ---------------------   ------------------------   --------- Centromere B             Scleroderma -                           Crest                         variant                         80% Performed At: [facility] [address] [place], [place] [id number] Jolene Schimke MD WU:[id number]    Retic Ct Pct 06/03/2023 5.8 (H)  0.4 - 3.1 % Final   RBC. 06/03/2023 2.98 (L)  3.87 - 5.11 MIL/uL Final   Retic Count, Absolute 06/03/2023 173.1  19.0 - 186.0 K/uL Final   Immature Retic Fract 06/03/2023 21.2 (H)  2.3 - 15.9 % Final   Performed at [facility], [address], [place], [place] [address]   Haptoglobin 06/03/2023 <10 (L)  37 - 355 mg/dL Final   Comment: (NOTE) Performed At: [facility] [address]  [place], [place] [id number] Jolene Schimke MD NG:[id number]    HCV Ab 06/03/2023 Reactive (A)  Non Reactive Final  Comment: (NOTE) Performed At: [facility] [address] [place], [place] [id number] Jolene Schimke MD WU:[id number]    Hepatitis C Quantitation 06/03/2023 HCV Not Detected  IU/mL Final   Test Information (HCV): 06/03/2023 Comment   Final   Comment: (NOTE) The quantitative range of this assay is 15 IU/mL to 100 million IU/mL.    Interpretation (HCV): 06/03/2023 Comment   Final   Comment: (NOTE) Positive HCV antibody screen without the presence of HCV RNA is consistent with a resolved past infection or a false positive HCV antibody. Consider repeat testing after one month. Performed At: [facility] [address] [place] [address] [place], [place] [id number] Jolene Schimke MD VH:[id number]   Office Visit on 06/03/2023  Component Date Value Ref Range Status   WBC 06/03/2023 3.3 (L)  4.0 - 10.5 K/uL Final   RBC 06/03/2023 2.99 (L)  3.87 - 5.11 MIL/uL Final   Hemoglobin 06/03/2023 9.7 (L)  12.0 - 15.0 g/dL Final   HCT 41/32/4401 30.4 (L)  36.0 - 46.0 % Final   MCV 06/03/2023 101.7 (H)  80.0 - 100.0 fL Final   MCH 06/03/2023 32.4  26.0 - 34.0 pg Final   MCHC 06/03/2023 31.9  30.0 - 36.0 g/dL Final   RDW 02/72/5366 21.6 (H)  11.5 - 15.5 % Final   Platelets 06/03/2023 75 (L)  150 - 400 K/uL Final   Comment: SPECIMEN CHECKED FOR CLOTS Immature Platelet Fraction may be clinically indicated, consider ordering this additional test [id number] REPEATED TO VERIFY PLATELET COUNT CONFIRMED BY SMEAR    nRBC 06/03/2023 0.0  0.0 - 0.2 % Final   Neutrophils Relative % 06/03/2023 47  % Final   Neutro Abs 06/03/2023 1.5 (L)  1.7 - 7.7 K/uL Final   Lymphocytes Relative 06/03/2023 27  % Final   Lymphs Abs 06/03/2023 0.9  0.7 - 4.0 K/uL Final   Monocytes Relative 06/03/2023 24  % Final   Monocytes Absolute 06/03/2023 0.8  0.1 - 1.0 K/uL Final   Eosinophils Relative 06/03/2023 1  %  Final   Eosinophils Absolute 06/03/2023 0.0  0.0 - 0.5 K/uL Final   Basophils Relative 06/03/2023 0  % Final   Basophils Absolute 06/03/2023 0.0  0.0 - 0.1 K/uL Final   Immature Granulocytes 06/03/2023 1  % Final   Abs Immature Granulocytes 06/03/2023 0.04  0.00 - 0.07 K/uL Final   Ovalocytes 06/03/2023 PRESENT   Final   Performed at [facility], [address] [place], [place] [address]  Appointment on 05/29/2023  Component Date Value Ref Range Status   LDH 05/29/2023 255 (H)  98 - 192 U/L Final   Performed at [facility], [address] [place], [place] [address]   WBC Count 05/29/2023 2.3 (L)  4.0 - 10.5 K/uL Final   RBC 05/29/2023 3.07 (L)  3.87 - 5.11 MIL/uL Final   Hemoglobin 05/29/2023 10.1 (L)  12.0 - 15.0 g/dL Final   HCT 33/29/5188 30.4 (L)  36.0 - 46.0 % Final   MCV 05/29/2023 99.0  80.0 - 100.0 fL Final   MCH 05/29/2023 32.9  26.0 - 34.0 pg Final   MCHC 05/29/2023 33.2  30.0 - 36.0 g/dL Final   RDW 41/66/0630 22.2 (H)  11.5 - 15.5 % Final   Platelet Count 05/29/2023 84 (L)  150 - 400 K/uL Final   Comment: SPECIMEN CHECKED FOR CLOTS Immature Platelet Fraction may be clinically indicated, consider ordering this additional test [id number] REPEATED TO VERIFY PLATELET COUNT CONFIRMED BY SMEAR  nRBC 05/29/2023 0.0  0.0 - 0.2 % Final   Neutrophils Relative % 05/29/2023 66  % Final   Neutro Abs 05/29/2023 1.5 (L)  1.7 - 7.7 K/uL Final   Lymphocytes Relative 05/29/2023 18  % Final   Lymphs Abs 05/29/2023 0.4 (L)  0.7 - 4.0 K/uL Final   Monocytes Relative 05/29/2023 13  % Final   Monocytes Absolute 05/29/2023 0.3  0.1 - 1.0 K/uL Final   Eosinophils Relative 05/29/2023 0  % Final   Eosinophils Absolute 05/29/2023 0.0  0.0 - 0.5 K/uL Final   Basophils Relative 05/29/2023 1  % Final   Basophils Absolute 05/29/2023 0.0  0.0 - 0.1 K/uL Final   WBC Morphology 05/29/2023 TOXIC GRANULATION   Final   Smear Review 05/29/2023 MORPHOLOGY  UNREMARKABLE   Final   Immature Granulocytes 05/29/2023 2  % Final   Abs Immature Granulocytes 05/29/2023 0.04  0.00 - 0.07 K/uL Final   Ovalocytes 05/29/2023 PRESENT   Final   Performed at [facility], [address], [place], [place] [address]   Haptoglobin 05/29/2023 <10 (L)  37 - 355 mg/dL Final   Comment: (NOTE) Performed At: [facility] [address] [place], [place] [id number] Jolene Schimke MD ZD:[id number]    DAT, complement 05/29/2023 NEG   Final   DAT, IgG 05/29/2023    Final                   Value:NEG Performed at [facility], [address], [place], [place] [address]   Abstract on 05/21/2023  Component Date Value Ref Range Status   Glucose 05/21/2023 96   Final   BUN 05/21/2023 15  4 - 21 Final   CO2 05/21/2023 27 (A)  13 - 22 Final   Creatinine 05/21/2023 0.9  0.5 - 1.1 Final   Potassium 05/21/2023 3.4 (A)  3.5 - 5.1 mEq/L Final   Sodium 05/21/2023 135 (A)  137 - 147 Final   Chloride 05/21/2023 100  99 - 108 Final   Calcium 05/21/2023 9.5  8.7 - 10.7 Final   Albumin 05/21/2023 3.8  3.5 - 5.0 Final   Alkaline Phosphatase 05/21/2023 76  25 - 125 Final   ALT 05/21/2023 17  7 - 35 U/L Final   AST 05/21/2023 38 (A)  13 - 35 Final   Bilirubin, Total 05/21/2023 1.1   Final   Magnesium 05/21/2023 1.70   Final  Abstract on 05/21/2023  Component Date Value Ref Range Status   Hemoglobin 05/21/2023 10.4 (A)  12.0 - 16.0 Final   HCT 05/21/2023 30 (A)  36 - 46 Final   Neutrophils Absolute 05/21/2023 1.75   Final   Platelets 05/21/2023 50 (A)  150 - 400 K/uL Final   WBC 05/21/2023 3.8   Final   RBC 05/21/2023 3.28 (A)  3.87 - 5.11 Final  Appointment on 05/21/2023  Component Date Value Ref Range Status   DPD 5-Fluorouracil Result: 05/21/2023 See Scanned report in [facility]   Final   DPD 5-Fluorouracil Director Review: 05/21/2023 See Scanned report in [place] Link   Final   Performed at [facility], [address], [place], [place] [address]   CBC with Differential 05/21/2023 Sent to [facility] for testing. See scanned report.   Final   Performed at [facility], [address] [place], [place] [address]   Comprehensive Metabolic Panel 05/21/2023 Sent to [facility] for testing. See scanned report.  Final   Performed at [facility], [address], [place], [place] [address]    RADIOGRAPHIC STUDIES: I have personally reviewed the radiological images as listed and agree with the findings in the report  No results found.  ASSESSMENT/PLAN  67 y.o. female is here because of  colon cancer.  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 Mass was partially circumferential (involves < 1/3rd of the lumen circumference). 10 mm polyp in proximal ascending colon which was sessile.  Pathology ulcerated mass- invasive moderately differentiated adenocarcinoma arising within a tubular adenoma with high-grade dysplasia   January 15 2023- CEA 2.9 Chromogranin A 564  January 17 2023- CT CAP  Short segment asymmetric wall thickening of the transverse colon measuring 3.3 cm in length.   Small adjacent soft tissue nodule/lymph nodes adjacent to the colonic wall thickening measuring 6 mm, suspicious  for local nodal disease. Prominent/mildly enlarged right upper quadrant lymph nodes similar to January 26, 2019 and favored reactive.  No distant metastatic disease.  Hepatomegaly with nodular hepatic contour, suggestive of cirrhosis Cholelithiasis without findings of acute cholecystitis.    Incidental right thyroid nodule   January 23 2023- Chromogranin A 294  February 04 2023:  Transverse colectomy.  Pathology showed adenocarcinoma invading visceral peritoneum, Grade 3.  All of 30 lymph nodes negative for tumor.  MSI intact Feb 25 2023- On basis of spontaneous improvement in chromogranin A will hold on Dodatate PET.     Therapeutics-  Feb 25 2023- Reviewed NCCN treatment guidelines with patient and husband.  They are agreeable to proceed with adjuvant CAPOX x 6 months.   Mar 11 2023- Cycle 1 XelOx Mar 20 2023- Tolerated cycle 1 well with only some mild cold neuropathy April 01 2023- Cycle 2 XelOx   April 22 2023: Cycle 3 XelOx.              April 30 2023- Experiencing mucositis, anorexia, nausea and diarrhea.    May 13 2023: Cycle 4 XelOX.  Dose reduced Xeloda from 2000 mg bid to 1500 mg bid due to GI symptoms.  This will be her last cycle of adjuvant therapy  May 23 2023- Stopping Xeloda due to side effects and referring for admission.  Contacted ED attending   Chemotherapy induced nausea and diarrhea             April 30 2023-  Instructed patient to do the following-- Take Ondansetron 8 mg, four times daily.  Take Imodium 4 mg every 4 hrs for diarrhea Use the magic mouthwash every 4 hours while awake.  Arranged for patient to receive IVF and antiemetics.  To follow up in 2 days to assess progress     May 02 2023- To complete this course of Xeloda on July 15th.  Should improve with the week off between then and beginning of Cycle 4.  Anticipate dose reduction in Xeloda with Cycle 4.  Sx under better control today than at last visit due to better use of supportive care May 08 2023-  Continues to have 5 to 6 bowel movements daily.  Using imodium 2 mg q 5 hrs.  Instructed patient to use  imodium 4 mg po q 4 hrs PRN diarrhea Adding lomotil up to qid PRN.  Has required potassium replacement due to diarrheal losses May 21 2023- Continues to have significant chemotherapy induced diarrhea.  Not  using supportive care medications to maximum potential. Provided written instructions to patient and husband again today.  Adding Levaquin 500 mg daily.  Sending stool for C diff.  Referring to infusion for IVF.  Chemistries sent to guide electrolyte replacement.  Follow up in 2 days.   Sent testing to evaluate for DPD deficiency.  Discussed plans with clinical pharmacist.   May 23 2023- Diarrhea improved with use of scheduled imodium, lomotil, ondansetron and since starting Levaquin daily.  Remains dehydrated as judged by exam (mucosa and skin dry, tachycardic)  May 30 2023- Diarrhea persists. Began empiric Levaquin/Flagyl for diverticulitis/gastroenteritis   Mental status changes May 23 2023- Likely multifactorial- 1) psychotropic medications (welbutrin, cymbalta, nortryptiline, depakote)  2) supportive care medications (Flexeril, lomotil, imodium, ondansetron)  3) chemotherapy (Xeloda) 4) Dehydration 5) underlying liver disease  Recommended admission via ED for evaluation and management.  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  Reviewed medication list to remove non-essential agents; particularly those with CNS side effects  June 17 2023- Mental status improving.    Microangiopathic hemolytic anemia             May 25 2023- Haptoglobin < 30.  GI pathogen panel negative negative for Shiga toxin and bacteria known to produce Shiga Toxin              May 30 2023- Laboratory evaluation notable for Hgb 10.1 PLT 84, LDH 255.  DAT negative.  Haptoglobin < 10.  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  Arranging for treatment with Ultomiris.  Arranged for immunization against meningococcus and begin PCN prophylaxis June 05 2023- Meningococcal vaccine June 06 2023- Ultomiris load June 10 2023- GI symptoms improving.  June 09 2023- Retic count improved to 2.6.  LDH and haptoglobin normal.  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  At that time TTP and HUS were distinguishable only on clinical grounds and were thought of as being almost the same disease.  The entity of atypical HUS was not known at the time.  It is therefore possible that patient had atypical HUS at that time which responded to plasma exchange      Diarrhea:  January 15 2023- Not explained by the colon cancer.  No evidence of inflammatory bowel disease by colonoscopy.    Chromogranin A 564  January 23 2023- Repeat Chromogranin A improved  Feb 25 2023- Diarrhea and chromogranin A improved therefore holding on Dotatate PET/CT    Poor venous access:    Mar 11 2023- Port placement   Headaches Mar 07 2023- Improved following surgery likely related to a component of general anesthesia.  To see neurology  Possible cirrhosis  January 17 2023- Nodular liver contour noted on CT CAP    Cancer Staging  Colon cancer [facility]) Staging form: Colon and Rectum, AJCC 8th Edition - Clinical stage from 02/25/2023: Stage IIB (cT4a, cN0, cM0) - Signed by Loni Muse, MD on 02/25/2023 Histopathologic type: Adenocarcinoma, NOS Stage  prefix: Initial diagnosis Total positive nodes: 0 Total nodes examined: 30 Histologic grade (G): G3 Histologic grading system: 4 grade system    No problem-specific Assessment & Plan notes found for this encounter.    No orders of the defined types were placed in this encounter.   40  minutes was spent in patient care.  This included time spent preparing to see the patient (e.g., review of tests), obtaining and/or reviewing separately obtained history, counseling and educating the patient/family/caregiver, ordering medications, tests, or procedures; documenting clinical information in the electronic or other health record, independently interpreting results and communicating results to the patient/family/caregiver as well as coordination of care.       All questions were answered. The patient knows to call the clinic with any problems, questions or concerns.  This note was electronically signed.    Loni Muse, MD  06/17/2023 8:24 AM

## 2023-06-17 NOTE — Patient Instructions (Signed)
Please begin B12 1000 mcg daily and Folic acid 1 mg daily

## 2023-06-18 ENCOUNTER — Other Ambulatory Visit: Payer: Self-pay

## 2023-06-18 LAB — HAPTOGLOBIN: Haptoglobin: 60 mg/dL (ref 37–355)

## 2023-06-19 ENCOUNTER — Encounter: Payer: Self-pay | Admitting: Oncology

## 2023-06-20 ENCOUNTER — Encounter: Payer: Self-pay | Admitting: Oncology

## 2023-06-20 ENCOUNTER — Inpatient Hospital Stay: Payer: Medicare PPO

## 2023-06-20 VITALS — BP 101/70 | HR 97 | Temp 98.4°F | Resp 18 | Ht 65.9 in | Wt 203.5 lb

## 2023-06-20 DIAGNOSIS — M311 Thrombotic microangiopathy, unspecified: Secondary | ICD-10-CM

## 2023-06-20 DIAGNOSIS — E876 Hypokalemia: Secondary | ICD-10-CM | POA: Diagnosis not present

## 2023-06-20 DIAGNOSIS — D594 Other nonautoimmune hemolytic anemias: Secondary | ICD-10-CM | POA: Diagnosis not present

## 2023-06-20 DIAGNOSIS — C184 Malignant neoplasm of transverse colon: Secondary | ICD-10-CM | POA: Diagnosis not present

## 2023-06-20 DIAGNOSIS — D5939 Other hemolytic-uremic syndrome: Secondary | ICD-10-CM

## 2023-06-20 DIAGNOSIS — Z5111 Encounter for antineoplastic chemotherapy: Secondary | ICD-10-CM | POA: Diagnosis not present

## 2023-06-20 DIAGNOSIS — E860 Dehydration: Secondary | ICD-10-CM | POA: Diagnosis not present

## 2023-06-20 DIAGNOSIS — T50905S Adverse effect of unspecified drugs, medicaments and biological substances, sequela: Secondary | ICD-10-CM | POA: Insufficient documentation

## 2023-06-20 DIAGNOSIS — D649 Anemia, unspecified: Secondary | ICD-10-CM | POA: Diagnosis not present

## 2023-06-20 DIAGNOSIS — D6959 Other secondary thrombocytopenia: Secondary | ICD-10-CM | POA: Diagnosis not present

## 2023-06-20 DIAGNOSIS — Z23 Encounter for immunization: Secondary | ICD-10-CM | POA: Diagnosis not present

## 2023-06-20 DIAGNOSIS — Z79899 Other long term (current) drug therapy: Secondary | ICD-10-CM | POA: Diagnosis not present

## 2023-06-20 MED ORDER — SODIUM CHLORIDE 0.9% FLUSH
10.0000 mL | INTRAVENOUS | Status: DC | PRN
  Administered 2023-06-20: 10 mL

## 2023-06-20 MED ORDER — SODIUM CHLORIDE 0.9 % IV SOLN
3300.0000 mg | Freq: Once | INTRAVENOUS | Status: AC
  Administered 2023-06-20: 3300 mg via INTRAVENOUS
  Filled 2023-06-20: qty 33

## 2023-06-20 MED ORDER — SODIUM CHLORIDE 0.9 % IV SOLN: Freq: Once | INTRAVENOUS | Status: AC

## 2023-06-20 MED ORDER — HEPARIN SOD (PORK) LOCK FLUSH 100 UNIT/ML IV SOLN
500.0000 [IU] | Freq: Once | INTRAVENOUS | Status: AC | PRN
  Administered 2023-06-20: 500 [IU]

## 2023-06-20 NOTE — Patient Instructions (Signed)
 [facility]  Discharge Instructions: Thank you for choosing [facility] to provide your oncology and hematology care.  If you have a lab appointment with the Cancer Center, please go directly to the Cancer Center and check in at the registration area.   Wear comfortable clothing and clothing appropriate for easy access to any Portacath or PICC line.   We strive to give you quality time with your provider. You may need to reschedule your appointment if you arrive late (15 or more minutes).  Arriving late affects you and other patients whose appointments are after yours.  Also, if you miss three or more appointments without notifying the office, you may be dismissed from the clinic at the provider's discretion.      For prescription refill requests, have your pharmacy contact our office and allow 72 hours for refills to be completed.    Today you received the following chemotherapy and/or immunotherapy agents Ultomiris      To help prevent nausea and vomiting after your treatment, we encourage you to take your nausea medication as directed.  BELOW ARE SYMPTOMS THAT SHOULD BE REPORTED IMMEDIATELY: *FEVER GREATER THAN 100.4 F (38 C) OR HIGHER *CHILLS OR SWEATING *NAUSEA AND VOMITING THAT IS NOT CONTROLLED WITH YOUR NAUSEA MEDICATION *UNUSUAL SHORTNESS OF BREATH *UNUSUAL BRUISING OR BLEEDING *URINARY PROBLEMS (pain or burning when urinating, or frequent urination) *BOWEL PROBLEMS (unusual diarrhea, constipation, pain near the anus) TENDERNESS IN MOUTH AND THROAT WITH OR WITHOUT PRESENCE OF ULCERS (sore throat, sores in mouth, or a toothache) UNUSUAL RASH, SWELLING OR PAIN  UNUSUAL VAGINAL DISCHARGE OR ITCHING   Items with * indicate a potential emergency and should be followed up as soon as possible or go to the Emergency Department if any problems should occur.  Please show the CHEMOTHERAPY ALERT CARD or IMMUNOTHERAPY ALERT CARD at check-in to the  Emergency Department and triage nurse.  Should you have questions after your visit or need to cancel or reschedule your appointment, please contact [facility]  Dept: [phone]  and follow the prompts.  Office hours are 8:00 a.m. to 4:30 p.m. Monday - Friday. Please note that voicemails left after 4:00 p.m. may not be returned until the following business day.  We are closed weekends and major holidays. You have access to a nurse at all times for urgent questions. Please call the main number to the clinic Dept: [phone] and follow the prompts.  For any non-urgent questions, you may also contact your provider using MyChart. We now offer e-Visits for anyone 55 and older to request care online for non-urgent symptoms. For details visit [email or web address].   Also download the MyChart app! Go to the app store, search "MyChart", open the app, select [place], and log in with your MyChart username and password.

## 2023-06-20 NOTE — Progress Notes (Signed)
Patient declined to stay for 1 hour post Ultomiris observation. Patient states that she will call us if she has any difficulties.

## 2023-06-25 DIAGNOSIS — S8001XA Contusion of right knee, initial encounter: Secondary | ICD-10-CM | POA: Diagnosis not present

## 2023-06-25 DIAGNOSIS — I959 Hypotension, unspecified: Secondary | ICD-10-CM | POA: Diagnosis not present

## 2023-06-25 DIAGNOSIS — R531 Weakness: Secondary | ICD-10-CM | POA: Diagnosis not present

## 2023-06-25 DIAGNOSIS — R519 Headache, unspecified: Secondary | ICD-10-CM | POA: Diagnosis not present

## 2023-06-25 DIAGNOSIS — D594 Other nonautoimmune hemolytic anemias: Secondary | ICD-10-CM | POA: Diagnosis not present

## 2023-06-26 ENCOUNTER — Encounter: Payer: Self-pay | Admitting: Oncology

## 2023-06-26 ENCOUNTER — Telehealth: Payer: Self-pay

## 2023-06-26 LAB — COMPREHENSIVE METABOLIC PANEL: EGFR: 64

## 2023-06-26 NOTE — Telephone Encounter (Signed)
PCP's office called, LVM that they have discontinued pt's dyazide, due to hypotension in office, 88/68. Also, pt continues to have chronic lowe ext edema due to venous insufficiency. Pt is to continue wearing compression stockings.

## 2023-06-27 ENCOUNTER — Encounter: Payer: Self-pay | Admitting: Oncology

## 2023-07-01 ENCOUNTER — Encounter: Payer: Self-pay | Admitting: Oncology

## 2023-07-01 ENCOUNTER — Inpatient Hospital Stay: Payer: Medicare PPO | Attending: Oncology | Admitting: Oncology

## 2023-07-01 ENCOUNTER — Inpatient Hospital Stay: Payer: Medicare PPO

## 2023-07-01 ENCOUNTER — Telehealth: Payer: Self-pay

## 2023-07-01 VITALS — BP 122/63 | HR 88 | Temp 97.6°F | Resp 18 | Ht 65.9 in | Wt 204.2 lb

## 2023-07-01 DIAGNOSIS — Z79899 Other long term (current) drug therapy: Secondary | ICD-10-CM | POA: Insufficient documentation

## 2023-07-01 DIAGNOSIS — Z87891 Personal history of nicotine dependence: Secondary | ICD-10-CM | POA: Insufficient documentation

## 2023-07-01 DIAGNOSIS — C184 Malignant neoplasm of transverse colon: Secondary | ICD-10-CM | POA: Diagnosis not present

## 2023-07-01 DIAGNOSIS — R600 Localized edema: Secondary | ICD-10-CM

## 2023-07-01 DIAGNOSIS — Z09 Encounter for follow-up examination after completed treatment for conditions other than malignant neoplasm: Secondary | ICD-10-CM

## 2023-07-01 DIAGNOSIS — F05 Delirium due to known physiological condition: Secondary | ICD-10-CM

## 2023-07-01 DIAGNOSIS — T50905S Adverse effect of unspecified drugs, medicaments and biological substances, sequela: Secondary | ICD-10-CM | POA: Diagnosis not present

## 2023-07-01 DIAGNOSIS — D5939 Other hemolytic-uremic syndrome: Secondary | ICD-10-CM

## 2023-07-01 LAB — COMPREHENSIVE METABOLIC PANEL
ALT: 12 U/L (ref 0–44)
AST: 25 U/L (ref 15–41)
Albumin: 3.8 g/dL (ref 3.5–5.0)
Alkaline Phosphatase: 58 U/L (ref 38–126)
Anion gap: 11 (ref 5–15)
BUN: 8 mg/dL (ref 8–23)
CO2: 29 mmol/L (ref 22–32)
Calcium: 9.6 mg/dL (ref 8.9–10.3)
Chloride: 102 mmol/L (ref 98–111)
Creatinine, Ser: 0.9 mg/dL (ref 0.44–1.00)
GFR, Estimated: >60 mL/min (ref >60)
Glucose, Bld: 112 mg/dL — ABNORMAL HIGH (ref 70–99)
Potassium: 4.1 mmol/L (ref 3.5–5.1)
Sodium: 142 mmol/L (ref 135–145)
Total Bilirubin: 0.7 mg/dL (ref 0.3–1.2)
Total Protein: 6.8 g/dL (ref 6.5–8.1)

## 2023-07-01 LAB — CBC WITH DIFFERENTIAL/PLATELET
Abs Immature Granulocytes: 0.01 10*3/uL (ref 0.00–0.07)
Basophils Absolute: 0 10*3/uL (ref 0.0–0.1)
Basophils Relative: 1 %
Eosinophils Absolute: 0.1 10*3/uL (ref 0.0–0.5)
Eosinophils Relative: 2 %
HCT: 33.4 % — ABNORMAL LOW (ref 36.0–46.0)
Hemoglobin: 10.9 g/dL — ABNORMAL LOW (ref 12.0–15.0)
Immature Granulocytes: 0 %
Lymphocytes Relative: 33 %
Lymphs Abs: 1.3 10*3/uL (ref 0.7–4.0)
MCH: 33.6 pg (ref 26.0–34.0)
MCHC: 32.6 g/dL (ref 30.0–36.0)
MCV: 103.1 fL — ABNORMAL HIGH (ref 80.0–100.0)
Monocytes Absolute: 0.5 10*3/uL (ref 0.1–1.0)
Monocytes Relative: 13 %
Neutro Abs: 2 10*3/uL (ref 1.7–7.7)
Neutrophils Relative %: 51 %
Platelets: 77 10*3/uL — ABNORMAL LOW (ref 150–400)
RBC: 3.24 MIL/uL — ABNORMAL LOW (ref 3.87–5.11)
RDW: 13.3 % (ref 11.5–15.5)
WBC: 4 10*3/uL (ref 4.0–10.5)
nRBC: 0 % (ref 0.0–0.2)

## 2023-07-01 LAB — RETICULOCYTES
Immature Retic Fract: 3.8 % (ref 2.3–15.9)
RBC.: 3.24 MIL/uL — ABNORMAL LOW (ref 3.87–5.11)
Retic Count, Absolute: 44.4 10*3/uL (ref 19.0–186.0)
Retic Ct Pct: 1.4 % (ref 0.4–3.1)

## 2023-07-01 LAB — LACTATE DEHYDROGENASE: LDH: 140 U/L (ref 98–192)

## 2023-07-01 NOTE — Telephone Encounter (Signed)
-----   Message from [facility] sent at 06/28/2023  9:33 AM EDT ----- My direct line is the [phone].    If I don't answer I am most likely on the phone.     N[employer] and [employer]  offer support to people with aHUS.      NORD/TAF are non-profit patient assistance programs, separate from [place], and solely responsible for all assistance and payment determinations.        [employer] Rare [employer] (NORD)  #no online applications  [address]  [place], [place] [address]  [phone]  Fax: [phone]  Main Phone: [phone]  www.rarediseases.org     [employer] (TAF)  [address], [address]  [place], [place]  [address]  [phone]  www.TAFCares.org        Please let me know if I may be of other help.     Sheliah Mends

## 2023-07-01 NOTE — Progress Notes (Unsigned)
[facility] Cancer Initial Visit:  Patient Care Team: Hurshel Party, NP as PCP - General (Internal Medicine) Loni Muse, MD as Consulting Physician (Internal Medicine)  CHIEF COMPLAINTS/PURPOSE OF CONSULTATION:  Oncology History  Colon cancer [facility])  01/15/2023 Initial Diagnosis   Colon cancer (HCC)   02/25/2023 Cancer Staging   Staging form: Colon and Rectum, AJCC 8th Edition - Clinical stage from 02/25/2023: Stage IIB (cT4a, cN0, cM0) - Signed by Loni Muse, MD on 02/25/2023 Histopathologic type: Adenocarcinoma, NOS Stage prefix: Initial diagnosis Total positive nodes: 0 Total nodes examined: 30 Histologic grade (G): G3 Histologic grading system: 4 grade system   03/11/2023 - 05/13/2023 Chemotherapy   Patient is on Treatment Plan : COLORECTAL Xelox (Capeox)(130/850) q21d       HISTORY OF PRESENTING ILLNESS: [name] 67 y.o. female is here because of  colon cancer Medical history notable for septic arthritis of the hip, obesity, atypical chest pain, skin cancer, carpal tunnel syndrome, chronic vaginitis, chronic venous and sufficiency, TTP, syncope  January 10, 2023: Colonoscopy-ulcerated 2 cm nonobstructing small mass with heaped up margins found in distal transverse colon.  Mass was partially circumferential (involves less than one third of the lumen circumference) no bleeding present.  10 mm polyp in proximal ascending colon which was sessile.  Pathology-Ascending colon polyp was compatible with a sessile serrated adenoma without cytologic dysplasia Transverse colon polyp-invasive moderately differentiated adenocarcinoma arising within a tubular adenoma with high-grade dysplasia  January 15 2023: [facility] Oncology Consult Patient reports that the colonoscopy was performed for surveillance; however, she was also having episodes of intermittent diarrhea x 2 to 3 months.  Stools were not bloody, nor was she passing mucus but they were soft.  In  association with these episodes she would also feel dizzy and note an odd smell, nausea.   To see Dr. Logan Bores from surgery on January 22 2023   Social:  Married.  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  Tobacco quit 24 years.  Rare glass of wine  [facility] Mother alive 74 epileptic, dementia Father died 19 Alzheimers, prostate cancer Brother alive 36 arthritis requiring joint replacement,   WBC 6.4 hemoglobin 12.5 MCV 88 platelet count 178 normal differential CMP normal CEA 2.9 chromogranin A 564  January 17 2023:  CT CAP Short segment asymmetric wall thickening of the transverse colon measuring 3.3 cm in length, compatible with reported history of  primary colonic neoplasm.  Small adjacent soft tissue nodule/lymph nodes adjacent to the colonic wall thickening measuring 6 mm, nonspecific are suspicious  for local nodal disease. Prominent/mildly enlarged right upper quadrant lymph nodes are similar dating back to January 26, 2019 and favored reactive.  No convincing evidence of distant metastatic disease within the chest, abdomen or pelvis.  Hepatomegaly with nodular hepatic contour, suggestive of cirrhosis Cholelithiasis without findings of acute cholecystitis.   Incidental right thyroid nodule measuring   January 18 2023:  Presented to [facility] ED with headaches and chest discomfort.  CT PA negative for PE.  CT head without contrast negative.    January 22 2023:  Scheduled follow up for colon cancer.  Reviewed results of labs and imaging with patient and husband. Will repeat chromgranin A and refer for PET/CT if chromogranin A still significantly elevated.    January 23, 2023: Chromogranin A 294  February 04 2023:  Transverse colectomy Pathology showed adenocarcinoma invading visceral peritoneum, Grade 3.  All of 30 lymph nodes were negative for tumor.  Surgical Stage (T4a, N0 M0)  MSI intact  Feb 25, 2023:   Reviewed results of surgical pathology with patient and husband.  Per NCCN guidelines adjuvant  options are CAPEOX, FOLFOX 6 months, Capecitabine or observation.  Discussed risks and benefits of each  Mar 07 2023:   Bifrontal headaches have returned.  These began in October 2023 but resolved after her colon surgery.  Has seen neurology and underwent MRI brain negative for CNS lesion.  EEG normal.   (Raises the question of what medications in perioperative period helped with the HA's.)  Recovering from a dog bite on dorsum of right hand.  Will receive Capecitabine today.    Mar 11 2023:  Port placement.  Cycle 1 XelOx   Mar 19 2023:  Neurology follow up.    Mar 20 2023:   Agree with Neurology assessment that neurocognitive testing should not be performed it at all, until well after chemotherapy has been completed.  Experienced some mild nausea helped by antiemetics.   Has occasional HA's.  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   No HFS.     April 01 2023:  Cycle 2 XelOx  April 17 2023:  Has lost 4 lbs.  Appetite diminished due to dysgeusia.  No metallic taste.  Edges of tongue get red and sore and painful.  Mouth feels dry despite drinking a lot of water and tea.  Not using anything for her mouth sores.   Fatigue increased.  Has cold neuropathy and cold induced pharyngospasm.      Begin Magic mouthwash for mucositis.     April 22 2023:  Cycle 3 XelOx.    April 30 2023:    Patient called office yesterday stating that she was anorectic, nauseated, having mouth sores and diarrhea.  Has lost 13 lbs.  Has difficulty remembering details which complicates history taking.  States that anorexia began after starting this round of chemotherapy.  "Food does not get past my tongue"; did not recognize this was nausea so did not begin taking antiemetics until 3 days ago.  Only taking antiemetics bid.  Mouth feels dry but not painful.  No emesis.  Began with diarrhea in the form of 5 to 6 stools per day but does not believe that she is taking imodium for it.  Dysgeusia  even to water.   Not using magic mouthwash regularly  May 02 2023:    Diarrhea improved with imodium.  Using ondansetron alternating with compazine; no emesis with improvement in nausea.  Eating limited by mucositis which she can't say is helped by magic mouthwash.  Not eating cold foods.  Has gained 2 lbs in the last two days.  To complete the course of Xeloda on May 06 2023.    WBC 4.7 hemoglobin 11.6 platelet count 61; 60 segs 23 lymphs 13 monos 4 eos CMP notable for potassium 3.4 glucose 103  May 08 2023:   Scheduled follow-up regarding colon cancer. Has lost 4 lbs since last visit owing to anorexia.   No mouth sores but has dysgeusia and mouth feels dry.  Using magic mouthwash.  Nauseated but without emesis.  Continues to have 5 to 6 bowel movements daily.  Using imodium 2 mg q 5 hrs.    Instructed patient to use imodium 4 mg po q 4 hrs PRN diarrhea Adding lomotil up to qid PRN Xeloda has not been shipped to patient.   Will dose reduce Xeloda from 2000 mg bid to  1500 mg bid.    May 13 2023:  Cycle 4 XelOX.  Received total of 1 liter IVF during that visit due to hypotension, potassium due to hypokalemia.  Patient was told to increase dose of supplemental potassium  May 21 2023:  Seen as a work in due to side effects from chemotherapy.  Has lost 11 lbs since last visit.  Has been experiencing liquid stools sometimes almost hourly.  No nausea, emesis, mouth sores. Abdominal cramping. No fevers.  Appetite decreased.   Taking imodium 2 mg bid.  Not certain if she is taking lomotil.   Received IVF, IV potassium in infusion center WBC 3.8 hemoglobin 10.4 platelet count 50 CMP notable for potassium 3.4 AST 38 albumin 3.8 magnesium 1.7 DPD 5-fluorouracil toxicity panel sent  May 22 2023:  Fell backwards on steps outside.  Struck right elbow and knee but not head.  Skin tear to right elbow.   May 23, 2023: Scheduled follow-up to monitor side effects from chemotherapy.  Anorectic but eating  some.  Has gained 1 lbs.  Today experienced flash HA and accompanied by chest pain which last 2 to 4 minutes.  Yesterday had three diarrheal stools.  Significant improvement since taking Ondansetron, Lomotil and Imodium on scheduled basis.  Spending most of time sleeping.  Lost balance twice today.  Will stop Xeloda and arrange for hospitalization.    May 30 2023:  Post hospital follow up.  Has gained 5 lbs due to edema acquired during hospitalization.  Did not bring walker today but had one yesterday. Still having watery four to five times daily.  Experiencing LLQ abdominal pain.  Anorectic.  Using imodium twice daily.  Husband not certain how many times she is using lomotil daily.  May be using ondansetron bid.  Memory not good but may be improving slightly.  Will start Levaquin/Flagyl for possible diverticulitis.    June 05 2023:  Meningococcal vaccine injection June 06 2023:  Ultimiris   June 10 2023:    Diarrhea has resolved.  Appetite has improved.  Memory still an issue.  Accidentally took mother's Keppra and Gabapentin over the weekend which were out on the table which resulted in confusion and worsening of balance.  Yesterday while coming into the house fell backwards; no LOC, no head or neck pain.  Husband called poison control which recommended conservative management.    Reviewed mediations and struck off medications not being used.  Will start dyazide daily for edema  June 12 2023: WBC 3.3 hemoglobin 10.1 MCV 105 platelet count 70.  Reticulocyte count 3.9% haptoglobin 27 LDH 182 Alb 3.4 Cr 0.97  June 17, 2023:  Has lost 9 pounds since last visit due to diuresis.  Eating one meal per day.  Appetite a bit improved.  Memory appears to be improving.  Still having gait issues; needs to ambulate with a walker to prevent falls.  Had a fall outside her home while picking figs and did not have a walker.   Reviewed results of labs with patient and husband.  Taking PCN twice daily   WBC  3.4 hemoglobin 10.1 MCV 104 platelet count 74.  Reticulocyte count 2.6 haptoglobin 60  LDH 159 Creatinine 1.26 albumin 3.6  June 20 2023:  Ultimiris  July 01 2023:  Scheduled follow up for MAHA and colon cancer.   No weight change.  Had some diarrhea but did not use anything for it.  No falls since last visit.  Appetite better.  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  Compliant with prophylactic PCN.  LE edema improved.  Anticipate 6 months of therapy  WBC 4.0 hemoglobin 10.9 MCV 103 platelet count 77.  Reticulocyte count 1.4% LDH 148.  Haptoglobin pending CMP notable for glucose 112 creatinine 0.9 albumin 3.8  Review of Systems  Constitutional:  Positive for appetite change and unexpected weight change. Negative for chills, fatigue and fever.  HENT:   Negative for mouth sores, nosebleeds, sore throat, tinnitus and trouble swallowing.   Eyes:  Negative for eye problems and icterus.       Vision changes:  None  Respiratory:  Negative for chest tightness, hemoptysis, shortness of breath and wheezing.        One month of nonproductive cough.  No history of seasonable allergies  Cardiovascular:  Negative for chest pain and leg swelling.  Gastrointestinal:  Positive for diarrhea and nausea. Negative for constipation, rectal pain and vomiting.  Endocrine: Negative for hot flashes.       Cold intolerance:  none Heat intolerance:  none  Genitourinary:  Negative for bladder incontinence, difficulty urinating, dysuria, frequency, hematuria and nocturia.   Musculoskeletal:  Positive for gait problem. Negative for arthralgias, back pain, myalgias, neck pain and neck stiffness.  Skin:  Negative for itching, rash and wound.  Neurological:  Positive for dizziness, extremity weakness, gait problem and headaches. Negative for numbness, seizures and speech difficulty.  Hematological:  Negative for adenopathy. Bruises/bleeds easily.  Psychiatric/Behavioral:  Positive for confusion.  Negative for sleep disturbance and suicidal ideas. The patient is not nervous/anxious.     MEDICAL HISTORY: Past Medical History:  Diagnosis Date  . Abnormal glucose   . Achilles tendinitis of left lower extremity   . Acute suppurative arthritis due to bacteria (HCC) 03/09/2011   Overview:  Last Assessment & Plan:  Methicillin sensitive staphylococcal aureus septic hip. She clearly does need I&D of the hip. I will have her continue the doxycycline for now but stop it 7 days prior to her surgery to maximize the yield on the cultures in the operating room though I be shocked if we don't find methicillin sensitive staph aureus again.. I agree with removing as much of the pros  . Anal fissure 11/30/2015  . Arthritis    right hip  . Atypical chest pain 04/09/2016  . Bilateral edema of lower extremity   . BMI 39.0-39.9,adult   . Cancer [facility])    SKIN CANCER  . Carpal tunnel syndrome 03/09/2011   Overview:  Last Assessment & Plan:  Clinically this seems to be carpal tunnel syndrome. Giving given her history of infection though we can keep in the back of our mind the idea that she will have a cervical spine problem but I think this is unlikely at this point in time medics carpal tunnel syndrome fits clinically this was a positive Tinel's sign however in for a brace for her.  . Chronic vaginitis   . Chronic venous insufficiency   . Depression   . Dyslipidemia   . Fatigue 10/13/2015  . Fibromyalgia   . GERD without esophagitis   . History of immune thrombocytopenia 03/09/2011  . History of operative procedure on hip 03/10/2012  . History of TTP (thrombotic thrombocytopenic purpura)   . Hypokalemia   . Lichen sclerosus et atrophicus of the vulva 11/30/2015  . Major depressive disorder, recurrent, moderate (HCC)   . Methicillin susceptible Staphylococcus aureus infection 03/09/2011   Overview:  Last Assessment & Plan:  This is undoubtedlythe culprit organism  .  Migraine   . Morbid obesity with  BMI of 45.0-49.9, adult (HCC) 10/13/2015  . Morbidly obese (HCC)   . MSSA (methicillin susceptible Staphylococcus aureus) infection   . Near syncope   . Osteoarthritis, chronic   . Pain due to total hip replacement (HCC) 09/20/2011  . Palpitations 03/04/2019  . Postmenopausal   . 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 Snoring 11/22/2015  . T.T.P. syndrome (HCC)    20 yrs ago-not seen anyone for 10 yrs.  . Vitamin D deficiency   . 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    SURGICAL HISTORY: Past Surgical History:  Procedure Laterality Date  . COLONOSCOPY  03/01/2011   Mild melanosis coli. Mild diverticulosis. Small internal hemorrhoids. Healed anal fissure  . ENDOVENOUS ABLATION SAPHENOUS VEIN W/ LASER Right 04/02/2019   endovenous laser ablation right greater saphenous vein by Waverly Ferrari MD   . JOINT REPLACEMENT  08/2009   right hip replacement  . REVISION TOTAL HIP ARTHROPLASTY    . TONSILLECTOMY  1963  . TOTAL HIP REVISION  10/08/2011   Procedure: TOTAL HIP REVISION;  Surgeon: [name];  Location: [facility];  Service: Orthopedics;  Laterality: Right;  Resection of Right Total Hip/Extended Trochanteric Osteotomy/Placement of Antibiotic Total Hip Cemented by Depuy  . TOTAL HIP REVISION  03/10/2012   Procedure: TOTAL HIP REVISION;  Surgeon: [name], MD;  Location: [facility];  Service: Orthopedics;  Laterality: Right;  Reimplantation/Revision of a Right Total Hip and Removal of Cemented Implant    SOCIAL HISTORY: Social History   Socioeconomic History  . Marital status: Married    Spouse name: Raiford Noble  . Number of children: Not on file  . Years of education: 12+7  . Highest education level: Bachelor's degree (e.g., BA, AB, BS)  Occupational History  . Occupation: Magazine features editor: [employer]  Tobacco Use  . Smoking status: Former    Current packs/day: 0.00    Average packs/day: 1.5 packs/day  for 20.0 years (30.0 ttl pk-yrs)    Types: Cigarettes    Start date: 10/04/1970    Quit date: 10/04/1990    Years since quitting: 32.7  . Smokeless tobacco: Former    Quit date: 03/09/1991  Vaping Use  . Vaping status: Never Used  Substance and Sexual Activity  . Alcohol use: Yes    Comment: OCCASIONAL  . Drug use: No  . Sexual activity: Yes    Partners: Male  Other Topics Concern  . Not on file  Social History Narrative  . Not on file   Social Determinants of Health   Financial Resource Strain: Not on file  Food Insecurity: Low Risk  (02/04/2023)   Received from [facility]   Hunger Vital Sign   . Worried About Programme researcher, broadcasting/film/video in the Last Year: Never true   . Ran Out of Food in the Last Year: Never true  Transportation Needs: Not on file (02/04/2023)  Physical Activity: Not on file  Stress: Not on file  Social Connections: Not on file  Intimate Partner Violence: Not on file    FAMILY HISTORY Family History  Problem Relation Age of Onset  . Hypertension Mother   . Hypertension Father   . Alzheimer's disease Father   . Prostate cancer Father   . Hypertension Maternal Grandfather   . Seizures Other   . Colon cancer Neg Hx   . Stomach cancer Neg Hx   .  Rectal cancer Neg Hx   . Esophageal cancer Neg Hx     ALLERGIES:  has No Known Allergies.  MEDICATIONS:  Current Outpatient Medications  Medication Sig Dispense Refill  . loperamide (IMODIUM) 2 MG capsule Take by mouth.    [name] Kitchen aluminum-magnesium hydroxide 200-200 MG/5ML suspension Take 15 mLs by mouth every 6 (six) hours as needed for indigestion.    . AMBULATORY NON FORMULARY MEDICATION Medication Name: Magic Mouth Wash 3 parts Mylanta/Maalox 2 parts Benadryl 1 part Viscous Lidocaine  Swish and Swallow 5 mL every 3-4 hours as needed (Patient not taking: Reported on 05/30/2023) 240 mL 0  . buPROPion (WELLBUTRIN XL) 150 MG 24 hr tablet Take 150 mg by mouth every evening.    . clobetasol cream  (TEMOVATE) 0.05 % Apply 1 Application topically as needed.    . clotrimazole-betamethasone (LOTRISONE) cream Apply 1 Application topically 2 (two) times daily.    . divalproex (DEPAKOTE) 250 MG DR tablet Take 250 mg by mouth 2 (two) times daily.    . DULoxetine (CYMBALTA) 60 MG capsule Take 60 mg by mouth at bedtime.     [name] Kitchen esomeprazole (NEXIUM) 40 MG capsule Take 40 mg by mouth daily. Pt states has been on for years    . Multiple Vitamin (MULTIVITAMIN WITH MINERALS) TABS tablet Take 1 tablet by mouth daily.    . nortriptyline (PAMELOR) 10 MG capsule Take 10 mg by mouth.    . penicillin v potassium (VEETID) 500 MG tablet Take 1 tablet (500 mg total) by mouth 2 (two) times daily. 180 tablet 3  . potassium chloride SA (KLOR-CON M) 20 MEQ tablet Take 3 tablet today, then start 1 tablet twice daily 64 tablet 0  . SUMAtriptan (IMITREX) 25 MG tablet Take 25 mg by mouth.     No current facility-administered medications for this visit.    PHYSICAL EXAMINATION:  ECOG PERFORMANCE STATUS: 1 - Symptomatic but completely ambulatory   Vitals:   07/01/23 1459  BP: 122/63  Pulse: 88  Resp: 18  Temp: 97.6 F (36.4 C)  SpO2: 100%       Filed Weights   07/01/23 1459  Weight: 204 lb 3.2 oz (92.6 kg)        Physical Exam Vitals and nursing note reviewed.  Constitutional:      General: She is not in acute distress.    Appearance: She is obese. She is not ill-appearing, toxic-appearing or diaphoretic.     Comments: Here with husband.  More robust than last visit  HENT:     Head: Normocephalic and atraumatic.     Right Ear: External ear normal.     Left Ear: External ear normal.     Nose: Nose normal. No congestion or rhinorrhea.     Mouth/Throat:     Mouth: Mucous membranes are dry.     Pharynx: No oropharyngeal exudate or posterior oropharyngeal erythema.  Eyes:     General: No scleral icterus.    Extraocular Movements: Extraocular movements intact.     Conjunctiva/sclera:  Conjunctivae normal.     Pupils: Pupils are equal, round, and reactive to light.  Cardiovascular:     Rate and Rhythm: Normal rate.     Heart sounds: No murmur heard.    No friction rub. No gallop.  Abdominal:     General: Bowel sounds are normal.     Palpations: Abdomen is soft.     Tenderness: There is no abdominal tenderness. There is no guarding or rebound.  Musculoskeletal:        General: No swelling, tenderness or deformity.     Cervical back: Normal range of motion and neck supple. No rigidity or tenderness.     Comments: Bilateral LE edema continues to improve.      Lymphadenopathy:     Head:     Right side of head: No submental, submandibular, tonsillar, preauricular, posterior auricular or occipital adenopathy.     Left side of head: No submental, submandibular, tonsillar, preauricular, posterior auricular or occipital adenopathy.     Cervical: No cervical adenopathy.     Right cervical: No superficial, deep or posterior cervical adenopathy.    Left cervical: No superficial, deep or posterior cervical adenopathy.     Upper Body:     Right upper body: No supraclavicular, axillary, pectoral or epitrochlear adenopathy.     Left upper body: No supraclavicular, axillary, pectoral or epitrochlear adenopathy.  Skin:    General: Skin is warm and dry.     Coloration: Skin is not jaundiced.     Comments: Ecchymoses pretibial regions bilateral  Neurological:     General: No focal deficit present.     Mental Status: She is alert and oriented to person, place, and time.     Cranial Nerves: No cranial nerve deficit.     Comments: Seated in chair.  Much more interactive and conversant.      Psychiatric:     Comments: Forgetful.  Talkative.  Insight not good     LABORATORY DATA: I have personally reviewed the data as listed:  Appointment on 06/17/2023  Component Date Value Ref Range Status  . LDH 06/17/2023 159  98 - 192 U/L Final   Performed at [facility],  [address], [place], [place] [address]  . Retic Ct Pct 06/17/2023 2.6  0.4 - 3.1 % Final  . RBC. 06/17/2023 3.00 (L)  3.87 - 5.11 MIL/uL Final  . Retic Count, Absolute 06/17/2023 78.9  19.0 - 186.0 K/uL Final  . Immature Retic Fract 06/17/2023 8.7  2.3 - 15.9 % Final   Performed at [facility], [address] [place], [place] [address]  . Haptoglobin 06/17/2023 60  37 - 355 mg/dL Final   Comment: (NOTE) Performed At: [facility] [address] [place], [place] [id number] Jolene Schimke MD [id number]   . Sodium 06/17/2023 138  135 - 145 mmol/L Final  . Potassium 06/17/2023 4.4  3.5 - 5.1 mmol/L Final  . Chloride 06/17/2023 101  98 - 111 mmol/L Final  . CO2 06/17/2023 29  22 - 32 mmol/L Final  . Glucose, Bld 06/17/2023 91  70 - 99 mg/dL Final   Glucose reference range applies only to samples taken after fasting for at least 8 hours.  . BUN 06/17/2023 11  8 - 23 mg/dL Final  . Creatinine, Ser 06/17/2023 1.26 (H)  0.44 - 1.00 mg/dL Final  . Calcium 29/51/8841 9.4  8.9 - 10.3 mg/dL Final  . Total Protein 06/17/2023 6.3 (L)  6.5 - 8.1 g/dL Final  . Albumin 66/03/3015 3.6  3.5 - 5.0 g/dL Final  . AST 10/30/3233 23  15 - 41 U/L Final  . ALT 06/17/2023 9  0 - 44 U/L Final  . Alkaline Phosphatase 06/17/2023 49  38 - 126 U/L Final  . Total Bilirubin 06/17/2023 0.7  0.3 - 1.2 mg/dL Final  . GFR, Estimated 06/17/2023 47 (L)  >60 mL/min Final   Comment: (NOTE) Calculated using the CKD-EPI  Creatinine Equation (2021)   . Anion gap 06/17/2023 8  5 - 15 Final   Performed at [facility], [address], [place], [place] [address]  . WBC 06/17/2023 3.4 (L)  4.0 - 10.5 K/uL Final  . RBC 06/17/2023 3.04 (L)  3.87 - 5.11 MIL/uL Final  . Hemoglobin 06/17/2023 10.1 (L)  12.0 - 15.0 g/dL Final  . HCT 13/24/4010 31.7 (L)  36.0 - 46.0 % Final  . MCV 06/17/2023 104.3 (H)  80.0 - 100.0 fL Final  . MCH 06/17/2023 33.2  26.0 - 34.0 pg Final  . MCHC  06/17/2023 31.9  30.0 - 36.0 g/dL Final  . RDW 27/25/3664 17.2 (H)  11.5 - 15.5 % Final  . Platelets 06/17/2023 74 (L)  150 - 400 K/uL Final   Comment: SPECIMEN CHECKED FOR CLOTS Immature Platelet Fraction may be clinically indicated, consider ordering this additional test [id number] REPEATED TO VERIFY   . nRBC 06/17/2023 0.0  0.0 - 0.2 % Final  . Neutrophils Relative % 06/17/2023 48  % Final  . Neutro Abs 06/17/2023 1.6 (L)  1.7 - 7.7 K/uL Final  . Lymphocytes Relative 06/17/2023 28  % Final  . Lymphs Abs 06/17/2023 1.0  0.7 - 4.0 K/uL Final  . Monocytes Relative 06/17/2023 22  % Final  . Monocytes Absolute 06/17/2023 0.7  0.1 - 1.0 K/uL Final  . Eosinophils Relative 06/17/2023 1  % Final  . Eosinophils Absolute 06/17/2023 0.0  0.0 - 0.5 K/uL Final  . Basophils Relative 06/17/2023 1  % Final  . Basophils Absolute 06/17/2023 0.0  0.0 - 0.1 K/uL Final  . Immature Granulocytes 06/17/2023 0  % Final  . Abs Immature Granulocytes 06/17/2023 0.01  0.00 - 0.07 K/uL Final   Performed at [facility], [address], [place], [place] [address]  Appointment on 06/12/2023  Component Date Value Ref Range Status  . LDH 06/12/2023 182  98 - 192 U/L Final   Performed at [facility], [address] [place], [place] [address]  . Retic Ct Pct 06/12/2023 3.9 (H)  0.4 - 3.1 % Final  . RBC. 06/12/2023 3.03 (L)  3.87 - 5.11 MIL/uL Final  . Retic Count, Absolute 06/12/2023 117.6  19.0 - 186.0 K/uL Final  . Immature Retic Fract 06/12/2023 10.0  2.3 - 15.9 % Final   Performed at [facility], [address], [place], [place] [address]  . Haptoglobin 06/12/2023 27 (L)  37 - 355 mg/dL Final   Comment: (NOTE) Performed At: [facility] [address] [place], [place] [id number] Jolene Schimke MD [id number]   . Sodium 06/12/2023 136  135 - 145 mmol/L Final  . Potassium 06/12/2023 4.1  3.5 - 5.1 mmol/L Final  . Chloride 06/12/2023 101  98  - 111 mmol/L Final  . CO2 06/12/2023 27  22 - 32 mmol/L Final  . Glucose, Bld 06/12/2023 95  70 - 99 mg/dL Final   Glucose reference range applies only to samples taken after fasting for at least 8 hours.  . BUN 06/12/2023 13  8 - 23 mg/dL Final  . Creatinine 73/22/0254 0.97  0.44 - 1.00 mg/dL Final  . Calcium 27/03/2375 8.8 (L)  8.9 - 10.3 mg/dL Final  . Total Protein 06/12/2023 6.2 (L)  6.5 - 8.1 g/dL Final  . Albumin 28/31/5176 3.4 (L)  3.5 - 5.0 g/dL Final  . AST 16/04/3709 25  15 - 41 U/L Final  . ALT 06/12/2023  12  0 - 44 U/L Final  . Alkaline Phosphatase 06/12/2023 54  38 - 126 U/L Final  . Total Bilirubin 06/12/2023 0.6  0.3 - 1.2 mg/dL Final  . GFR, Estimated 06/12/2023 >60  >60 mL/min Final   Comment: (NOTE) Calculated using the CKD-EPI Creatinine Equation (2021)   . Anion gap 06/12/2023 8  5 - 15 Final   Performed at [facility], [address], [place], [place] [address]  . WBC Count 06/12/2023 3.3 (L)  4.0 - 10.5 K/uL Final  . RBC 06/12/2023 3.02 (L)  3.87 - 5.11 MIL/uL Final  . Hemoglobin 06/12/2023 10.1 (L)  12.0 - 15.0 g/dL Final  . HCT 60/45/4098 31.6 (L)  36.0 - 46.0 % Final  . MCV 06/12/2023 104.6 (H)  80.0 - 100.0 fL Final  . MCH 06/12/2023 33.4  26.0 - 34.0 pg Final  . MCHC 06/12/2023 32.0  30.0 - 36.0 g/dL Final  . RDW 11/91/4782 18.9 (H)  11.5 - 15.5 % Final  . Platelet Count 06/12/2023 70 (L)  150 - 400 K/uL Final   Comment: SPECIMEN CHECKED FOR CLOTS Immature Platelet Fraction may be clinically indicated, consider ordering this additional test [id number] REPEATED TO VERIFY PLATELET COUNT CONFIRMED BY SMEAR   . nRBC 06/12/2023 0.0  0.0 - 0.2 % Final  . Neutrophils Relative % 06/12/2023 54  % Final  . Neutro Abs 06/12/2023 1.8  1.7 - 7.7 K/uL Final  . Lymphocytes Relative 06/12/2023 24  % Final  . Lymphs Abs 06/12/2023 0.8  0.7 - 4.0 K/uL Final  . Monocytes Relative 06/12/2023 19  % Final  . Monocytes Absolute 06/12/2023 0.6  0.1 -  1.0 K/uL Final  . Eosinophils Relative 06/12/2023 1  % Final  . Eosinophils Absolute 06/12/2023 0.0  0.0 - 0.5 K/uL Final  . Basophils Relative 06/12/2023 1  % Final  . Basophils Absolute 06/12/2023 0.0  0.0 - 0.1 K/uL Final  . Immature Granulocytes 06/12/2023 1  % Final  . Abs Immature Granulocytes 06/12/2023 0.02  0.00 - 0.07 K/uL Final   Performed at [facility], [address], [place], [place] [address]  Clinical Support on 06/03/2023  Component Date Value Ref Range Status  . WBC MORPHOLOGY 06/03/2023 MORPHOLOGY UNREMARKABLE   Final  . RBC MORPHOLOGY 06/03/2023 ELLIPTOCYTES   Final   Comment: POLYCHROMASIA PRESENT TEARDROP CELLS   . Clinical Information 06/03/2023 thrombocytopenia   Final   Performed at [facility], [address], [place], [place] [address]  . C3 Complement 06/03/2023 96  82 - 167 mg/dL Final  . Complement C4, Body Fluid 06/03/2023 32  12 - 38 mg/dL Final   Comment: (NOTE) Performed At: [facility] [address] [place], [place] [id number] Jolene Schimke MD [id number]   . PTT Lupus Anticoagulant 06/03/2023 35.7  0.0 - 43.5 sec Final  . DRVVT 06/03/2023 38.5  0.0 - 47.0 sec Final  . Lupus Anticoag Interp 06/03/2023 Comment:   Corrected   Comment: (NOTE) No lupus anticoagulant was detected. Performed At: [facility] [address] [place], [place] [id number] Jolene Schimke MD [id number]   . Hepatitis B Surface Ag 06/03/2023 NON REACTIVE  NON REACTIVE Final  . HCV Ab 06/03/2023 Reactive (A)  NON REACTIVE Final  . Hep A IgM 06/03/2023 NON REACTIVE  NON REACTIVE Final  . Hep B C IgM 06/03/2023 NON REACTIVE  NON REACTIVE Final   Performed at [facility], [address]  [place], [place] [address]  . Heparin Induced Plt Ab 06/03/2023 0.037  0.000 - 0.400 OD Final   Comment: (NOTE) Performed At: [facility] [address] [place], [place] [id number] Jolene Schimke MD  [id number]   . Beta-2 Glyco I IgG 06/03/2023 <9  0 - 20 GPI IgG units Final   Comment: (NOTE) The reference interval reflects a 3SD or 99th percentile interval, which is thought to represent a potentially clinically significant result in accordance with the International Consensus Statement on the classification criteria for definitive antiphospholipid syndrome (APS). J Thromb Haem 2006;4:295-306.   . Beta-2-Glycoprotein I IgM 06/03/2023 <9  0 - 32 GPI IgM units Final   Comment: (NOTE) The reference interval reflects a 3SD or 99th percentile interval, which is thought to represent a potentially clinically significant result in accordance with the International Consensus Statement on the classification criteria for definitive antiphospholipid syndrome (APS). J Thromb Haem 2006;4:295-306. Performed At: [facility] [address] [place], [place] [id number] Jolene Schimke MD [id number]   . Beta-2-Glycoprotein I IgA 06/03/2023 <9  0 - 25 GPI IgA units Final   Comment: (NOTE) The reference interval reflects a 3SD or 99th percentile interval, which is thought to represent a potentially clinically significant result in accordance with the International Consensus Statement on the classification criteria for definitive antiphospholipid syndrome (APS). J Thromb Haem 2006;4:295-306.   [name] Kitchen Anticardiolipin IgG 06/03/2023 <9  0 - 14 GPL U/mL Final   Comment: (NOTE)                          Negative:              <15                          Indeterminate:     15 - 20                          Low-Med Positive: >20 - 80                          High Positive:         >80   . Anticardiolipin IgM 06/03/2023 9  0 - 12 MPL U/mL Final   Comment: (NOTE)                          Negative:              <13                          Indeterminate:     13 - 20                          Low-Med Positive: >20 - 80                          High Positive:         >80 Performed At: [facility] [address] [place], [place] [id number] Jolene Schimke MD [id number]   . ds DNA Ab 06/03/2023 1  0 - 9 IU/mL Final   Comment: (NOTE)  Negative      <5                                   Equivocal  5 - 9                                   Positive      >9   . Ribonucleic Protein 06/03/2023 <0.2  0.0 - 0.9 AI Final  . ENA SM Ab Ser-aCnc 06/03/2023 <0.2  0.0 - 0.9 AI Final  . Scleroderma (Scl-70) (ENA) Antibod* 06/03/2023 <0.2  0.0 - 0.9 AI Final  . SSA (Ro) (ENA) Antibody, IgG 06/03/2023 <0.2  0.0 - 0.9 AI Final  . SSB (La) (ENA) Antibody, IgG 06/03/2023 <0.2  0.0 - 0.9 AI Final  . Chromatin Ab SerPl-aCnc 06/03/2023 0.2  0.0 - 0.9 AI Final  . Anti JO-1 06/03/2023 <0.2  0.0 - 0.9 AI Final  . Centromere Ab Screen 06/03/2023 <0.2  0.0 - 0.9 AI Final  . See below: 06/03/2023 Comment   Final   Comment: (NOTE) Autoantibody                       Disease Association ------------------------------------------------------------                        Condition                  Frequency ---------------------   ------------------------   --------- Antinuclear Antibody,    SLE, mixed connective Direct (ANA-D)           tissue diseases ---------------------   ------------------------   --------- dsDNA                    SLE                        40 - 60% ---------------------   ------------------------   --------- Chromatin                Drug induced SLE                90%                         SLE                        48 - 97% ---------------------   ------------------------   --------- SSA (Ro)                 SLE                        25 - 35%                         Sjogren's Syndrome         40 - 70%                         Neonatal Lupus                 100% ---------------------   ------------------------   --------- SSB (La)  SLE                                                       10%                          Sjogren's Syndrome              30% ---------------------   -----------------------    --------- Sm (anti-Smith)          SLE                        15 - 30% ---------------------   -----------------------    --------- RNP                      Mixed Connective Tissue                         Disease                         95% (U1 nRNP,                SLE                        30 - 50% anti-ribonucleoprotein)  Polymyositis and/or                         Dermatomyositis                 20% ---------------------   ------------------------   --------- Scl-70 (antiDNA          Scleroderma (diffuse)      20 - 35% topoisomerase)           Crest                           13% ---------------------   ------------------------   --------- Jo-1                     Polymyositis and/or                         Dermatomyositis            20 - 40% ---------------------   ------------------------   --------- Centromere B             Scleroderma -                           Crest                         variant                         80% Performed At: [facility] [employer] [address] [place], [place] [id number] Jolene Schimke MD [id number]   . Retic Ct Pct 06/03/2023 5.8 (H)  0.4 - 3.1 % Final  . RBC. 06/03/2023 2.98 (L)  3.87 - 5.11 MIL/uL Final  . Retic Count, Absolute 06/03/2023  173.1  19.0 - 186.0 K/uL Final  . Immature Retic Fract 06/03/2023 21.2 (H)  2.3 - 15.9 % Final   Performed at [facility], [address], [place], [place] [address]  . Haptoglobin 06/03/2023 <10 (L)  37 - 355 mg/dL Final   Comment: (NOTE) Performed At: [facility] [address] [place], [place] [id number] Jolene Schimke MD [id number]   . HCV Ab 06/03/2023 Reactive (A)  Non Reactive Final   Comment: (NOTE) Performed At: [facility] [address] [place], [place] [id number] Jolene Schimke MD [id number]   . Hepatitis C Quantitation 06/03/2023 HCV Not Detected  IU/mL  Final  . Test Information (HCV): 06/03/2023 Comment   Final   Comment: (NOTE) The quantitative range of this assay is 15 IU/mL to 100 million IU/mL.   [name] Kitchen Interpretation (HCV): 06/03/2023 Comment   Final   Comment: (NOTE) Positive HCV antibody screen without the presence of HCV RNA is consistent with a resolved past infection or a false positive HCV antibody. Consider repeat testing after one month. Performed At: [facility] [address] [place], [place] [id number] Jolene Schimke MD [id number]   Office Visit on 06/03/2023  Component Date Value Ref Range Status  . WBC 06/03/2023 3.3 (L)  4.0 - 10.5 K/uL Final  . RBC 06/03/2023 2.99 (L)  3.87 - 5.11 MIL/uL Final  . Hemoglobin 06/03/2023 9.7 (L)  12.0 - 15.0 g/dL Final  . HCT 56/38/7564 30.4 (L)  36.0 - 46.0 % Final  . MCV 06/03/2023 101.7 (H)  80.0 - 100.0 fL Final  . MCH 06/03/2023 32.4  26.0 - 34.0 pg Final  . MCHC 06/03/2023 31.9  30.0 - 36.0 g/dL Final  . RDW 33/29/5188 21.6 (H)  11.5 - 15.5 % Final  . Platelets 06/03/2023 75 (L)  150 - 400 K/uL Final   Comment: SPECIMEN CHECKED FOR CLOTS Immature Platelet Fraction may be clinically indicated, consider ordering this additional test [id number] REPEATED TO VERIFY PLATELET COUNT CONFIRMED BY SMEAR   . nRBC 06/03/2023 0.0  0.0 - 0.2 % Final  . Neutrophils Relative % 06/03/2023 47  % Final  . Neutro Abs 06/03/2023 1.5 (L)  1.7 - 7.7 K/uL Final  . Lymphocytes Relative 06/03/2023 27  % Final  . Lymphs Abs 06/03/2023 0.9  0.7 - 4.0 K/uL Final  . Monocytes Relative 06/03/2023 24  % Final  . Monocytes Absolute 06/03/2023 0.8  0.1 - 1.0 K/uL Final  . Eosinophils Relative 06/03/2023 1  % Final  . Eosinophils Absolute 06/03/2023 0.0  0.0 - 0.5 K/uL Final  . Basophils Relative 06/03/2023 0  % Final  . Basophils Absolute 06/03/2023 0.0  0.0 - 0.1 K/uL Final  . Immature Granulocytes 06/03/2023 1  % Final  . Abs Immature Granulocytes 06/03/2023 0.04  0.00 - 0.07 K/uL Final   . Ovalocytes 06/03/2023 PRESENT   Final   Performed at [facility], [address] [place], [place] [address]    RADIOGRAPHIC STUDIES: I have personally reviewed the radiological images as listed and agree with the findings in the report  No results found.  ASSESSMENT/PLAN  67 y.o. female is here because of  colon cancer.  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 Mass was partially circumferential (involves < 1/3rd of the lumen circumference). 10 mm polyp in proximal ascending colon which was sessile.  Pathology ulcerated mass- invasive moderately differentiated adenocarcinoma arising within a tubular adenoma with high-grade dysplasia   January 15 2023- CEA 2.9 Chromogranin A 564  January 17 2023- CT CAP  Short segment asymmetric wall thickening of the transverse colon measuring 3.3 cm in length.   Small adjacent soft tissue nodule/lymph nodes adjacent to the colonic wall thickening measuring 6 mm, suspicious  for local nodal disease. Prominent/mildly enlarged right upper quadrant lymph nodes similar to January 26, 2019 and favored reactive.  No distant metastatic disease.  Hepatomegaly with nodular hepatic contour, suggestive of cirrhosis Cholelithiasis without findings of acute cholecystitis.   Incidental right thyroid nodule   January 23 2023- Chromogranin A 294  February 04 2023:  Transverse colectomy.  Pathology showed adenocarcinoma invading visceral peritoneum, Grade 3.  All of 30 lymph nodes negative for tumor.  MSI intact Feb 25 2023- On basis of spontaneous improvement in chromogranin A will hold on  Dodatate PET.     Therapeutics-  Feb 25 2023- Reviewed NCCN treatment guidelines with patient and husband.  They are agreeable to proceed with adjuvant CAPOX x 6 months.   Mar 11 2023- Cycle 1 XelOx Mar 20 2023- Tolerated cycle 1 well with only some mild cold neuropathy April 01 2023- Cycle 2 XelOx   April 22 2023: Cycle 3 XelOx.              April 30 2023- Experiencing mucositis, anorexia, nausea and diarrhea.    May 13 2023: Cycle 4 XelOX.  Dose reduced Xeloda from 2000 mg bid to 1500 mg bid due to GI symptoms.  This will be her last cycle of adjuvant therapy  May 23 2023- Stopping Xeloda due to side effects and referring for admission.  Contacted ED attending   Chemotherapy induced nausea and diarrhea             April 30 2023-  Instructed patient to do the following-- Take Ondansetron 8 mg, four times daily.  Take Imodium 4 mg every 4 hrs for diarrhea Use the magic mouthwash every 4 hours while awake.  Arranged for patient to receive IVF and antiemetics.  To follow up in 2 days to assess progress     May 02 2023- To complete this course of Xeloda on July 15th.  Should improve with the week off between then and beginning of Cycle 4.  Anticipate dose reduction in Xeloda with Cycle 4.  Sx under better control today than at last visit due to better use of supportive care May 08 2023-  Continues to have 5 to 6 bowel movements daily.  Using imodium 2 mg q 5 hrs.  Instructed patient to use imodium 4 mg po q 4 hrs PRN diarrhea Adding lomotil up to qid PRN.  Has required potassium replacement due to diarrheal losses May 21 2023- Continues to have significant chemotherapy induced diarrhea.  Not using supportive care medications to maximum potential. Provided written instructions to patient and husband again today.  Adding Levaquin 500 mg daily.  Sending stool for C diff.  Referring to infusion for IVF.  Chemistries sent to guide electrolyte replacement.  Follow up in 2 days.   Sent testing to evaluate for  DPD deficiency.  Discussed plans with clinical pharmacist.   May 23 2023- Diarrhea improved with  use of scheduled imodium, lomotil, ondansetron and since starting Levaquin daily.  Remains dehydrated as judged by exam (mucosa and skin dry, tachycardic)  May 30 2023- Diarrhea persists. Began empiric Levaquin/Flagyl for diverticulitis/gastroenteritis   Mental status changes May 23 2023- Likely multifactorial- 1) psychotropic medications (welbutrin, cymbalta, nortryptiline, depakote)  2) supportive care medications (Flexeril, lomotil, imodium, ondansetron)  3) chemotherapy (Xeloda) 4) Dehydration 5) underlying liver disease  Recommended admission via ED for evaluation and management.  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  Reviewed medication list to remove non-essential agents; particularly those with CNS side effects  June 17 2023- Mental status improving.    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  GI pathogen panel negative negative for Shiga toxin and bacteria known to produce Shiga Toxin              May 30 2023- Laboratory evaluation notable for Hgb 10.1 PLT 84, LDH 255.  DAT negative.  Haptoglobin < 10.  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  4) MAHA in setting of oxaliplatin use has been reported and if no improvement upon simply withdrawing the drug then it should be treated as  atypical HUS.     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  Arranging for treatment with Ultomiris.  Arranged for immunization against meningococcus and begin PCN prophylaxis June 05 2023- Meningococcal vaccine June 06 2023- Ultomiris load June 10 2023- GI symptoms improving.  June 09 2023- Retic count improved to 2.6.  LDH and haptoglobin normal.  These changes along with improvement in GI Sx and mental status indicate response to treatment June 20 2023: Ultimiris  July 01, 2023-hemoglobin improved to 10.9.  Platelet count slightly improved at 77.  LDH improved at 140 albumin improved at 3.8.  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  At that time TTP and HUS were distinguishable only on clinical grounds and were thought of as being almost the same disease.  The entity of atypical HUS was not known at the time.  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  No evidence of inflammatory bowel disease by colonoscopy.    Chromogranin A 564  January 23 2023- Repeat Chromogranin A improved  Feb 25 2023- Diarrhea and chromogranin A improved therefore holding on Dotatate PET/CT  Poor venous access:    Mar 11 2023- Port placement   Headaches Mar 07 2023- Improved following surgery likely related to a component of general anesthesia.  To see neurology  Possible cirrhosis  January 17 2023- Nodular liver contour noted on CT CAP    Cancer Staging  Colon cancer [facility]) Staging form: Colon and Rectum, AJCC 8th Edition - Clinical stage from 02/25/2023: Stage IIB (cT4a, cN0, cM0) - Signed by Loni Muse, MD on 02/25/2023 Histopathologic type: Adenocarcinoma,  NOS Stage prefix: Initial diagnosis Total positive nodes: 0 Total nodes examined: 30 Histologic grade (G): G3 Histologic grading system: 4 grade system    No problem-specific Assessment & Plan notes found for this encounter.    No orders of the defined types were placed in this encounter.   30  minutes was spent in patient care.  This included time spent preparing to see the patient (e.g., review of tests), obtaining and/or reviewing separately obtained history, counseling and educating the patient/family/caregiver, ordering medications, tests, or procedures; documenting clinical information in the electronic or other health record, independently interpreting results and communicating results to the patient/family/caregiver as well as coordination of care.       All questions were answered. The patient knows to call the clinic with any problems, questions or concerns.  This note was electronically signed.    Loni Muse, MD  07/01/2023 3:09 PM

## 2023-07-01 NOTE — Telephone Encounter (Signed)
Information given to patient and husband.

## 2023-07-02 ENCOUNTER — Encounter: Payer: Self-pay | Admitting: Oncology

## 2023-07-02 DIAGNOSIS — K219 Gastro-esophageal reflux disease without esophagitis: Secondary | ICD-10-CM | POA: Diagnosis not present

## 2023-07-02 DIAGNOSIS — L309 Dermatitis, unspecified: Secondary | ICD-10-CM | POA: Diagnosis not present

## 2023-07-02 DIAGNOSIS — I129 Hypertensive chronic kidney disease with stage 1 through stage 4 chronic kidney disease, or unspecified chronic kidney disease: Secondary | ICD-10-CM | POA: Diagnosis not present

## 2023-07-02 DIAGNOSIS — E669 Obesity, unspecified: Secondary | ICD-10-CM | POA: Diagnosis not present

## 2023-07-02 DIAGNOSIS — F325 Major depressive disorder, single episode, in full remission: Secondary | ICD-10-CM | POA: Diagnosis not present

## 2023-07-02 DIAGNOSIS — F419 Anxiety disorder, unspecified: Secondary | ICD-10-CM | POA: Diagnosis not present

## 2023-07-02 DIAGNOSIS — N182 Chronic kidney disease, stage 2 (mild): Secondary | ICD-10-CM | POA: Diagnosis not present

## 2023-07-02 DIAGNOSIS — M1990 Unspecified osteoarthritis, unspecified site: Secondary | ICD-10-CM | POA: Diagnosis not present

## 2023-07-02 DIAGNOSIS — I872 Venous insufficiency (chronic) (peripheral): Secondary | ICD-10-CM | POA: Diagnosis not present

## 2023-07-02 LAB — HAPTOGLOBIN: Haptoglobin: 82 mg/dL (ref 37–355)

## 2023-07-02 NOTE — Progress Notes (Addendum)
Oral Chemotherapy Pharmacist Encounter  I spoke with patient for overview of: Xeloda (capecitabine) for the treatment of colon cancer in conjunction with infusional oxaliplatin, planned duration of 4 cycles of therapy.   Counseled patient on administration, dosing, side effects, monitoring, drug-food interactions, safe handling, storage, and disposal.  Patient will take Xeloda 500mg  tablets, 4 tablets (2000mg ) by mouth in AM and 4 tabs (2000mg ) by mouth in PM, within 30 minutes of finishing meals, on days 1-14 of each 21 day cycle.   Oxaliplatin will be infused on day 1 of each 21 day cycle.  Xeloda and oxaliplatin start date: 03/11/23  Adverse effects include but are not limited to: fatigue, decreased blood counts, GI upset, diarrhea, mouth sores, and hand-foot syndrome.  Patient has anti-emetic on hand and knows to take it if nausea develops.   Patient will obtain anti diarrheal and alert the office of 4 or more loose stools above baseline.  Reviewed with patient importance of keeping a medication schedule and plan for any missed doses. No barriers to medication adherence identified.  Medication reconciliation performed and medication/allergy list updated.  Insurance authorization for Xeloda has been obtained. Test claim at the pharmacy revealed copayment $50 for 1st fill of 21 days. This will ship from the [facility] outpatient pharmacy on 03/06/2023 to deliver to patient's home on 03/08/2023.  Patient informed the pharmacy will reach out 5-7 days prior to needing next fill of Xeloda to coordinate continued medication acquisition to prevent break in therapy.  All questions answered.  Patient voiced understanding and appreciation.   Medication education handout placed in mail for patient. Patient knows to call the office with questions or concerns. Oral Chemotherapy Clinic phone number provided to patient.   Bethel Born, PharmD Hematology/Oncology Clinical Pharmacist [facility] [phone]

## 2023-07-09 ENCOUNTER — Other Ambulatory Visit: Payer: Self-pay

## 2023-07-16 DIAGNOSIS — I959 Hypotension, unspecified: Secondary | ICD-10-CM | POA: Diagnosis not present

## 2023-07-16 DIAGNOSIS — R531 Weakness: Secondary | ICD-10-CM | POA: Diagnosis not present

## 2023-07-16 DIAGNOSIS — M25571 Pain in right ankle and joints of right foot: Secondary | ICD-10-CM | POA: Diagnosis not present

## 2023-07-16 DIAGNOSIS — Z6833 Body mass index (BMI) 33.0-33.9, adult: Secondary | ICD-10-CM | POA: Diagnosis not present

## 2023-07-22 ENCOUNTER — Inpatient Hospital Stay: Payer: Medicare PPO

## 2023-07-22 ENCOUNTER — Inpatient Hospital Stay: Payer: Medicare PPO | Admitting: Oncology

## 2023-07-22 VITALS — BP 121/74 | HR 88 | Temp 97.5°F | Resp 14 | Ht 65.9 in | Wt 202.9 lb

## 2023-07-22 DIAGNOSIS — D594 Other nonautoimmune hemolytic anemias: Secondary | ICD-10-CM | POA: Diagnosis not present

## 2023-07-22 DIAGNOSIS — Z09 Encounter for follow-up examination after completed treatment for conditions other than malignant neoplasm: Secondary | ICD-10-CM

## 2023-07-22 DIAGNOSIS — T50905S Adverse effect of unspecified drugs, medicaments and biological substances, sequela: Secondary | ICD-10-CM | POA: Diagnosis not present

## 2023-07-22 DIAGNOSIS — F05 Delirium due to known physiological condition: Secondary | ICD-10-CM | POA: Diagnosis not present

## 2023-07-22 DIAGNOSIS — Z79899 Other long term (current) drug therapy: Secondary | ICD-10-CM | POA: Diagnosis not present

## 2023-07-22 DIAGNOSIS — M311 Thrombotic microangiopathy, unspecified: Secondary | ICD-10-CM | POA: Diagnosis not present

## 2023-07-22 DIAGNOSIS — D5939 Other hemolytic-uremic syndrome: Secondary | ICD-10-CM

## 2023-07-22 DIAGNOSIS — C184 Malignant neoplasm of transverse colon: Secondary | ICD-10-CM

## 2023-07-22 DIAGNOSIS — Z87891 Personal history of nicotine dependence: Secondary | ICD-10-CM | POA: Diagnosis not present

## 2023-07-22 DIAGNOSIS — M3119 Other thrombotic microangiopathy: Secondary | ICD-10-CM

## 2023-07-22 LAB — CBC WITH DIFFERENTIAL/PLATELET
Abs Immature Granulocytes: 0.01 10*3/uL (ref 0.00–0.07)
Basophils Absolute: 0 10*3/uL (ref 0.0–0.1)
Basophils Relative: 0 %
Eosinophils Absolute: 0 10*3/uL (ref 0.0–0.5)
Eosinophils Relative: 1 %
HCT: 33 % — ABNORMAL LOW (ref 36.0–46.0)
Hemoglobin: 10.7 g/dL — ABNORMAL LOW (ref 12.0–15.0)
Immature Granulocytes: 0 %
Lymphocytes Relative: 32 %
Lymphs Abs: 1.3 10*3/uL (ref 0.7–4.0)
MCH: 32.3 pg (ref 26.0–34.0)
MCHC: 32.4 g/dL (ref 30.0–36.0)
MCV: 99.7 fL (ref 80.0–100.0)
Monocytes Absolute: 0.5 10*3/uL (ref 0.1–1.0)
Monocytes Relative: 12 %
Neutro Abs: 2.2 10*3/uL (ref 1.7–7.7)
Neutrophils Relative %: 55 %
Platelets: 109 10*3/uL — ABNORMAL LOW (ref 150–400)
RBC: 3.31 MIL/uL — ABNORMAL LOW (ref 3.87–5.11)
RDW: 12.2 % (ref 11.5–15.5)
WBC: 4.1 10*3/uL (ref 4.0–10.5)
nRBC: 0 % (ref 0.0–0.2)

## 2023-07-22 LAB — COMPREHENSIVE METABOLIC PANEL
ALT: 18 U/L (ref 0–44)
AST: 27 U/L (ref 15–41)
Albumin: 3.8 g/dL (ref 3.5–5.0)
Alkaline Phosphatase: 74 U/L (ref 38–126)
Anion gap: 10 (ref 5–15)
BUN: 14 mg/dL (ref 8–23)
CO2: 27 mmol/L (ref 22–32)
Calcium: 9.4 mg/dL (ref 8.9–10.3)
Chloride: 103 mmol/L (ref 98–111)
Creatinine, Ser: 0.88 mg/dL (ref 0.44–1.00)
GFR, Estimated: >60 mL/min (ref >60)
Glucose, Bld: 104 mg/dL — ABNORMAL HIGH (ref 70–99)
Potassium: 4 mmol/L (ref 3.5–5.1)
Sodium: 140 mmol/L (ref 135–145)
Total Bilirubin: 0.7 mg/dL (ref 0.3–1.2)
Total Protein: 6.8 g/dL (ref 6.5–8.1)

## 2023-07-22 LAB — LACTATE DEHYDROGENASE: LDH: 142 U/L (ref 98–192)

## 2023-07-22 NOTE — Progress Notes (Signed)
[facility] Cancer Follow up Visit:  Patient Care Team: Hurshel Party, NP as PCP - General (Internal Medicine) Loni Muse, MD as Consulting Physician (Internal Medicine)  CHIEF COMPLAINTS/PURPOSE OF CONSULTATION:  Oncology History  Colon cancer [facility])  01/15/2023 Initial Diagnosis   Colon cancer (HCC)   02/25/2023 Cancer Staging   Staging form: Colon and Rectum, AJCC 8th Edition - Clinical stage from 02/25/2023: Stage IIB (cT4a, cN0, cM0) - Signed by Loni Muse, MD on 02/25/2023 Histopathologic type: Adenocarcinoma, NOS Stage prefix: Initial diagnosis Total positive nodes: 0 Total nodes examined: 30 Histologic grade (G): G3 Histologic grading system: 4 grade system   03/11/2023 - 05/13/2023 Chemotherapy   Patient is on Treatment Plan : COLORECTAL Xelox (Capeox)(130/850) q21d       HISTORY OF PRESENTING ILLNESS: [name] 67 y.o. female is here because of  colon cancer Medical history notable for septic arthritis of the hip, obesity, atypical chest pain, skin cancer, carpal tunnel syndrome, chronic vaginitis, chronic venous and sufficiency, TTP, syncope  January 10, 2023: Colonoscopy-ulcerated 2 cm nonobstructing small mass with heaped up margins found in distal transverse colon.  Mass was partially circumferential (involves less than one third of the lumen circumference) no bleeding present.  10 mm polyp in proximal ascending colon which was sessile.  Pathology-Ascending colon polyp was compatible with a sessile serrated adenoma without cytologic dysplasia Transverse colon polyp-invasive moderately differentiated adenocarcinoma arising within a tubular adenoma with high-grade dysplasia  January 15 2023: [facility] Medical Oncology Consult Patient reports that the colonoscopy was performed for surveillance; however, she was also having episodes of intermittent diarrhea x 2 to 3 months.  Stools were not bloody, nor was she passing mucus but they were soft.  In  association with these episodes she would also feel dizzy and note an odd smell, nausea.   To see Dr. Logan Bores from surgery on January 22 2023   Social:  Married.  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  Tobacco quit 24 years.  Rare glass of wine  [facility] Mother alive 55 epileptic, dementia Father died 62 Alzheimers, prostate cancer Brother alive 58 arthritis requiring joint replacement,   WBC 6.4 hemoglobin 12.5 MCV 88 platelet count 178 normal differential CMP normal CEA 2.9 chromogranin A 564  January 17 2023:  CT CAP Short segment asymmetric wall thickening of the transverse colon measuring 3.3 cm in length, compatible with reported history of  primary colonic neoplasm.  Small adjacent soft tissue nodule/lymph nodes adjacent to the colonic wall thickening measuring 6 mm, nonspecific are suspicious  for local nodal disease. Prominent/mildly enlarged right upper quadrant lymph nodes are similar dating back to January 26, 2019 and favored reactive.  No convincing evidence of distant metastatic disease within the chest, abdomen or pelvis.  Hepatomegaly with nodular hepatic contour, suggestive of cirrhosis Cholelithiasis without findings of acute cholecystitis.   Incidental right thyroid nodule measuring   January 18 2023:  Presented to [facility] ED with headaches and chest discomfort.  CT PA negative for PE.  CT head without contrast negative.    January 22 2023:  Scheduled follow up for colon cancer.  Reviewed results of labs and imaging with patient and husband. Will repeat chromgranin A and refer for PET/CT if chromogranin A still significantly elevated.    January 23, 2023: Chromogranin A 294  February 04 2023:  Transverse colectomy Pathology showed adenocarcinoma invading visceral peritoneum, Grade 3.  All of 30 lymph nodes were negative for tumor.  Surgical Stage (T4a, N0 M0)  MSI intact  Feb 25, 2023:   Reviewed results of surgical pathology with patient and husband.  Per NCCN guidelines adjuvant  options are CAPEOX, FOLFOX 6 months, Capecitabine or observation.  Discussed risks and benefits of each  Mar 07 2023:   Bifrontal headaches have returned.  These began in October 2023 but resolved after her colon surgery.  Has seen neurology and underwent MRI brain negative for CNS lesion.  EEG normal.   (Raises the question of what medications in perioperative period helped with the HA's.)  Recovering from a dog bite on dorsum of right hand.  Will receive Capecitabine today.    Mar 11 2023:  Port placement.  Cycle 1 XelOx   Mar 19 2023:  Neurology follow up.    Mar 20 2023:   Agree with Neurology assessment that neurocognitive testing should not be performed it at all, until well after chemotherapy has been completed.  Experienced some mild nausea helped by antiemetics.   Has occasional HA's.  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   No HFS.     April 01 2023:  Cycle 2 XelOx  April 17 2023:  Has lost 4 lbs.  Appetite diminished due to dysgeusia.  No metallic taste.  Edges of tongue get red and sore and painful.  Mouth feels dry despite drinking a lot of water and tea.  Not using anything for her mouth sores.   Fatigue increased.  Has cold neuropathy and cold induced pharyngospasm.      Begin Magic mouthwash for mucositis.     April 22 2023:  Cycle 3 XelOx.    April 30 2023:    Patient called office yesterday stating that she was anorectic, nauseated, having mouth sores and diarrhea.  Has lost 13 lbs.  Has difficulty remembering details which complicates history taking.  States that anorexia began after starting this round of chemotherapy.  "Food does not get past my tongue"; did not recognize this was nausea so did not begin taking antiemetics until 3 days ago.  Only taking antiemetics bid.  Mouth feels dry but not painful.  No emesis.  Began with diarrhea in the form of 5 to 6 stools per day but does not believe that she is taking imodium for it.  Dysgeusia  even to water.   Not using magic mouthwash regularly  May 02 2023:    Diarrhea improved with imodium.  Using ondansetron alternating with compazine; no emesis with improvement in nausea.  Eating limited by mucositis which she can't say is helped by magic mouthwash.  Not eating cold foods.  Has gained 2 lbs in the last two days.  To complete the course of Xeloda on May 06 2023.    WBC 4.7 hemoglobin 11.6 platelet count 61; 60 segs 23 lymphs 13 monos 4 eos CMP notable for potassium 3.4 glucose 103  May 08 2023:   Scheduled follow-up regarding colon cancer. Has lost 4 lbs since last visit owing to anorexia.   No mouth sores but has dysgeusia and mouth feels dry.  Using magic mouthwash.  Nauseated but without emesis.  Continues to have 5 to 6 bowel movements daily.  Using imodium 2 mg q 5 hrs.    Instructed patient to use imodium 4 mg po q 4 hrs PRN diarrhea Adding lomotil up to qid PRN Xeloda has not been shipped to patient.   Will dose reduce Xeloda from 2000 mg bid to  1500 mg bid.    May 13 2023:  Cycle 4 XelOX.  Received total of 1 liter IVF during that visit due to hypotension, potassium due to hypokalemia.  Patient was told to increase dose of supplemental potassium  May 21 2023:  Seen as a work in due to side effects from chemotherapy.  Has lost 11 lbs since last visit.  Has been experiencing liquid stools sometimes almost hourly.  No nausea, emesis, mouth sores. Abdominal cramping. No fevers.  Appetite decreased.   Taking imodium 2 mg bid.  Not certain if she is taking lomotil.   Received IVF, IV potassium in infusion center WBC 3.8 hemoglobin 10.4 platelet count 50 CMP notable for potassium 3.4 AST 38 albumin 3.8 magnesium 1.7 DPD 5-fluorouracil toxicity panel sent  May 22 2023:  Fell backwards on steps outside.  Struck right elbow and knee but not head.  Skin tear to right elbow.   May 23, 2023: Scheduled follow-up to monitor side effects from chemotherapy.  Anorectic but eating  some.  Has gained 1 lbs.  Today experienced flash HA and accompanied by chest pain which last 2 to 4 minutes.  Yesterday had three diarrheal stools.  Significant improvement since taking Ondansetron, Lomotil and Imodium on scheduled basis.  Spending most of time sleeping.  Lost balance twice today.  Will stop Xeloda and arrange for hospitalization.    May 30 2023:  Post hospital follow up.  Has gained 5 lbs due to edema acquired during hospitalization.  Did not bring walker today but had one yesterday. Still having watery four to five times daily.  Experiencing LLQ abdominal pain.  Anorectic.  Using imodium twice daily.  Husband not certain how many times she is using lomotil daily.  May be using ondansetron bid.  Memory not good but may be improving slightly.  Will start Levaquin/Flagyl for possible diverticulitis.    June 05 2023:  Meningococcal vaccine injection June 06 2023:  Ultimiris   June 10 2023:    Diarrhea has resolved.  Appetite has improved.  Memory still an issue.  Accidentally took mother's Keppra and Gabapentin over the weekend which were out on the table which resulted in confusion and worsening of balance.  Yesterday while coming into the house fell backwards; no LOC, no head or neck pain.  Husband called poison control which recommended conservative management.    Reviewed mediations and struck off medications not being used.  Will start dyazide daily for edema  June 12 2023: WBC 3.3 hemoglobin 10.1 MCV 105 platelet count 70.  Reticulocyte count 3.9% haptoglobin 27 LDH 182 Alb 3.4 Cr 0.97  June 17, 2023:  Has lost 9 pounds since last visit due to diuresis.  Eating one meal per day.  Appetite a bit improved.  Memory appears to be improving.  Still having gait issues; needs to ambulate with a walker to prevent falls.  Had a fall outside her home while picking figs and did not have a walker.   Reviewed results of labs with patient and husband.  Taking PCN twice daily   WBC  3.4 hemoglobin 10.1 MCV 104 platelet count 74.  Reticulocyte count 2.6 haptoglobin 60  LDH 159 Creatinine 1.26 albumin 3.6  June 20 2023:  Ultimiris  July 01 2023:   No weight change.  Had some diarrhea but did not use anything for it.  No falls since last visit.  Appetite better.  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  Compliant with prophylactic PCN.  LE edema improved.  Anticipate 6 months of therapy  WBC 4.0 hemoglobin 10.9 MCV 103 platelet count 77.  Reticulocyte count 1.4% LDH 148.  Haptoglobin pending CMP notable for glucose 112 creatinine 0.9 albumin 3.8  July 22 2023:  Scheduled follow up for MAHA and colon cancer.  Feeling well.  Cognition continues to improve.  To see Neurologist in October.   Would encourage consideration for tapering some of the psychotropic medications.    WBC 4.1 hemoglobin 10.7 MCV 99.7 platelet count 109 Chemistry is notable for glucose 104  August 15 2023:  Ultimiris  Review of Systems  Constitutional:  Positive for appetite change and unexpected weight change. Negative for chills, fatigue and fever.  HENT:   Negative for mouth sores, nosebleeds, sore throat, tinnitus and trouble swallowing.   Eyes:  Negative for eye problems and icterus.       Vision changes:  None  Respiratory:  Negative for chest tightness, hemoptysis, shortness of breath and wheezing.        One month of nonproductive cough.  No history of seasonable allergies  Cardiovascular:  Negative for chest pain and leg swelling.  Gastrointestinal:  Positive for diarrhea and nausea. Negative for constipation, rectal pain and vomiting.  Endocrine: Negative for hot flashes.       Cold intolerance:  none Heat intolerance:  none  Genitourinary:  Negative for bladder incontinence, difficulty urinating, dysuria, frequency, hematuria and nocturia.   Musculoskeletal:  Positive for gait problem. Negative for arthralgias, back pain, myalgias, neck pain and neck stiffness.   Skin:  Negative for itching, rash and wound.  Neurological:  Positive for dizziness, extremity weakness, gait problem and headaches. Negative for numbness, seizures and speech difficulty.  Hematological:  Negative for adenopathy. Bruises/bleeds easily.  Psychiatric/Behavioral:  Positive for confusion. Negative for sleep disturbance and suicidal ideas. The patient is not nervous/anxious.     MEDICAL HISTORY: Past Medical History:  Diagnosis Date   Abnormal glucose    Achilles tendinitis of left lower extremity    Acute suppurative arthritis due to bacteria (HCC) 03/09/2011   Overview:  Last Assessment & Plan:  Methicillin sensitive staphylococcal aureus septic hip. She clearly does need I&D of the hip. I will have her continue the doxycycline for now but stop it 7 days prior to her surgery to maximize the yield on the cultures in the operating room though I be shocked if we don't find methicillin sensitive staph aureus again.. I agree with removing as much of the pros   Anal fissure 11/30/2015   Arthritis    right hip   Atypical chest pain 04/09/2016   Bilateral edema of lower extremity    BMI 39.0-39.9,adult    Cancer [facility])    SKIN CANCER   Carpal tunnel syndrome 03/09/2011   Overview:  Last Assessment & Plan:  Clinically this seems to be carpal tunnel syndrome. Giving given her history of infection though we can keep in the back of our mind the idea that she will have a cervical spine problem but I think this is unlikely at this point in time medics carpal tunnel syndrome fits clinically this was a positive Tinel's sign however in for a brace for her.   Chronic vaginitis    Chronic venous insufficiency    Depression    Dyslipidemia    Fatigue 10/13/2015   Fibromyalgia    GERD without esophagitis    History of immune thrombocytopenia 03/09/2011   History of operative procedure on hip 03/10/2012  History of TTP (thrombotic thrombocytopenic purpura)    Hypokalemia    Lichen  sclerosus et atrophicus of the vulva 11/30/2015   Major depressive disorder, recurrent, moderate (HCC)    Methicillin susceptible Staphylococcus aureus infection 03/09/2011   Overview:  Last Assessment & Plan:  This is undoubtedlythe culprit organism   Migraine    Morbid obesity with BMI of 45.0-49.9, adult (HCC) 10/13/2015   Morbidly obese (HCC)    MSSA (methicillin susceptible Staphylococcus aureus) infection    Near syncope    Osteoarthritis, chronic    Pain due to total hip replacement (HCC) 09/20/2011   Palpitations 03/04/2019   Postmenopausal    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 syndrome (HCC)    20 yrs ago-not seen anyone for 10 yrs.   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 Mild diverticulosis. Small internal hemorrhoids. Healed anal fissure   ENDOVENOUS ABLATION SAPHENOUS VEIN W/ LASER Right 04/02/2019   endovenous laser ablation right greater saphenous vein by Waverly Ferrari MD    JOINT REPLACEMENT  08/2009   right hip replacement   REVISION TOTAL HIP ARTHROPLASTY     TONSILLECTOMY  1963   TOTAL HIP REVISION  10/08/2011   Procedure: TOTAL HIP REVISION;  Surgeon: [name];  Location: [facility];  Service: Orthopedics;  Laterality: Right;  Resection of Right Total Hip/Extended Trochanteric Osteotomy/Placement of Antibiotic Total Hip Cemented by Depuy   TOTAL HIP REVISION  03/10/2012   Procedure: TOTAL HIP REVISION;  Surgeon: [name], MD;  Location: [facility];  Service: Orthopedics;  Laterality: Right;  Reimplantation/Revision of a Right Total Hip and Removal of Cemented Implant    SOCIAL HISTORY: Social History   Socioeconomic History   Marital status: Married    Spouse name: Raiford Noble   Number of children: Not on file   Years of  education: 12+7   Highest education level: Bachelor's degree (e.g., BA, AB, BS)  Occupational History   Occupation: Magazine features editor: [employer]  Tobacco Use   Smoking status: Former    Current packs/day: 0.00    Average packs/day: 1.5 packs/day for 20.0 years (30.0 ttl pk-yrs)    Types: Cigarettes    Start date: 10/04/1970    Quit date: 10/04/1990    Years since quitting: 32.8   Smokeless tobacco: Former    Quit date: 03/09/1991  Vaping Use   Vaping status: Never Used  Substance and Sexual Activity   Alcohol use: Yes    Comment: OCCASIONAL   Drug use: No   Sexual activity: Yes    Partners: Male  Other Topics Concern   Not on file  Social History Narrative   Not on file   Social Determinants of Health   Financial Resource Strain: Not on file  Food Insecurity: Low Risk  (02/04/2023)   [facility]   Hunger Vital Sign    Worried About Running Out of Food in the Last Year: Never true    Ran Out of Food in the Last Year: Never true  Transportation Needs: Not on file (02/04/2023)  Physical Activity: Not on file  Stress: Not on file  Social Connections: Not on file  Intimate Partner Violence: Not on file    FAMILY HISTORY Family  History  Problem Relation Age of Onset   Hypertension Mother    Hypertension Father    Alzheimer's disease Father    Prostate cancer Father    Hypertension Maternal Grandfather    Seizures Other    Colon cancer Neg Hx    Stomach cancer Neg Hx    Rectal cancer Neg Hx    Esophageal cancer Neg Hx     ALLERGIES:  has No Known Allergies.  MEDICATIONS:  Current Outpatient Medications  Medication Sig Dispense Refill   aluminum-magnesium hydroxide 200-200 MG/5ML suspension Take 15 mLs by mouth every 6 (six) hours as needed for indigestion.     AMBULATORY NON FORMULARY MEDICATION Medication Name: Magic Mouth Wash 3 parts Mylanta/Maalox 2 parts Benadryl 1 part Viscous Lidocaine  Swish and Swallow 5  mL every 3-4 hours as needed (Patient not taking: Reported on 05/30/2023) 240 mL 0   buPROPion (WELLBUTRIN XL) 150 MG 24 hr tablet Take 150 mg by mouth every evening.     clobetasol cream (TEMOVATE) 0.05 % Apply 1 Application topically as needed.     clotrimazole-betamethasone (LOTRISONE) cream Apply 1 Application topically 2 (two) times daily.     divalproex (DEPAKOTE) 250 MG DR tablet Take 250 mg by mouth 2 (two) times daily.     DULoxetine (CYMBALTA) 60 MG capsule Take 60 mg by mouth at bedtime.      esomeprazole (NEXIUM) 40 MG capsule Take 40 mg by mouth daily. Pt states has been on for years     loperamide (IMODIUM) 2 MG capsule Take by mouth.     Multiple Vitamin (MULTIVITAMIN WITH MINERALS) TABS tablet Take 1 tablet by mouth daily.     nortriptyline (PAMELOR) 10 MG capsule Take 10 mg by mouth.     penicillin v potassium (VEETID) 500 MG tablet Take 1 tablet (500 mg total) by mouth 2 (two) times daily. 180 tablet 3   potassium chloride SA (KLOR-CON M) 20 MEQ tablet Take 3 tablet today, then start 1 tablet twice daily 64 tablet 0   SUMAtriptan (IMITREX) 25 MG tablet Take 25 mg by mouth.     No current facility-administered medications for this visit.    PHYSICAL EXAMINATION:  ECOG PERFORMANCE STATUS: 1 - Symptomatic but completely ambulatory   There were no vitals filed for this visit.      There were no vitals filed for this visit.       Physical Exam Vitals and nursing note reviewed.  Constitutional:      General: She is not in acute distress.    Appearance: She is obese. She is not ill-appearing, toxic-appearing or diaphoretic.     Comments: Here with husband.  More robust than last visit.  Wearing makeup for the first time in months  HENT:     Head: Normocephalic and atraumatic.     Right Ear: External ear normal.     Left Ear: External ear normal.     Nose: Nose normal. No congestion or rhinorrhea.     Mouth/Throat:     Mouth: Mucous membranes are dry.      Pharynx: No oropharyngeal exudate or posterior oropharyngeal erythema.  Eyes:     General: No scleral icterus.    Extraocular Movements: Extraocular movements intact.     Conjunctiva/sclera: Conjunctivae normal.     Pupils: Pupils are equal, round, and reactive to light.  Cardiovascular:     Rate and Rhythm: Normal rate.     Heart sounds: No murmur heard.  No friction rub. No gallop.  Abdominal:     General: Bowel sounds are normal.     Palpations: Abdomen is soft.     Tenderness: There is no abdominal tenderness. There is no guarding or rebound.  Musculoskeletal:        General: No swelling, tenderness or deformity.     Cervical back: Normal range of motion and neck supple. No rigidity or tenderness.     Comments: Bilateral LE edema continues to improve.      Lymphadenopathy:     Head:     Right side of head: No submental, submandibular, tonsillar, preauricular, posterior auricular or occipital adenopathy.     Left side of head: No submental, submandibular, tonsillar, preauricular, posterior auricular or occipital adenopathy.     Cervical: No cervical adenopathy.     Right cervical: No superficial, deep or posterior cervical adenopathy.    Left cervical: No superficial, deep or posterior cervical adenopathy.     Upper Body:     Right upper body: No supraclavicular, axillary, pectoral or epitrochlear adenopathy.     Left upper body: No supraclavicular, axillary, pectoral or epitrochlear adenopathy.  Skin:    General: Skin is warm and dry.     Coloration: Skin is not jaundiced.     Comments: Ecchymoses pretibial regions bilateral  Neurological:     General: No focal deficit present.     Mental Status: She is alert and oriented to person, place, and time.     Cranial Nerves: No cranial nerve deficit.     Comments: Seated in chair.  Much more interactive and conversant.      Psychiatric:     Comments: Forgetful.  Talkative.  Insight not good      LABORATORY DATA: I have  personally reviewed the data as listed:  Scanned Document on 07/16/2023  Component Date Value Ref Range Status   EGFR 06/26/2023 64.0   Final   Abstracted by HIM  Appointment on 07/01/2023  Component Date Value Ref Range Status   LDH 07/01/2023 140  98 - 192 U/L Final   Performed at [facility], [address], [place], [place] [address]   Retic Ct Pct 07/01/2023 1.4  0.4 - 3.1 % Final   RBC. 07/01/2023 3.24 (L)  3.87 - 5.11 MIL/uL Final   Retic Count, Absolute 07/01/2023 44.4  19.0 - 186.0 K/uL Final   Immature Retic Fract 07/01/2023 3.8  2.3 - 15.9 % Final   Performed at [facility], [address] [place], [place] [address]   Haptoglobin 07/01/2023 82  37 - 355 mg/dL Final   Comment: (NOTE) Performed At: [facility] [address] [place], [place] [id number] Jolene Schimke MD [id number]    Sodium 07/01/2023 142  135 - 145 mmol/L Final   Potassium 07/01/2023 4.1  3.5 - 5.1 mmol/L Final   Chloride 07/01/2023 102  98 - 111 mmol/L Final   CO2 07/01/2023 29  22 - 32 mmol/L Final   Glucose, Bld 07/01/2023 112 (H)  70 - 99 mg/dL Final   Glucose reference range applies only to samples taken after fasting for at least 8 hours.   BUN 07/01/2023 8  8 - 23 mg/dL Final   Creatinine, Ser 07/01/2023 0.90  0.44 - 1.00 mg/dL Final   Calcium 02/72/5366 9.6  8.9 - 10.3 mg/dL Final   Total Protein 44/12/4740 6.8  6.5 - 8.1 g/dL Final   Albumin 59/56/3875 3.8  3.5 - 5.0 g/dL  Final   AST 07/01/2023 25  15 - 41 U/L Final   ALT 07/01/2023 12  0 - 44 U/L Final   Alkaline Phosphatase 07/01/2023 58  38 - 126 U/L Final   Total Bilirubin 07/01/2023 0.7  0.3 - 1.2 mg/dL Final   GFR, Estimated 07/01/2023 >60  >60 mL/min Final   Comment: (NOTE) Calculated using the CKD-EPI Creatinine Equation (2021)    Anion gap 07/01/2023 11  5 - 15 Final   Performed at [facility], [address], [place], [place] [address]   WBC 07/01/2023 4.0   4.0 - 10.5 K/uL Final   RBC 07/01/2023 3.24 (L)  3.87 - 5.11 MIL/uL Final   Hemoglobin 07/01/2023 10.9 (L)  12.0 - 15.0 g/dL Final   HCT 60/45/4098 33.4 (L)  36.0 - 46.0 % Final   MCV 07/01/2023 103.1 (H)  80.0 - 100.0 fL Final   MCH 07/01/2023 33.6  26.0 - 34.0 pg Final   MCHC 07/01/2023 32.6  30.0 - 36.0 g/dL Final   RDW 11/91/4782 13.3  11.5 - 15.5 % Final   Platelets 07/01/2023 77 (L)  150 - 400 K/uL Final   Comment: SPECIMEN CHECKED FOR CLOTS Immature Platelet Fraction may be clinically indicated, consider ordering this additional test [id number] REPEATED TO VERIFY PLATELET COUNT CONFIRMED BY SMEAR    nRBC 07/01/2023 0.0  0.0 - 0.2 % Final   Neutrophils Relative % 07/01/2023 51  % Final   Neutro Abs 07/01/2023 2.0  1.7 - 7.7 K/uL Final   Lymphocytes Relative 07/01/2023 33  % Final   Lymphs Abs 07/01/2023 1.3  0.7 - 4.0 K/uL Final   Monocytes Relative 07/01/2023 13  % Final   Monocytes Absolute 07/01/2023 0.5  0.1 - 1.0 K/uL Final   Eosinophils Relative 07/01/2023 2  % Final   Eosinophils Absolute 07/01/2023 0.1  0.0 - 0.5 K/uL Final   Basophils Relative 07/01/2023 1  % Final   Basophils Absolute 07/01/2023 0.0  0.0 - 0.1 K/uL Final   Immature Granulocytes 07/01/2023 0  % Final   Abs Immature Granulocytes 07/01/2023 0.01  0.00 - 0.07 K/uL Final   Performed at [facility], [address] [place], [place] [address]    RADIOGRAPHIC STUDIES: I have personally reviewed the radiological images as listed and agree with the findings in the report  No results found.  ASSESSMENT/PLAN  67 y.o. female is here because of  colon cancer.  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 Mass was partially circumferential (involves < 1/3rd of the lumen circumference). 10 mm polyp in proximal ascending colon which was sessile.  Pathology ulcerated mass- invasive moderately differentiated adenocarcinoma arising within a tubular adenoma with high-grade dysplasia   January 15 2023- CEA 2.9 Chromogranin A 564  January 17 2023- CT CAP  Short segment asymmetric wall thickening of the transverse colon measuring 3.3 cm in length.   Small adjacent soft tissue nodule/lymph nodes adjacent to the colonic wall thickening measuring 6 mm, suspicious  for local nodal disease. Prominent/mildly enlarged right upper quadrant lymph nodes similar to January 26, 2019 and favored reactive.  No distant metastatic disease.  Hepatomegaly with nodular hepatic contour, suggestive of cirrhosis Cholelithiasis without findings of acute cholecystitis.   Incidental right thyroid nodule   January 23 2023- Chromogranin A 294  February 04 2023:  Transverse colectomy.  Pathology showed adenocarcinoma invading visceral peritoneum, Grade 3.  All of 30 lymph nodes negative for tumor.  MSI intact Feb 25 2023- On basis of spontaneous improvement in chromogranin A will hold on Dodatate PET.     Therapeutics-  Feb 25 2023- Reviewed [facility] treatment guidelines with patient and husband.  They are agreeable to proceed with adjuvant CAPOX x 6 months.   Mar 11 2023- Cycle 1 XelOx Mar 20 2023- Tolerated cycle 1 well with only some mild cold neuropathy April 01 2023- Cycle 2 XelOx   April 22 2023: Cycle 3 XelOx.              April 30 2023- Experiencing mucositis, anorexia, nausea and diarrhea.    May 13 2023: Cycle 4 XelOX.  Dose reduced Xeloda from 2000 mg bid to 1500 mg bid due to GI symptoms.  This will be her last cycle of adjuvant therapy  May 23 2023- Stopping Xeloda due to side effects and referring for admission.  Contacted ED attending    Chemotherapy induced nausea and diarrhea             April 30 2023-  Instructed patient to do the following-- Take Ondansetron 8 mg, four times daily.  Take Imodium 4 mg every 4 hrs for diarrhea Use the magic mouthwash every 4 hours while awake.  Arranged for patient to receive IVF and antiemetics.  To follow up in 2 days to assess progress     May 02 2023- To complete this course of Xeloda on July 15th.  Should improve with the week off between then and beginning of Cycle 4.  Anticipate dose reduction in Xeloda with Cycle 4.  Sx under better control today than at last visit due to better use of supportive care May 08 2023-  Continues to have 5 to 6 bowel movements daily.  Using imodium 2 mg q 5 hrs.  Instructed patient to use imodium 4 mg po q 4 hrs PRN diarrhea Adding lomotil up to qid PRN.  Has required potassium replacement due to diarrheal losses May 21 2023- Continues to have significant chemotherapy induced diarrhea.  Not using supportive care medications to maximum potential. Provided written instructions to patient and husband again today.  Adding Levaquin 500 mg daily.  Sending stool for C diff.  Referring to infusion for IVF.  Chemistries sent to guide electrolyte replacement.  Follow up in 2 days.   Sent testing to evaluate for DPD deficiency.  Discussed plans with clinical pharmacist.   May 23 2023- Diarrhea improved with use of scheduled imodium, lomotil, ondansetron and since starting Levaquin daily.  Remains dehydrated as judged by exam (mucosa and skin dry, tachycardic)  May 30 2023- Diarrhea persists. Began empiric Levaquin/Flagyl for diverticulitis/gastroenteritis  July 22, 2023--diarrhea resolved.  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  Considering use of uridine triacetate as antidote for possible 5 FU toxicity  June 05 2023- Suspect that mental status changes are secondary to microangiopathic hemolytic anemia  August 15 20204 Ultomiris load             June 10 2023- Mental status not yet improved.  Reviewed medication list to remove non-essential agents; particularly those with CNS side effects  June 17 2023- Mental status improving.    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  Cognition continues to improve.  To see neurologist in October.  Encouraged consideration of tapering some of the psychotropic medications if possible.  Microangiopathic hemolytic anemia             May 25 2023- Haptoglobin < 30.  GI pathogen panel negative negative for Shiga toxin and bacteria known to produce Shiga Toxin              May 30 2023- Laboratory evaluation notable for Hgb 10.1 PLT 84, LDH 255.  DAT negative.  Haptoglobin < 10.  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  Arranging for treatment with Ultomiris.  Arranged for immunization against meningococcus and begin PCN prophylaxis June 05 2023- Meningococcal vaccine June 06 2023- Ultomiris load June 10 2023- GI symptoms improving.  June 09 2023- Retic count improved to 2.6.  LDH  and haptoglobin normal.  These changes along with improvement in GI Sx and mental status indicate response to treatment June 20 2023: Ultimiris  July 01, 2023-hemoglobin improved to 10.9.  Platelet count slightly improved at 77.  LDH improved at 140 albumin improved at 3.8.  All of these plus improvement in mental status and edema indicate overall response to therapy July 22 2023-Hemoglobin essentially stable at 10.7.  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  At that time TTP and HUS were distinguishable only on clinical grounds and were thought of as being almost the same disease.  The entity of atypical HUS was not known at the time.  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  No evidence of inflammatory bowel disease by colonoscopy.    Chromogranin A 564  January 23 2023- Repeat Chromogranin A improved  Feb 25 2023- Diarrhea and chromogranin A improved therefore holding on Dotatate PET/CT    Poor venous access:    Mar 11 2023- Port placement   Headaches Mar 07 2023- Improved following surgery likely related to a component of general anesthesia.  To see neurology  Possible cirrhosis  January 17 2023- Nodular liver contour noted on CT CAP    Cancer Staging  Colon cancer [facility]) Staging form: Colon and Rectum, AJCC 8th Edition - Clinical stage from 02/25/2023:  Stage IIB (cT4a, cN0, cM0) - Signed by Loni Muse, MD on 02/25/2023 Histopathologic type: Adenocarcinoma, NOS Stage prefix: Initial diagnosis Total positive nodes: 0 Total nodes examined: 30 Histologic grade (G): G3 Histologic grading system: 4 grade system    No problem-specific Assessment & Plan notes found for this encounter.    No orders of the  defined types were placed in this encounter.   30  minutes was spent in patient care.  This included time spent preparing to see the patient (e.g., review of tests), obtaining and/or reviewing separately obtained history, counseling and educating the patient/family/caregiver, ordering medications, tests, or procedures; documenting clinical information in the electronic or other health record, independently interpreting results and communicating results to the patient/family/caregiver as well as coordination of care.       All questions were answered. The patient knows to call the clinic with any problems, questions or concerns.  This note was electronically signed.    Loni Muse, MD  07/22/2023 1:05 PM

## 2023-07-22 NOTE — Progress Notes (Deleted)
[facility] Cancer Follow up Visit:  Patient Care Team: Hurshel Party, NP as PCP - General (Internal Medicine) Loni Muse, MD as Consulting Physician (Internal Medicine)  CHIEF COMPLAINTS/PURPOSE OF CONSULTATION:  Oncology History  Colon cancer [facility])  01/15/2023 Initial Diagnosis   Colon cancer (HCC)   02/25/2023 Cancer Staging   Staging form: Colon and Rectum, AJCC 8th Edition - Clinical stage from 02/25/2023: Stage IIB (cT4a, cN0, cM0) - Signed by Loni Muse, MD on 02/25/2023 Histopathologic type: Adenocarcinoma, NOS Stage prefix: Initial diagnosis Total positive nodes: 0 Total nodes examined: 30 Histologic grade (G): G3 Histologic grading system: 4 grade system   03/11/2023 - 05/13/2023 Chemotherapy   Patient is on Treatment Plan : COLORECTAL Xelox (Capeox)(130/850) q21d       HISTORY OF PRESENTING ILLNESS: [name] 67 y.o. female is here because of  colon cancer Medical history notable for septic arthritis of the hip, obesity, atypical chest pain, skin cancer, carpal tunnel syndrome, chronic vaginitis, chronic venous and sufficiency, TTP, syncope  January 10, 2023: Colonoscopy-ulcerated 2 cm nonobstructing small mass with heaped up margins found in distal transverse colon.  Mass was partially circumferential (involves less than one third of the lumen circumference) no bleeding present.  10 mm polyp in proximal ascending colon which was sessile.  Pathology-Ascending colon polyp was compatible with a sessile serrated adenoma without cytologic dysplasia Transverse colon polyp-invasive moderately differentiated adenocarcinoma arising within a tubular adenoma with high-grade dysplasia  January 15 2023: [facility] Medical Oncology Consult Patient reports that the colonoscopy was performed for surveillance; however, she was also having episodes of intermittent diarrhea x 2 to 3 months.  Stools were not bloody, nor was she passing mucus but they were soft.  In  association with these episodes she would also feel dizzy and note an odd smell, nausea.   To see Dr. Logan Bores from surgery on January 22 2023   Social:  Married.  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  Tobacco quit 24 years.  Rare glass of wine  [facility] [employer] Mother alive 77 epileptic, dementia Father died 21 Alzheimers, prostate cancer Brother alive 43 arthritis requiring joint replacement,   WBC 6.4 hemoglobin 12.5 MCV 88 platelet count 178 normal differential CMP normal CEA 2.9 chromogranin A 564  January 17 2023:  CT CAP Short segment asymmetric wall thickening of the transverse colon measuring 3.3 cm in length, compatible with reported history of  primary colonic neoplasm.  Small adjacent soft tissue nodule/lymph nodes adjacent to the colonic wall thickening measuring 6 mm, nonspecific are suspicious  for local nodal disease. Prominent/mildly enlarged right upper quadrant lymph nodes are similar dating back to January 26, 2019 and favored reactive.  No convincing evidence of distant metastatic disease within the chest, abdomen or pelvis.  Hepatomegaly with nodular hepatic contour, suggestive of cirrhosis Cholelithiasis without findings of acute cholecystitis.   Incidental right thyroid nodule measuring   January 18 2023:  Presented to [facility] ED with headaches and chest discomfort.  CT PA negative for PE.  CT head without contrast negative.    January 22 2023:  Scheduled follow up for colon cancer.  Reviewed results of labs and imaging with patient and husband. Will repeat chromgranin A and refer for PET/CT if chromogranin A still significantly elevated.    January 23, 2023: Chromogranin A 294  February 04 2023:  Transverse colectomy Pathology showed adenocarcinoma invading visceral peritoneum, Grade 3.  All of 30 lymph nodes were negative for tumor.  Surgical Stage (T4a, N0 M0)  MSI intact  Feb 25, 2023:   Reviewed results of surgical pathology with patient and husband.  Per NCCN guidelines adjuvant  options are CAPEOX, FOLFOX 6 months, Capecitabine or observation.  Discussed risks and benefits of each  Mar 07 2023:   Bifrontal headaches have returned.  These began in October 2023 but resolved after her colon surgery.  Has seen neurology and underwent MRI brain negative for CNS lesion.  EEG normal.   (Raises the question of what medications in perioperative period helped with the HA's.)  Recovering from a dog bite on dorsum of right hand.  Will receive Capecitabine today.    Mar 11 2023:  Port placement.  Cycle 1 XelOx   Mar 19 2023:  Neurology follow up.    Mar 20 2023:   Agree with Neurology assessment that neurocognitive testing should not be performed it at all, until well after chemotherapy has been completed.  Experienced some mild nausea helped by antiemetics.   Has occasional HA's.  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   No HFS.     April 01 2023:  Cycle 2 XelOx  April 17 2023:  Has lost 4 lbs.  Appetite diminished due to dysgeusia.  No metallic taste.  Edges of tongue get red and sore and painful.  Mouth feels dry despite drinking a lot of water and tea.  Not using anything for her mouth sores.   Fatigue increased.  Has cold neuropathy and cold induced pharyngospasm.      Begin Magic mouthwash for mucositis.     April 22 2023:  Cycle 3 XelOx.    April 30 2023:    Patient called office yesterday stating that she was anorectic, nauseated, having mouth sores and diarrhea.  Has lost 13 lbs.  Has difficulty remembering details which complicates history taking.  States that anorexia began after starting this round of chemotherapy.  "Food does not get past my tongue"; did not recognize this was nausea so did not begin taking antiemetics until 3 days ago.  Only taking antiemetics bid.  Mouth feels dry but not painful.  No emesis.  Began with diarrhea in the form of 5 to 6 stools per day but does not believe that she is taking imodium for it.  Dysgeusia  even to water.   Not using magic mouthwash regularly  May 02 2023:    Diarrhea improved with imodium.  Using ondansetron alternating with compazine; no emesis with improvement in nausea.  Eating limited by mucositis which she can't say is helped by magic mouthwash.  Not eating cold foods.  Has gained 2 lbs in the last two days.  To complete the course of Xeloda on May 06 2023.    WBC 4.7 hemoglobin 11.6 platelet count 61; 60 segs 23 lymphs 13 monos 4 eos CMP notable for potassium 3.4 glucose 103  May 08 2023:   Scheduled follow-up regarding colon cancer. Has lost 4 lbs since last visit owing to anorexia.   No mouth sores but has dysgeusia and mouth feels dry.  Using magic mouthwash.  Nauseated but without emesis.  Continues to have 5 to 6 bowel movements daily.  Using imodium 2 mg q 5 hrs.    Instructed patient to use imodium 4 mg po q 4 hrs PRN diarrhea Adding lomotil up to qid PRN Xeloda has not been shipped to patient.   Will dose reduce Xeloda from 2000 mg bid to  1500 mg bid.    May 13 2023:  Cycle 4 XelOX.  Received total of 1 liter IVF during that visit due to hypotension, potassium due to hypokalemia.  Patient was told to increase dose of supplemental potassium  May 21 2023:  Seen as a work in due to side effects from chemotherapy.  Has lost 11 lbs since last visit.  Has been experiencing liquid stools sometimes almost hourly.  No nausea, emesis, mouth sores. Abdominal cramping. No fevers.  Appetite decreased.   Taking imodium 2 mg bid.  Not certain if she is taking lomotil.   Received IVF, IV potassium in infusion center WBC 3.8 hemoglobin 10.4 platelet count 50 CMP notable for potassium 3.4 AST 38 albumin 3.8 magnesium 1.7 DPD 5-fluorouracil toxicity panel sent  May 22 2023:  Fell backwards on steps outside.  Struck right elbow and knee but not head.  Skin tear to right elbow.   May 23, 2023: Scheduled follow-up to monitor side effects from chemotherapy.  Anorectic but eating  some.  Has gained 1 lbs.  Today experienced flash HA and accompanied by chest pain which last 2 to 4 minutes.  Yesterday had three diarrheal stools.  Significant improvement since taking Ondansetron, Lomotil and Imodium on scheduled basis.  Spending most of time sleeping.  Lost balance twice today.  Will stop Xeloda and arrange for hospitalization.    May 30 2023:  Post hospital follow up.  Has gained 5 lbs due to edema acquired during hospitalization.  Did not bring walker today but had one yesterday. Still having watery four to five times daily.  Experiencing LLQ abdominal pain.  Anorectic.  Using imodium twice daily.  Husband not certain how many times she is using lomotil daily.  May be using ondansetron bid.  Memory not good but may be improving slightly.  Will start Levaquin/Flagyl for possible diverticulitis.    June 05 2023:  Meningococcal vaccine injection June 06 2023:  Ultimiris   June 10 2023:    Diarrhea has resolved.  Appetite has improved.  Memory still an issue.  Accidentally took mother's Keppra and Gabapentin over the weekend which were out on the table which resulted in confusion and worsening of balance.  Yesterday while coming into the house fell backwards; no LOC, no head or neck pain.  Husband called poison control which recommended conservative management.    Reviewed mediations and struck off medications not being used.  Will start dyazide daily for edema  June 12 2023: WBC 3.3 hemoglobin 10.1 MCV 105 platelet count 70.  Reticulocyte count 3.9% haptoglobin 27 LDH 182 Alb 3.4 Cr 0.97  June 17, 2023:  Has lost 9 pounds since last visit due to diuresis.  Eating one meal per day.  Appetite a bit improved.  Memory appears to be improving.  Still having gait issues; needs to ambulate with a walker to prevent falls.  Had a fall outside her home while picking figs and did not have a walker.   Reviewed results of labs with patient and husband.  Taking PCN twice daily   WBC  3.4 hemoglobin 10.1 MCV 104 platelet count 74.  Reticulocyte count 2.6 haptoglobin 60  LDH 159 Creatinine 1.26 albumin 3.6  June 20 2023:  Ultimiris  July 01 2023:  Scheduled follow up for MAHA and colon cancer.   No weight change.  Had some diarrhea but did not use anything for it.  No falls since last visit.  Appetite better.  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  Compliant with prophylactic PCN.  LE edema improved.  Anticipate 6 months of therapy  WBC 4.0 hemoglobin 10.9 MCV 103 platelet count 77.  Reticulocyte count 1.4% LDH 148.  Haptoglobin pending CMP notable for glucose 112 creatinine 0.9 albumin 3.8  Review of Systems  Constitutional:  Positive for appetite change and unexpected weight change. Negative for chills, fatigue and fever.  HENT:   Negative for mouth sores, nosebleeds, sore throat, tinnitus and trouble swallowing.   Eyes:  Negative for eye problems and icterus.       Vision changes:  None  Respiratory:  Negative for chest tightness, hemoptysis, shortness of breath and wheezing.        One month of nonproductive cough.  No history of seasonable allergies  Cardiovascular:  Negative for chest pain and leg swelling.  Gastrointestinal:  Positive for diarrhea and nausea. Negative for constipation, rectal pain and vomiting.  Endocrine: Negative for hot flashes.       Cold intolerance:  none Heat intolerance:  none  Genitourinary:  Negative for bladder incontinence, difficulty urinating, dysuria, frequency, hematuria and nocturia.   Musculoskeletal:  Positive for gait problem. Negative for arthralgias, back pain, myalgias, neck pain and neck stiffness.  Skin:  Negative for itching, rash and wound.  Neurological:  Positive for dizziness, extremity weakness, gait problem and headaches. Negative for numbness, seizures and speech difficulty.  Hematological:  Negative for adenopathy. Bruises/bleeds easily.  Psychiatric/Behavioral:  Positive for confusion.  Negative for sleep disturbance and suicidal ideas. The patient is not nervous/anxious.     MEDICAL HISTORY: Past Medical History:  Diagnosis Date   Abnormal glucose    Achilles tendinitis of left lower extremity    Acute suppurative arthritis due to bacteria (HCC) 03/09/2011   Overview:  Last Assessment & Plan:  Methicillin sensitive staphylococcal aureus septic hip. She clearly does need I&D of the hip. I will have her continue the doxycycline for now but stop it 7 days prior to her surgery to maximize the yield on the cultures in the operating room though I be shocked if we don't find methicillin sensitive staph aureus again.. I agree with removing as much of the pros   Anal fissure 11/30/2015   Arthritis    right hip   Atypical chest pain 04/09/2016   Bilateral edema of lower extremity    BMI 39.0-39.9,adult    Cancer [facility])    SKIN CANCER   Carpal tunnel syndrome 03/09/2011   Overview:  Last Assessment & Plan:  Clinically this seems to be carpal tunnel syndrome. Giving given her history of infection though we can keep in the back of our mind the idea that she will have a cervical spine problem but I think this is unlikely at this point in time medics carpal tunnel syndrome fits clinically this was a positive Tinel's sign however in for a brace for her.   Chronic vaginitis    Chronic venous insufficiency    Depression    Dyslipidemia    Fatigue 10/13/2015   Fibromyalgia    GERD without esophagitis    History of immune thrombocytopenia 03/09/2011   History of operative procedure on hip 03/10/2012   History of TTP (thrombotic thrombocytopenic purpura)    Hypokalemia    Lichen sclerosus et atrophicus of the vulva 11/30/2015   Major depressive disorder, recurrent, moderate (HCC)    Methicillin susceptible Staphylococcus aureus infection 03/09/2011   Overview:  Last Assessment & Plan:  This is undoubtedlythe culprit organism  Migraine    Morbid obesity with BMI of 45.0-49.9, adult  (HCC) 10/13/2015   Morbidly obese (HCC)    MSSA (methicillin susceptible Staphylococcus aureus) infection    Near syncope    Osteoarthritis, chronic    Pain due to total hip replacement (HCC) 09/20/2011   Palpitations 03/04/2019   Postmenopausal    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 syndrome (HCC)    20 yrs ago-not seen anyone for 10 yrs.   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 Mild diverticulosis. Small internal hemorrhoids. Healed anal fissure   ENDOVENOUS ABLATION SAPHENOUS VEIN W/ LASER Right 04/02/2019   endovenous laser ablation right greater saphenous vein by Waverly Ferrari MD    JOINT REPLACEMENT  08/2009   right hip replacement   REVISION TOTAL HIP ARTHROPLASTY     TONSILLECTOMY  1963   TOTAL HIP REVISION  10/08/2011   Procedure: TOTAL HIP REVISION;  Surgeon: [name];  Location: [facility];  Service: Orthopedics;  Laterality: Right;  Resection of Right Total Hip/Extended Trochanteric Osteotomy/Placement of Antibiotic Total Hip Cemented by Depuy   TOTAL HIP REVISION  03/10/2012   Procedure: TOTAL HIP REVISION;  Surgeon: [name], MD;  Location: [facility];  Service: Orthopedics;  Laterality: Right;  Reimplantation/Revision of a Right Total Hip and Removal of Cemented Implant    SOCIAL HISTORY: Social History   Socioeconomic History   Marital status: Married    Spouse name: Raiford Noble   Number of children: Not on file   Years of education: 12+7   Highest education level: Bachelor's degree (e.g., BA, AB, BS)  Occupational History   Occupation: Magazine features editor: [employer]  Tobacco Use   Smoking status: Former    Current packs/day: 0.00    Average packs/day: 1.5 packs/day for 20.0 years (30.0 ttl pk-yrs)    Types:  Cigarettes    Start date: 10/04/1970    Quit date: 10/04/1990    Years since quitting: 32.8   Smokeless tobacco: Former    Quit date: 03/09/1991  Vaping Use   Vaping status: Never Used  Substance and Sexual Activity   Alcohol use: Yes    Comment: OCCASIONAL   Drug use: No   Sexual activity: Yes    Partners: Male  Other Topics Concern   Not on file  Social History Narrative   Not on file   Social Determinants of Health   Financial Resource Strain: Not on file  Food Insecurity: Low Risk  (02/04/2023)   [facility] Vital Sign    Worried About Running Out of Food in the Last Year: Never true    Ran Out of Food in the Last Year: Never true  Transportation Needs: Not on file (02/04/2023)  Physical Activity: Not on file  Stress: Not on file  Social Connections: Not on file  Intimate Partner Violence: Not on file    FAMILY HISTORY Family History  Problem Relation Age of Onset   Hypertension Mother    Hypertension Father    Alzheimer's disease Father    Prostate cancer Father    Hypertension Maternal Grandfather    Seizures Other    Colon cancer Neg Hx    Stomach cancer Neg Hx  Rectal cancer Neg Hx    Esophageal cancer Neg Hx     ALLERGIES:  has No Known Allergies.  MEDICATIONS:  Current Outpatient Medications  Medication Sig Dispense Refill   aluminum-magnesium hydroxide 200-200 MG/5ML suspension Take 15 mLs by mouth every 6 (six) hours as needed for indigestion.     AMBULATORY NON FORMULARY MEDICATION Medication Name: Magic Mouth Wash 3 parts Mylanta/Maalox 2 parts Benadryl 1 part Viscous Lidocaine  Swish and Swallow 5 mL every 3-4 hours as needed (Patient not taking: Reported on 05/30/2023) 240 mL 0   buPROPion (WELLBUTRIN XL) 150 MG 24 hr tablet Take 150 mg by mouth every evening.     clobetasol cream (TEMOVATE) 0.05 % Apply 1 Application topically as needed.     clotrimazole-betamethasone (LOTRISONE) cream Apply 1  Application topically 2 (two) times daily.     divalproex (DEPAKOTE) 250 MG DR tablet Take 250 mg by mouth 2 (two) times daily.     DULoxetine (CYMBALTA) 60 MG capsule Take 60 mg by mouth at bedtime.      esomeprazole (NEXIUM) 40 MG capsule Take 40 mg by mouth daily. Pt states has been on for years     loperamide (IMODIUM) 2 MG capsule Take by mouth.     Multiple Vitamin (MULTIVITAMIN WITH MINERALS) TABS tablet Take 1 tablet by mouth daily.     nortriptyline (PAMELOR) 10 MG capsule Take 10 mg by mouth.     penicillin v potassium (VEETID) 500 MG tablet Take 1 tablet (500 mg total) by mouth 2 (two) times daily. 180 tablet 3   potassium chloride SA (KLOR-CON M) 20 MEQ tablet Take 3 tablet today, then start 1 tablet twice daily 64 tablet 0   SUMAtriptan (IMITREX) 25 MG tablet Take 25 mg by mouth.     No current facility-administered medications for this visit.    PHYSICAL EXAMINATION:  ECOG PERFORMANCE STATUS: 1 - Symptomatic but completely ambulatory   There were no vitals filed for this visit.      There were no vitals filed for this visit.       Physical Exam Vitals and nursing note reviewed.  Constitutional:      General: She is not in acute distress.    Appearance: She is obese. She is not ill-appearing, toxic-appearing or diaphoretic.     Comments: Here with husband.  More robust than last visit  HENT:     Head: Normocephalic and atraumatic.     Right Ear: External ear normal.     Left Ear: External ear normal.     Nose: Nose normal. No congestion or rhinorrhea.     Mouth/Throat:     Mouth: Mucous membranes are dry.     Pharynx: No oropharyngeal exudate or posterior oropharyngeal erythema.  Eyes:     General: No scleral icterus.    Extraocular Movements: Extraocular movements intact.     Conjunctiva/sclera: Conjunctivae normal.     Pupils: Pupils are equal, round, and reactive to light.  Cardiovascular:     Rate and Rhythm: Normal rate.     Heart sounds: No  murmur heard.    No friction rub. No gallop.  Abdominal:     General: Bowel sounds are normal.     Palpations: Abdomen is soft.     Tenderness: There is no abdominal tenderness. There is no guarding or rebound.  Musculoskeletal:        General: No swelling, tenderness or deformity.     Cervical back: Normal range of  motion and neck supple. No rigidity or tenderness.     Comments: Bilateral LE edema continues to improve.      Lymphadenopathy:     Head:     Right side of head: No submental, submandibular, tonsillar, preauricular, posterior auricular or occipital adenopathy.     Left side of head: No submental, submandibular, tonsillar, preauricular, posterior auricular or occipital adenopathy.     Cervical: No cervical adenopathy.     Right cervical: No superficial, deep or posterior cervical adenopathy.    Left cervical: No superficial, deep or posterior cervical adenopathy.     Upper Body:     Right upper body: No supraclavicular, axillary, pectoral or epitrochlear adenopathy.     Left upper body: No supraclavicular, axillary, pectoral or epitrochlear adenopathy.  Skin:    General: Skin is warm and dry.     Coloration: Skin is not jaundiced.     Comments: Ecchymoses pretibial regions bilateral  Neurological:     General: No focal deficit present.     Mental Status: She is alert and oriented to person, place, and time.     Cranial Nerves: No cranial nerve deficit.     Comments: Seated in chair.  Much more interactive and conversant.      Psychiatric:     Comments: Forgetful.  Talkative.  Insight not good      LABORATORY DATA: I have personally reviewed the data as listed:  Scanned Document on 07/16/2023  Component Date Value Ref Range Status   EGFR 06/26/2023 64.0   Final   Abstracted by HIM  Appointment on 07/01/2023  Component Date Value Ref Range Status   LDH 07/01/2023 140  98 - 192 U/L Final   Performed at [facility], [address]  [place], [place] [address]   Retic Ct Pct 07/01/2023 1.4  0.4 - 3.1 % Final   RBC. 07/01/2023 3.24 (L)  3.87 - 5.11 MIL/uL Final   Retic Count, Absolute 07/01/2023 44.4  19.0 - 186.0 K/uL Final   Immature Retic Fract 07/01/2023 3.8  2.3 - 15.9 % Final   Performed at [facility], [address], [place], [place] [address]   Haptoglobin 07/01/2023 82  37 - 355 mg/dL Final   Comment: (NOTE) Performed At: [facility] [address] [place], [place] [id number] Jolene Schimke MD IR:[id number]    Sodium 07/01/2023 142  135 - 145 mmol/L Final   Potassium 07/01/2023 4.1  3.5 - 5.1 mmol/L Final   Chloride 07/01/2023 102  98 - 111 mmol/L Final   CO2 07/01/2023 29  22 - 32 mmol/L Final   Glucose, Bld 07/01/2023 112 (H)  70 - 99 mg/dL Final   Glucose reference range applies only to samples taken after fasting for at least 8 hours.   BUN 07/01/2023 8  8 - 23 mg/dL Final   Creatinine, Ser 07/01/2023 0.90  0.44 - 1.00 mg/dL Final   Calcium 30/16/0109 9.6  8.9 - 10.3 mg/dL Final   Total Protein 32/35/5732 6.8  6.5 - 8.1 g/dL Final   Albumin 20/25/4270 3.8  3.5 - 5.0 g/dL Final   AST 62/37/6283 25  15 - 41 U/L Final   ALT 07/01/2023 12  0 - 44 U/L Final   Alkaline Phosphatase 07/01/2023 58  38 - 126 U/L Final   Total Bilirubin 07/01/2023 0.7  0.3 - 1.2 mg/dL Final   GFR, Estimated 07/01/2023 >60  >60 mL/min Final   Comment: (NOTE) Calculated using the  CKD-EPI Creatinine Equation (2021)    Anion gap 07/01/2023 11  5 - 15 Final   Performed at [facility], [address], [place], [place] [address]   WBC 07/01/2023 4.0  4.0 - 10.5 K/uL Final   RBC 07/01/2023 3.24 (L)  3.87 - 5.11 MIL/uL Final   Hemoglobin 07/01/2023 10.9 (L)  12.0 - 15.0 g/dL Final   HCT 24/40/1027 33.4 (L)  36.0 - 46.0 % Final   MCV 07/01/2023 103.1 (H)  80.0 - 100.0 fL Final   MCH 07/01/2023 33.6  26.0 - 34.0 pg Final   MCHC 07/01/2023 32.6  30.0 - 36.0 g/dL Final   RDW 25/36/6440 13.3   11.5 - 15.5 % Final   Platelets 07/01/2023 77 (L)  150 - 400 K/uL Final   Comment: SPECIMEN CHECKED FOR CLOTS Immature Platelet Fraction may be clinically indicated, consider ordering this additional test [id number] REPEATED TO VERIFY PLATELET COUNT CONFIRMED BY SMEAR    nRBC 07/01/2023 0.0  0.0 - 0.2 % Final   Neutrophils Relative % 07/01/2023 51  % Final   Neutro Abs 07/01/2023 2.0  1.7 - 7.7 K/uL Final   Lymphocytes Relative 07/01/2023 33  % Final   Lymphs Abs 07/01/2023 1.3  0.7 - 4.0 K/uL Final   Monocytes Relative 07/01/2023 13  % Final   Monocytes Absolute 07/01/2023 0.5  0.1 - 1.0 K/uL Final   Eosinophils Relative 07/01/2023 2  % Final   Eosinophils Absolute 07/01/2023 0.1  0.0 - 0.5 K/uL Final   Basophils Relative 07/01/2023 1  % Final   Basophils Absolute 07/01/2023 0.0  0.0 - 0.1 K/uL Final   Immature Granulocytes 07/01/2023 0  % Final   Abs Immature Granulocytes 07/01/2023 0.01  0.00 - 0.07 K/uL Final   Performed at [facility], [address] [place], [place] [address]    RADIOGRAPHIC STUDIES: I have personally reviewed the radiological images as listed and agree with the findings in the report  No results found.  ASSESSMENT/PLAN  67 y.o. female is here because of  colon cancer.  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 Mass was partially circumferential (involves < 1/3rd of the lumen circumference). 10 mm polyp in proximal ascending colon which was sessile.  Pathology ulcerated mass- invasive moderately differentiated adenocarcinoma arising within a tubular adenoma with  high-grade dysplasia   January 15 2023- CEA 2.9 Chromogranin A 564  January 17 2023- CT CAP  Short segment asymmetric wall thickening of the transverse colon measuring 3.3 cm in length.   Small adjacent soft tissue nodule/lymph nodes adjacent to the colonic wall thickening measuring 6 mm, suspicious  for local nodal disease. Prominent/mildly enlarged right upper quadrant lymph nodes similar to January 26, 2019 and favored reactive.  No distant metastatic disease.  Hepatomegaly with nodular hepatic contour, suggestive of cirrhosis Cholelithiasis without findings of acute cholecystitis.   Incidental right thyroid nodule   January 23 2023- Chromogranin A 294  February 04 2023:  Transverse colectomy.  Pathology showed adenocarcinoma invading visceral peritoneum, Grade 3.  All of 30 lymph nodes negative for tumor.  MSI intact Feb 25 2023- On basis of spontaneous improvement  in chromogranin A will hold on Dodatate PET.     Therapeutics-  Feb 25 2023- Reviewed [facility] treatment guidelines with patient and husband.  They are agreeable to proceed with adjuvant CAPOX x 6 months.   Mar 11 2023- Cycle 1 XelOx Mar 20 2023- Tolerated cycle 1 well with only some mild cold neuropathy April 01 2023- Cycle 2 XelOx   April 22 2023: Cycle 3 XelOx.              April 30 2023- Experiencing mucositis, anorexia, nausea and diarrhea.    May 13 2023: Cycle 4 XelOX.  Dose reduced Xeloda from 2000 mg bid to 1500 mg bid due to GI symptoms.  This will be her last cycle of adjuvant therapy  May 23 2023- Stopping Xeloda due to side effects and referring for admission.  Contacted ED attending   Chemotherapy induced nausea and diarrhea             April 30 2023-  Instructed patient to do the following-- Take Ondansetron 8 mg, four times daily.  Take Imodium 4 mg every 4 hrs for diarrhea Use the magic mouthwash every 4 hours while awake.  Arranged for patient to receive IVF and antiemetics.  To follow up in 2 days to assess progress     May 02 2023- To complete this course of Xeloda on July 15th.  Should improve with the week off between then and beginning of Cycle 4.  Anticipate dose reduction in Xeloda with Cycle 4.  Sx under better control today than at last visit due to better use of supportive care May 08 2023-  Continues to have 5 to 6 bowel movements daily.  Using imodium 2 mg q 5 hrs.  Instructed patient to use imodium 4 mg po q 4 hrs PRN diarrhea Adding lomotil up to qid PRN.  Has required potassium replacement due to diarrheal losses May 21 2023- Continues to have significant chemotherapy induced diarrhea.  Not using supportive care medications to maximum potential. Provided written instructions to patient and husband again today.  Adding Levaquin 500 mg daily.  Sending stool for C diff.  Referring to infusion for IVF.  Chemistries sent to guide electrolyte replacement.  Follow up in 2 days.   Sent testing to evaluate for DPD deficiency.  Discussed plans with clinical pharmacist.   May 23 2023- Diarrhea improved with use of scheduled imodium, lomotil, ondansetron and since starting Levaquin daily.  Remains dehydrated as judged by exam (mucosa and skin dry, tachycardic)  May 30 2023- Diarrhea persists. Began empiric Levaquin/Flagyl for diverticulitis/gastroenteritis   Mental status changes May 23 2023- Likely multifactorial- 1) psychotropic medications (welbutrin, cymbalta, nortryptiline, depakote)  2) supportive care medications (Flexeril, lomotil, imodium, ondansetron)  3) chemotherapy (Xeloda) 4) Dehydration 5) underlying liver disease  Recommended admission via ED for evaluation and management.  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  Reviewed medication list to remove  non-essential agents; particularly those with CNS side effects  June 17 2023- Mental status improving.    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  GI pathogen panel negative negative for Shiga toxin and bacteria known to produce Shiga Toxin              May 30 2023- Laboratory evaluation notable for Hgb 10.1 PLT 84, LDH 255.  DAT negative.  Haptoglobin < 10.  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  Arranging for treatment with Ultomiris.  Arranged for immunization against meningococcus and begin PCN prophylaxis June 05 2023- Meningococcal vaccine June 06 2023- Ultomiris load June 10 2023- GI symptoms improving.  June 09 2023- Retic count improved to 2.6.  LDH and haptoglobin normal.  These changes along with improvement in GI Sx and mental status indicate response to treatment June 20 2023: Ultimiris  July 01, 2023-hemoglobin improved to 10.9.  Platelet count slightly improved at 77.  LDH improved at 140 albumin improved at 3.8.  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  At that time TTP and HUS were distinguishable only on clinical grounds and were thought of as being almost the same disease.  The entity of atypical HUS was not known at  the time.  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  No evidence of inflammatory bowel disease by colonoscopy.    Chromogranin A 564  January 23 2023- Repeat Chromogranin A improved  Feb 25 2023- Diarrhea and chromogranin A improved therefore holding on Dotatate PET/CT    Poor venous access:    Mar 11 2023- Port placement   Headaches Mar 07 2023- Improved following surgery likely related to a component of general anesthesia.  To see neurology  Possible cirrhosis  January 17 2023- Nodular liver contour noted on CT CAP    Cancer Staging  Colon cancer [facility]) Staging form: Colon and Rectum, AJCC 8th Edition - Clinical stage from 02/25/2023: Stage IIB (cT4a, cN0, cM0) - Signed by Loni Muse, MD on 02/25/2023 Histopathologic type: Adenocarcinoma, NOS Stage prefix: Initial diagnosis Total positive nodes: 0 Total nodes examined: 30 Histologic grade (G): G3 Histologic grading system: 4 grade system    No problem-specific Assessment & Plan notes found for this encounter.    No orders of the defined types were placed in this encounter.   30  minutes was spent in patient care.  This included time spent preparing to see the patient (e.g., review of tests), obtaining and/or reviewing separately obtained history, counseling and educating the patient/family/caregiver, ordering medications, tests, or procedures; documenting clinical information in the electronic or other health record, independently interpreting results and communicating results to the patient/family/caregiver as  well as coordination of care.       All questions were answered. The patient knows to call the clinic with any problems, questions or concerns.  This note was electronically signed.    Loni Muse, MD  07/22/2023 10:46 AM

## 2023-07-23 ENCOUNTER — Telehealth: Payer: Self-pay | Admitting: Oncology

## 2023-07-23 LAB — HAPTOGLOBIN: Haptoglobin: 13 mg/dL — ABNORMAL LOW (ref 37–355)

## 2023-07-23 NOTE — Telephone Encounter (Signed)
Contacted pt to schedule an appt. Unable to reach via phone.    Follow-Up Information  Follow-up disposition: Return in about 3 weeks (around 08/12/2023) for physician, labs, infusion.

## 2023-07-27 ENCOUNTER — Other Ambulatory Visit: Payer: Self-pay

## 2023-07-27 ENCOUNTER — Encounter: Payer: Self-pay | Admitting: Oncology

## 2023-07-29 DIAGNOSIS — D485 Neoplasm of uncertain behavior of skin: Secondary | ICD-10-CM | POA: Diagnosis not present

## 2023-07-29 DIAGNOSIS — I872 Venous insufficiency (chronic) (peripheral): Secondary | ICD-10-CM | POA: Diagnosis not present

## 2023-08-04 ENCOUNTER — Encounter: Payer: Self-pay | Admitting: Oncology

## 2023-08-07 ENCOUNTER — Encounter: Payer: Self-pay | Admitting: Oncology

## 2023-08-12 ENCOUNTER — Inpatient Hospital Stay: Payer: Medicare PPO | Attending: Oncology | Admitting: Oncology

## 2023-08-12 ENCOUNTER — Inpatient Hospital Stay: Payer: Medicare PPO | Attending: Oncology

## 2023-08-12 VITALS — BP 114/64 | HR 81 | Temp 97.6°F | Resp 16 | Ht 65.9 in | Wt 207.6 lb

## 2023-08-12 DIAGNOSIS — M311 Thrombotic microangiopathy, unspecified: Secondary | ICD-10-CM

## 2023-08-12 DIAGNOSIS — F05 Delirium due to known physiological condition: Secondary | ICD-10-CM

## 2023-08-12 DIAGNOSIS — D5939 Other hemolytic-uremic syndrome: Secondary | ICD-10-CM

## 2023-08-12 DIAGNOSIS — T50905S Adverse effect of unspecified drugs, medicaments and biological substances, sequela: Secondary | ICD-10-CM | POA: Diagnosis not present

## 2023-08-12 DIAGNOSIS — C184 Malignant neoplasm of transverse colon: Secondary | ICD-10-CM | POA: Insufficient documentation

## 2023-08-12 DIAGNOSIS — Z23 Encounter for immunization: Secondary | ICD-10-CM | POA: Diagnosis not present

## 2023-08-12 DIAGNOSIS — Z79899 Other long term (current) drug therapy: Secondary | ICD-10-CM | POA: Diagnosis not present

## 2023-08-12 LAB — COMPREHENSIVE METABOLIC PANEL
ALT: 22 U/L (ref 0–44)
AST: 30 U/L (ref 15–41)
Albumin: 3.7 g/dL (ref 3.5–5.0)
Alkaline Phosphatase: 75 U/L (ref 38–126)
Anion gap: 9 (ref 5–15)
BUN: 14 mg/dL (ref 8–23)
CO2: 24 mmol/L (ref 22–32)
Calcium: 9.5 mg/dL (ref 8.9–10.3)
Chloride: 108 mmol/L (ref 98–111)
Creatinine, Ser: 0.66 mg/dL (ref 0.44–1.00)
GFR, Estimated: >60 mL/min (ref >60)
Glucose, Bld: 97 mg/dL (ref 70–99)
Potassium: 4.1 mmol/L (ref 3.5–5.1)
Sodium: 141 mmol/L (ref 135–145)
Total Bilirubin: 0.6 mg/dL (ref 0.3–1.2)
Total Protein: 6.6 g/dL (ref 6.5–8.1)

## 2023-08-12 LAB — CBC WITH DIFFERENTIAL/PLATELET
Abs Immature Granulocytes: 0.06 10*3/uL (ref 0.00–0.07)
Basophils Absolute: 0 10*3/uL (ref 0.0–0.1)
Basophils Relative: 0 %
Eosinophils Absolute: 0.1 10*3/uL (ref 0.0–0.5)
Eosinophils Relative: 1 %
HCT: 32 % — ABNORMAL LOW (ref 36.0–46.0)
Hemoglobin: 10.6 g/dL — ABNORMAL LOW (ref 12.0–15.0)
Immature Granulocytes: 2 %
Lymphocytes Relative: 23 %
Lymphs Abs: 0.9 10*3/uL (ref 0.7–4.0)
MCH: 32.2 pg (ref 26.0–34.0)
MCHC: 33.1 g/dL (ref 30.0–36.0)
MCV: 97.3 fL (ref 80.0–100.0)
Monocytes Absolute: 0.4 10*3/uL (ref 0.1–1.0)
Monocytes Relative: 11 %
Neutro Abs: 2.5 10*3/uL (ref 1.7–7.7)
Neutrophils Relative %: 63 %
Platelets: 101 10*3/uL — ABNORMAL LOW (ref 150–400)
RBC: 3.29 MIL/uL — ABNORMAL LOW (ref 3.87–5.11)
RDW: 12.2 % (ref 11.5–15.5)
WBC: 3.9 10*3/uL — ABNORMAL LOW (ref 4.0–10.5)
nRBC: 0 % (ref 0.0–0.2)

## 2023-08-12 LAB — RETICULOCYTES
Immature Retic Fract: 7.5 % (ref 2.3–15.9)
RBC.: 3.28 MIL/uL — ABNORMAL LOW (ref 3.87–5.11)
Retic Count, Absolute: 57.4 10*3/uL (ref 19.0–186.0)
Retic Ct Pct: 1.8 % (ref 0.4–3.1)

## 2023-08-12 LAB — FOLATE: Folate: 10.7 ng/mL (ref >5.9)

## 2023-08-12 LAB — FERRITIN: Ferritin: 38 ng/mL (ref 11–307)

## 2023-08-12 LAB — VITAMIN B12: Vitamin B-12: 494 pg/mL (ref 180–914)

## 2023-08-12 LAB — LACTATE DEHYDROGENASE: LDH: 145 U/L (ref 98–192)

## 2023-08-12 NOTE — Progress Notes (Signed)
[facility] Cancer Follow up Visit:  Patient Care Team: Hurshel Party, NP as PCP - General (Internal Medicine) Loni Muse, MD as Consulting Physician (Internal Medicine)  CHIEF COMPLAINTS/PURPOSE OF CONSULTATION:  Oncology History  Colon cancer [facility])  01/15/2023 Initial Diagnosis   Colon cancer (HCC)   02/25/2023 Cancer Staging   Staging form: Colon and Rectum, AJCC 8th Edition - Clinical stage from 02/25/2023: Stage IIB (cT4a, cN0, cM0) - Signed by Loni Muse, MD on 02/25/2023 Histopathologic type: Adenocarcinoma, NOS Stage prefix: Initial diagnosis Total positive nodes: 0 Total nodes examined: 30 Histologic grade (G): G3 Histologic grading system: 4 grade system   03/11/2023 - 05/13/2023 Chemotherapy   Patient is on Treatment Plan : COLORECTAL Xelox (Capeox)(130/850) q21d       HISTORY OF PRESENTING ILLNESS: [name] 67 y.o. female is here because of  colon cancer Medical history notable for septic arthritis of the hip, obesity, atypical chest pain, skin cancer, carpal tunnel syndrome, chronic vaginitis, chronic venous and sufficiency, TTP, syncope  January 10, 2023: Colonoscopy-ulcerated 2 cm nonobstructing small mass with heaped up margins found in distal transverse colon.  Mass was partially circumferential (involves less than one third of the lumen circumference) no bleeding present.  10 mm polyp in proximal ascending colon which was sessile.  Pathology-Ascending colon polyp was compatible with a sessile serrated adenoma without cytologic dysplasia Transverse colon polyp-invasive moderately differentiated adenocarcinoma arising within a tubular adenoma with high-grade dysplasia  January 15 2023: [facility] Medical Oncology Consult Patient reports that the colonoscopy was performed for surveillance; however, she was also having episodes of intermittent diarrhea x 2 to 3 months.  Stools were not bloody, nor was she passing mucus but they were soft.  In  association with these episodes she would also feel dizzy and note an odd smell, nausea.   To see Dr. Logan Bores from surgery on January 22 2023   Social:  Married.  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  Tobacco quit 24 years.  Rare glass of wine  [facility] Mother alive 48 epileptic, dementia Father died 38 Alzheimers, prostate cancer Brother alive 37 arthritis requiring joint replacement,   WBC 6.4 hemoglobin 12.5 MCV 88 platelet count 178 normal differential CMP normal CEA 2.9 chromogranin A 564  January 17 2023:  CT CAP Short segment asymmetric wall thickening of the transverse colon measuring 3.3 cm in length, compatible with reported history of  primary colonic neoplasm.  Small adjacent soft tissue nodule/lymph nodes adjacent to the colonic wall thickening measuring 6 mm, nonspecific are suspicious  for local nodal disease. Prominent/mildly enlarged right upper quadrant lymph nodes are similar dating back to January 26, 2019 and favored reactive.  No convincing evidence of distant metastatic disease within the chest, abdomen or pelvis.  Hepatomegaly with nodular hepatic contour, suggestive of cirrhosis Cholelithiasis without findings of acute cholecystitis.   Incidental right thyroid nodule measuring   January 18 2023:  Presented to [facility] ED with headaches and chest discomfort.  CT PA negative for PE.  CT head without contrast negative.    January 22 2023:  Scheduled follow up for colon cancer.  Reviewed results of labs and imaging with patient and husband. Will repeat chromgranin A and refer for PET/CT if chromogranin A still significantly elevated.    January 23, 2023: Chromogranin A 294  February 04 2023:  Transverse colectomy Pathology showed adenocarcinoma invading visceral peritoneum, Grade 3.  All of 30 lymph nodes were negative for tumor.  Surgical Stage (T4a, N0 M0)  MSI intact  Feb 25, 2023:   Reviewed results of surgical pathology with patient and husband.  Per NCCN guidelines adjuvant  options are CAPEOX, FOLFOX 6 months, Capecitabine or observation.  Discussed risks and benefits of each  Mar 07 2023:   Bifrontal headaches have returned.  These began in October 2023 but resolved after her colon surgery.  Has seen neurology and underwent MRI brain negative for CNS lesion.  EEG normal.   (Raises the question of what medications in perioperative period helped with the HA's.)  Recovering from a dog bite on dorsum of right hand.  Will receive Capecitabine today.    Mar 11 2023:  Port placement.  Cycle 1 XelOx   Mar 19 2023:  Neurology follow up.    Mar 20 2023:   Agree with Neurology assessment that neurocognitive testing should not be performed it at all, until well after chemotherapy has been completed.  Experienced some mild nausea helped by antiemetics.   Has occasional HA's.  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   No HFS.     April 01 2023:  Cycle 2 XelOx  April 17 2023:  Has lost 4 lbs.  Appetite diminished due to dysgeusia.  No metallic taste.  Edges of tongue get red and sore and painful.  Mouth feels dry despite drinking a lot of water and tea.  Not using anything for her mouth sores.   Fatigue increased.  Has cold neuropathy and cold induced pharyngospasm.      Begin Magic mouthwash for mucositis.     April 22 2023:  Cycle 3 XelOx.    April 30 2023:    Patient called office yesterday stating that she was anorectic, nauseated, having mouth sores and diarrhea.  Has lost 13 lbs.  Has difficulty remembering details which complicates history taking.  States that anorexia began after starting this round of chemotherapy.  "Food does not get past my tongue"; did not recognize this was nausea so did not begin taking antiemetics until 3 days ago.  Only taking antiemetics bid.  Mouth feels dry but not painful.  No emesis.  Began with diarrhea in the form of 5 to 6 stools per day but does not believe that she is taking imodium for it.  Dysgeusia  even to water.   Not using magic mouthwash regularly  May 02 2023:    Diarrhea improved with imodium.  Using ondansetron alternating with compazine; no emesis with improvement in nausea.  Eating limited by mucositis which she can't say is helped by magic mouthwash.  Not eating cold foods.  Has gained 2 lbs in the last two days.  To complete the course of Xeloda on May 06 2023.    WBC 4.7 hemoglobin 11.6 platelet count 61; 60 segs 23 lymphs 13 monos 4 eos CMP notable for potassium 3.4 glucose 103  May 08 2023:   Scheduled follow-up regarding colon cancer. Has lost 4 lbs since last visit owing to anorexia.   No mouth sores but has dysgeusia and mouth feels dry.  Using magic mouthwash.  Nauseated but without emesis.  Continues to have 5 to 6 bowel movements daily.  Using imodium 2 mg q 5 hrs.    Instructed patient to use imodium 4 mg po q 4 hrs PRN diarrhea Adding lomotil up to qid PRN Xeloda has not been shipped to patient.   Will dose reduce Xeloda from 2000 mg bid to  1500 mg bid.    May 13 2023:  Cycle 4 XelOX.  Received total of 1 liter IVF during that visit due to hypotension, potassium due to hypokalemia.  Patient was told to increase dose of supplemental potassium  May 21 2023:  Seen as a work in due to side effects from chemotherapy.  Has lost 11 lbs since last visit.  Has been experiencing liquid stools sometimes almost hourly.  No nausea, emesis, mouth sores. Abdominal cramping. No fevers.  Appetite decreased.   Taking imodium 2 mg bid.  Not certain if she is taking lomotil.   Received IVF, IV potassium in infusion center WBC 3.8 hemoglobin 10.4 platelet count 50 CMP notable for potassium 3.4 AST 38 albumin 3.8 magnesium 1.7 DPD 5-fluorouracil toxicity panel sent  May 22 2023:  Fell backwards on steps outside.  Struck right elbow and knee but not head.  Skin tear to right elbow.   May 23, 2023: Scheduled follow-up to monitor side effects from chemotherapy.  Anorectic but eating  some.  Has gained 1 lbs.  Today experienced flash HA and accompanied by chest pain which last 2 to 4 minutes.  Yesterday had three diarrheal stools.  Significant improvement since taking Ondansetron, Lomotil and Imodium on scheduled basis.  Spending most of time sleeping.  Lost balance twice today.  Will stop Xeloda and arrange for hospitalization.    May 30 2023:  Post hospital follow up.  Has gained 5 lbs due to edema acquired during hospitalization.  Did not bring walker today but had one yesterday. Still having watery four to five times daily.  Experiencing LLQ abdominal pain.  Anorectic.  Using imodium twice daily.  Husband not certain how many times she is using lomotil daily.  May be using ondansetron bid.  Memory not good but may be improving slightly.  Will start Levaquin/Flagyl for possible diverticulitis.    June 05 2023:  Meningococcal vaccine injection June 06 2023:  Ultimiris   June 10 2023:    Diarrhea has resolved.  Appetite has improved.  Memory still an issue.  Accidentally took mother's Keppra and Gabapentin over the weekend which were out on the table which resulted in confusion and worsening of balance.  Yesterday while coming into the house fell backwards; no LOC, no head or neck pain.  Husband called poison control which recommended conservative management.    Reviewed mediations and struck off medications not being used.  Will start dyazide daily for edema  June 12 2023: WBC 3.3 hemoglobin 10.1 MCV 105 platelet count 70.  Reticulocyte count 3.9% haptoglobin 27 LDH 182 Alb 3.4 Cr 0.97  June 17, 2023:  Has lost 9 pounds since last visit due to diuresis.  Eating one meal per day.  Appetite a bit improved.  Memory appears to be improving.  Still having gait issues; needs to ambulate with a walker to prevent falls.  Had a fall outside her home while picking figs and did not have a walker.   Reviewed results of labs with patient and husband.  Taking PCN twice daily   WBC  3.4 hemoglobin 10.1 MCV 104 platelet count 74.  Reticulocyte count 2.6 haptoglobin 60  LDH 159 Creatinine 1.26 albumin 3.6  June 20 2023:  Ultimiris  July 01 2023:   No weight change.  Had some diarrhea but did not use anything for it.  No falls since last visit.  Appetite better.  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  Compliant with prophylactic PCN.  LE edema improved.  Anticipate 6 months of therapy  WBC 4.0 hemoglobin 10.9 MCV 103 platelet count 77.  Reticulocyte count 1.4% LDH 148.  Haptoglobin pending CMP notable for glucose 112 creatinine 0.9 albumin 3.8  July 22 2023:  .  Feeling well.  Cognition continues to improve.  To see Neurologist in October.   Would encourage consideration for tapering some of the psychotropic medications.    WBC 4.1 hemoglobin 10.7 MCV 99.7 platelet count 109  LDH 142 Chemistry is notable for glucose 104  August 12 2023:   Scheduled follow up for MAHA and colon cancer.  Has gained 5 lbs since last visit.  Taking care of mother who has memory issues.  No abdominal pain.  To see Neurology on September 04 2023.   No changes in psychotropic medications since last visit.   Thinking clear.  No HA's since last visit.     WBC 3.9 hemoglobin 10.6 platelet count 101 LDH 145 ferritin 38 B12 494 ferritin 10.7 reticulocyte count 1.8%  August 15 2023:  Ultimiris  Review of Systems  Constitutional:  Negative for appetite change, chills, fatigue, fever and unexpected weight change.  HENT:   Negative for mouth sores, nosebleeds, sore throat, tinnitus and trouble swallowing.   Eyes:  Negative for eye problems and icterus.       Vision changes:  None  Respiratory:  Negative for chest tightness, hemoptysis, shortness of breath and wheezing.        One month of nonproductive cough.  No history of seasonable allergies  Cardiovascular:  Negative for chest pain and leg swelling.  Gastrointestinal:  Negative for constipation, diarrhea, nausea,  rectal pain and vomiting.  Endocrine: Negative for hot flashes.       Cold intolerance:  none Heat intolerance:  none  Genitourinary:  Negative for bladder incontinence, difficulty urinating, dysuria, frequency, hematuria and nocturia.   Musculoskeletal:  Negative for arthralgias, back pain, gait problem, myalgias, neck pain and neck stiffness.  Skin:  Negative for itching, rash and wound.  Neurological:  Negative for dizziness, extremity weakness, gait problem, headaches, numbness, seizures and speech difficulty.  Hematological:  Negative for adenopathy. Does not bruise/bleed easily.  Psychiatric/Behavioral:  Negative for confusion, sleep disturbance and suicidal ideas. The patient is not nervous/anxious.     MEDICAL HISTORY: Past Medical History:  Diagnosis Date   Abnormal glucose    Achilles tendinitis of left lower extremity    Acute suppurative arthritis due to bacteria (HCC) 03/09/2011   Overview:  Last Assessment & Plan:  Methicillin sensitive staphylococcal aureus septic hip. She clearly does need I&D of the hip. I will have her continue the doxycycline for now but stop it 7 days prior to her surgery to maximize the yield on the cultures in the operating room though I be shocked if we don't find methicillin sensitive staph aureus again.. I agree with removing as much of the pros   Anal fissure 11/30/2015   Arthritis    right hip   Atypical chest pain 04/09/2016   Bilateral edema of lower extremity    BMI 39.0-39.9,adult    [facility])    SKIN CANCER   Carpal tunnel syndrome 03/09/2011   Overview:  Last Assessment & Plan:  Clinically this seems to be carpal tunnel syndrome. Giving given her history of infection though we can keep in the back of our mind the idea that she will have a cervical spine problem but I think this is unlikely at this point in  time medics carpal tunnel syndrome fits clinically this was a positive Tinel's sign however in for a brace for her.   Chronic  vaginitis    Chronic venous insufficiency    Depression    Dyslipidemia    Fatigue 10/13/2015   Fibromyalgia    GERD without esophagitis    History of immune thrombocytopenia 03/09/2011   History of operative procedure on hip 03/10/2012   History of TTP (thrombotic thrombocytopenic purpura)    Hypokalemia    Lichen sclerosus et atrophicus of the vulva 11/30/2015   Major depressive disorder, recurrent, moderate (HCC)    Methicillin susceptible Staphylococcus aureus infection 03/09/2011   Overview:  Last Assessment & Plan:  This is undoubtedlythe culprit organism   Migraine    Morbid obesity with BMI of 45.0-49.9, adult (HCC) 10/13/2015   Morbidly obese (HCC)    MSSA (methicillin susceptible Staphylococcus aureus) infection    Near syncope    Osteoarthritis, chronic    Pain due to total hip replacement (HCC) 09/20/2011   Palpitations 03/04/2019   Postmenopausal    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 syndrome (HCC)    20 yrs ago-not seen anyone for 10 yrs.   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 Mild diverticulosis. Small internal hemorrhoids. Healed anal fissure   ENDOVENOUS ABLATION SAPHENOUS VEIN W/ LASER Right 04/02/2019   endovenous laser ablation right greater saphenous vein by Waverly Ferrari MD    JOINT REPLACEMENT  08/2009   right hip replacement   REVISION TOTAL HIP ARTHROPLASTY     TONSILLECTOMY  1963   TOTAL HIP REVISION  10/08/2011   Procedure: TOTAL HIP REVISION;  Surgeon: [name];  Location: [facility];  Service: Orthopedics;  Laterality: Right;  Resection of Right Total Hip/Extended Trochanteric Osteotomy/Placement of Antibiotic Total Hip Cemented by Depuy   TOTAL HIP REVISION  03/10/2012   Procedure: TOTAL HIP REVISION;   Surgeon: [name], MD;  Location: [facility];  Service: Orthopedics;  Laterality: Right;  Reimplantation/Revision of a Right Total Hip and Removal of Cemented Implant    SOCIAL HISTORY: Social History   Socioeconomic History   Marital status: Married    Spouse name: Raiford Noble   Number of children: Not on file   Years of education: 12+7   Highest education level: Bachelor's degree (e.g., BA, AB, BS)  Occupational History   Occupation: Magazine features editor: [employer]  Tobacco Use   Smoking status: Former    Current packs/day: 0.00    Average packs/day: 1.5 packs/day for 20.0 years (30.0 ttl pk-yrs)    Types: Cigarettes    Start date: 10/04/1970    Quit date: 10/04/1990    Years since quitting: 32.8   Smokeless tobacco: Former    Quit date: 03/09/1991  Vaping Use   Vaping status: Never Used  Substance and Sexual Activity   Alcohol use: Yes    Comment: OCCASIONAL   Drug use: No   Sexual activity: Yes    Partners: Male  Other Topics Concern   Not on file  Social History Narrative   Not on file   Social Determinants of Health   Financial Resource Strain: Not on file  Food Insecurity: Low Risk  (02/04/2023)   [facility] from [facility]   Hunger Vital Sign    Worried About Running Out of Food in the Last Year: Never true    Ran Out of Food in the Last Year: Never true  Transportation Needs: Not on file (02/04/2023)  Physical Activity: Not on file  Stress: Not on file  Social Connections: Not on file  Intimate Partner Violence: Not on file    FAMILY HISTORY Family History  Problem Relation Age of Onset   Hypertension Mother    Hypertension Father    Alzheimer's disease Father    Prostate cancer Father    Hypertension Maternal Grandfather    Seizures Other    Colon cancer Neg Hx    Stomach cancer Neg Hx    Rectal cancer Neg Hx    Esophageal cancer Neg Hx     ALLERGIES:  has No Known Allergies.  MEDICATIONS:  Current Outpatient  Medications  Medication Sig Dispense Refill   aluminum-magnesium hydroxide 200-200 MG/5ML suspension Take 15 mLs by mouth every 6 (six) hours as needed for indigestion.     AMBULATORY NON FORMULARY MEDICATION Medication Name: Magic Mouth Wash 3 parts Mylanta/Maalox 2 parts Benadryl 1 part Viscous Lidocaine  Swish and Swallow 5 mL every 3-4 hours as needed (Patient not taking: Reported on 05/30/2023) 240 mL 0   buPROPion (WELLBUTRIN XL) 150 MG 24 hr tablet Take 150 mg by mouth every evening.     clobetasol cream (TEMOVATE) 0.05 % Apply 1 Application topically as needed.     clotrimazole-betamethasone (LOTRISONE) cream Apply 1 Application topically 2 (two) times daily.     divalproex (DEPAKOTE) 250 MG DR tablet Take 250 mg by mouth 2 (two) times daily.     DULoxetine (CYMBALTA) 60 MG capsule Take 60 mg by mouth at bedtime.      esomeprazole (NEXIUM) 40 MG capsule Take 40 mg by mouth daily. Pt states has been on for years     loperamide (IMODIUM) 2 MG capsule Take by mouth.     Multiple Vitamin (MULTIVITAMIN WITH MINERALS) TABS tablet Take 1 tablet by mouth daily.     nortriptyline (PAMELOR) 10 MG capsule Take 10 mg by mouth.     penicillin v potassium (VEETID) 500 MG tablet Take 1 tablet (500 mg total) by mouth 2 (two) times daily. 180 tablet 3   potassium chloride SA (KLOR-CON M) 20 MEQ tablet Take 3 tablet today, then start 1 tablet twice daily 64 tablet 0   SUMAtriptan (IMITREX) 25 MG tablet Take 25 mg by mouth.     No current facility-administered medications for this visit.    PHYSICAL EXAMINATION:  ECOG PERFORMANCE STATUS: 1 - Symptomatic but completely ambulatory   There were no vitals filed for this visit.      There were no vitals filed for this visit.       Physical Exam Vitals and nursing note reviewed.  Constitutional:      General: She is not in acute distress.    Appearance: She is obese. She is not ill-appearing, toxic-appearing or diaphoretic.     Comments:  Here with husband.  More robust than last visit.  Wearing makeup for the first time in months  HENT:     Head: Normocephalic and atraumatic.     Right Ear: External ear normal.     Left Ear: External ear normal.     Nose: Nose normal. No congestion or rhinorrhea.     Mouth/Throat:     Mouth: Mucous membranes are dry.  Pharynx: No oropharyngeal exudate or posterior oropharyngeal erythema.  Eyes:     General: No scleral icterus.    Extraocular Movements: Extraocular movements intact.     Conjunctiva/sclera: Conjunctivae normal.     Pupils: Pupils are equal, round, and reactive to light.  Cardiovascular:     Rate and Rhythm: Normal rate.     Heart sounds: No murmur heard.    No friction rub. No gallop.  Abdominal:     General: Bowel sounds are normal.     Palpations: Abdomen is soft.     Tenderness: There is no abdominal tenderness. There is no guarding or rebound.  Musculoskeletal:        General: No swelling, tenderness or deformity.     Cervical back: Normal range of motion and neck supple. No rigidity or tenderness.     Comments: Bilateral LE edema continues to improve.      Lymphadenopathy:     Head:     Right side of head: No submental, submandibular, tonsillar, preauricular, posterior auricular or occipital adenopathy.     Left side of head: No submental, submandibular, tonsillar, preauricular, posterior auricular or occipital adenopathy.     Cervical: No cervical adenopathy.     Right cervical: No superficial, deep or posterior cervical adenopathy.    Left cervical: No superficial, deep or posterior cervical adenopathy.     Upper Body:     Right upper body: No supraclavicular, axillary, pectoral or epitrochlear adenopathy.     Left upper body: No supraclavicular, axillary, pectoral or epitrochlear adenopathy.  Skin:    General: Skin is warm and dry.     Coloration: Skin is not jaundiced.     Comments: Ecchymoses pretibial regions bilateral  Neurological:     General:  No focal deficit present.     Mental Status: She is alert and oriented to person, place, and time.     Cranial Nerves: No cranial nerve deficit.     Comments: Seated in chair.  Much more interactive and conversant.      Psychiatric:     Comments: Forgetful.  Talkative.  Insight not good     LABORATORY DATA: I have personally reviewed the data as listed:  Appointment on 07/22/2023  Component Date Value Ref Range Status   LDH 07/22/2023 142  98 - 192 U/L Final   Performed at [facility], [address], [place], [place] [address]   Haptoglobin 07/22/2023 13 (L)  37 - 355 mg/dL Final   Comment: (NOTE) Performed At: [facility] [address] [place], [place] [id number] Jolene Schimke MD [id number]    Sodium 07/22/2023 140  135 - 145 mmol/L Final   Potassium 07/22/2023 4.0  3.5 - 5.1 mmol/L Final   Chloride 07/22/2023 103  98 - 111 mmol/L Final   CO2 07/22/2023 27  22 - 32 mmol/L Final   Glucose, Bld 07/22/2023 104 (H)  70 - 99 mg/dL Final   Glucose reference range applies only to samples taken after fasting for at least 8 hours.   BUN 07/22/2023 14  8 - 23 mg/dL Final   Creatinine, Ser 07/22/2023 0.88  0.44 - 1.00 mg/dL Final   Calcium 30/86/5784 9.4  8.9 - 10.3 mg/dL Final   Total Protein 69/62/9528 6.8  6.5 - 8.1 g/dL Final   Albumin 41/32/4401 3.8  3.5 - 5.0 g/dL Final   AST 02/72/5366 27  15 - 41 U/L Final   ALT 07/22/2023 18  0 - 44 U/L  Final   Alkaline Phosphatase 07/22/2023 74  38 - 126 U/L Final   Total Bilirubin 07/22/2023 0.7  0.3 - 1.2 mg/dL Final   GFR, Estimated 07/22/2023 >60  >60 mL/min Final   Comment: (NOTE) Calculated using the CKD-EPI Creatinine Equation (2021)    Anion gap 07/22/2023 10  5 - 15 Final   Performed at [facility], [address], [place], [place] [address]   WBC 07/22/2023 4.1  4.0 - 10.5 K/uL Final   RBC 07/22/2023 3.31 (L)  3.87 - 5.11 MIL/uL Final   Hemoglobin 07/22/2023 10.7 (L)  12.0 -  15.0 g/dL Final   HCT 29/56/2130 33.0 (L)  36.0 - 46.0 % Final   MCV 07/22/2023 99.7  80.0 - 100.0 fL Final   MCH 07/22/2023 32.3  26.0 - 34.0 pg Final   MCHC 07/22/2023 32.4  30.0 - 36.0 g/dL Final   RDW 86/57/8469 12.2  11.5 - 15.5 % Final   Platelets 07/22/2023 109 (L)  150 - 400 K/uL Final   Comment: SPECIMEN CHECKED FOR CLOTS Immature Platelet Fraction may be clinically indicated, consider ordering this additional test [id number] CONSISTENT WITH PREVIOUS RESULT REPEATED TO VERIFY    nRBC 07/22/2023 0.0  0.0 - 0.2 % Final   Neutrophils Relative % 07/22/2023 55  % Final   Neutro Abs 07/22/2023 2.2  1.7 - 7.7 K/uL Final   Lymphocytes Relative 07/22/2023 32  % Final   Lymphs Abs 07/22/2023 1.3  0.7 - 4.0 K/uL Final   Monocytes Relative 07/22/2023 12  % Final   Monocytes Absolute 07/22/2023 0.5  0.1 - 1.0 K/uL Final   Eosinophils Relative 07/22/2023 1  % Final   Eosinophils Absolute 07/22/2023 0.0  0.0 - 0.5 K/uL Final   Basophils Relative 07/22/2023 0  % Final   Basophils Absolute 07/22/2023 0.0  0.0 - 0.1 K/uL Final   Immature Granulocytes 07/22/2023 0  % Final   Abs Immature Granulocytes 07/22/2023 0.01  0.00 - 0.07 K/uL Final   Performed at [facility], [address], [place], [place] [address]  Scanned Document on 07/16/2023  Component Date Value Ref Range Status   EGFR 06/26/2023 64.0   Final   Abstracted by HIM    RADIOGRAPHIC STUDIES: I have personally reviewed the radiological images as listed and agree with the findings in the report  No results found.  ASSESSMENT/PLAN  67 y.o. female is here because of  colon cancer.  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 Mass was partially circumferential (involves < 1/3rd of the lumen circumference). 10 mm polyp in proximal ascending colon which was sessile.  Pathology ulcerated mass- invasive moderately differentiated adenocarcinoma arising within a tubular adenoma with high-grade dysplasia   January 15 2023- CEA 2.9 Chromogranin A 564  January 17 2023- CT CAP  Short segment asymmetric wall thickening of the transverse colon measuring 3.3 cm in length.   Small adjacent soft tissue nodule/lymph nodes adjacent to the colonic wall thickening measuring 6 mm, suspicious  for local nodal disease. Prominent/mildly enlarged right upper quadrant lymph nodes similar to January 26, 2019 and favored reactive.  No distant metastatic disease.  Hepatomegaly with nodular hepatic contour, suggestive of cirrhosis Cholelithiasis without findings of acute cholecystitis.   Incidental right thyroid nodule   January 23 2023- Chromogranin A 294  February 04 2023:  Transverse colectomy.  Pathology showed adenocarcinoma invading visceral peritoneum, Grade 3.  All of 30 lymph nodes negative for tumor.  MSI intact Feb 25 2023- On basis of spontaneous improvement in chromogranin A will hold on Dodatate PET.     Therapeutics-  Feb 25 2023- Reviewed [facility] treatment guidelines with patient and husband.  They are agreeable to proceed with adjuvant CAPOX x 6 months.   Mar 11 2023- Cycle 1 XelOx Mar 20 2023- Tolerated cycle 1 well with only some mild cold neuropathy April 01 2023- Cycle 2 XelOx   April 22 2023: Cycle 3 XelOx.              April 30 2023- Experiencing mucositis, anorexia, nausea and diarrhea.    May 13 2023: Cycle 4 XelOX.  Dose reduced Xeloda from 2000 mg bid to 1500 mg bid due to GI symptoms.  This will be her last cycle of adjuvant therapy  May 23 2023- Stopping Xeloda due to side effects and referring for admission.  Contacted ED attending  August 12 2023- Will need follow up CT and colonoscopy in the future   Chemotherapy induced nausea and diarrhea             April 30 2023-  Instructed patient to do the following-- Take Ondansetron 8 mg, four times daily.  Take Imodium 4 mg every 4 hrs for diarrhea Use the magic mouthwash every 4 hours while awake.  Arranged for patient to receive IVF and antiemetics.  To follow up in 2 days to assess progress     May 02 2023- To complete this course of Xeloda on July 15th.  Should improve with the week off between then and beginning of Cycle 4.  Anticipate dose reduction in Xeloda with Cycle 4.  Sx under better control today than at last visit due to better use of supportive care May 08 2023-  Continues to have 5 to 6 bowel movements daily.  Using imodium 2 mg q 5 hrs.  Instructed patient to use imodium 4 mg po q 4 hrs PRN diarrhea Adding lomotil up to qid PRN.  Has required potassium replacement due to diarrheal losses May 21 2023- Continues to have significant chemotherapy induced diarrhea.  Not using supportive care medications to maximum potential. Provided written instructions to patient and husband again today.  Adding Levaquin 500 mg daily.  Sending stool for C diff.  Referring to infusion for IVF.  Chemistries sent to guide electrolyte replacement.  Follow up in 2 days.   Sent testing to evaluate for DPD deficiency.  Discussed plans with clinical pharmacist.   May 23 2023- Diarrhea improved with use of scheduled imodium, lomotil, ondansetron and since starting Levaquin daily.  Remains dehydrated as judged by exam (mucosa and skin dry, tachycardic)  May 30 2023- Diarrhea persists. Began empiric Levaquin/Flagyl for diverticulitis/gastroenteritis  July 22, 2023--diarrhea resolved.  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  Considering use of uridine triacetate as antidote for possible 5 FU toxicity  June 05 2023- Suspect that mental status  changes are secondary to microangiopathic hemolytic anemia              August 15 20204 Ultomiris load             June 10 2023- Mental status not yet improved.  Reviewed medication list to remove non-essential agents; particularly those with CNS side effects  June 17 2023- Mental status improving.    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  Cognition continues to improve.  To see neurologist in October.  Encouraged consideration of tapering some of the psychotropic medications if possible. August 12 2023- Continues to improve.  Will see Neurology.  Hopeful that can be tapered off of several medications.    Microangiopathic hemolytic anemia             May 25 2023- Haptoglobin < 30.  GI pathogen panel negative negative for Shiga toxin and bacteria known to produce Shiga Toxin              May 30 2023- Laboratory evaluation notable for Hgb 10.1 PLT 84, LDH 255.  DAT negative.  Haptoglobin < 10.  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  Arranging for treatment with Ultomiris.  Arranged for immunization against meningococcus and  begin PCN prophylaxis June 05 2023- Meningococcal vaccine June 06 2023- Ultomiris load June 10 2023- GI symptoms improving.  June 09 2023- Retic count improved to 2.6.  LDH and haptoglobin normal.  These changes along with improvement in GI Sx and mental status indicate response to treatment June 20 2023: Ultimiris  July 01, 2023-hemoglobin improved to 10.9.  Platelet count slightly improved at 77.  LDH improved at 140 albumin improved at 3.8.  All of these plus improvement in mental status and edema indicate overall response to therapy July 22 2023-Hemoglobin essentially stable at 10.7.  MCV platelet count continues to improve August 12 2023- Hgb 10.6, LDH and PLT stable.  Anemia in part secondary to iron deficiency for which will arrange replacement.  Thinking continues to improve.  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  At that time TTP and HUS were distinguishable only on clinical grounds and were thought of as being almost the same disease.  The entity of atypical HUS was not known at the time.  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  No evidence of inflammatory bowel disease by colonoscopy.    Chromogranin A 564  January 23 2023- Repeat Chromogranin A improved  Feb 25 2023- Diarrhea and chromogranin A improved therefore holding on Dotatate PET/CT    Poor venous access:    Mar 11 2023- Port placement   Headaches Mar 07 2023- Improved following surgery likely related to a component of general anesthesia.  To see neurology  Possible cirrhosis  January 17 2023- Nodular liver contour noted on CT CAP    Cancer Staging  Colon cancer [facility]) Staging  form: Colon and Rectum, AJCC 8th Edition - Clinical stage from 02/25/2023: Stage IIB (cT4a, cN0, cM0) - Signed by Loni Muse, MD on 02/25/2023 Histopathologic type: Adenocarcinoma, NOS Stage prefix: Initial diagnosis Total positive nodes: 0 Total nodes examined: 30 Histologic grade (G): G3 Histologic grading system: 4 grade system    No problem-specific Assessment & Plan notes found for this encounter.    No orders of the defined types were placed in this encounter.   40  minutes was spent in patient care.  This included time spent preparing to see the patient (e.g., review of tests), obtaining and/or reviewing separately obtained history, counseling and educating the patient/family/caregiver, ordering medications, tests, or procedures; documenting clinical information in the electronic or other health record, independently interpreting results and communicating results to the patient/family/caregiver as well as coordination of care.       All questions were answered. The patient knows to call the clinic with any problems, questions or concerns.  This note was electronically signed.    Loni Muse, MD  08/12/2023 8:33 AM

## 2023-08-13 DIAGNOSIS — S93401A Sprain of unspecified ligament of right ankle, initial encounter: Secondary | ICD-10-CM | POA: Diagnosis not present

## 2023-08-13 LAB — HAPTOGLOBIN: Haptoglobin: 62 mg/dL (ref 37–355)

## 2023-08-14 ENCOUNTER — Other Ambulatory Visit: Payer: Self-pay

## 2023-08-15 ENCOUNTER — Inpatient Hospital Stay: Payer: Medicare PPO

## 2023-08-15 VITALS — BP 122/75 | HR 84 | Temp 97.8°F | Resp 18 | Ht 65.9 in | Wt 204.1 lb

## 2023-08-15 DIAGNOSIS — Z79899 Other long term (current) drug therapy: Secondary | ICD-10-CM | POA: Diagnosis not present

## 2023-08-15 DIAGNOSIS — D5939 Other hemolytic-uremic syndrome: Secondary | ICD-10-CM

## 2023-08-15 DIAGNOSIS — C184 Malignant neoplasm of transverse colon: Secondary | ICD-10-CM | POA: Diagnosis not present

## 2023-08-15 DIAGNOSIS — Z23 Encounter for immunization: Secondary | ICD-10-CM | POA: Diagnosis not present

## 2023-08-15 DIAGNOSIS — M311 Thrombotic microangiopathy, unspecified: Secondary | ICD-10-CM

## 2023-08-15 MED ORDER — SODIUM CHLORIDE 0.9 % IV SOLN: Freq: Once | INTRAVENOUS | Status: AC

## 2023-08-15 MED ORDER — HEPARIN SOD (PORK) LOCK FLUSH 100 UNIT/ML IV SOLN
500.0000 [IU] | Freq: Once | INTRAVENOUS | Status: AC | PRN
  Administered 2023-08-15: 500 [IU]

## 2023-08-15 MED ORDER — SODIUM CHLORIDE 0.9 % IV SOLN
3300.0000 mg | Freq: Once | INTRAVENOUS | Status: AC
  Administered 2023-08-15: 3300 mg via INTRAVENOUS
  Filled 2023-08-15: qty 33

## 2023-08-15 MED ORDER — MENINGOCOCCAL A C Y&W-135 OLIG IM SOLN
0.5000 mL | Freq: Once | INTRAMUSCULAR | Status: AC
Start: 2023-08-15 — End: 2023-08-15
  Administered 2023-08-15: 0.5 mL via INTRAMUSCULAR
  Filled 2023-08-15: qty 0.5

## 2023-08-15 MED ORDER — SODIUM CHLORIDE 0.9% FLUSH
10.0000 mL | INTRAVENOUS | Status: DC | PRN
Start: 2023-08-15 — End: 2023-08-15
  Administered 2023-08-15: 10 mL

## 2023-08-15 MED ORDER — INFLUENZA VAC A&B SURF ANT ADJ 0.5 ML IM SUSY
0.5000 mL | PREFILLED_SYRINGE | Freq: Once | INTRAMUSCULAR | Status: AC
  Administered 2023-08-15: 0.5 mL via INTRAMUSCULAR
  Filled 2023-08-15: qty 0.5

## 2023-08-15 NOTE — Patient Instructions (Signed)
Ravulizumab Injection What is this medication? RAVULIZUMAB (rav ue LIZ ue mab) treats certain blood conditions that can cause low levels of red blood cells (anemia) and blood clots, such as atypical hemolytic uremic syndrome (aHUS) and paroxysmal nocturnal hemoglobinuria (PNH). It works by slowing down an overactive immune system, which reduces the breakdown of red blood cells. It also prevents blood cells (platelets) from forming a clot. It may also be used to treat a condition that causes muscles to easily weaken or fatigue (myasthenia gravis). It can be used to treat neuromyelitis optica spectrum disorder (NMOSD), a condition that causes inflammation in the nerves of the eyes and spinal cord. It is a monoclonal antibody. This medicine may be used for other purposes; ask your health care provider or pharmacist if you have questions. COMMON BRAND NAME(S): ULTOMIRIS What should I tell my care team before I take this medication? They need to know if you have any of these conditions: Infection An unusual or allergic reaction to ravulizumab, other medications, foods, dyes, or preservatives Pregnant or trying to get pregnant Breastfeeding How should I use this medication? This medication is injected into a vein. It is given by your care team in a hospital or clinic setting. A special MedGuide will be given to you before each treatment. Be sure to read this information carefully each time. Talk to your care team about the use of this medication in children. While it may be prescribed for children as young as 1 month for selected conditions, precautions do apply. Overdosage: If you think you have taken too much of this medicine contact a poison control center or emergency room at once. NOTE: This medicine is only for you. Do not share this medicine with others. What if I miss a dose? Keep appointments for follow-up doses. It is important not to miss your dose. Call your care team if you are unable to  keep an appointment. What may interact with this medication? Efgartigimod Immune globulin (IVIG) This list may not describe all possible interactions. Give your health care provider a list of all the medicines, herbs, non-prescription drugs, or dietary supplements you use. Also tell them if you smoke, drink alcohol, or use illegal drugs. Some items may interact with your medicine. What should I watch for while using this medication? Visit your care team for regular checks on your progress. Tell your care team if your symptoms do not start to get better or if they get worse. You may need blood work while you are taking this medication. This medication may increase your risk of getting an infection. Call your care team for advice if you get a fever, chills, sore throat, or other symptoms of a cold or flu. Do not treat yourself. Try to avoid being around people who are sick. What side effects may I notice from receiving this medication? Side effects that you should report to your care team as soon as possible: Allergic reactions--skin rash, itching, hives, swelling of the face, lips, tongue, or throat Infection--fever, chills, cough, sore throat, wounds that don't heal, pain or trouble when passing urine, general feeling of discomfort or being unwell Infusion reactions--chest pain, shortness of breath or trouble breathing, feeling faint or lightheaded Low blood pressure--dizziness, feeling faint or lightheaded, blurry vision Side effects that usually do not require medical attention (report these to your care team if they continue or are bothersome): Diarrhea Fever Headache Nausea Pain, redness, or irritation at injection site Vomiting This list may not describe all possible side  effects. Call your doctor for medical advice about side effects. You may report side effects to FDA at 1-800-FDA-1088. Where should I keep my medication? This medication is given in a hospital or clinic. It will not be  stored at home. NOTE: This sheet is a summary. It may not cover all possible information. If you have questions about this medicine, talk to your doctor, pharmacist, or health care provider.  2024 Elsevier/Gold Standard (2023-01-23 00:00:00) Influenza Vaccine Injection What is this medication? INFLUENZA VACCINE (in floo EN zuh vak SEEN) reduces the risk of the influenza (flu). It does not treat influenza. It is still possible to get influenza after receiving this vaccine, but the symptoms may be less severe or not last as long. It works by helping your immune system learn how to fight off a future infection. This medicine may be used for other purposes; ask your health care provider or pharmacist if you have questions. COMMON BRAND NAME(S): Afluria Quadrivalent, FLUAD Quadrivalent, Fluarix Quadrivalent, Flublok Quadrivalent, FLUCELVAX Quadrivalent, Flulaval Quadrivalent, Fluzone Quadrivalent What should I tell my care team before I take this medication? They need to know if you have any of these conditions: Bleeding disorder like hemophilia Fever or infection Guillain-Barre syndrome or other neurological problems Immune system problems Infection with the human immunodeficiency virus (HIV) or AIDS Low blood platelet counts Multiple sclerosis An unusual or allergic reaction to influenza virus vaccine, latex, other medications, foods, dyes, or preservatives. Different brands of vaccines contain different allergens. Some may contain latex or eggs. Talk to your care team about your allergies to make sure that you get the right vaccine. Pregnant or trying to get pregnant Breastfeeding How should I use this medication? This vaccine is injected into a muscle or under the skin. It is given by your care team. A copy of Vaccine Information Statements will be given before each vaccination. Be sure to read this sheet carefully each time. This sheet may change often. Talk to your care team to see which  vaccines are right for you. Some vaccines should not be used in all age groups. Overdosage: If you think you have taken too much of this medicine contact a poison control center or emergency room at once. NOTE: This medicine is only for you. Do not share this medicine with others. What if I miss a dose? This does not apply. What may interact with this medication? Certain medications that lower your immune system, such as etanercept, anakinra, infliximab, adalimumab Certain medications that prevent or treat blood clots, such as warfarin Chemotherapy or radiation therapy Phenytoin Steroid medications, such as prednisone or cortisone Theophylline Vaccines This list may not describe all possible interactions. Give your health care provider a list of all the medicines, herbs, non-prescription drugs, or dietary supplements you use. Also tell them if you smoke, drink alcohol, or use illegal drugs. Some items may interact with your medicine. What should I watch for while using this medication? Report any side effects that do not go away with your care team. Call your care team if any unusual symptoms occur within 6 weeks of receiving this vaccine. You may still catch the flu, but the illness is not usually as bad. You cannot get the flu from the vaccine. The vaccine will not protect against colds or other illnesses that may cause fever. The vaccine is needed every year. What side effects may I notice from receiving this medication? Side effects that you should report to your care team as soon as possible: Allergic  reactions--skin rash, itching, hives, swelling of the face, lips, tongue, or throat Side effects that usually do not require medical attention (report these to your care team if they continue or are bothersome): Chills Fatigue Headache Joint pain Loss of appetite Muscle pain Nausea Pain, redness, or irritation at injection site This list may not describe all possible side effects. Call  your doctor for medical advice about side effects. You may report side effects to FDA at 1-800-FDA-1088. Where should I keep my medication? The vaccine is only given by your care team. It will not be stored at home. NOTE: This sheet is a summary. It may not cover all possible information. If you have questions about this medicine, talk to your doctor, pharmacist, or health care provider.  2024 Elsevier/Gold Standard (2022-03-20 00:00:00) Meningococcal ACWY Vaccine Injection What is this medication? MENINGOCOCCAL ACWY VACCINE (muh nin jeh KOK kul ACWY vak SEEN), or MENINGOCOCCAL CONJUGATE VACCINE (muh nin jeh KOK kul KON juh geyt vak SEEN), reduces the risk of meningitis. It does not treat meningitis. It is still possible to get meningitis after receiving this vaccine, but the symptoms may be less severe or not last as long. It works by helping your immune system learn how to fight off a future infection. This medicine may be used for other purposes; ask your health care provider or pharmacist if you have questions. COMMON BRAND NAME(S): Menactra, MenQuadfi, Menveo What should I tell my care team before I take this medication? They need to know if you have any of these conditions: Bleeding disorder Fever or infection History of Guillain-Barre syndrome Immune system problems An unusual or allergic reaction to diphtheria toxoid, meningococcal vaccine, latex, other vaccines, other medications, foods, dyes, or preservatives Pregnant or trying to get pregnant Breastfeeding How should I use this medication? This medication is injected into a muscle. It is given by your care team. A copy of Vaccine Information Statements will be given before each vaccination. Be sure to read this information carefully each time. This sheet may change often. Talk to your care team about the use of this medication in children. While it may be prescribed for children as young as 9 months for selected conditions,  precautions do apply. Overdosage: If you think you have taken too much of this medicine contact a poison control center or emergency room at once. NOTE: This medicine is only for you. Do not share this medicine with others. What if I miss a dose? This does not apply. What may interact with this medication? Adalimumab Anakinra Certain medications for arthritis Infliximab Medications for organ transplant Medications to treat cancer Medications used during some procedures to diagnose a medical condition Other vaccines Steroid medications, such as prednisone or cortisone This list may not describe all possible interactions. Give your health care provider a list of all the medicines, herbs, non-prescription drugs, or dietary supplements you use. Also tell them if you smoke, drink alcohol, or use illegal drugs. Some items may interact with your medicine. What should I watch for while using this medication? Report any side effects to your care team right away. Call your care team if you have any unusual symptoms within 6 weeks of getting this vaccine. This vaccine may not protect from all meningitis infections. Talk to your care team if you may be pregnant. What side effects may I notice from receiving this medication? Side effects that you should report to your care team as soon as possible: Allergic reactions--skin rash, itching, hives, swelling of the  face, lips, tongue, or throat Feeling faint or lightheaded Side effects that usually do not require medical attention (report these to your care team if they continue or are bothersome): Diarrhea General discomfort and fatigue Headache Irritability Muscle pain Pain, redness, or irritation at injection site This list may not describe all possible side effects. Call your doctor for medical advice about side effects. You may report side effects to FDA at 1-800-FDA-1088. Where should I keep my medication? This vaccine is only given by your care  team. It will not be stored at home. NOTE: This sheet is a summary. It may not cover all possible information. If you have questions about this medicine, talk to your doctor, pharmacist, or health care provider.  2024 Elsevier/Gold Standard (2022-03-20 00:00:00)

## 2023-08-21 ENCOUNTER — Encounter: Payer: Self-pay | Admitting: Oncology

## 2023-08-22 ENCOUNTER — Telehealth: Payer: Self-pay | Admitting: Oncology

## 2023-08-22 NOTE — Telephone Encounter (Signed)
08/22/23 Spoke with patient and confirmed all appts.

## 2023-08-23 ENCOUNTER — Other Ambulatory Visit: Payer: Self-pay

## 2023-08-29 ENCOUNTER — Inpatient Hospital Stay: Payer: Medicare PPO | Attending: Oncology

## 2023-08-29 ENCOUNTER — Encounter: Payer: Self-pay | Admitting: Oncology

## 2023-08-29 VITALS — BP 103/60 | HR 86 | Temp 98.0°F | Resp 18 | Ht 65.9 in | Wt 210.0 lb

## 2023-08-29 DIAGNOSIS — G629 Polyneuropathy, unspecified: Secondary | ICD-10-CM | POA: Diagnosis not present

## 2023-08-29 DIAGNOSIS — Z79899 Other long term (current) drug therapy: Secondary | ICD-10-CM | POA: Insufficient documentation

## 2023-08-29 DIAGNOSIS — D509 Iron deficiency anemia, unspecified: Secondary | ICD-10-CM | POA: Insufficient documentation

## 2023-08-29 DIAGNOSIS — C184 Malignant neoplasm of transverse colon: Secondary | ICD-10-CM | POA: Diagnosis not present

## 2023-08-29 DIAGNOSIS — M3119 Other thrombotic microangiopathy: Secondary | ICD-10-CM | POA: Insufficient documentation

## 2023-08-29 DIAGNOSIS — D5939 Other hemolytic-uremic syndrome: Secondary | ICD-10-CM | POA: Diagnosis not present

## 2023-08-29 MED ORDER — IRON SUCROSE 20 MG/ML IV SOLN
200.0000 mg | Freq: Once | INTRAVENOUS | Status: AC
  Administered 2023-08-29: 200 mg via INTRAVENOUS
  Filled 2023-08-29: qty 10

## 2023-08-29 MED ORDER — HEPARIN SOD (PORK) LOCK FLUSH 100 UNIT/ML IV SOLN
500.0000 [IU] | Freq: Once | INTRAVENOUS | Status: AC | PRN
  Administered 2023-08-29: 500 [IU]

## 2023-08-29 MED ORDER — SODIUM CHLORIDE 0.9% FLUSH
10.0000 mL | Freq: Two times a day (BID) | INTRAVENOUS | Status: DC
  Administered 2023-08-29: 10 mL via INTRAVENOUS

## 2023-08-29 MED ORDER — SODIUM CHLORIDE 0.9% FLUSH: 10.0000 mL | Freq: Once | INTRAVENOUS | Status: DC | PRN

## 2023-08-29 NOTE — Patient Instructions (Signed)
Iron Sucrose Injection What is this medication? IRON SUCROSE (EYE ern SOO krose) treats low levels of iron (iron deficiency anemia) in people with kidney disease. Iron is a mineral that plays an important role in making red blood cells, which carry oxygen from your lungs to the rest of your body. This medicine may be used for other purposes; ask your health care provider or pharmacist if you have questions. COMMON BRAND NAME(S): Venofer What should I tell my care team before I take this medication? They need to know if you have any of these conditions: Anemia not caused by low iron levels Heart disease High levels of iron in the blood Kidney disease Liver disease An unusual or allergic reaction to iron, other medications, foods, dyes, or preservatives Pregnant or trying to get pregnant Breastfeeding How should I use this medication? This medication is for infusion into a vein. It is given in a hospital or clinic setting. Talk to your care team about the use of this medication in children. While this medication may be prescribed for children as young as 2 years for selected conditions, precautions do apply. Overdosage: If you think you have taken too much of this medicine contact a poison control center or emergency room at once. NOTE: This medicine is only for you. Do not share this medicine with others. What if I miss a dose? Keep appointments for follow-up doses. It is important not to miss your dose. Call your care team if you are unable to keep an appointment. What may interact with this medication? Do not take this medication with any of the following: Deferoxamine Dimercaprol Other iron products This medication may also interact with the following: Chloramphenicol Deferasirox This list may not describe all possible interactions. Give your health care provider a list of all the medicines, herbs, non-prescription drugs, or dietary supplements you use. Also tell them if you smoke,  drink alcohol, or use illegal drugs. Some items may interact with your medicine. What should I watch for while using this medication? Visit your care team regularly. Tell your care team if your symptoms do not start to get better or if they get worse. You may need blood work done while you are taking this medication. You may need to follow a special diet. Talk to your care team. Foods that contain iron include: whole grains/cereals, dried fruits, beans, or peas, leafy green vegetables, and organ meats (liver, kidney). What side effects may I notice from receiving this medication? Side effects that you should report to your care team as soon as possible: Allergic reactions--skin rash, itching, hives, swelling of the face, lips, tongue, or throat Low blood pressure--dizziness, feeling faint or lightheaded, blurry vision Shortness of breath Side effects that usually do not require medical attention (report to your care team if they continue or are bothersome): Flushing Headache Joint pain Muscle pain Nausea Pain, redness, or irritation at injection site This list may not describe all possible side effects. Call your doctor for medical advice about side effects. You may report side effects to FDA at 1-800-FDA-1088. Where should I keep my medication? This medication is given in a hospital or clinic. It will not be stored at home. NOTE: This sheet is a summary. It may not cover all possible information. If you have questions about this medicine, talk to your doctor, pharmacist, or health care provider.  2024 [employer] (2023-03-15 00:00:00)

## 2023-09-02 ENCOUNTER — Encounter: Payer: Self-pay | Admitting: Oncology

## 2023-09-02 ENCOUNTER — Inpatient Hospital Stay: Payer: Medicare PPO

## 2023-09-02 VITALS — BP 118/67 | HR 83 | Temp 97.9°F | Resp 18

## 2023-09-02 DIAGNOSIS — Z79899 Other long term (current) drug therapy: Secondary | ICD-10-CM | POA: Diagnosis not present

## 2023-09-02 DIAGNOSIS — C184 Malignant neoplasm of transverse colon: Secondary | ICD-10-CM | POA: Diagnosis not present

## 2023-09-02 DIAGNOSIS — M3119 Other thrombotic microangiopathy: Secondary | ICD-10-CM | POA: Diagnosis not present

## 2023-09-02 DIAGNOSIS — D5939 Other hemolytic-uremic syndrome: Secondary | ICD-10-CM | POA: Diagnosis not present

## 2023-09-02 DIAGNOSIS — D509 Iron deficiency anemia, unspecified: Secondary | ICD-10-CM | POA: Diagnosis not present

## 2023-09-02 DIAGNOSIS — G629 Polyneuropathy, unspecified: Secondary | ICD-10-CM | POA: Diagnosis not present

## 2023-09-02 MED ORDER — SODIUM CHLORIDE 0.9% FLUSH
10.0000 mL | Freq: Once | INTRAVENOUS | Status: AC | PRN
  Administered 2023-09-02: 10 mL

## 2023-09-02 MED ORDER — IRON SUCROSE 20 MG/ML IV SOLN
200.0000 mg | Freq: Once | INTRAVENOUS | Status: AC
  Administered 2023-09-02: 200 mg via INTRAVENOUS
  Filled 2023-09-02: qty 10

## 2023-09-02 MED ORDER — SODIUM CHLORIDE 0.9% FLUSH
10.0000 mL | Freq: Two times a day (BID) | INTRAVENOUS | Status: DC
  Administered 2023-09-02: 10 mL via INTRAVENOUS

## 2023-09-02 MED ORDER — HEPARIN SOD (PORK) LOCK FLUSH 100 UNIT/ML IV SOLN
500.0000 [IU] | Freq: Once | INTRAVENOUS | Status: AC | PRN
  Administered 2023-09-02: 500 [IU]

## 2023-09-02 NOTE — Patient Instructions (Signed)
Iron Sucrose Injection What is this medication? IRON SUCROSE (EYE ern SOO krose) treats low levels of iron (iron deficiency anemia) in people with kidney disease. Iron is a mineral that plays an important role in making red blood cells, which carry oxygen from your lungs to the rest of your body. This medicine may be used for other purposes; ask your health care provider or pharmacist if you have questions. COMMON BRAND NAME(S): Venofer What should I tell my care team before I take this medication? They need to know if you have any of these conditions: Anemia not caused by low iron levels Heart disease High levels of iron in the blood Kidney disease Liver disease An unusual or allergic reaction to iron, other medications, foods, dyes, or preservatives Pregnant or trying to get pregnant Breastfeeding How should I use this medication? This medication is for infusion into a vein. It is given in a hospital or clinic setting. Talk to your care team about the use of this medication in children. While this medication may be prescribed for children as young as 2 years for selected conditions, precautions do apply. Overdosage: If you think you have taken too much of this medicine contact a poison control center or emergency room at once. NOTE: This medicine is only for you. Do not share this medicine with others. What if I miss a dose? Keep appointments for follow-up doses. It is important not to miss your dose. Call your care team if you are unable to keep an appointment. What may interact with this medication? Do not take this medication with any of the following: Deferoxamine Dimercaprol Other iron products This medication may also interact with the following: Chloramphenicol Deferasirox This list may not describe all possible interactions. Give your health care provider a list of all the medicines, herbs, non-prescription drugs, or dietary supplements you use. Also tell them if you smoke,  drink alcohol, or use illegal drugs. Some items may interact with your medicine. What should I watch for while using this medication? Visit your care team regularly. Tell your care team if your symptoms do not start to get better or if they get worse. You may need blood work done while you are taking this medication. You may need to follow a special diet. Talk to your care team. Foods that contain iron include: whole grains/cereals, dried fruits, beans, or peas, leafy green vegetables, and organ meats (liver, kidney). What side effects may I notice from receiving this medication? Side effects that you should report to your care team as soon as possible: Allergic reactions--skin rash, itching, hives, swelling of the face, lips, tongue, or throat Low blood pressure--dizziness, feeling faint or lightheaded, blurry vision Shortness of breath Side effects that usually do not require medical attention (report to your care team if they continue or are bothersome): Flushing Headache Joint pain Muscle pain Nausea Pain, redness, or irritation at injection site This list may not describe all possible side effects. Call your doctor for medical advice about side effects. You may report side effects to FDA at 1-800-FDA-1088. Where should I keep my medication? This medication is given in a hospital or clinic. It will not be stored at home. NOTE: This sheet is a summary. It may not cover all possible information. If you have questions about this medicine, talk to your doctor, pharmacist, or health care provider.  2024 [employer] (2023-03-15 00:00:00)

## 2023-09-03 DIAGNOSIS — C44722 Squamous cell carcinoma of skin of right lower limb, including hip: Secondary | ICD-10-CM | POA: Diagnosis not present

## 2023-09-04 ENCOUNTER — Encounter: Payer: Self-pay | Admitting: Oncology

## 2023-09-04 ENCOUNTER — Inpatient Hospital Stay: Payer: Medicare PPO

## 2023-09-04 VITALS — BP 108/68 | HR 82 | Temp 97.5°F | Resp 18

## 2023-09-04 DIAGNOSIS — D5939 Other hemolytic-uremic syndrome: Secondary | ICD-10-CM | POA: Diagnosis not present

## 2023-09-04 DIAGNOSIS — M3119 Other thrombotic microangiopathy: Secondary | ICD-10-CM | POA: Diagnosis not present

## 2023-09-04 DIAGNOSIS — G43009 Migraine without aura, not intractable, without status migrainosus: Secondary | ICD-10-CM | POA: Diagnosis not present

## 2023-09-04 DIAGNOSIS — G629 Polyneuropathy, unspecified: Secondary | ICD-10-CM | POA: Diagnosis not present

## 2023-09-04 DIAGNOSIS — C184 Malignant neoplasm of transverse colon: Secondary | ICD-10-CM | POA: Diagnosis not present

## 2023-09-04 DIAGNOSIS — Z79899 Other long term (current) drug therapy: Secondary | ICD-10-CM | POA: Diagnosis not present

## 2023-09-04 DIAGNOSIS — D696 Thrombocytopenia, unspecified: Secondary | ICD-10-CM | POA: Diagnosis not present

## 2023-09-04 DIAGNOSIS — Z711 Person with feared health complaint in whom no diagnosis is made: Secondary | ICD-10-CM | POA: Diagnosis not present

## 2023-09-04 DIAGNOSIS — D509 Iron deficiency anemia, unspecified: Secondary | ICD-10-CM | POA: Diagnosis not present

## 2023-09-04 MED ORDER — SODIUM CHLORIDE 0.9% FLUSH: 10.0000 mL | Freq: Two times a day (BID) | INTRAVENOUS | Status: DC

## 2023-09-04 MED ORDER — SODIUM CHLORIDE 0.9% FLUSH: 10.0000 mL | Freq: Once | INTRAVENOUS | Status: DC | PRN

## 2023-09-04 MED ORDER — IRON SUCROSE 20 MG/ML IV SOLN
200.0000 mg | Freq: Once | INTRAVENOUS | Status: AC
  Administered 2023-09-04: 200 mg via INTRAVENOUS
  Filled 2023-09-04: qty 10

## 2023-09-04 MED ORDER — HEPARIN SOD (PORK) LOCK FLUSH 100 UNIT/ML IV SOLN: 500.0000 [IU] | Freq: Once | INTRAVENOUS | Status: DC | PRN

## 2023-09-04 NOTE — Patient Instructions (Signed)
Iron Sucrose Injection What is this medication? IRON SUCROSE (EYE ern SOO krose) treats low levels of iron (iron deficiency anemia) in people with kidney disease. Iron is a mineral that plays an important role in making red blood cells, which carry oxygen from your lungs to the rest of your body. This medicine may be used for other purposes; ask your health care provider or pharmacist if you have questions. COMMON BRAND NAME(S): Venofer What should I tell my care team before I take this medication? They need to know if you have any of these conditions: Anemia not caused by low iron levels Heart disease High levels of iron in the blood Kidney disease Liver disease An unusual or allergic reaction to iron, other medications, foods, dyes, or preservatives Pregnant or trying to get pregnant Breastfeeding How should I use this medication? This medication is for infusion into a vein. It is given in a hospital or clinic setting. Talk to your care team about the use of this medication in children. While this medication may be prescribed for children as young as 2 years for selected conditions, precautions do apply. Overdosage: If you think you have taken too much of this medicine contact a poison control center or emergency room at once. NOTE: This medicine is only for you. Do not share this medicine with others. What if I miss a dose? Keep appointments for follow-up doses. It is important not to miss your dose. Call your care team if you are unable to keep an appointment. What may interact with this medication? Do not take this medication with any of the following: Deferoxamine Dimercaprol Other iron products This medication may also interact with the following: Chloramphenicol Deferasirox This list may not describe all possible interactions. Give your health care provider a list of all the medicines, herbs, non-prescription drugs, or dietary supplements you use. Also tell them if you smoke,  drink alcohol, or use illegal drugs. Some items may interact with your medicine. What should I watch for while using this medication? Visit your care team regularly. Tell your care team if your symptoms do not start to get better or if they get worse. You may need blood work done while you are taking this medication. You may need to follow a special diet. Talk to your care team. Foods that contain iron include: whole grains/cereals, dried fruits, beans, or peas, leafy green vegetables, and organ meats (liver, kidney). What side effects may I notice from receiving this medication? Side effects that you should report to your care team as soon as possible: Allergic reactions--skin rash, itching, hives, swelling of the face, lips, tongue, or throat Low blood pressure--dizziness, feeling faint or lightheaded, blurry vision Shortness of breath Side effects that usually do not require medical attention (report to your care team if they continue or are bothersome): Flushing Headache Joint pain Muscle pain Nausea Pain, redness, or irritation at injection site This list may not describe all possible side effects. Call your doctor for medical advice about side effects. You may report side effects to FDA at 1-800-FDA-1088. Where should I keep my medication? This medication is given in a hospital or clinic. It will not be stored at home. NOTE: This sheet is a summary. It may not cover all possible information. If you have questions about this medicine, talk to your doctor, pharmacist, or health care provider.  2024 [employer] (2023-03-15 00:00:00)

## 2023-09-05 ENCOUNTER — Encounter: Payer: Self-pay | Admitting: Oncology

## 2023-09-06 ENCOUNTER — Inpatient Hospital Stay: Payer: Medicare PPO

## 2023-09-06 VITALS — BP 111/64 | HR 84 | Temp 98.0°F | Resp 18

## 2023-09-06 DIAGNOSIS — M3119 Other thrombotic microangiopathy: Secondary | ICD-10-CM | POA: Diagnosis not present

## 2023-09-06 DIAGNOSIS — D5939 Other hemolytic-uremic syndrome: Secondary | ICD-10-CM | POA: Diagnosis not present

## 2023-09-06 DIAGNOSIS — G629 Polyneuropathy, unspecified: Secondary | ICD-10-CM | POA: Diagnosis not present

## 2023-09-06 DIAGNOSIS — C184 Malignant neoplasm of transverse colon: Secondary | ICD-10-CM

## 2023-09-06 DIAGNOSIS — D509 Iron deficiency anemia, unspecified: Secondary | ICD-10-CM | POA: Diagnosis not present

## 2023-09-06 DIAGNOSIS — Z79899 Other long term (current) drug therapy: Secondary | ICD-10-CM | POA: Diagnosis not present

## 2023-09-06 MED ORDER — IRON SUCROSE 20 MG/ML IV SOLN
200.0000 mg | Freq: Once | INTRAVENOUS | Status: AC
  Administered 2023-09-06: 200 mg via INTRAVENOUS
  Filled 2023-09-06: qty 10

## 2023-09-06 MED ORDER — SODIUM CHLORIDE 0.9% FLUSH
10.0000 mL | Freq: Two times a day (BID) | INTRAVENOUS | Status: DC
  Administered 2023-09-06: 10 mL via INTRAVENOUS

## 2023-09-06 NOTE — Patient Instructions (Signed)
Iron Sucrose Injection What is this medication? IRON SUCROSE (EYE ern SOO krose) treats low levels of iron (iron deficiency anemia) in people with kidney disease. Iron is a mineral that plays an important role in making red blood cells, which carry oxygen from your lungs to the rest of your body. This medicine may be used for other purposes; ask your health care provider or pharmacist if you have questions. COMMON BRAND NAME(S): Venofer What should I tell my care team before I take this medication? They need to know if you have any of these conditions: Anemia not caused by low iron levels Heart disease High levels of iron in the blood Kidney disease Liver disease An unusual or allergic reaction to iron, other medications, foods, dyes, or preservatives Pregnant or trying to get pregnant Breastfeeding How should I use this medication? This medication is for infusion into a vein. It is given in a hospital or clinic setting. Talk to your care team about the use of this medication in children. While this medication may be prescribed for children as young as 2 years for selected conditions, precautions do apply. Overdosage: If you think you have taken too much of this medicine contact a poison control center or emergency room at once. NOTE: This medicine is only for you. Do not share this medicine with others. What if I miss a dose? Keep appointments for follow-up doses. It is important not to miss your dose. Call your care team if you are unable to keep an appointment. What may interact with this medication? Do not take this medication with any of the following: Deferoxamine Dimercaprol Other iron products This medication may also interact with the following: Chloramphenicol Deferasirox This list may not describe all possible interactions. Give your health care provider a list of all the medicines, herbs, non-prescription drugs, or dietary supplements you use. Also tell them if you smoke,  drink alcohol, or use illegal drugs. Some items may interact with your medicine. What should I watch for while using this medication? Visit your care team regularly. Tell your care team if your symptoms do not start to get better or if they get worse. You may need blood work done while you are taking this medication. You may need to follow a special diet. Talk to your care team. Foods that contain iron include: whole grains/cereals, dried fruits, beans, or peas, leafy green vegetables, and organ meats (liver, kidney). What side effects may I notice from receiving this medication? Side effects that you should report to your care team as soon as possible: Allergic reactions--skin rash, itching, hives, swelling of the face, lips, tongue, or throat Low blood pressure--dizziness, feeling faint or lightheaded, blurry vision Shortness of breath Side effects that usually do not require medical attention (report to your care team if they continue or are bothersome): Flushing Headache Joint pain Muscle pain Nausea Pain, redness, or irritation at injection site This list may not describe all possible side effects. Call your doctor for medical advice about side effects. You may report side effects to FDA at 1-800-FDA-1088. Where should I keep my medication? This medication is given in a hospital or clinic. It will not be stored at home. NOTE: This sheet is a summary. It may not cover all possible information. If you have questions about this medicine, talk to your doctor, pharmacist, or health care provider.  2024 [employer] (2023-03-15 00:00:00)

## 2023-09-08 ENCOUNTER — Other Ambulatory Visit: Payer: Self-pay

## 2023-09-09 ENCOUNTER — Inpatient Hospital Stay: Payer: Medicare PPO | Admitting: Oncology

## 2023-09-09 ENCOUNTER — Inpatient Hospital Stay: Payer: Medicare PPO

## 2023-09-09 VITALS — BP 119/76 | HR 86 | Temp 97.6°F | Resp 16 | Ht 65.9 in | Wt 212.4 lb

## 2023-09-09 VITALS — BP 112/61 | HR 74 | Resp 18

## 2023-09-09 DIAGNOSIS — D5939 Other hemolytic-uremic syndrome: Secondary | ICD-10-CM

## 2023-09-09 DIAGNOSIS — G629 Polyneuropathy, unspecified: Secondary | ICD-10-CM | POA: Diagnosis not present

## 2023-09-09 DIAGNOSIS — K7460 Unspecified cirrhosis of liver: Secondary | ICD-10-CM

## 2023-09-09 DIAGNOSIS — Z09 Encounter for follow-up examination after completed treatment for conditions other than malignant neoplasm: Secondary | ICD-10-CM

## 2023-09-09 DIAGNOSIS — Z8659 Personal history of other mental and behavioral disorders: Secondary | ICD-10-CM | POA: Insufficient documentation

## 2023-09-09 DIAGNOSIS — M3119 Other thrombotic microangiopathy: Secondary | ICD-10-CM | POA: Diagnosis not present

## 2023-09-09 DIAGNOSIS — C184 Malignant neoplasm of transverse colon: Secondary | ICD-10-CM | POA: Diagnosis not present

## 2023-09-09 DIAGNOSIS — D594 Other nonautoimmune hemolytic anemias: Secondary | ICD-10-CM

## 2023-09-09 DIAGNOSIS — D509 Iron deficiency anemia, unspecified: Secondary | ICD-10-CM | POA: Diagnosis not present

## 2023-09-09 DIAGNOSIS — T50905S Adverse effect of unspecified drugs, medicaments and biological substances, sequela: Secondary | ICD-10-CM | POA: Diagnosis not present

## 2023-09-09 DIAGNOSIS — M311 Thrombotic microangiopathy, unspecified: Secondary | ICD-10-CM

## 2023-09-09 DIAGNOSIS — Z79899 Other long term (current) drug therapy: Secondary | ICD-10-CM | POA: Diagnosis not present

## 2023-09-09 LAB — CBC WITH DIFFERENTIAL/PLATELET
Abs Immature Granulocytes: 0.01 10*3/uL (ref 0.00–0.07)
Basophils Absolute: 0 10*3/uL (ref 0.0–0.1)
Basophils Relative: 1 %
Eosinophils Absolute: 0.1 10*3/uL (ref 0.0–0.5)
Eosinophils Relative: 1 %
HCT: 32.3 % — ABNORMAL LOW (ref 36.0–46.0)
Hemoglobin: 11.4 g/dL — ABNORMAL LOW (ref 12.0–15.0)
Immature Granulocytes: 0 %
Lymphocytes Relative: 21 %
Lymphs Abs: 0.9 10*3/uL (ref 0.7–4.0)
MCH: 32.6 pg (ref 26.0–34.0)
MCHC: 35.3 g/dL (ref 30.0–36.0)
MCV: 92.3 fL (ref 80.0–100.0)
Monocytes Absolute: 0.5 10*3/uL (ref 0.1–1.0)
Monocytes Relative: 11 %
Neutro Abs: 2.9 10*3/uL (ref 1.7–7.7)
Neutrophils Relative %: 66 %
Platelets: 104 10*3/uL — ABNORMAL LOW (ref 150–400)
RBC: 3.5 MIL/uL — ABNORMAL LOW (ref 3.87–5.11)
RDW: 12.4 % (ref 11.5–15.5)
WBC: 4.4 10*3/uL (ref 4.0–10.5)
nRBC: 0 % (ref 0.0–0.2)
nRBC: 0 /100{WBCs}

## 2023-09-09 LAB — COMPREHENSIVE METABOLIC PANEL
ALT: 15 U/L (ref 0–44)
AST: 23 U/L (ref 15–41)
Albumin: 4.2 g/dL (ref 3.5–5.0)
Alkaline Phosphatase: 101 U/L (ref 38–126)
Anion gap: 10 (ref 5–15)
BUN: 13 mg/dL (ref 8–23)
CO2: 24 mmol/L (ref 22–32)
Calcium: 9.3 mg/dL (ref 8.9–10.3)
Chloride: 106 mmol/L (ref 98–111)
Creatinine, Ser: 0.7 mg/dL (ref 0.44–1.00)
GFR, Estimated: >60 mL/min (ref >60)
Glucose, Bld: 100 mg/dL — ABNORMAL HIGH (ref 70–99)
Potassium: 4.4 mmol/L (ref 3.5–5.1)
Sodium: 140 mmol/L (ref 135–145)
Total Bilirubin: 0.3 mg/dL (ref <1.2)
Total Protein: 6.3 g/dL — ABNORMAL LOW (ref 6.5–8.1)

## 2023-09-09 LAB — LACTATE DEHYDROGENASE: LDH: 156 U/L (ref 98–192)

## 2023-09-09 LAB — FERRITIN: Ferritin: 238 ng/mL (ref 11–307)

## 2023-09-09 LAB — RETICULOCYTES
Immature Retic Fract: 5.8 % (ref 2.3–15.9)
RBC.: 3.54 MIL/uL — ABNORMAL LOW (ref 3.87–5.11)
Retic Count, Absolute: 80.4 10*3/uL (ref 19.0–186.0)
Retic Ct Pct: 2.3 % (ref 0.4–3.1)

## 2023-09-09 MED ORDER — SODIUM CHLORIDE 0.9% FLUSH
10.0000 mL | Freq: Two times a day (BID) | INTRAVENOUS | Status: DC
  Administered 2023-09-09: 10 mL via INTRAVENOUS

## 2023-09-09 MED ORDER — IRON SUCROSE 20 MG/ML IV SOLN
200.0000 mg | Freq: Once | INTRAVENOUS | Status: AC
Start: 2023-09-09 — End: 2023-09-09
  Administered 2023-09-09: 200 mg via INTRAVENOUS
  Filled 2023-09-09: qty 10

## 2023-09-09 MED ORDER — PENICILLIN V POTASSIUM 500 MG PO TABS: 500.0000 mg | ORAL_TABLET | Freq: Two times a day (BID) | ORAL | 3 refills | Status: DC

## 2023-09-09 MED ORDER — SODIUM CHLORIDE 0.9% FLUSH
10.0000 mL | Freq: Once | INTRAVENOUS | Status: AC | PRN
  Administered 2023-09-09: 10 mL

## 2023-09-09 MED ORDER — HEPARIN SOD (PORK) LOCK FLUSH 100 UNIT/ML IV SOLN
500.0000 [IU] | Freq: Once | INTRAVENOUS | Status: AC | PRN
  Administered 2023-09-09: 500 [IU]

## 2023-09-09 NOTE — Patient Instructions (Signed)
Iron Sucrose Injection What is this medication? IRON SUCROSE (EYE ern SOO krose) treats low levels of iron (iron deficiency anemia) in people with kidney disease. Iron is a mineral that plays an important role in making red blood cells, which carry oxygen from your lungs to the rest of your body. This medicine may be used for other purposes; ask your health care provider or pharmacist if you have questions. COMMON BRAND NAME(S): Venofer What should I tell my care team before I take this medication? They need to know if you have any of these conditions: Anemia not caused by low iron levels Heart disease High levels of iron in the blood Kidney disease Liver disease An unusual or allergic reaction to iron, other medications, foods, dyes, or preservatives Pregnant or trying to get pregnant Breastfeeding How should I use this medication? This medication is for infusion into a vein. It is given in a hospital or clinic setting. Talk to your care team about the use of this medication in children. While this medication may be prescribed for children as young as 2 years for selected conditions, precautions do apply. Overdosage: If you think you have taken too much of this medicine contact a poison control center or emergency room at once. NOTE: This medicine is only for you. Do not share this medicine with others. What if I miss a dose? Keep appointments for follow-up doses. It is important not to miss your dose. Call your care team if you are unable to keep an appointment. What may interact with this medication? Do not take this medication with any of the following: Deferoxamine Dimercaprol Other iron products This medication may also interact with the following: Chloramphenicol Deferasirox This list may not describe all possible interactions. Give your health care provider a list of all the medicines, herbs, non-prescription drugs, or dietary supplements you use. Also tell them if you smoke,  drink alcohol, or use illegal drugs. Some items may interact with your medicine. What should I watch for while using this medication? Visit your care team regularly. Tell your care team if your symptoms do not start to get better or if they get worse. You may need blood work done while you are taking this medication. You may need to follow a special diet. Talk to your care team. Foods that contain iron include: whole grains/cereals, dried fruits, beans, or peas, leafy green vegetables, and organ meats (liver, kidney). What side effects may I notice from receiving this medication? Side effects that you should report to your care team as soon as possible: Allergic reactions--skin rash, itching, hives, swelling of the face, lips, tongue, or throat Low blood pressure--dizziness, feeling faint or lightheaded, blurry vision Shortness of breath Side effects that usually do not require medical attention (report to your care team if they continue or are bothersome): Flushing Headache Joint pain Muscle pain Nausea Pain, redness, or irritation at injection site This list may not describe all possible side effects. Call your doctor for medical advice about side effects. You may report side effects to FDA at 1-800-FDA-1088. Where should I keep my medication? This medication is given in a hospital or clinic. It will not be stored at home. NOTE: This sheet is a summary. It may not cover all possible information. If you have questions about this medicine, talk to your doctor, pharmacist, or health care provider.  2024 [employer] (2023-03-15 00:00:00)

## 2023-09-09 NOTE — Progress Notes (Signed)
[facility] Cancer Follow up Visit:  Patient Care Team: Hurshel Party, NP as PCP - General (Internal Medicine) Loni Muse, MD as Consulting Physician (Internal Medicine)  CHIEF COMPLAINTS/PURPOSE OF CONSULTATION:  Oncology History  Colon cancer [facility])  01/15/2023 Initial Diagnosis   Colon cancer (HCC)   02/25/2023 Cancer Staging   Staging form: Colon and Rectum, AJCC 8th Edition - Clinical stage from 02/25/2023: Stage IIB (cT4a, cN0, cM0) - Signed by Loni Muse, MD on 02/25/2023 Histopathologic type: Adenocarcinoma, NOS Stage prefix: Initial diagnosis Total positive nodes: 0 Total nodes examined: 30 Histologic grade (G): G3 Histologic grading system: 4 grade system   03/11/2023 - 05/13/2023 Chemotherapy   Patient is on Treatment Plan : COLORECTAL Xelox (Capeox)(130/850) q21d       HISTORY OF PRESENTING ILLNESS: [name] 67 y.o. female is here because of  colon cancer Medical history notable for septic arthritis of the hip, obesity, atypical chest pain, skin cancer, carpal tunnel syndrome, chronic vaginitis, chronic venous and sufficiency, TTP, syncope  January 10, 2023: Colonoscopy-ulcerated 2 cm nonobstructing small mass with heaped up margins found in distal transverse colon.  Mass was partially circumferential (involves less than one third of the lumen circumference) no bleeding present.  10 mm polyp in proximal ascending colon which was sessile.  Pathology-Ascending colon polyp was compatible with a sessile serrated adenoma without cytologic dysplasia Transverse colon polyp-invasive moderately differentiated adenocarcinoma arising within a tubular adenoma with high-grade dysplasia  January 15 2023: [facility] Medical Oncology Consult Patient reports that the colonoscopy was performed for surveillance; however, she was also having episodes of intermittent diarrhea x 2 to 3 months.  Stools were not bloody, nor was she passing mucus but they were soft.  In  association with these episodes she would also feel dizzy and note an odd smell, nausea.   To see Dr. Logan Bores from surgery on January 22 2023   Social:  Married.  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  Tobacco quit 24 years.  Rare glass of wine  [facility] Mother alive 71 epileptic, dementia Father died 26 Alzheimers, prostate cancer Brother alive 50 arthritis requiring joint replacement,   WBC 6.4 hemoglobin 12.5 MCV 88 platelet count 178 normal differential CMP normal CEA 2.9 chromogranin A 564  January 17 2023:  CT CAP Short segment asymmetric wall thickening of the transverse colon measuring 3.3 cm in length, compatible with reported history of  primary colonic neoplasm.  Small adjacent soft tissue nodule/lymph nodes adjacent to the colonic wall thickening measuring 6 mm, nonspecific are suspicious  for local nodal disease. Prominent/mildly enlarged right upper quadrant lymph nodes are similar dating back to January 26, 2019 and favored reactive.  No convincing evidence of distant metastatic disease within the chest, abdomen or pelvis.  Hepatomegaly with nodular hepatic contour, suggestive of cirrhosis Cholelithiasis without findings of acute cholecystitis.   Incidental right thyroid nodule measuring   January 18 2023:  Presented to [facility] ED with headaches and chest discomfort.  CT PA negative for PE.  CT head without contrast negative.    January 22 2023:  Scheduled follow up for colon cancer.  Reviewed results of labs and imaging with patient and husband. Will repeat chromgranin A and refer for PET/CT if chromogranin A still significantly elevated.    January 23, 2023: Chromogranin A 294  February 04 2023:  Transverse colectomy Pathology showed adenocarcinoma invading visceral peritoneum, Grade 3.  All of 30 lymph nodes were negative for tumor.  Surgical Stage (T4a, N0 M0)  MSI intact  Feb 25, 2023:   Reviewed results of surgical pathology with patient and husband.  Per NCCN guidelines adjuvant  options are CAPEOX, FOLFOX 6 months, Capecitabine or observation.  Discussed risks and benefits of each  Mar 07 2023:   Bifrontal headaches have returned.  These began in October 2023 but resolved after her colon surgery.  Has seen neurology and underwent MRI brain negative for CNS lesion.  EEG normal.   (Raises the question of what medications in perioperative period helped with the HA's.)  Recovering from a dog bite on dorsum of right hand.  Will receive Capecitabine today.    Mar 11 2023:  Port placement.  Cycle 1 XelOx   Mar 19 2023:  Neurology follow up.    Mar 20 2023:   Agree with Neurology assessment that neurocognitive testing should not be performed it at all, until well after chemotherapy has been completed.  Experienced some mild nausea helped by antiemetics.   Has occasional HA's.  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   No HFS.     April 01 2023:  Cycle 2 XelOx  April 17 2023:  Has lost 4 lbs.  Appetite diminished due to dysgeusia.  No metallic taste.  Edges of tongue get red and sore and painful.  Mouth feels dry despite drinking a lot of water and tea.  Not using anything for her mouth sores.   Fatigue increased.  Has cold neuropathy and cold induced pharyngospasm.      Begin Magic mouthwash for mucositis.     April 22 2023:  Cycle 3 XelOx.    April 30 2023:    Patient called office yesterday stating that she was anorectic, nauseated, having mouth sores and diarrhea.  Has lost 13 lbs.  Has difficulty remembering details which complicates history taking.  States that anorexia began after starting this round of chemotherapy.  "Food does not get past my tongue"; did not recognize this was nausea so did not begin taking antiemetics until 3 days ago.  Only taking antiemetics bid.  Mouth feels dry but not painful.  No emesis.  Began with diarrhea in the form of 5 to 6 stools per day but does not believe that she is taking imodium for it.  Dysgeusia  even to water.   Not using magic mouthwash regularly  May 02 2023:    Diarrhea improved with imodium.  Using ondansetron alternating with compazine; no emesis with improvement in nausea.  Eating limited by mucositis which she can't say is helped by magic mouthwash.  Not eating cold foods.  Has gained 2 lbs in the last two days.  To complete the course of Xeloda on May 06 2023.    WBC 4.7 hemoglobin 11.6 platelet count 61; 60 segs 23 lymphs 13 monos 4 eos CMP notable for potassium 3.4 glucose 103  May 08 2023:   Scheduled follow-up regarding colon cancer. Has lost 4 lbs since last visit owing to anorexia.   No mouth sores but has dysgeusia and mouth feels dry.  Using magic mouthwash.  Nauseated but without emesis.  Continues to have 5 to 6 bowel movements daily.  Using imodium 2 mg q 5 hrs.    Instructed patient to use imodium 4 mg po q 4 hrs PRN diarrhea Adding lomotil up to qid PRN Xeloda has not been shipped to patient.   Will dose reduce Xeloda from 2000 mg bid to  1500 mg bid.    May 13 2023:  Cycle 4 XelOX.  Received total of 1 liter IVF during that visit due to hypotension, potassium due to hypokalemia.  Patient was told to increase dose of supplemental potassium  May 21 2023:  Seen as a work in due to side effects from chemotherapy.  Has lost 11 lbs since last visit.  Has been experiencing liquid stools sometimes almost hourly.  No nausea, emesis, mouth sores. Abdominal cramping. No fevers.  Appetite decreased.   Taking imodium 2 mg bid.  Not certain if she is taking lomotil.   Received IVF, IV potassium in infusion center WBC 3.8 hemoglobin 10.4 platelet count 50 CMP notable for potassium 3.4 AST 38 albumin 3.8 magnesium 1.7 DPD 5-fluorouracil toxicity panel sent  May 22 2023:  Fell backwards on steps outside.  Struck right elbow and knee but not head.  Skin tear to right elbow.   May 23, 2023: Scheduled follow-up to monitor side effects from chemotherapy.  Anorectic but eating  some.  Has gained 1 lbs.  Today experienced flash HA and accompanied by chest pain which last 2 to 4 minutes.  Yesterday had three diarrheal stools.  Significant improvement since taking Ondansetron, Lomotil and Imodium on scheduled basis.  Spending most of time sleeping.  Lost balance twice today.  Will stop Xeloda and arrange for hospitalization.    May 30 2023:  Post hospital follow up.  Has gained 5 lbs due to edema acquired during hospitalization.  Did not bring walker today but had one yesterday. Still having watery four to five times daily.  Experiencing LLQ abdominal pain.  Anorectic.  Using imodium twice daily.  Husband not certain how many times she is using lomotil daily.  May be using ondansetron bid.  Memory not good but may be improving slightly.  Will start Levaquin/Flagyl for possible diverticulitis.    June 05 2023:  Meningococcal vaccine injection June 06 2023:  Ultimiris   June 10 2023:    Diarrhea has resolved.  Appetite has improved.  Memory still an issue.  Accidentally took mother's Keppra and Gabapentin over the weekend which were out on the table which resulted in confusion and worsening of balance.  Yesterday while coming into the house fell backwards; no LOC, no head or neck pain.  Husband called poison control which recommended conservative management.    Reviewed mediations and struck off medications not being used.  Will start dyazide daily for edema  June 12 2023: WBC 3.3 hemoglobin 10.1 MCV 105 platelet count 70.  Reticulocyte count 3.9% haptoglobin 27 LDH 182 Alb 3.4 Cr 0.97  June 17, 2023:  Has lost 9 pounds since last visit due to diuresis.  Eating one meal per day.  Appetite a bit improved.  Memory appears to be improving.  Still having gait issues; needs to ambulate with a walker to prevent falls.  Had a fall outside her home while picking figs and did not have a walker.   Reviewed results of labs with patient and husband.  Taking PCN twice daily   WBC  3.4 hemoglobin 10.1 MCV 104 platelet count 74.  Reticulocyte count 2.6 haptoglobin 60  LDH 159 Creatinine 1.26 albumin 3.6  June 20 2023:  Ultimiris  July 01 2023:   No weight change.  Had some diarrhea but did not use anything for it.  No falls since last visit.  Appetite better.  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  Compliant with prophylactic PCN.  LE edema improved.  Anticipate 6 months of therapy  WBC 4.0 hemoglobin 10.9 MCV 103 platelet count 77.  Reticulocyte count 1.4% LDH 148.  Haptoglobin pending CMP notable for glucose 112 creatinine 0.9 albumin 3.8  July 22 2023:  .  Feeling well.  Cognition continues to improve.  To see Neurologist in October.   Would encourage consideration for tapering some of the psychotropic medications.    WBC 4.1 hemoglobin 10.7 MCV 99.7 platelet count 109  LDH 142 Chemistry is notable for glucose 104  August 12 2023:    Has gained 5 lbs since last visit.  Taking care of mother who has memory issues.  No abdominal pain.  To see Neurology on September 04 2023.   No changes in psychotropic medications since last visit.   Thinking clear.  No HA's since last visit.     WBC 3.9 hemoglobin 10.6 platelet count 101 LDH 145 ferritin 38 B12 494 ferritin 10.7 reticulocyte count 1.8%  August 15 2023:  Ultimiris  August 29 2023- through September 06 2023: Received a total of 800 mg Venofer   September 09 2023:  Scheduled follow up for MAHA and colon cancer.  Feels well.  Saw neurology since last visit, no changes and held on neuropsych testing.  Still has some memory issues.   Remains on prophylactic antibiotics.    WBC 4.4 hemoglobin 11.4 MCV 92 platelet count 104 differential normal LDH 156 ferritin 238 CMP notable for glucose of 100  March 2025:  Follow up colonoscopy and CT needed   Review of Systems  Constitutional:  Negative for appetite change, chills, fatigue, fever and unexpected weight change.  HENT:   Negative for mouth  sores, nosebleeds, sore throat, tinnitus and trouble swallowing.   Eyes:  Negative for eye problems and icterus.       Vision changes:  None  Respiratory:  Negative for chest tightness, hemoptysis, shortness of breath and wheezing.        One month of nonproductive cough.  No history of seasonable allergies  Cardiovascular:  Negative for chest pain and leg swelling.  Gastrointestinal:  Negative for constipation, diarrhea, nausea, rectal pain and vomiting.  Endocrine: Negative for hot flashes.       Cold intolerance:  none Heat intolerance:  none  Genitourinary:  Negative for bladder incontinence, difficulty urinating, dysuria, frequency, hematuria and nocturia.   Musculoskeletal:  Negative for arthralgias, back pain, gait problem, myalgias, neck pain and neck stiffness.  Skin:  Negative for itching, rash and wound.  Neurological:  Negative for dizziness, extremity weakness, gait problem, headaches, numbness, seizures and speech difficulty.  Hematological:  Negative for adenopathy. Does not bruise/bleed easily.  Psychiatric/Behavioral:  Negative for confusion, sleep disturbance and suicidal ideas. The patient is not nervous/anxious.     MEDICAL HISTORY: Past Medical History:  Diagnosis Date   Abnormal glucose    Achilles tendinitis of left lower extremity    Acute suppurative arthritis due to bacteria (HCC) 03/09/2011   Overview:  Last Assessment & Plan:  Methicillin sensitive staphylococcal aureus septic hip. She clearly does need I&D of the hip. I will have her continue the doxycycline for now but stop it 7 days prior to her surgery to maximize the yield on the cultures in the operating room though I be shocked if we don't find methicillin sensitive staph aureus again.. I agree with removing as much of the pros   Anal fissure 11/30/2015   Arthritis    right hip   Atypical  chest pain 04/09/2016   Bilateral edema of lower extremity    BMI 39.0-39.9,adult    Cancer [facility])    SKIN CANCER    Carpal tunnel syndrome 03/09/2011   Overview:  Last Assessment & Plan:  Clinically this seems to be carpal tunnel syndrome. Giving given her history of infection though we can keep in the back of our mind the idea that she will have a cervical spine problem but I think this is unlikely at this point in time medics carpal tunnel syndrome fits clinically this was a positive Tinel's sign however in for a brace for her.   Chronic vaginitis    Chronic venous insufficiency    Depression    Dyslipidemia    Fatigue 10/13/2015   Fibromyalgia    GERD without esophagitis    History of immune thrombocytopenia 03/09/2011   History of operative procedure on hip 03/10/2012   History of TTP (thrombotic thrombocytopenic purpura)    Hypokalemia    Lichen sclerosus et atrophicus of the vulva 11/30/2015   Major depressive disorder, recurrent, moderate (HCC)    Methicillin susceptible Staphylococcus aureus infection 03/09/2011   Overview:  Last Assessment & Plan:  This is undoubtedlythe culprit organism   Migraine    Morbid obesity with BMI of 45.0-49.9, adult (HCC) 10/13/2015   Morbidly obese (HCC)    MSSA (methicillin susceptible Staphylococcus aureus) infection    Near syncope    Osteoarthritis, chronic    Pain due to total hip replacement (HCC) 09/20/2011   Palpitations 03/04/2019   Postmenopausal    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 syndrome (HCC)    20 yrs ago-not seen anyone for 10 yrs.   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 Mild diverticulosis. Small internal hemorrhoids. Healed anal fissure   ENDOVENOUS ABLATION SAPHENOUS VEIN W/ LASER Right 04/02/2019   endovenous laser ablation right greater saphenous vein by Waverly Ferrari MD    JOINT REPLACEMENT   08/2009   right hip replacement   REVISION TOTAL HIP ARTHROPLASTY     TONSILLECTOMY  1963   TOTAL HIP REVISION  10/08/2011   Procedure: TOTAL HIP REVISION;  Surgeon: [name];  Location: [facility];  Service: Orthopedics;  Laterality: Right;  Resection of Right Total Hip/Extended Trochanteric Osteotomy/Placement of Antibiotic Total Hip Cemented by Depuy   TOTAL HIP REVISION  03/10/2012   Procedure: TOTAL HIP REVISION;  Surgeon: [name], MD;  Location: [facility];  Service: Orthopedics;  Laterality: Right;  Reimplantation/Revision of a Right Total Hip and Removal of Cemented Implant    SOCIAL HISTORY: Social History   Socioeconomic History   Marital status: Married    Spouse name: Raiford Noble   Number of children: Not on file   Years of education: 12+7   Highest education level: Bachelor's degree (e.g., BA, AB, BS)  Occupational History   Occupation: Magazine features editor: [employer]  Tobacco Use   Smoking status: Former    Current packs/day: 0.00    Average packs/day: 1.5 packs/day for 20.0 years (30.0 ttl pk-yrs)    Types: Cigarettes    Start date: 10/04/1970    Quit date: 10/04/1990    Years since quitting: 32.9   Smokeless tobacco: Former    Quit date: 03/09/1991  Vaping Use   Vaping status: Never Used  Substance and Sexual Activity   Alcohol use: Yes    Comment: OCCASIONAL   Drug use: No   Sexual activity: Yes    Partners: Male  Other Topics Concern   Not on file  Social History Narrative   Not on file   Social Determinants of Health   Financial Resource Strain: Not on file  Food Insecurity: Low Risk  (02/04/2023)   [facility]   Hunger Vital Sign    Worried About Running Out of Food in the Last Year: Never true    Ran Out of Food in the Last Year: Never true  Transportation Needs: Not on file (02/04/2023)  Physical Activity: Not on file  Stress: Not on file  Social Connections: Not on file  Intimate Partner Violence: Not  on file    FAMILY HISTORY Family History  Problem Relation Age of Onset   Hypertension Mother    Hypertension Father    Alzheimer's disease Father    Prostate cancer Father    Hypertension Maternal Grandfather    Seizures Other    Colon cancer Neg Hx    Stomach cancer Neg Hx    Rectal cancer Neg Hx    Esophageal cancer Neg Hx     ALLERGIES:  has No Known Allergies.  MEDICATIONS:  Current Outpatient Medications  Medication Sig Dispense Refill   aluminum-magnesium hydroxide 200-200 MG/5ML suspension Take 15 mLs by mouth every 6 (six) hours as needed for indigestion.     buPROPion (WELLBUTRIN XL) 150 MG 24 hr tablet Take 150 mg by mouth every evening.     clobetasol cream (TEMOVATE) 0.05 % Apply 1 Application topically as needed.     clotrimazole-betamethasone (LOTRISONE) cream Apply 1 Application topically 2 (two) times daily.     divalproex (DEPAKOTE) 250 MG DR tablet Take 250 mg by mouth 2 (two) times daily.     DULoxetine (CYMBALTA) 60 MG capsule Take 60 mg by mouth at bedtime.      esomeprazole (NEXIUM) 40 MG capsule Take 40 mg by mouth daily. Pt states has been on for years     loperamide (IMODIUM) 2 MG capsule Take by mouth.     Multiple Vitamin (MULTIVITAMIN WITH MINERALS) TABS tablet Take 1 tablet by mouth daily.     nortriptyline (PAMELOR) 10 MG capsule Take 10 mg by mouth.     penicillin v potassium (VEETID) 500 MG tablet Take 1 tablet (500 mg total) by mouth 2 (two) times daily. 180 tablet 3   potassium chloride SA (KLOR-CON M) 20 MEQ tablet Take 3 tablet today, then start 1 tablet twice daily 64 tablet 0   SUMAtriptan (IMITREX) 25 MG tablet Take 25 mg by mouth.     triamterene-hydrochlorothiazide (DYAZIDE) 37.5-25 MG capsule Take 1 capsule by mouth daily.     No current facility-administered medications for this visit.    PHYSICAL EXAMINATION:  ECOG PERFORMANCE STATUS: 1 - Symptomatic but completely ambulatory   There were no vitals filed for this  visit.      There were no vitals filed for this visit.       Physical Exam Vitals and nursing note reviewed.  Constitutional:      General: She is not in acute distress.    Appearance: She is obese. She is not ill-appearing, toxic-appearing or diaphoretic.     Comments: Here with husband.  More robust than last visit.  Wearing makeup for the  first time in months  HENT:     Head: Normocephalic and atraumatic.     Right Ear: External ear normal.     Left Ear: External ear normal.     Nose: Nose normal. No congestion or rhinorrhea.     Mouth/Throat:     Mouth: Mucous membranes are dry.     Pharynx: No oropharyngeal exudate or posterior oropharyngeal erythema.  Eyes:     General: No scleral icterus.    Extraocular Movements: Extraocular movements intact.     Conjunctiva/sclera: Conjunctivae normal.     Pupils: Pupils are equal, round, and reactive to light.  Cardiovascular:     Rate and Rhythm: Normal rate.     Heart sounds: No murmur heard.    No friction rub. No gallop.  Abdominal:     General: Bowel sounds are normal.     Palpations: Abdomen is soft.     Tenderness: There is no abdominal tenderness. There is no guarding or rebound.  Musculoskeletal:        General: No swelling, tenderness or deformity.     Cervical back: Normal range of motion and neck supple. No rigidity or tenderness.     Comments: Bilateral LE edema continues to improve.      Lymphadenopathy:     Head:     Right side of head: No submental, submandibular, tonsillar, preauricular, posterior auricular or occipital adenopathy.     Left side of head: No submental, submandibular, tonsillar, preauricular, posterior auricular or occipital adenopathy.     Cervical: No cervical adenopathy.     Right cervical: No superficial, deep or posterior cervical adenopathy.    Left cervical: No superficial, deep or posterior cervical adenopathy.     Upper Body:     Right upper body: No supraclavicular, axillary,  pectoral or epitrochlear adenopathy.     Left upper body: No supraclavicular, axillary, pectoral or epitrochlear adenopathy.  Skin:    General: Skin is warm and dry.     Coloration: Skin is not jaundiced.     Comments: Ecchymoses pretibial regions bilateral  Neurological:     General: No focal deficit present.     Mental Status: She is alert and oriented to person, place, and time.     Cranial Nerves: No cranial nerve deficit.     Comments: Seated in chair.  Much more interactive and conversant.      Psychiatric:     Comments: Forgetful.  Talkative.  Insight not good      LABORATORY DATA: I have personally reviewed the data as listed:  Appointment on 08/12/2023  Component Date Value Ref Range Status   LDH 08/12/2023 145  98 - 192 U/L Final   Performed at [facility], [address], [place], [place] [address]   Vitamin B-12 08/12/2023 494  180 - 914 pg/mL Final   Comment: (NOTE) This assay is not validated for testing neonatal or myeloproliferative syndrome specimens for Vitamin B12 levels. Performed at [facility], [address] [place], [place] [address]    Folate 08/12/2023 10.7  >5.9 ng/mL Final   Performed at [facility], [address], [place], [place] [address]   Retic Ct Pct 08/12/2023 1.8  0.4 - 3.1 % Final   RBC. 08/12/2023 3.28 (L)  3.87 - 5.11 MIL/uL Final   Retic Count, Absolute 08/12/2023 57.4  19.0 - 186.0 K/uL Final   Immature Retic Fract 08/12/2023 7.5  2.3 - 15.9 % Final  Performed at [facility], [address], [place], [place] [address]   Haptoglobin 08/12/2023 62  37 - 355 mg/dL Final   Comment: (NOTE) Performed At: [facility] [address] [place], [place] [id number] Jolene Schimke MD OZ:[id number]    Ferritin 08/12/2023 38  11 - 307 ng/mL Final   Performed at [facility], [address], [place], [place] [address]   Sodium 08/12/2023  141  135 - 145 mmol/L Final   Potassium 08/12/2023 4.1  3.5 - 5.1 mmol/L Final   Chloride 08/12/2023 108  98 - 111 mmol/L Final   CO2 08/12/2023 24  22 - 32 mmol/L Final   Glucose, Bld 08/12/2023 97  70 - 99 mg/dL Final   Glucose reference range applies only to samples taken after fasting for at least 8 hours.   BUN 08/12/2023 14  8 - 23 mg/dL Final   Creatinine, Ser 08/12/2023 0.66  0.44 - 1.00 mg/dL Final   Calcium 38/75/6433 9.5  8.9 - 10.3 mg/dL Final   Total Protein 29/51/8841 6.6  6.5 - 8.1 g/dL Final   Albumin 66/03/3015 3.7  3.5 - 5.0 g/dL Final   AST 10/30/3233 30  15 - 41 U/L Final   ALT 08/12/2023 22  0 - 44 U/L Final   Alkaline Phosphatase 08/12/2023 75  38 - 126 U/L Final   Total Bilirubin 08/12/2023 0.6  0.3 - 1.2 mg/dL Final   GFR, Estimated 08/12/2023 >60  >60 mL/min Final   Comment: (NOTE) Calculated using the CKD-EPI Creatinine Equation (2021)    Anion gap 08/12/2023 9  5 - 15 Final   Performed at [facility], [address] [place], [place] [address]   WBC 08/12/2023 3.9 (L)  4.0 - 10.5 K/uL Final   RBC 08/12/2023 3.29 (L)  3.87 - 5.11 MIL/uL Final   Hemoglobin 08/12/2023 10.6 (L)  12.0 - 15.0 g/dL Final   HCT 02/54/2706 32.0 (L)  36.0 - 46.0 % Final   MCV 08/12/2023 97.3  80.0 - 100.0 fL Final   MCH 08/12/2023 32.2  26.0 - 34.0 pg Final   MCHC 08/12/2023 33.1  30.0 - 36.0 g/dL Final   RDW 23/76/2831 12.2  11.5 - 15.5 % Final   Platelets 08/12/2023 101 (L)  150 - 400 K/uL Final   nRBC 08/12/2023 0.0  0.0 - 0.2 % Final   Neutrophils Relative % 08/12/2023 63  % Final   Neutro Abs 08/12/2023 2.5  1.7 - 7.7 K/uL Final   Lymphocytes Relative 08/12/2023 23  % Final   Lymphs Abs 08/12/2023 0.9  0.7 - 4.0 K/uL Final   Monocytes Relative 08/12/2023 11  % Final   Monocytes Absolute 08/12/2023 0.4  0.1 - 1.0 K/uL Final   Eosinophils Relative 08/12/2023 1  % Final   Eosinophils Absolute 08/12/2023 0.1  0.0 - 0.5 K/uL Final   Basophils Relative  08/12/2023 0  % Final   Basophils Absolute 08/12/2023 0.0  0.0 - 0.1 K/uL Final   Immature Granulocytes 08/12/2023 2  % Final   Abs Immature Granulocytes 08/12/2023 0.06  0.00 - 0.07 K/uL Final   Performed at [facility], [address] [place], [place] [address]    RADIOGRAPHIC STUDIES: I have personally reviewed the radiological images as listed and agree with the findings in the report  No results found.  ASSESSMENT/PLAN  67 y.o. female is here because of  colon cancer.  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 Mass was partially circumferential (involves < 1/3rd of the lumen circumference). 10 mm polyp in proximal ascending colon which was sessile.  Pathology ulcerated mass- invasive moderately differentiated adenocarcinoma arising within a tubular adenoma with high-grade dysplasia   January 15 2023- CEA 2.9 Chromogranin A 564  January 17 2023- CT CAP  Short segment asymmetric wall thickening of the transverse colon measuring 3.3 cm in length.   Small adjacent soft tissue nodule/lymph nodes adjacent to the colonic wall thickening measuring 6 mm, suspicious  for local nodal disease. Prominent/mildly enlarged right upper quadrant lymph nodes similar to January 26, 2019 and favored reactive.  No distant metastatic disease.  Hepatomegaly with nodular hepatic contour, suggestive of cirrhosis Cholelithiasis without findings of acute cholecystitis.   Incidental right thyroid nodule   January 23 2023- Chromogranin A 294  February 04 2023:  Transverse colectomy.  Pathology showed adenocarcinoma invading visceral  peritoneum, Grade 3.  All of 30 lymph nodes negative for tumor.  MSI intact Feb 25 2023- On basis of spontaneous improvement in chromogranin A will hold on Dodatate PET.     Therapeutics-  Feb 25 2023- Reviewed [facility] treatment guidelines with patient and husband.  They are agreeable to proceed with adjuvant CAPOX x 6 months.   Mar 11 2023- Cycle 1 XelOx Mar 20 2023- Tolerated cycle 1 well with only some mild cold neuropathy April 01 2023- Cycle 2 XelOx   April 22 2023: Cycle 3 XelOx.              April 30 2023- Experiencing mucositis, anorexia, nausea and diarrhea.    May 13 2023: Cycle 4 XelOX.  Dose reduced Xeloda from 2000 mg bid to 1500 mg bid due to GI symptoms.  This will be her last cycle of adjuvant therapy  May 23 2023- Stopping Xeloda due to side effects and referring for admission.  Contacted ED attending  August 12 2023- Will need follow up CT and colonoscopy in the future   March 2025-will need CT and colonoscopy for surveillance    Chemotherapy induced nausea and diarrhea             April 30 2023-  Instructed patient to do the following-- Take Ondansetron 8 mg, four times daily.  Take Imodium 4 mg every 4 hrs for diarrhea Use the magic mouthwash every 4 hours while awake.  Arranged for patient to receive IVF and antiemetics.  To follow up in 2 days to assess progress     May 02 2023- To complete this course of Xeloda on July 15th.  Should improve with the week off between then and beginning of Cycle 4.  Anticipate dose reduction in Xeloda with Cycle 4.  Sx under better control today than at last visit due to better use of supportive care May 08 2023-  Continues to have 5 to 6 bowel movements daily.  Using imodium 2 mg q 5 hrs.  Instructed patient to use imodium 4 mg po q 4 hrs PRN diarrhea Adding lomotil up to qid PRN.  Has required potassium replacement due to diarrheal losses May 21 2023- Continues to have significant chemotherapy induced diarrhea.  Not using  supportive care  medications to maximum potential. Provided written instructions to patient and husband again today.  Adding Levaquin 500 mg daily.  Sending stool for C diff.  Referring to infusion for IVF.  Chemistries sent to guide electrolyte replacement.  Follow up in 2 days.   Sent testing to evaluate for DPD deficiency.  Discussed plans with clinical pharmacist.   May 23 2023- Diarrhea improved with use of scheduled imodium, lomotil, ondansetron and since starting Levaquin daily.  Remains dehydrated as judged by exam (mucosa and skin dry, tachycardic)  May 30 2023- Diarrhea persists. Began empiric Levaquin/Flagyl for diverticulitis/gastroenteritis  July 22, 2023--diarrhea resolved.  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  Reviewed medication list to remove non-essential agents; particularly those with CNS side effects  June 17 2023- Mental status improving.    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  Cognition continues to improve.  To see neurologist in October.  Encouraged consideration of tapering some of the psychotropic medications if possible. August 12 2023- Continues to improve.   September 09, 2023-has seen neurology since last visit.  Mental status continues to improve albeit  incrementally at this point.  Plans for neuropsych testing on hold.  Suspect again that most of the mental status issues were related to the microangiopathic hemolytic anemia .    Microangiopathic hemolytic anemia             May 25 2023- Haptoglobin < 30.  GI pathogen panel negative negative for Shiga toxin and bacteria known to produce Shiga Toxin              May 30 2023- Laboratory evaluation notable for Hgb 10.1 PLT 84, LDH 255.  DAT negative.  Haptoglobin < 10.  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  Arranging for treatment with Ultomiris.  Arranged for immunization against meningococcus and begin PCN prophylaxis June 05 2023- Meningococcal vaccine June 06 2023- Ultomiris load June 10 2023- GI symptoms improving.  June 09 2023- Retic count improved to 2.6.  LDH and haptoglobin normal.  These changes along with improvement in GI Sx and mental status indicate response to treatment June 20 2023: Ultimiris  July 01, 2023-hemoglobin improved to 10.9.  Platelet count slightly improved at 77.  LDH improved at 140 albumin improved at 3.8.  All of these plus improvement in mental status and edema indicate overall response to therapy July 22 2023-Hemoglobin essentially stable at 10.7.  MCV platelet count continues to improve August 12 2023- Hgb 10.6, LDH and PLT stable.  Anemia in part secondary to iron deficiency for which will arrange replacement.  Thinking continues to improve.  Anticipate 6  month course of Ultomiris followed by close observation with low threshold to restart August 15, 2023--Received Ultomiris September 09, 2023: Haptoglobin pending.  LDH normal.  Patient has mild thrombocytopenia without bleeding issues.  Hemoglobin improved at 11.4 in part due to administration of IV iron We will continue to follow clinically and with labs.  Anticipate at least 75-month treatment with Ultomiris with plans to DC and follow carefully.  Explained to patient and husband she should continue 1 antibiotic prophylaxis for at least 6 months following discontinuation of therapy for atypical HUS.  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  At that time TTP and HUS were distinguishable only on clinical grounds and were thought of as being almost the same disease.  The entity of atypical HUS was not known at the time.  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  No evidence of inflammatory bowel disease by colonoscopy.    Chromogranin A 564  January 23 2023- Repeat Chromogranin A improved  Feb 25 2023- Diarrhea and chromogranin A improved therefore holding on Dotatate PET/CT    Poor venous access:    Mar 11 2023- Port placement   Headaches Mar 07 2023- Improved following surgery likely related to a component of general anesthesia.  To see neurology  Possible cirrhosis  January 17 2023- Nodular liver contour noted on CT CAP    Cancer Staging  Colon cancer [facility]) Staging form: Colon and Rectum, AJCC 8th Edition - Clinical stage from 02/25/2023: Stage IIB (cT4a, cN0, cM0) - Signed by Loni Muse, MD on 02/25/2023 Histopathologic type: Adenocarcinoma, NOS Stage prefix: Initial diagnosis Total positive nodes: 0 Total nodes examined: 30 Histologic grade (G):  G3 Histologic grading system: 4 grade system    No problem-specific Assessment & Plan notes found for this encounter.    No orders of the defined types were placed in this encounter.   40  minutes was spent in patient care.  This included time spent preparing to see the patient (e.g., review of tests), obtaining and/or reviewing separately obtained history, counseling and educating the patient/family/caregiver, ordering medications, tests, or procedures; documenting clinical information in the electronic or other health record, independently interpreting results and communicating results to the patient/family/caregiver as well as coordination of care.       All questions were answered. The patient knows to call the clinic with any problems, questions or concerns.  This note was electronically signed.    Loni Muse, MD  09/09/2023 8:25 AM

## 2023-09-10 ENCOUNTER — Other Ambulatory Visit: Payer: Self-pay

## 2023-09-10 LAB — HAPTOGLOBIN: Haptoglobin: 66 mg/dL (ref 37–355)

## 2023-09-17 ENCOUNTER — Other Ambulatory Visit: Payer: Self-pay

## 2023-10-03 ENCOUNTER — Other Ambulatory Visit: Payer: Self-pay | Admitting: Oncology

## 2023-10-07 ENCOUNTER — Other Ambulatory Visit: Payer: Medicare PPO

## 2023-10-07 ENCOUNTER — Ambulatory Visit: Payer: Medicare PPO | Admitting: Oncology

## 2023-10-07 ENCOUNTER — Ambulatory Visit: Payer: Medicare PPO

## 2023-10-10 ENCOUNTER — Inpatient Hospital Stay: Payer: Medicare PPO

## 2023-10-10 ENCOUNTER — Inpatient Hospital Stay: Payer: Medicare PPO | Attending: Oncology

## 2023-10-10 ENCOUNTER — Other Ambulatory Visit: Payer: Self-pay

## 2023-10-10 ENCOUNTER — Encounter: Payer: Self-pay | Admitting: Oncology

## 2023-10-10 VITALS — BP 115/63 | HR 80 | Temp 97.7°F | Resp 20 | Ht 65.9 in | Wt 212.0 lb

## 2023-10-10 DIAGNOSIS — C184 Malignant neoplasm of transverse colon: Secondary | ICD-10-CM

## 2023-10-10 DIAGNOSIS — M311 Thrombotic microangiopathy, unspecified: Secondary | ICD-10-CM

## 2023-10-10 DIAGNOSIS — Z79899 Other long term (current) drug therapy: Secondary | ICD-10-CM | POA: Diagnosis not present

## 2023-10-10 DIAGNOSIS — Z23 Encounter for immunization: Secondary | ICD-10-CM | POA: Diagnosis not present

## 2023-10-10 DIAGNOSIS — Z862 Personal history of diseases of the blood and blood-forming organs and certain disorders involving the immune mechanism: Secondary | ICD-10-CM

## 2023-10-10 DIAGNOSIS — D5939 Other hemolytic-uremic syndrome: Secondary | ICD-10-CM

## 2023-10-10 LAB — LACTATE DEHYDROGENASE: LDH: 175 U/L (ref 98–192)

## 2023-10-10 LAB — CBC WITH DIFFERENTIAL (CANCER CENTER ONLY)
Abs Immature Granulocytes: 0.03 10*3/uL (ref 0.00–0.07)
Basophils Absolute: 0 10*3/uL (ref 0.0–0.1)
Basophils Relative: 1 %
Eosinophils Absolute: 0.1 10*3/uL (ref 0.0–0.5)
Eosinophils Relative: 2 %
HCT: 33.5 % — ABNORMAL LOW (ref 36.0–46.0)
Hemoglobin: 12 g/dL (ref 12.0–15.0)
Immature Granulocytes: 1 %
Lymphocytes Relative: 31 %
Lymphs Abs: 1.2 10*3/uL (ref 0.7–4.0)
MCH: 32 pg (ref 26.0–34.0)
MCHC: 35.8 g/dL (ref 30.0–36.0)
MCV: 89.3 fL (ref 80.0–100.0)
Monocytes Absolute: 0.4 10*3/uL (ref 0.1–1.0)
Monocytes Relative: 10 %
Neutro Abs: 2.3 10*3/uL (ref 1.7–7.7)
Neutrophils Relative %: 55 %
Platelet Count: 93 10*3/uL — ABNORMAL LOW (ref 150–400)
RBC: 3.75 MIL/uL — ABNORMAL LOW (ref 3.87–5.11)
RDW: 11.9 % (ref 11.5–15.5)
WBC Count: 4 10*3/uL (ref 4.0–10.5)
nRBC: 0 % (ref 0.0–0.2)
nRBC: 0 /100{WBCs}

## 2023-10-10 LAB — CMP (CANCER CENTER ONLY)
ALT: 11 U/L (ref 0–44)
AST: 21 U/L (ref 15–41)
Albumin: 4.1 g/dL (ref 3.5–5.0)
Alkaline Phosphatase: 84 U/L (ref 38–126)
Anion gap: 9 (ref 5–15)
BUN: 9 mg/dL (ref 8–23)
CO2: 26 mmol/L (ref 22–32)
Calcium: 9.6 mg/dL (ref 8.9–10.3)
Chloride: 106 mmol/L (ref 98–111)
Creatinine: 0.71 mg/dL (ref 0.44–1.00)
GFR, Estimated: >60 mL/min (ref >60)
Glucose, Bld: 101 mg/dL — ABNORMAL HIGH (ref 70–99)
Potassium: 3.7 mmol/L (ref 3.5–5.1)
Sodium: 141 mmol/L (ref 135–145)
Total Bilirubin: 0.4 mg/dL (ref <1.2)
Total Protein: 6.3 g/dL — ABNORMAL LOW (ref 6.5–8.1)

## 2023-10-10 LAB — RETICULOCYTES
Immature Retic Fract: 5.5 % (ref 2.3–15.9)
RBC.: 3.75 MIL/uL — ABNORMAL LOW (ref 3.87–5.11)
Retic Count, Absolute: 65.7 10*3/uL (ref 19.0–186.0)
Retic Ct Pct: 1.7 % (ref 0.4–3.1)

## 2023-10-10 LAB — FERRITIN: Ferritin: 159 ng/mL (ref 11–307)

## 2023-10-10 MED ORDER — HEPARIN SOD (PORK) LOCK FLUSH 100 UNIT/ML IV SOLN
500.0000 [IU] | Freq: Once | INTRAVENOUS | Status: AC | PRN
Start: 2023-10-10 — End: 2023-10-10
  Administered 2023-10-10: 500 [IU]

## 2023-10-10 MED ORDER — SODIUM CHLORIDE 0.9% FLUSH
10.0000 mL | INTRAVENOUS | Status: DC | PRN
  Administered 2023-10-10: 10 mL

## 2023-10-10 MED ORDER — RAVULIZUMAB-CWVZ 300 MG/3ML IV SOLN
3300.0000 mg | Freq: Once | INTRAVENOUS | Status: AC
  Administered 2023-10-10: 3300 mg via INTRAVENOUS
  Filled 2023-10-10: qty 33

## 2023-10-10 MED ORDER — SODIUM CHLORIDE 0.9 % IV SOLN: Freq: Once | INTRAVENOUS | Status: AC

## 2023-10-10 MED ORDER — MENINGOCOCCAL VAC B (OMV) IM SUSY
0.5000 mL | PREFILLED_SYRINGE | Freq: Once | INTRAMUSCULAR | Status: AC
  Administered 2023-10-10: 0.5 mL via INTRAMUSCULAR
  Filled 2023-10-10: qty 0.5

## 2023-10-10 NOTE — Patient Instructions (Signed)
Meningococcal B (3 strain) Vaccine Injection What is this medication? MENINGOCOCCAL B VACCINE (muh nin jeh KOK kul B vak SEEN) reduces the risk of meningitis. It does not treat meningitis. It is still possible to get meningitis after receiving this vaccine, but the symptoms may be less severe or not last as long. It works by helping your immune system learn how to fight off a future infection. This medicine may be used for other purposes; ask your health care provider or pharmacist if you have questions. COMMON BRAND NAME(S): BEXSERO What should I tell my care team before I take this medication? They need to know if you have any of these conditions: Bleeding disorder Fever or infection Immune system problems An unusual or allergic reaction to meningococcal vaccine, other vaccines, other medications, foods, dyes, or preservatives Pregnant or trying to get pregnant Breastfeeding How should I use this medication? This vaccine is injected into a muscle. It is given by your care team. This vaccine requires 2 doses to get the full benefit. Set a reminder for when your next dose is due. A copy of Vaccine Information Statements will be given before each vaccination. Be sure to read this information carefully each time. This sheet may change often. Talk to your care team to see which vaccines are right for you. Some vaccines should not be used in all age groups. Overdosage: If you think you have taken too much of this medicine contact a poison control center or emergency room at once. NOTE: This medicine is only for you. Do not share this medicine with others. What if I miss a dose? Keep appointments for follow-up doses as directed. It is important not to miss your dose. Call your care team if you are unable to keep an appointment. What may interact with this medication? Medications that lower your chance of fighting an infection Other vaccines This list may not describe all possible interactions.  Give your health care provider a list of all the medicines, herbs, non-prescription drugs, or dietary supplements you use. Also tell them if you smoke, drink alcohol, or use illegal drugs. Some items may interact with your medicine. What should I watch for while using this medication? Visit your care team for regular health checks. Before you receive this vaccine, talk to your care team if you have an acute illness. Vaccines can be given to people with mild acute illness, such as the common cold or diarrhea. Discuss with your care team the risks and benefits of receiving this vaccine during a moderate to severe illness. Your care team may choose to wait to give you the vaccine when you feel better. Report any side effects to your care team or to the Vaccine Adverse Event Reporting System (VAERS) website at https://vaers.LAgents.no. This is only for reporting side effects; VAERs staff do not give medical advice. What side effects may I notice from receiving this medication? Side effects that you should report to your care team as soon as possible: Allergic reactions--skin rash, itching, hives, swelling of the face, lips, tongue, or throat Feeling faint or lightheaded Side effects that usually do not require medical attention (report these to your care team if they continue or are bothersome): Fatigue Headache Joint pain Muscle pain Nausea Pain, redness, or irritation at injection site This list may not describe all possible side effects. Call your doctor for medical advice about side effects. You may report side effects to FDA at 1-800-FDA-1088. Where should I keep my medication? This vaccine is only  given by your care team. It will not be stored at home. NOTE: This sheet is a summary. It may not cover all possible information. If you have questions about this medicine, talk to your doctor, pharmacist, or health care provider.  2024 Elsevier/Gold Standard (2023-01-30 00:00:00) Ravulizumab  Injection What is this medication? RAVULIZUMAB (rav ue LIZ ue mab) treats certain blood conditions that can cause low levels of red blood cells (anemia) and blood clots, such as atypical hemolytic uremic syndrome (aHUS) and paroxysmal nocturnal hemoglobinuria (PNH). It works by slowing down an overactive immune system, which reduces the breakdown of red blood cells. It also prevents blood cells (platelets) from forming a clot. It may also be used to treat a condition that causes muscles to easily weaken or fatigue (myasthenia gravis). It can be used to treat neuromyelitis optica spectrum disorder (NMOSD), a condition that causes inflammation in the nerves of the eyes and spinal cord. It is a monoclonal antibody. This medicine may be used for other purposes; ask your health care provider or pharmacist if you have questions. COMMON BRAND NAME(S): ULTOMIRIS What should I tell my care team before I take this medication? They need to know if you have any of these conditions: Infection An unusual or allergic reaction to ravulizumab, other medications, foods, dyes, or preservatives Pregnant or trying to get pregnant Breastfeeding How should I use this medication? This medication is injected into a vein. It is given by your care team in a hospital or clinic setting. A special MedGuide will be given to you before each treatment. Be sure to read this information carefully each time. Talk to your care team about the use of this medication in children. While it may be prescribed for children as young as 1 month for selected conditions, precautions do apply. Overdosage: If you think you have taken too much of this medicine contact a poison control center or emergency room at once. NOTE: This medicine is only for you. Do not share this medicine with others. What if I miss a dose? Keep appointments for follow-up doses. It is important not to miss your dose. Call your care team if you are unable to keep an  appointment. What may interact with this medication? Efgartigimod Immune globulin (IVIG) This list may not describe all possible interactions. Give your health care provider a list of all the medicines, herbs, non-prescription drugs, or dietary supplements you use. Also tell them if you smoke, drink alcohol, or use illegal drugs. Some items may interact with your medicine. What should I watch for while using this medication? Visit your care team for regular checks on your progress. Tell your care team if your symptoms do not start to get better or if they get worse. You may need blood work while you are taking this medication. This medication may increase your risk of getting an infection. Call your care team for advice if you get a fever, chills, sore throat, or other symptoms of a cold or flu. Do not treat yourself. Try to avoid being around people who are sick. What side effects may I notice from receiving this medication? Side effects that you should report to your care team as soon as possible: Allergic reactions--skin rash, itching, hives, swelling of the face, lips, tongue, or throat Infection--fever, chills, cough, sore throat, wounds that don't heal, pain or trouble when passing urine, general feeling of discomfort or being unwell Infusion reactions--chest pain, shortness of breath or trouble breathing, feeling faint or lightheaded Low blood  pressure--dizziness, feeling faint or lightheaded, blurry vision Side effects that usually do not require medical attention (report these to your care team if they continue or are bothersome): Diarrhea Fever Headache Nausea Pain, redness, or irritation at injection site Vomiting This list may not describe all possible side effects. Call your doctor for medical advice about side effects. You may report side effects to FDA at 1-800-FDA-1088. Where should I keep my medication? This medication is given in a hospital or clinic. It will not be stored  at home. NOTE: This sheet is a summary. It may not cover all possible information. If you have questions about this medicine, talk to your doctor, pharmacist, or health care provider.  2024 Elsevier/Gold Standard (2023-01-23 00:00:00)

## 2023-10-11 LAB — HAPTOGLOBIN: Haptoglobin: 49 mg/dL (ref 37–355)

## 2023-10-21 ENCOUNTER — Telehealth: Payer: Self-pay | Admitting: Oncology

## 2023-10-21 ENCOUNTER — Inpatient Hospital Stay (HOSPITAL_BASED_OUTPATIENT_CLINIC_OR_DEPARTMENT_OTHER): Payer: Medicare PPO | Admitting: Oncology

## 2023-10-21 ENCOUNTER — Inpatient Hospital Stay: Payer: Medicare PPO

## 2023-10-21 VITALS — BP 128/76 | HR 82 | Resp 16 | Ht 65.9 in | Wt 217.1 lb

## 2023-10-21 DIAGNOSIS — Z862 Personal history of diseases of the blood and blood-forming organs and certain disorders involving the immune mechanism: Secondary | ICD-10-CM

## 2023-10-21 DIAGNOSIS — Z79899 Other long term (current) drug therapy: Secondary | ICD-10-CM | POA: Diagnosis not present

## 2023-10-21 DIAGNOSIS — K7460 Unspecified cirrhosis of liver: Secondary | ICD-10-CM | POA: Diagnosis not present

## 2023-10-21 DIAGNOSIS — Z8659 Personal history of other mental and behavioral disorders: Secondary | ICD-10-CM

## 2023-10-21 DIAGNOSIS — Z09 Encounter for follow-up examination after completed treatment for conditions other than malignant neoplasm: Secondary | ICD-10-CM

## 2023-10-21 DIAGNOSIS — T50905S Adverse effect of unspecified drugs, medicaments and biological substances, sequela: Secondary | ICD-10-CM | POA: Diagnosis not present

## 2023-10-21 DIAGNOSIS — D594 Other nonautoimmune hemolytic anemias: Secondary | ICD-10-CM

## 2023-10-21 DIAGNOSIS — Z23 Encounter for immunization: Secondary | ICD-10-CM | POA: Diagnosis not present

## 2023-10-21 DIAGNOSIS — C184 Malignant neoplasm of transverse colon: Secondary | ICD-10-CM | POA: Diagnosis not present

## 2023-10-21 NOTE — Telephone Encounter (Signed)
Patient has been scheduled for follow-up visit per 10/21/23 LOS.  Pt given an appt calendar with date and time.

## 2023-10-21 NOTE — Progress Notes (Signed)
[facility] Cancer Follow up Visit:  Patient Care Team: Hurshel Party, NP as PCP - General (Internal Medicine) Loni Muse, MD as Consulting Physician (Internal Medicine)  CHIEF COMPLAINTS/PURPOSE OF CONSULTATION:  Oncology History  Colon cancer [facility])  01/15/2023 Initial Diagnosis   Colon cancer (HCC)   02/25/2023 Cancer Staging   Staging form: Colon and Rectum, AJCC 8th Edition - Clinical stage from 02/25/2023: Stage IIB (cT4a, cN0, cM0) - Signed by Loni Muse, MD on 02/25/2023 Histopathologic type: Adenocarcinoma, NOS Stage prefix: Initial diagnosis Total positive nodes: 0 Total nodes examined: 30 Histologic grade (G): G3 Histologic grading system: 4 grade system   03/11/2023 - 05/13/2023 Chemotherapy   Patient is on Treatment Plan : COLORECTAL Xelox (Capeox)(130/850) q21d       HISTORY OF PRESENTING ILLNESS: [name] 67 y.o. female is here because of  colon cancer Medical history notable for septic arthritis of the hip, obesity, atypical chest pain, skin cancer, carpal tunnel syndrome, chronic vaginitis, chronic venous and sufficiency, TTP, syncope  January 10, 2023: Colonoscopy-ulcerated 2 cm nonobstructing small mass with heaped up margins found in distal transverse colon.  Mass was partially circumferential (involves less than one third of the lumen circumference) no bleeding present.  10 mm polyp in proximal ascending colon which was sessile.  Pathology-Ascending colon polyp was compatible with a sessile serrated adenoma without cytologic dysplasia Transverse colon polyp-invasive moderately differentiated adenocarcinoma arising within a tubular adenoma with high-grade dysplasia  January 15 2023: [facility] Medical Oncology Consult Patient reports that the colonoscopy was performed for surveillance; however, she was also having episodes of intermittent diarrhea x 2 to 3 months.  Stools were not bloody, nor was she passing mucus but they were soft.  In  association with these episodes she would also feel dizzy and note an odd smell, nausea.   To see Dr. Logan Bores from surgery on January 22 2023   Social:  Married.  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  Tobacco quit 24 years.  Rare glass of wine  [facility] Mother alive 75 epileptic, dementia Father died 16 Alzheimers, prostate cancer Brother alive 59 arthritis requiring joint replacement,   WBC 6.4 hemoglobin 12.5 MCV 88 platelet count 178 normal differential CMP normal CEA 2.9 chromogranin A 564  January 17 2023:  CT CAP Short segment asymmetric wall thickening of the transverse colon measuring 3.3 cm in length, compatible with reported history of  primary colonic neoplasm.  Small adjacent soft tissue nodule/lymph nodes adjacent to the colonic wall thickening measuring 6 mm, nonspecific are suspicious  for local nodal disease. Prominent/mildly enlarged right upper quadrant lymph nodes are similar dating back to January 26, 2019 and favored reactive.  No convincing evidence of distant metastatic disease within the chest, abdomen or pelvis.  Hepatomegaly with nodular hepatic contour, suggestive of cirrhosis Cholelithiasis without findings of acute cholecystitis.   Incidental right thyroid nodule measuring   January 18 2023:  Presented to [facility] ED with headaches and chest discomfort.  CT PA negative for PE.  CT head without contrast negative.    January 22 2023:  Scheduled follow up for colon cancer.  Reviewed results of labs and imaging with patient and husband. Will repeat chromgranin A and refer for PET/CT if chromogranin A still significantly elevated.    January 23, 2023: Chromogranin A 294  February 04 2023:  Transverse colectomy Pathology showed adenocarcinoma invading visceral peritoneum, Grade 3.  All of 30 lymph nodes were negative for tumor.  Surgical Stage (T4a, N0 M0)  MSI intact  Feb 25, 2023:   Reviewed results of surgical pathology with patient and husband.  Per NCCN guidelines adjuvant  options are CAPEOX, FOLFOX 6 months, Capecitabine or observation.  Discussed risks and benefits of each  Mar 07 2023:   Bifrontal headaches have returned.  These began in October 2023 but resolved after her colon surgery.  Has seen neurology and underwent MRI brain negative for CNS lesion.  EEG normal.   (Raises the question of what medications in perioperative period helped with the HA's.)  Recovering from a dog bite on dorsum of right hand.  Will receive Capecitabine today.    Mar 11 2023:  Port placement.  Cycle 1 XelOx   Mar 19 2023:  Neurology follow up.    Mar 20 2023:   Agree with Neurology assessment that neurocognitive testing should not be performed it at all, until well after chemotherapy has been completed.  Experienced some mild nausea helped by antiemetics.   Has occasional HA's.  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   No HFS.     April 01 2023:  Cycle 2 XelOx  April 17 2023:  Has lost 4 lbs.  Appetite diminished due to dysgeusia.  No metallic taste.  Edges of tongue get red and sore and painful.  Mouth feels dry despite drinking a lot of water and tea.  Not using anything for her mouth sores.   Fatigue increased.  Has cold neuropathy and cold induced pharyngospasm.      Begin Magic mouthwash for mucositis.     April 22 2023:  Cycle 3 XelOx.    April 30 2023:    Patient called office yesterday stating that she was anorectic, nauseated, having mouth sores and diarrhea.  Has lost 13 lbs.  Has difficulty remembering details which complicates history taking.  States that anorexia began after starting this round of chemotherapy.  "Food does not get past my tongue"; did not recognize this was nausea so did not begin taking antiemetics until 3 days ago.  Only taking antiemetics bid.  Mouth feels dry but not painful.  No emesis.  Began with diarrhea in the form of 5 to 6 stools per day but does not believe that she is taking imodium for it.  Dysgeusia  even to water.   Not using magic mouthwash regularly  May 02 2023:    Diarrhea improved with imodium.  Using ondansetron alternating with compazine; no emesis with improvement in nausea.  Eating limited by mucositis which she can't say is helped by magic mouthwash.  Not eating cold foods.  Has gained 2 lbs in the last two days.  To complete the course of Xeloda on May 06 2023.    WBC 4.7 hemoglobin 11.6 platelet count 61; 60 segs 23 lymphs 13 monos 4 eos CMP notable for potassium 3.4 glucose 103  May 08 2023:   Scheduled follow-up regarding colon cancer. Has lost 4 lbs since last visit owing to anorexia.   No mouth sores but has dysgeusia and mouth feels dry.  Using magic mouthwash.  Nauseated but without emesis.  Continues to have 5 to 6 bowel movements daily.  Using imodium 2 mg q 5 hrs.    Instructed patient to use imodium 4 mg po q 4 hrs PRN diarrhea Adding lomotil up to qid PRN Xeloda has not been shipped to patient.   Will dose reduce Xeloda from 2000 mg bid to  1500 mg bid.    May 13 2023:  Cycle 4 XelOX.  Received total of 1 liter IVF during that visit due to hypotension, potassium due to hypokalemia.  Patient was told to increase dose of supplemental potassium  May 21 2023:  Seen as a work in due to side effects from chemotherapy.  Has lost 11 lbs since last visit.  Has been experiencing liquid stools sometimes almost hourly.  No nausea, emesis, mouth sores. Abdominal cramping. No fevers.  Appetite decreased.   Taking imodium 2 mg bid.  Not certain if she is taking lomotil.   Received IVF, IV potassium in infusion center WBC 3.8 hemoglobin 10.4 platelet count 50 CMP notable for potassium 3.4 AST 38 albumin 3.8 magnesium 1.7 DPD 5-fluorouracil toxicity panel sent  May 22 2023:  Fell backwards on steps outside.  Struck right elbow and knee but not head.  Skin tear to right elbow.   May 23, 2023: Scheduled follow-up to monitor side effects from chemotherapy.  Anorectic but eating  some.  Has gained 1 lbs.  Today experienced flash HA and accompanied by chest pain which last 2 to 4 minutes.  Yesterday had three diarrheal stools.  Significant improvement since taking Ondansetron, Lomotil and Imodium on scheduled basis.  Spending most of time sleeping.  Lost balance twice today.  Will stop Xeloda and arrange for hospitalization.    May 30 2023:  Post hospital follow up.  Has gained 5 lbs due to edema acquired during hospitalization.  Did not bring walker today but had one yesterday. Still having watery four to five times daily.  Experiencing LLQ abdominal pain.  Anorectic.  Using imodium twice daily.  Husband not certain how many times she is using lomotil daily.  May be using ondansetron bid.  Memory not good but may be improving slightly.  Will start Levaquin/Flagyl for possible diverticulitis.    June 05 2023:  Meningococcal vaccine injection June 06 2023:  Ultimiris   June 10 2023:    Diarrhea has resolved.  Appetite has improved.  Memory still an issue.  Accidentally took mother's Keppra and Gabapentin over the weekend which were out on the table which resulted in confusion and worsening of balance.  Yesterday while coming into the house fell backwards; no LOC, no head or neck pain.  Husband called poison control which recommended conservative management.    Reviewed mediations and struck off medications not being used.  Will start dyazide daily for edema  June 12 2023: WBC 3.3 hemoglobin 10.1 MCV 105 platelet count 70.  Reticulocyte count 3.9% haptoglobin 27 LDH 182 Alb 3.4 Cr 0.97  June 17, 2023:  Has lost 9 pounds since last visit due to diuresis.  Eating one meal per day.  Appetite a bit improved.  Memory appears to be improving.  Still having gait issues; needs to ambulate with a walker to prevent falls.  Had a fall outside her home while picking figs and did not have a walker.   Reviewed results of labs with patient and husband.  Taking PCN twice daily   WBC  3.4 hemoglobin 10.1 MCV 104 platelet count 74.  Reticulocyte count 2.6 haptoglobin 60  LDH 159 Creatinine 1.26 albumin 3.6  June 20 2023:  Ultimiris  July 01 2023:   No weight change.  Had some diarrhea but did not use anything for it.  No falls since last visit.  Appetite better.  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  Compliant with prophylactic PCN.  LE edema improved.  Anticipate 6 months of therapy  WBC 4.0 hemoglobin 10.9 MCV 103 platelet count 77.  Reticulocyte count 1.4% LDH 148.  Haptoglobin pending CMP notable for glucose 112 creatinine 0.9 albumin 3.8  July 22 2023:  .  Feeling well.  Cognition continues to improve.  To see Neurologist in October.   Would encourage consideration for tapering some of the psychotropic medications.    WBC 4.1 hemoglobin 10.7 MCV 99.7 platelet count 109  LDH 142 Chemistry is notable for glucose 104  August 12 2023:    Has gained 5 lbs since last visit.  Taking care of mother who has memory issues.  No abdominal pain.  To see Neurology on September 04 2023.   No changes in psychotropic medications since last visit.   Thinking clear.  No HA's since last visit.     WBC 3.9 hemoglobin 10.6 platelet count 101 LDH 145 ferritin 38 B12 494 ferritin 10.7 reticulocyte count 1.8%  August 15 2023:  Ultimiris  August 29 2023- through September 09 2023: Received a total of 1000 mg Venofer   September 09 2023:  Scheduled follow up for MAHA and colon cancer.  Feels well.  Saw neurology since last visit, no changes and held on neuropsych testing.  Still has some memory issues.   Remains on prophylactic antibiotics.    WBC 4.4 hemoglobin 11.4 MCV 92 platelet count 104 differential normal LDH 156 ferritin 238 CMP notable for glucose of 100  October 10 2023: Ultimiris WBC 4.0 Hgb 12.0 PLT 93  Retic 1.7% Ferritin 159 LDH 175  October 21 2023:  Scheduled follow up for MAHA and colon cancer.  Recovering from URI.  Fatigued but  attributes that to poor sleep because mother who has dementia is with altered sleep pattern.  Still has some memory issues but overall this has improved.  Remains on PCN for prophylaxis  December 04 2022:  Ultimiris  March 2025:  Will need follow up colonoscopy and CT    Review of Systems  Constitutional:  Negative for appetite change, chills, fatigue, fever and unexpected weight change.  HENT:   Negative for mouth sores, nosebleeds, sore throat, tinnitus and trouble swallowing.   Eyes:  Negative for eye problems and icterus.       Vision changes:  None  Respiratory:  Negative for chest tightness, hemoptysis, shortness of breath and wheezing.        One month of nonproductive cough.  No history of seasonable allergies  Cardiovascular:  Negative for chest pain and leg swelling.  Gastrointestinal:  Negative for constipation, diarrhea, nausea, rectal pain and vomiting.  Endocrine: Negative for hot flashes.       Cold intolerance:  none Heat intolerance:  none  Genitourinary:  Negative for bladder incontinence, difficulty urinating, dysuria, frequency, hematuria and nocturia.   Musculoskeletal:  Negative for arthralgias, back pain, gait problem, myalgias, neck pain and neck stiffness.  Skin:  Negative for itching, rash and wound.  Neurological:  Negative for dizziness, extremity weakness, gait problem, headaches, numbness, seizures and speech difficulty.  Hematological:  Negative for adenopathy. Does not bruise/bleed easily.  Psychiatric/Behavioral:  Negative for confusion, sleep disturbance and suicidal ideas. The patient is not nervous/anxious.     MEDICAL HISTORY: Past Medical History:  Diagnosis Date   Abnormal glucose    Achilles tendinitis of left lower extremity    Acute suppurative arthritis due to bacteria (HCC) 03/09/2011   Overview:  Last Assessment & Plan:  Methicillin sensitive staphylococcal  aureus septic hip. She clearly does need I&D of the hip. I will have her continue  the doxycycline for now but stop it 7 days prior to her surgery to maximize the yield on the cultures in the operating room though I be shocked if we don't find methicillin sensitive staph aureus again.. I agree with removing as much of the pros   Anal fissure 11/30/2015   Arthritis    right hip   Atypical chest pain 04/09/2016   Bilateral edema of lower extremity    BMI 39.0-39.9,adult    Cancer [facility])    SKIN CANCER   Carpal tunnel syndrome 03/09/2011   Overview:  Last Assessment & Plan:  Clinically this seems to be carpal tunnel syndrome. Giving given her history of infection though we can keep in the back of our mind the idea that she will have a cervical spine problem but I think this is unlikely at this point in time medics carpal tunnel syndrome fits clinically this was a positive Tinel's sign however in for a brace for her.   Chronic vaginitis    Chronic venous insufficiency    Depression    Dyslipidemia    Fatigue 10/13/2015   Fibromyalgia    GERD without esophagitis    History of immune thrombocytopenia 03/09/2011   History of operative procedure on hip 03/10/2012   History of TTP (thrombotic thrombocytopenic purpura)    Hypokalemia    Lichen sclerosus et atrophicus of the vulva 11/30/2015   Major depressive disorder, recurrent, moderate (HCC)    Methicillin susceptible Staphylococcus aureus infection 03/09/2011   Overview:  Last Assessment & Plan:  This is undoubtedlythe culprit organism   Migraine    Morbid obesity with BMI of 45.0-49.9, adult (HCC) 10/13/2015   Morbidly obese (HCC)    MSSA (methicillin susceptible Staphylococcus aureus) infection    Near syncope    Osteoarthritis, chronic    Pain due to total hip replacement (HCC) 09/20/2011   Palpitations 03/04/2019   Postmenopausal    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 syndrome (HCC)    20 yrs ago-not seen anyone for 10  yrs.   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 Mild diverticulosis. Small internal hemorrhoids. Healed anal fissure   ENDOVENOUS ABLATION SAPHENOUS VEIN W/ LASER Right 04/02/2019   endovenous laser ablation right greater saphenous vein by Waverly Ferrari MD    JOINT REPLACEMENT  08/2009   right hip replacement   REVISION TOTAL HIP ARTHROPLASTY     TONSILLECTOMY  1963   TOTAL HIP REVISION  10/08/2011   Procedure: TOTAL HIP REVISION;  Surgeon: [name];  Location: [facility];  Service: Orthopedics;  Laterality: Right;  Resection of Right Total Hip/Extended Trochanteric Osteotomy/Placement of Antibiotic Total Hip Cemented by Depuy   TOTAL HIP REVISION  03/10/2012   Procedure: TOTAL HIP REVISION;  Surgeon: [name], MD;  Location: [facility];  Service: Orthopedics;  Laterality: Right;  Reimplantation/Revision of a Right Total Hip and Removal of Cemented Implant    SOCIAL HISTORY: Social History   Socioeconomic History   Marital status: Married    Spouse name: Raiford Noble   Number of children: Not on file   Years of education: 12+7   Highest education level: Bachelor's degree (e.g., BA, AB,  BS)  Occupational History   Occupation: Magazine features editor: [employer]  Tobacco Use   Smoking status: Former    Current packs/day: 0.00    Average packs/day: 1.5 packs/day for 20.0 years (30.0 ttl pk-yrs)    Types: Cigarettes    Start date: 10/04/1970    Quit date: 10/04/1990    Years since quitting: 33.0   Smokeless tobacco: Former    Quit date: 03/09/1991  Vaping Use   Vaping status: Never Used  Substance and Sexual Activity   Alcohol use: Yes    Comment: OCCASIONAL   Drug use: No   Sexual activity: Yes    Partners: Male  Other Topics Concern   Not on file  Social History Narrative   Not on file   Social Drivers of Health   Financial  Resource Strain: Not on file  Food Insecurity: Low Risk  (02/04/2023)   [facility]   Hunger Vital Sign    Worried About Running Out of Food in the Last Year: Never true    Ran Out of Food in the Last Year: Never true  Transportation Needs: Not on file (02/04/2023)  Physical Activity: Not on file  Stress: Not on file  Social Connections: Not on file  Intimate Partner Violence: Not on file    FAMILY HISTORY Family History  Problem Relation Age of Onset   Hypertension Mother    Hypertension Father    Alzheimer's disease Father    Prostate cancer Father    Hypertension Maternal Grandfather    Seizures Other    Colon cancer Neg Hx    Stomach cancer Neg Hx    Rectal cancer Neg Hx    Esophageal cancer Neg Hx     ALLERGIES:  has no known allergies.  MEDICATIONS:  Current Outpatient Medications  Medication Sig Dispense Refill   aluminum-magnesium hydroxide 200-200 MG/5ML suspension Take 15 mLs by mouth every 6 (six) hours as needed for indigestion.     buPROPion (WELLBUTRIN XL) 150 MG 24 hr tablet Take 150 mg by mouth every evening.     clobetasol cream (TEMOVATE) 0.05 % Apply 1 Application topically as needed.     clotrimazole-betamethasone (LOTRISONE) cream Apply 1 Application topically 2 (two) times daily.     divalproex (DEPAKOTE) 250 MG DR tablet Take 250 mg by mouth 2 (two) times daily.     DULoxetine (CYMBALTA) 60 MG capsule Take 60 mg by mouth at bedtime.      esomeprazole (NEXIUM) 40 MG capsule Take 40 mg by mouth daily. Pt states has been on for years     loperamide (IMODIUM) 2 MG capsule Take by mouth.     Multiple Vitamin (MULTIVITAMIN WITH MINERALS) TABS tablet Take 1 tablet by mouth daily.     nortriptyline (PAMELOR) 10 MG capsule Take 10 mg by mouth.     penicillin v potassium (VEETID) 500 MG tablet Take 1 tablet (500 mg total) by mouth 2 (two) times daily. 180 tablet 3   potassium chloride SA (KLOR-CON M) 20 MEQ tablet Take 3 tablet  today, then start 1 tablet twice daily 64 tablet 0   SUMAtriptan (IMITREX) 25 MG tablet Take 25 mg by mouth.     triamterene-hydrochlorothiazide (DYAZIDE) 37.5-25 MG capsule Take 1 capsule by mouth daily.     No current facility-administered medications for this visit.    PHYSICAL EXAMINATION:  ECOG PERFORMANCE STATUS: 1 - Symptomatic but completely ambulatory   There were no  vitals filed for this visit.      There were no vitals filed for this visit.       Physical Exam Vitals and nursing note reviewed.  Constitutional:      General: She is not in acute distress.    Appearance: She is obese. She is not ill-appearing, toxic-appearing or diaphoretic.     Comments: Here with husband.  More robust than last visit.  Wearing makeup for the first time in months  HENT:     Head: Normocephalic and atraumatic.     Right Ear: External ear normal.     Left Ear: External ear normal.     Nose: Nose normal. No congestion or rhinorrhea.     Mouth/Throat:     Mouth: Mucous membranes are dry.     Pharynx: No oropharyngeal exudate or posterior oropharyngeal erythema.  Eyes:     General: No scleral icterus.    Extraocular Movements: Extraocular movements intact.     Conjunctiva/sclera: Conjunctivae normal.     Pupils: Pupils are equal, round, and reactive to light.  Cardiovascular:     Rate and Rhythm: Normal rate.     Heart sounds: No murmur heard.    No friction rub. No gallop.  Abdominal:     General: Bowel sounds are normal.     Palpations: Abdomen is soft.     Tenderness: There is no abdominal tenderness. There is no guarding or rebound.  Musculoskeletal:        General: No swelling, tenderness or deformity.     Cervical back: Normal range of motion and neck supple. No rigidity or tenderness.     Comments: Bilateral LE edema continues to improve.      Lymphadenopathy:     Head:     Right side of head: No submental, submandibular, tonsillar, preauricular, posterior  auricular or occipital adenopathy.     Left side of head: No submental, submandibular, tonsillar, preauricular, posterior auricular or occipital adenopathy.     Cervical: No cervical adenopathy.     Right cervical: No superficial, deep or posterior cervical adenopathy.    Left cervical: No superficial, deep or posterior cervical adenopathy.     Upper Body:     Right upper body: No supraclavicular, axillary, pectoral or epitrochlear adenopathy.     Left upper body: No supraclavicular, axillary, pectoral or epitrochlear adenopathy.  Skin:    General: Skin is warm and dry.     Coloration: Skin is not jaundiced.     Comments: Ecchymoses pretibial regions bilateral  Neurological:     General: No focal deficit present.     Mental Status: She is alert and oriented to person, place, and time.     Cranial Nerves: No cranial nerve deficit.     Comments: Seated in chair.  Much more interactive and conversant.      Psychiatric:     Comments: Forgetful.  Talkative.  Insight not good     LABORATORY DATA: I have personally reviewed the data as listed:  Infusion on 10/10/2023  Component Date Value Ref Range Status   WBC Count 10/10/2023 4.0  4.0 - 10.5 K/uL Final   RBC 10/10/2023 3.75 (L)  3.87 - 5.11 MIL/uL Final   Hemoglobin 10/10/2023 12.0  12.0 - 15.0 g/dL Final   HCT 56/21/3086 33.5 (L)  36.0 - 46.0 % Final   MCV 10/10/2023 89.3  80.0 - 100.0 fL Final   MCH 10/10/2023 32.0  26.0 - 34.0 pg Final  MCHC 10/10/2023 35.8  30.0 - 36.0 g/dL Final   RDW 08/65/7846 11.9  11.5 - 15.5 % Final   Platelet Count 10/10/2023 93 (L)  150 - 400 K/uL Final   nRBC 10/10/2023 0.00  0.0 - 0.2 % Final   Neutrophils Relative % 10/10/2023 55  % Final   Neutro Abs 10/10/2023 2.3  1.7 - 7.7 K/uL Final   Lymphocytes Relative 10/10/2023 31  % Final   Lymphs Abs 10/10/2023 1.2  0.7 - 4.0 K/uL Final   Monocytes Relative 10/10/2023 10  % Final   Monocytes Absolute 10/10/2023 0.4  0.1 - 1.0 K/uL Final   Eosinophils  Relative 10/10/2023 2  % Final   Eosinophils Absolute 10/10/2023 0.1  0.0 - 0.5 K/uL Final   Basophils Relative 10/10/2023 1  % Final   Basophils Absolute 10/10/2023 0.0  0.0 - 0.1 K/uL Final   Immature Granulocytes 10/10/2023 1  % Final   Abs Immature Granulocytes 10/10/2023 0.03  0.00 - 0.07 K/uL Final   nRBC 10/10/2023 0  0 /100 WBC Final   Performed at [facility] at [facility], [address] [place], [place], [address]   Sodium 10/10/2023 141  135 - 145 mmol/L Final   Potassium 10/10/2023 3.7  3.5 - 5.1 mmol/L Final   Chloride 10/10/2023 106  98 - 111 mmol/L Final   CO2 10/10/2023 26  22 - 32 mmol/L Final   Glucose, Bld 10/10/2023 101 (H)  70 - 99 mg/dL Final   Glucose reference range applies only to samples taken after fasting for at least 8 hours.   BUN 10/10/2023 9  8 - 23 mg/dL Final   Creatinine 28/41/3244 0.71  0.44 - 1.00 mg/dL Final   Calcium 10/24/7251 9.6  8.9 - 10.3 mg/dL Final   Total Protein 66/44/0347 6.3 (L)  6.5 - 8.1 g/dL Final   Albumin 42/59/5638 4.1  3.5 - 5.0 g/dL Final   AST 75/64/3329 21  15 - 41 U/L Final   ALT 10/10/2023 11  0 - 44 U/L Final   Alkaline Phosphatase 10/10/2023 84  38 - 126 U/L Final   Total Bilirubin 10/10/2023 0.4  <1.2 mg/dL Final   GFR, Estimated 10/10/2023 >60  >60 mL/min Final   Comment: (NOTE) Calculated using the CKD-EPI Creatinine Equation (2021)    Anion gap 10/10/2023 9  5 - 15 Final   Performed at [facility] at [facility], [address] [place], [place], [address]   Ferritin 10/10/2023 159  11 - 307 ng/mL Final   Performed at [facility], [address], [place], [place] [address]   Retic Ct Pct 10/10/2023 1.7  0.4 - 3.1 % Final   RBC. 10/10/2023 3.75 (L)  3.87 - 5.11 MIL/uL Final   Retic Count, Absolute 10/10/2023 65.7  19.0 - 186.0 K/uL Final   Immature Retic Fract 10/10/2023 5.5  2.3 - 15.9 % Final   Performed at [facility] at [facility], [address], [place], [place], [address]   Haptoglobin 10/10/2023 49  37 - 355 mg/dL Final   Comment: (NOTE) Performed At: [facility] [address] [place], [place] [id number] Jolene Schimke MD DU:[id number]    LDH 10/10/2023 175  98 - 192 U/L Final   Performed at [facility] at [facility], [address] [place], [place], [address]    RADIOGRAPHIC STUDIES: I have personally reviewed the radiological images as listed and agree with the findings in the  report  No results found.  ASSESSMENT/PLAN  67 y.o. female is here because of  colon cancer.  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 Mass was partially circumferential (involves < 1/3rd of the lumen circumference). 10 mm polyp in proximal ascending colon which was sessile.  Pathology ulcerated mass- invasive moderately differentiated adenocarcinoma arising within a tubular adenoma with high-grade dysplasia   January 15 2023- CEA 2.9 Chromogranin A 564  January 17 2023- CT CAP  Short segment asymmetric wall thickening of the transverse colon measuring 3.3 cm in length.   Small adjacent soft tissue nodule/lymph nodes adjacent to the colonic wall thickening measuring 6 mm, suspicious  for local nodal disease. Prominent/mildly enlarged right upper quadrant lymph nodes similar to January 26, 2019 and favored reactive.  No distant metastatic disease.  Hepatomegaly with nodular hepatic contour, suggestive of cirrhosis Cholelithiasis without findings of acute cholecystitis.   Incidental right thyroid nodule   January 23 2023- Chromogranin A 294  February 04 2023:  Transverse  colectomy.  Pathology showed adenocarcinoma invading visceral peritoneum, Grade 3.  All of 30 lymph nodes negative for tumor.  MSI intact Feb 25 2023- On basis of spontaneous improvement in chromogranin A will hold on Dodatate PET.     Therapeutics-  Feb 25 2023- Reviewed [facility] treatment guidelines with patient and husband.  They are agreeable to proceed with adjuvant CAPOX x 6 months.   Mar 11 2023- Cycle 1 XelOx Mar 20 2023- Tolerated cycle 1 well with only some mild cold neuropathy April 01 2023- Cycle 2 XelOx   April 22 2023: Cycle 3 XelOx.              April 30 2023- Experiencing mucositis, anorexia, nausea and diarrhea.    May 13 2023: Cycle 4 XelOX.  Dose reduced Xeloda from 2000 mg bid to 1500 mg bid due to GI symptoms.  This will be her last cycle of adjuvant therapy  May 23 2023- Stopping Xeloda due to side effects and referring for admission.  Contacted ED attending  August 12 2023- Will need follow up CT and colonoscopy in the future   March 2025-will need CT and colonoscopy for surveillance    Chemotherapy induced nausea and diarrhea             April 30 2023-  Instructed patient to do the following-- Take Ondansetron 8 mg, four times daily.  Take Imodium 4 mg every 4 hrs for diarrhea Use the magic mouthwash every 4 hours while awake.  Arranged for patient to receive IVF and antiemetics.  To follow up in 2 days to assess progress     May 02 2023- To complete this course of Xeloda on July 15th.  Should improve with the week off between then and beginning of Cycle 4.  Anticipate dose reduction in Xeloda with Cycle 4.  Sx under better control today than at last visit due to better use of supportive care May 08 2023-  Continues to have 5 to 6 bowel movements daily.  Using imodium 2 mg q 5 hrs.  Instructed patient to use imodium 4 mg po q 4 hrs PRN diarrhea Adding lomotil up  to qid PRN.  Has required potassium replacement due to diarrheal losses May 21 2023- Continues to have significant  chemotherapy induced diarrhea.  Not using supportive care medications to maximum potential. Provided written instructions to patient and husband again today.  Adding Levaquin 500 mg daily.  Sending stool for C diff.  Referring to infusion for IVF.  Chemistries sent to guide electrolyte replacement.  Follow up in 2 days.   Sent testing to evaluate for DPD deficiency.  Discussed plans with clinical pharmacist.   May 23 2023- Diarrhea improved with use of scheduled imodium, lomotil, ondansetron and since starting Levaquin daily.  Remains dehydrated as judged by exam (mucosa and skin dry, tachycardic)  May 30 2023- Diarrhea persists. Began empiric Levaquin/Flagyl for diverticulitis/gastroenteritis  July 22, 2023--diarrhea resolved.  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  Reviewed medication list to remove non-essential agents; particularly those with CNS side effects  June 17 2023- Mental status improving.    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  Cognition continues to improve.  To see neurologist in October.  Encouraged consideration of tapering some of the psychotropic medications if possible. August 12 2023- Continues to improve.   September 09, 2023-has seen neurology  since last visit.  Mental status continues to improve albeit incrementally at this point.  Plans for neuropsych testing on hold.  Suspect again that most of the mental status issues were related to the microangiopathic hemolytic anemia October 21 2023- Memory continues to improve but still has some holes in her recollection which patient will fill in over time.  Provided encouragement.  Stressed importance of sleep.    Microangiopathic hemolytic anemia             May 25 2023- Haptoglobin < 30.  GI pathogen panel negative negative for Shiga toxin and bacteria known to produce Shiga Toxin              May 30 2023- Laboratory evaluation notable for Hgb 10.1 PLT 84, LDH 255.  DAT negative.  Haptoglobin < 10.  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  Arranging for treatment with Ultomiris.  Arranged for immunization against meningococcus and begin PCN prophylaxis June 05 2023-  Meningococcal vaccine June 06 2023- Ultomiris load June 10 2023- GI symptoms improving.  June 09 2023- Retic count improved to 2.6.  LDH and haptoglobin normal.  These changes along with improvement in GI Sx and mental status indicate response to treatment June 20 2023: Ultimiris  July 01, 2023-hemoglobin improved to 10.9.  Platelet count slightly improved at 77.  LDH improved at 140 albumin improved at 3.8.  All of these plus improvement in mental status and edema indicate overall response to therapy July 22 2023-Hemoglobin  essentially stable at 10.7.  MCV platelet count continues to improve August 12 2023- Hgb 10.6, LDH and PLT stable.  Anemia in part secondary to iron deficiency for which will arrange replacement.  Thinking continues to improve.  Anticipate 6 month course of Ultomiris followed by close observation with low threshold to restart August 15, 2023--Received Ultomiris September 09, 2023: Haptoglobin 66.  LDH normal.  Patient has mild thrombocytopenia without bleeding issues.  Hemoglobin improved at 11.4 in part due to administration of IV iron October 10 2023-- Received Ultimiris.  Haptoglobin 49  Hgb 12.0 PLT 93  Retic 1.7% Ferritin 159 LDH 175  December 04 2022- Ultimiris due.  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  At this point can consider either continuing indefinitely or stopping with close monitoring of labs.    We will continue to follow clinically and with labs.  Anticipate at least 104-month treatment with Ultomiris with plans to DC and follow carefully.  Explained to patient and husband she should continue 1 antibiotic prophylaxis for at least 6 months following discontinuation of therapy for atypical HUS.  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  At that time TTP and HUS were distinguishable only on clinical grounds and were thought of as being almost the same disease.  The entity of atypical HUS was not known at the time.  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  No evidence of inflammatory bowel disease by colonoscopy.    Chromogranin A 564  January 23 2023- Repeat Chromogranin A improved  Feb 25 2023- Diarrhea and chromogranin A improved therefore holding on Dotatate PET/CT    Poor venous access:     Mar 11 2023- Port placement   Headaches Mar 07 2023- Improved following surgery likely related to a component of general anesthesia.  To see neurology  Possible cirrhosis  January 17 2023- Nodular liver contour noted on CT CAP    Cancer Staging  Colon cancer [facility]) Staging form: Colon and Rectum, AJCC 8th Edition - Clinical stage from 02/25/2023: Stage IIB (cT4a, cN0, cM0) - Signed by Loni Muse, MD on 02/25/2023 Histopathologic type: Adenocarcinoma, NOS Stage prefix: Initial diagnosis Total positive nodes: 0 Total nodes examined: 30 Histologic grade (G): G3 Histologic grading system: 4 grade system    No problem-specific Assessment & Plan notes found for this encounter.    No orders of the defined types were placed in this encounter.   40  minutes was spent in patient care.  This included time spent preparing to see the patient (e.g., review of tests), obtaining and/or reviewing separately obtained history,  counseling and educating the patient/family/caregiver, ordering medications, tests, or procedures; documenting clinical information in the electronic or other health record, independently interpreting results and communicating results to the patient/family/caregiver as well as coordination of care.       All questions were answered. The patient knows to call the clinic with any problems, questions or concerns.  This note was electronically signed.    Loni Muse, MD  10/21/2023 8:34 AM

## 2023-10-22 ENCOUNTER — Other Ambulatory Visit: Payer: Self-pay

## 2023-11-04 DIAGNOSIS — G43909 Migraine, unspecified, not intractable, without status migrainosus: Secondary | ICD-10-CM | POA: Diagnosis not present

## 2023-11-22 ENCOUNTER — Encounter: Payer: Self-pay | Admitting: Oncology

## 2023-11-23 ENCOUNTER — Other Ambulatory Visit: Payer: Self-pay

## 2023-11-25 ENCOUNTER — Inpatient Hospital Stay: Payer: Medicare PPO

## 2023-11-25 ENCOUNTER — Inpatient Hospital Stay: Payer: Medicare PPO | Admitting: Oncology

## 2023-11-25 DIAGNOSIS — D5939 Other hemolytic-uremic syndrome: Secondary | ICD-10-CM

## 2023-11-25 DIAGNOSIS — R0781 Pleurodynia: Secondary | ICD-10-CM | POA: Diagnosis not present

## 2023-11-25 DIAGNOSIS — S8001XA Contusion of right knee, initial encounter: Secondary | ICD-10-CM | POA: Diagnosis not present

## 2023-11-25 DIAGNOSIS — M311 Thrombotic microangiopathy, unspecified: Secondary | ICD-10-CM

## 2023-11-27 ENCOUNTER — Other Ambulatory Visit: Payer: Self-pay

## 2023-11-27 ENCOUNTER — Encounter: Payer: Self-pay | Admitting: Oncology

## 2023-11-28 DIAGNOSIS — M25561 Pain in right knee: Secondary | ICD-10-CM | POA: Diagnosis not present

## 2023-11-28 DIAGNOSIS — M1711 Unilateral primary osteoarthritis, right knee: Secondary | ICD-10-CM | POA: Diagnosis not present

## 2023-11-29 DIAGNOSIS — S83241A Other tear of medial meniscus, current injury, right knee, initial encounter: Secondary | ICD-10-CM | POA: Diagnosis not present

## 2023-11-29 DIAGNOSIS — S83511A Sprain of anterior cruciate ligament of right knee, initial encounter: Secondary | ICD-10-CM | POA: Diagnosis not present

## 2023-11-29 DIAGNOSIS — S83411A Sprain of medial collateral ligament of right knee, initial encounter: Secondary | ICD-10-CM | POA: Diagnosis not present

## 2023-11-29 DIAGNOSIS — X58XXXA Exposure to other specified factors, initial encounter: Secondary | ICD-10-CM | POA: Diagnosis not present

## 2023-12-04 ENCOUNTER — Other Ambulatory Visit: Payer: Self-pay | Admitting: Oncology

## 2023-12-04 DIAGNOSIS — S83241A Other tear of medial meniscus, current injury, right knee, initial encounter: Secondary | ICD-10-CM | POA: Diagnosis not present

## 2023-12-04 DIAGNOSIS — M311 Thrombotic microangiopathy, unspecified: Secondary | ICD-10-CM

## 2023-12-04 DIAGNOSIS — M1711 Unilateral primary osteoarthritis, right knee: Secondary | ICD-10-CM | POA: Diagnosis not present

## 2023-12-04 DIAGNOSIS — C184 Malignant neoplasm of transverse colon: Secondary | ICD-10-CM

## 2023-12-04 NOTE — Progress Notes (Unsigned)
[facility]  [address] [place],  [place]  [address] [phone]  Clinic Day:  12/05/2023  Referring physician: Hurshel Party, NP   HISTORY OF PRESENT ILLNESS:  The patient is a 68 y.o. 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  She received 4 of 6 planned cycles of Xeloda.  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  She comes in today to be evaluated before heading into her 5th cycle of therapy.  Overall, she claims to be doing well.  Her thinking is mildly off, per her husband, but it has gotten better over these past months.  Of note, the patient carries a diagnosis of TTP, which required exchange plasmapheresis and steroids back in 1989.  PHYSICAL EXAM:  Blood pressure 138/81, pulse 89, temperature 97.6 F (36.4 C), temperature source Oral, resp. rate 14, height 5' 5.9" (1.674 m), weight 222 lb 11.2 oz (101 kg), last menstrual period 04/05/2011, SpO2 99%. Wt Readings from Last 3 Encounters:  12/05/23 222 lb 11.2 oz (101 kg)  10/21/23 217 lb 1.6 oz (98.5 kg)  10/10/23 212 lb (96.2 kg)   Body mass index is 36.05 kg/m. Performance status (ECOG): 1 - Symptomatic but completely ambulatory Physical Exam Constitutional:      Appearance: Normal appearance. She is not ill-appearing.  HENT:     Mouth/Throat:     Mouth: Mucous membranes are moist.     Pharynx: Oropharynx is clear. No oropharyngeal exudate or posterior oropharyngeal erythema.  Cardiovascular:     Rate and Rhythm: Normal rate and regular rhythm.     Heart sounds: No murmur heard.    No friction rub. No gallop.  Pulmonary:     Effort: Pulmonary effort is normal. No respiratory distress.     Breath sounds: Normal breath sounds. No wheezing, rhonchi or rales.  Abdominal:     General: Bowel sounds are normal. There is no distension.     Palpations: Abdomen is  soft. There is no mass.     Tenderness: There is no abdominal tenderness.  Musculoskeletal:        General: No swelling.     Right lower leg: No edema.     Left lower leg: No edema.  Lymphadenopathy:     Cervical: No cervical adenopathy.     Upper Body:     Right upper body: No supraclavicular or axillary adenopathy.     Left upper body: No supraclavicular or axillary adenopathy.     Lower Body: No right inguinal adenopathy. No left inguinal adenopathy.  Skin:    General: Skin is warm.     Coloration: Skin is not jaundiced.     Findings: No lesion or rash.  Neurological:     General: No focal deficit present.     Mental Status: She is alert and oriented to person, place, and time. Mental status is at baseline.  Psychiatric:        Mood and Affect: Mood normal.        Behavior: Behavior normal.        Thought Content: Thought content normal.    LABS:      Latest Ref Rng & Units 12/05/2023   11:43 AM 10/10/2023    9:49 AM 09/09/2023    9:33 AM  CBC  WBC 4.0 - 10.5 K/uL 3.6  4.0  4.4   Hemoglobin  12.0 - 15.0 g/dL 29.5  62.1  30.8   Hematocrit 36.0 - 46.0 % 35.3  33.5  32.3   Platelets 150 - 400 K/uL 114  93  104       Latest Ref Rng & Units 12/05/2023   11:43 AM 10/10/2023    9:49 AM 09/09/2023    9:33 AM  CMP  Glucose 70 - 99 mg/dL 92  657  846   BUN 8 - 23 mg/dL 13  9  13    Creatinine 0.44 - 1.00 mg/dL 9.62  9.52  8.41   Sodium 135 - 145 mmol/L 140  141  140   Potassium 3.5 - 5.1 mmol/L 4.1  3.7  4.4   Chloride 98 - 111 mmol/L 105  106  106   CO2 22 - 32 mmol/L 25  26  24    Calcium 8.9 - 10.3 mg/dL 9.3  9.6  9.3   Total Protein 6.5 - 8.1 g/dL 6.4  6.3  6.3   Total Bilirubin 0.0 - 1.2 mg/dL 0.5  0.4  0.3   Alkaline Phos 38 - 126 U/L 111  84  101   AST 15 - 41 U/L 23  21  23    ALT 0 - 44 U/L 18  11  15     LDH pending   ASSESSMENT & PLAN:  Assessment/Plan:  A 68 y.o. female with stage IIB colon cancer, stage IIB (T4a N0 M0) colon cancer, status post a transverse  colectomy in April 2024.  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  Clinically, she appears to be doing well.  I will see her back in 8 weeks to reassess her periphal counts.  If they continue to improve, I will see how well they do with discontinuation of her ravulizumab therapy.  Moving forward, I will also follow her CEA level over time to ensure there are no signs of disease recurrence.  The patient understands all the plans discussed today and is in agreement with them.    Siniyah Evangelist Kirby Funk, MD

## 2023-12-04 NOTE — Progress Notes (Unsigned)
 [facility] Cancer Follow up Visit:  Patient Care Team: Hurshel Party, NP as PCP - General (Internal Medicine) Loni Muse, MD (Inactive) as Consulting Physician (Internal Medicine)  CHIEF COMPLAINTS/PURPOSE OF CONSULTATION:  Oncology History  Colon cancer [facility])  01/15/2023 Initial Diagnosis   Colon cancer (HCC)   02/25/2023 Cancer Staging   Staging form: Colon and Rectum, AJCC 8th Edition - Clinical stage from 02/25/2023: Stage IIB (cT4a, cN0, cM0) - Signed by Loni Muse, MD on 02/25/2023 Histopathologic type: Adenocarcinoma, NOS Stage prefix: Initial diagnosis Total positive nodes: 0 Total nodes examined: 30 Histologic grade (G): G3 Histologic grading system: 4 grade system   03/11/2023 - 05/13/2023 Chemotherapy   Patient is on Treatment Plan : COLORECTAL Xelox (Capeox)(130/850) q21d       HISTORY OF PRESENTING ILLNESS: [name] 68 y.o. female is here because of  colon cancer Medical history notable for septic arthritis of the hip, obesity, atypical chest pain, skin cancer, carpal tunnel syndrome, chronic vaginitis, chronic venous and sufficiency, TTP, syncope  January 10, 2023: Colonoscopy-ulcerated 2 cm nonobstructing small mass with heaped up margins found in distal transverse colon.  Mass was partially circumferential (involves less than one third of the lumen circumference) no bleeding present.  10 mm polyp in proximal ascending colon which was sessile.  Pathology-Ascending colon polyp was compatible with a sessile serrated adenoma without cytologic dysplasia Transverse colon polyp-invasive moderately differentiated adenocarcinoma arising within a tubular adenoma with high-grade dysplasia  January 15 2023: [facility] Medical Oncology Consult Patient reports that the colonoscopy was performed for surveillance; however, she was also having episodes of intermittent diarrhea x 2 to 3 months.  Stools were not bloody, nor was she passing mucus but they  were soft.  In association with these episodes she would also feel dizzy and note an odd smell, nausea.   To see Dr. Logan Bores from surgery on January 22 2023   Social:  Married.  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  Tobacco quit 24 years.  Rare glass of wine  [facility] Mother alive 33 epileptic, dementia Father died 54 Alzheimers, prostate cancer Brother alive 69 arthritis requiring joint replacement,   WBC 6.4 hemoglobin 12.5 MCV 88 platelet count 178 normal differential CMP normal CEA 2.9 chromogranin A 564  January 17 2023:  CT CAP Short segment asymmetric wall thickening of the transverse colon measuring 3.3 cm in length, compatible with reported history of  primary colonic neoplasm.  Small adjacent soft tissue nodule/lymph nodes adjacent to the colonic wall thickening measuring 6 mm, nonspecific are suspicious  for local nodal disease. Prominent/mildly enlarged right upper quadrant lymph nodes are similar dating back to January 26, 2019 and favored reactive.  No convincing evidence of distant metastatic disease within the chest, abdomen or pelvis.  Hepatomegaly with nodular hepatic contour, suggestive of cirrhosis Cholelithiasis without findings of acute cholecystitis.   Incidental right thyroid nodule measuring   January 18 2023:  Presented to [facility] ED with headaches and chest discomfort.  CT PA negative for PE.  CT head without contrast negative.    January 22 2023:  Scheduled follow up for colon cancer.  Reviewed results of labs and imaging with patient and husband. Will repeat chromgranin A and refer for PET/CT if chromogranin A still significantly elevated.    January 23, 2023: Chromogranin A 294  February 04 2023:  Transverse colectomy Pathology showed adenocarcinoma invading visceral peritoneum, Grade 3.  All of 30 lymph nodes were negative for  tumor.  Surgical Stage (T4a, N0 M0)  MSI intact  Feb 25, 2023:   Reviewed results of surgical pathology with patient and husband.  Per NCCN  guidelines adjuvant options are CAPEOX, FOLFOX 6 months, Capecitabine or observation.  Discussed risks and benefits of each  Mar 07 2023:   Bifrontal headaches have returned.  These began in October 2023 but resolved after her colon surgery.  Has seen neurology and underwent MRI brain negative for CNS lesion.  EEG normal.   (Raises the question of what medications in perioperative period helped with the HA's.)  Recovering from a dog bite on dorsum of right hand.  Will receive Capecitabine today.    Mar 11 2023:  Port placement.  Cycle 1 XelOx   Mar 19 2023:  Neurology follow up.    Mar 20 2023:   Agree with Neurology assessment that neurocognitive testing should not be performed it at all, until well after chemotherapy has been completed.  Experienced some mild nausea helped by antiemetics.   Has occasional HA's.  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   No HFS.     April 01 2023:  Cycle 2 XelOx  April 17 2023:  Has lost 4 lbs.  Appetite diminished due to dysgeusia.  No metallic taste.  Edges of tongue get red and sore and painful.  Mouth feels dry despite drinking a lot of water and tea.  Not using anything for her mouth sores.   Fatigue increased.  Has cold neuropathy and cold induced pharyngospasm.      Begin Magic mouthwash for mucositis.     April 22 2023:  Cycle 3 XelOx.    April 30 2023:    Patient called office yesterday stating that she was anorectic, nauseated, having mouth sores and diarrhea.  Has lost 13 lbs.  Has difficulty remembering details which complicates history taking.  States that anorexia began after starting this round of chemotherapy.  "Food does not get past my tongue"; did not recognize this was nausea so did not begin taking antiemetics until 3 days ago.  Only taking antiemetics bid.  Mouth feels dry but not painful.  No emesis.  Began with diarrhea in the form of 5 to 6 stools per day but does not believe that she is taking  imodium for it.  Dysgeusia even to water.   Not using magic mouthwash regularly  May 02 2023:    Diarrhea improved with imodium.  Using ondansetron alternating with compazine; no emesis with improvement in nausea.  Eating limited by mucositis which she can't say is helped by magic mouthwash.  Not eating cold foods.  Has gained 2 lbs in the last two days.  To complete the course of Xeloda on May 06 2023.    WBC 4.7 hemoglobin 11.6 platelet count 61; 60 segs 23 lymphs 13 monos 4 eos CMP notable for potassium 3.4 glucose 103  May 08 2023:   Scheduled follow-up regarding colon cancer. Has lost 4 lbs since last visit owing to anorexia.   No mouth sores but has dysgeusia and mouth feels dry.  Using magic mouthwash.  Nauseated but without emesis.  Continues to have 5 to 6 bowel movements daily.  Using imodium 2 mg q 5 hrs.    Instructed patient to use imodium 4 mg po q 4 hrs PRN diarrhea Adding lomotil up to qid PRN Xeloda has not been shipped to patient.   Will dose reduce Xeloda from 2000 mg  bid to 1500 mg bid.    May 13 2023:  Cycle 4 XelOX.  Received total of 1 liter IVF during that visit due to hypotension, potassium due to hypokalemia.  Patient was told to increase dose of supplemental potassium  May 21 2023:  Seen as a work in due to side effects from chemotherapy.  Has lost 11 lbs since last visit.  Has been experiencing liquid stools sometimes almost hourly.  No nausea, emesis, mouth sores. Abdominal cramping. No fevers.  Appetite decreased.   Taking imodium 2 mg bid.  Not certain if she is taking lomotil.   Received IVF, IV potassium in infusion center WBC 3.8 hemoglobin 10.4 platelet count 50 CMP notable for potassium 3.4 AST 38 albumin 3.8 magnesium 1.7 DPD 5-fluorouracil toxicity panel sent  May 22 2023:  Fell backwards on steps outside.  Struck right elbow and knee but not head.  Skin tear to right elbow.   May 23, 2023: Scheduled follow-up to monitor side effects from  chemotherapy.  Anorectic but eating some.  Has gained 1 lbs.  Today experienced flash HA and accompanied by chest pain which last 2 to 4 minutes.  Yesterday had three diarrheal stools.  Significant improvement since taking Ondansetron, Lomotil and Imodium on scheduled basis.  Spending most of time sleeping.  Lost balance twice today.  Will stop Xeloda and arrange for hospitalization.    May 30 2023:  Post hospital follow up.  Has gained 5 lbs due to edema acquired during hospitalization.  Did not bring walker today but had one yesterday. Still having watery four to five times daily.  Experiencing LLQ abdominal pain.  Anorectic.  Using imodium twice daily.  Husband not certain how many times she is using lomotil daily.  May be using ondansetron bid.  Memory not good but may be improving slightly.  Will start Levaquin/Flagyl for possible diverticulitis.    June 05 2023:  Meningococcal vaccine injection June 06 2023:  Ultimiris   June 10 2023:    Diarrhea has resolved.  Appetite has improved.  Memory still an issue.  Accidentally took mother's Keppra and Gabapentin over the weekend which were out on the table which resulted in confusion and worsening of balance.  Yesterday while coming into the house fell backwards; no LOC, no head or neck pain.  Husband called poison control which recommended conservative management.    Reviewed mediations and struck off medications not being used.  Will start dyazide daily for edema  June 12 2023: WBC 3.3 hemoglobin 10.1 MCV 105 platelet count 70.  Reticulocyte count 3.9% haptoglobin 27 LDH 182 Alb 3.4 Cr 0.97  June 17, 2023:  Has lost 9 pounds since last visit due to diuresis.  Eating one meal per day.  Appetite a bit improved.  Memory appears to be improving.  Still having gait issues; needs to ambulate with a walker to prevent falls.  Had a fall outside her home while picking figs and did not have a walker.   Reviewed results of labs with patient and  husband.  Taking PCN twice daily   WBC 3.4 hemoglobin 10.1 MCV 104 platelet count 74.  Reticulocyte count 2.6 haptoglobin 60  LDH 159 Creatinine 1.26 albumin 3.6  June 20 2023:  Ultimiris  July 01 2023:   No weight change.  Had some diarrhea but did not use anything for it.  No falls since last visit.  Appetite better.  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  Compliant with prophylactic  PCN.  LE edema improved.  Anticipate 6 months of therapy  WBC 4.0 hemoglobin 10.9 MCV 103 platelet count 77.  Reticulocyte count 1.4% LDH 148.  Haptoglobin pending CMP notable for glucose 112 creatinine 0.9 albumin 3.8  July 22 2023:  .  Feeling well.  Cognition continues to improve.  To see Neurologist in October.   Would encourage consideration for tapering some of the psychotropic medications.    WBC 4.1 hemoglobin 10.7 MCV 99.7 platelet count 109  LDH 142 Chemistry is notable for glucose 104  August 12 2023:    Has gained 5 lbs since last visit.  Taking care of mother who has memory issues.  No abdominal pain.  To see Neurology on September 04 2023.   No changes in psychotropic medications since last visit.   Thinking clear.  No HA's since last visit.     WBC 3.9 hemoglobin 10.6 platelet count 101 LDH 145 ferritin 38 B12 494 ferritin 10.7 reticulocyte count 1.8%  August 15 2023:  Ultimiris  August 29 2023- through September 09 2023: Received a total of 1000 mg Venofer   September 09 2023:  Scheduled follow up for MAHA and colon cancer.  Feels well.  Saw neurology since last visit, no changes and held on neuropsych testing.  Still has some memory issues.   Remains on prophylactic antibiotics.    WBC 4.4 hemoglobin 11.4 MCV 92 platelet count 104 differential normal LDH 156 ferritin 238 CMP notable for glucose of 100  October 10 2023: Ultimiris WBC 4.0 Hgb 12.0 PLT 93  Retic 1.7% Ferritin 159 LDH 175  October 21 2023:  Scheduled follow up for MAHA and colon cancer.   Recovering from URI.  Fatigued but attributes that to poor sleep because mother who has dementia is with altered sleep pattern.  Still has some memory issues but overall this has improved.  Remains on PCN for prophylaxis  December 04 2022:  Ultimiris  March 2025:  Will need follow up colonoscopy and CT    Review of Systems  Constitutional:  Negative for appetite change, chills, fatigue, fever and unexpected weight change.  HENT:   Negative for mouth sores, nosebleeds, sore throat, tinnitus and trouble swallowing.   Eyes:  Negative for eye problems and icterus.       Vision changes:  None  Respiratory:  Negative for chest tightness, hemoptysis, shortness of breath and wheezing.        One month of nonproductive cough.  No history of seasonable allergies  Cardiovascular:  Negative for chest pain and leg swelling.  Gastrointestinal:  Negative for constipation, diarrhea, nausea, rectal pain and vomiting.  Endocrine: Negative for hot flashes.       Cold intolerance:  none Heat intolerance:  none  Genitourinary:  Negative for bladder incontinence, difficulty urinating, dysuria, frequency, hematuria and nocturia.   Musculoskeletal:  Negative for arthralgias, back pain, gait problem, myalgias, neck pain and neck stiffness.  Skin:  Negative for itching, rash and wound.  Neurological:  Negative for dizziness, extremity weakness, gait problem, headaches, numbness, seizures and speech difficulty.  Hematological:  Negative for adenopathy. Does not bruise/bleed easily.  Psychiatric/Behavioral:  Negative for confusion, sleep disturbance and suicidal ideas. The patient is not nervous/anxious.     MEDICAL HISTORY: Past Medical History:  Diagnosis Date   Abnormal glucose    Achilles tendinitis of left lower extremity    Acute suppurative arthritis due to bacteria (HCC) 03/09/2011   Overview:  Last Assessment & Plan:  Methicillin  sensitive staphylococcal aureus septic hip. She clearly does need I&D of  the hip. I will have her continue the doxycycline for now but stop it 7 days prior to her surgery to maximize the yield on the cultures in the operating room though I be shocked if we don't find methicillin sensitive staph aureus again.. I agree with removing as much of the pros   Anal fissure 11/30/2015   Arthritis    right hip   Atypical chest pain 04/09/2016   Bilateral edema of lower extremity    BMI 39.0-39.9,adult    Cancer [facility])    SKIN CANCER   Carpal tunnel syndrome 03/09/2011   Overview:  Last Assessment & Plan:  Clinically this seems to be carpal tunnel syndrome. Giving given her history of infection though we can keep in the back of our mind the idea that she will have a cervical spine problem but I think this is unlikely at this point in time medics carpal tunnel syndrome fits clinically this was a positive Tinel's sign however in for a brace for her.   Chronic vaginitis    Chronic venous insufficiency    Depression    Dyslipidemia    Fatigue 10/13/2015   Fibromyalgia    GERD without esophagitis    History of immune thrombocytopenia 03/09/2011   History of operative procedure on hip 03/10/2012   History of TTP (thrombotic thrombocytopenic purpura)    Hypokalemia    Lichen sclerosus et atrophicus of the vulva 11/30/2015   Major depressive disorder, recurrent, moderate (HCC)    Methicillin susceptible Staphylococcus aureus infection 03/09/2011   Overview:  Last Assessment & Plan:  This is undoubtedlythe culprit organism   Migraine    Morbid obesity with BMI of 45.0-49.9, adult (HCC) 10/13/2015   Morbidly obese (HCC)    MSSA (methicillin susceptible Staphylococcus aureus) infection    Near syncope    Osteoarthritis, chronic    Pain due to total hip replacement (HCC) 09/20/2011   Palpitations 03/04/2019   Postmenopausal    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 syndrome (HCC)     20 yrs ago-not seen anyone for 10 yrs.   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 Mild diverticulosis. Small internal hemorrhoids. Healed anal fissure   ENDOVENOUS ABLATION SAPHENOUS VEIN W/ LASER Right 04/02/2019   endovenous laser ablation right greater saphenous vein by Waverly Ferrari MD    JOINT REPLACEMENT  08/2009   right hip replacement   REVISION TOTAL HIP ARTHROPLASTY     TONSILLECTOMY  1963   TOTAL HIP REVISION  10/08/2011   Procedure: TOTAL HIP REVISION;  Surgeon: [name];  Location: [facility];  Service: Orthopedics;  Laterality: Right;  Resection of Right Total Hip/Extended Trochanteric Osteotomy/Placement of Antibiotic Total Hip Cemented by Depuy   TOTAL HIP REVISION  03/10/2012   Procedure: TOTAL HIP REVISION;  Surgeon: [name], MD;  Location: [facility];  Service: Orthopedics;  Laterality: Right;  Reimplantation/Revision of a Right Total Hip and Removal of Cemented Implant    SOCIAL HISTORY: Social History   Socioeconomic History   Marital status: Married    Spouse name: Raiford Noble   Number of children: Not on file   Years of education: 12+7   Highest education level: Bachelor's degree (e.g.,  BA, AB, BS)  Occupational History   Occupation: Magazine features editor: [employer]  Tobacco Use   Smoking status: Former    Current packs/day: 0.00    Average packs/day: 1.5 packs/day for 20.0 years (30.0 ttl pk-yrs)    Types: Cigarettes    Start date: 10/04/1970    Quit date: 10/04/1990    Years since quitting: 33.1   Smokeless tobacco: Former    Quit date: 03/09/1991  Vaping Use   Vaping status: Never Used  Substance and Sexual Activity   Alcohol use: Yes    Comment: OCCASIONAL   Drug use: No   Sexual activity: Yes    Partners: Male  Other Topics Concern   Not on file  Social History Narrative   Not on file   Social  Drivers of Health   Financial Resource Strain: Not on file  Food Insecurity: Low Risk  (02/04/2023)   [facility]   Hunger Vital Sign    Worried About Running Out of Food in the Last Year: Never true    Ran Out of Food in the Last Year: Never true  Transportation Needs: Not on file (02/04/2023)  Physical Activity: Not on file  Stress: Not on file  Social Connections: Not on file  Intimate Partner Violence: Not on file    FAMILY HISTORY Family History  Problem Relation Age of Onset   Hypertension Mother    Hypertension Father    Alzheimer's disease Father    Prostate cancer Father    Hypertension Maternal Grandfather    Seizures Other    Colon cancer Neg Hx    Stomach cancer Neg Hx    Rectal cancer Neg Hx    Esophageal cancer Neg Hx     ALLERGIES:  has no known allergies.  MEDICATIONS:  Current Outpatient Medications  Medication Sig Dispense Refill   aluminum-magnesium hydroxide 200-200 MG/5ML suspension Take 15 mLs by mouth every 6 (six) hours as needed for indigestion.     buPROPion (WELLBUTRIN XL) 150 MG 24 hr tablet Take 150 mg by mouth every evening.     clobetasol cream (TEMOVATE) 0.05 % Apply 1 Application topically as needed.     clotrimazole-betamethasone (LOTRISONE) cream Apply 1 Application topically 2 (two) times daily.     divalproex (DEPAKOTE) 250 MG DR tablet Take 250 mg by mouth 2 (two) times daily.     DULoxetine (CYMBALTA) 60 MG capsule Take 60 mg by mouth at bedtime.      esomeprazole (NEXIUM) 40 MG capsule Take 40 mg by mouth daily. Pt states has been on for years     loperamide (IMODIUM) 2 MG capsule Take by mouth.     Multiple Vitamin (MULTIVITAMIN WITH MINERALS) TABS tablet Take 1 tablet by mouth daily.     nortriptyline (PAMELOR) 10 MG capsule Take 10 mg by mouth.     penicillin v potassium (VEETID) 500 MG tablet Take 1 tablet (500 mg total) by mouth 2 (two) times daily. 180 tablet 3   potassium chloride SA (KLOR-CON M)  20 MEQ tablet Take 3 tablet today, then start 1 tablet twice daily 64 tablet 0   SUMAtriptan (IMITREX) 25 MG tablet Take 25 mg by mouth.     triamterene-hydrochlorothiazide (DYAZIDE) 37.5-25 MG capsule Take 1 capsule by mouth daily.     No current facility-administered medications for this visit.    PHYSICAL EXAMINATION:  ECOG PERFORMANCE STATUS: 1 - Symptomatic but completely ambulatory   There  were no vitals filed for this visit.      There were no vitals filed for this visit.       Physical Exam Vitals and nursing note reviewed.  Constitutional:      General: She is not in acute distress.    Appearance: She is obese. She is not ill-appearing, toxic-appearing or diaphoretic.     Comments: Here with husband.  More robust than last visit.  Wearing makeup for the first time in months  HENT:     Head: Normocephalic and atraumatic.     Right Ear: External ear normal.     Left Ear: External ear normal.     Nose: Nose normal. No congestion or rhinorrhea.     Mouth/Throat:     Mouth: Mucous membranes are dry.     Pharynx: No oropharyngeal exudate or posterior oropharyngeal erythema.  Eyes:     General: No scleral icterus.    Extraocular Movements: Extraocular movements intact.     Conjunctiva/sclera: Conjunctivae normal.     Pupils: Pupils are equal, round, and reactive to light.  Cardiovascular:     Rate and Rhythm: Normal rate.     Heart sounds: No murmur heard.    No friction rub. No gallop.  Abdominal:     General: Bowel sounds are normal.     Palpations: Abdomen is soft.     Tenderness: There is no abdominal tenderness. There is no guarding or rebound.  Musculoskeletal:        General: No swelling, tenderness or deformity.     Cervical back: Normal range of motion and neck supple. No rigidity or tenderness.     Comments: Bilateral LE edema continues to improve.      Lymphadenopathy:     Head:     Right side of head: No submental, submandibular, tonsillar,  preauricular, posterior auricular or occipital adenopathy.     Left side of head: No submental, submandibular, tonsillar, preauricular, posterior auricular or occipital adenopathy.     Cervical: No cervical adenopathy.     Right cervical: No superficial, deep or posterior cervical adenopathy.    Left cervical: No superficial, deep or posterior cervical adenopathy.     Upper Body:     Right upper body: No supraclavicular, axillary, pectoral or epitrochlear adenopathy.     Left upper body: No supraclavicular, axillary, pectoral or epitrochlear adenopathy.  Skin:    General: Skin is warm and dry.     Coloration: Skin is not jaundiced.     Comments: Ecchymoses pretibial regions bilateral  Neurological:     General: No focal deficit present.     Mental Status: She is alert and oriented to person, place, and time.     Cranial Nerves: No cranial nerve deficit.     Comments: Seated in chair.  Much more interactive and conversant.      Psychiatric:     Comments: Forgetful.  Talkative.  Insight not good      LABORATORY DATA: I have personally reviewed the data as listed:  No visits with results within 1 Month(s) from this visit.  Latest known visit with results is:  Infusion on 10/10/2023  Component Date Value Ref Range Status   WBC Count 10/10/2023 4.0  4.0 - 10.5 K/uL Final   RBC 10/10/2023 3.75 (L)  3.87 - 5.11 MIL/uL Final   Hemoglobin 10/10/2023 12.0  12.0 - 15.0 g/dL Final   HCT 62/95/2841 33.5 (L)  36.0 - 46.0 % Final   MCV  10/10/2023 89.3  80.0 - 100.0 fL Final   MCH 10/10/2023 32.0  26.0 - 34.0 pg Final   MCHC 10/10/2023 35.8  30.0 - 36.0 g/dL Final   RDW 16/07/9603 11.9  11.5 - 15.5 % Final   Platelet Count 10/10/2023 93 (L)  150 - 400 K/uL Final   nRBC 10/10/2023 0.00  0.0 - 0.2 % Final   Neutrophils Relative % 10/10/2023 55  % Final   Neutro Abs 10/10/2023 2.3  1.7 - 7.7 K/uL Final   Lymphocytes Relative 10/10/2023 31  % Final   Lymphs Abs 10/10/2023 1.2  0.7 - 4.0 K/uL  Final   Monocytes Relative 10/10/2023 10  % Final   Monocytes Absolute 10/10/2023 0.4  0.1 - 1.0 K/uL Final   Eosinophils Relative 10/10/2023 2  % Final   Eosinophils Absolute 10/10/2023 0.1  0.0 - 0.5 K/uL Final   Basophils Relative 10/10/2023 1  % Final   Basophils Absolute 10/10/2023 0.0  0.0 - 0.1 K/uL Final   Immature Granulocytes 10/10/2023 1  % Final   Abs Immature Granulocytes 10/10/2023 0.03  0.00 - 0.07 K/uL Final   nRBC 10/10/2023 0  0 /100 WBC Final   Performed at [facility], [address] [place], [place], [address]   Sodium 10/10/2023 141  135 - 145 mmol/L Final   Potassium 10/10/2023 3.7  3.5 - 5.1 mmol/L Final   Chloride 10/10/2023 106  98 - 111 mmol/L Final   CO2 10/10/2023 26  22 - 32 mmol/L Final   Glucose, Bld 10/10/2023 101 (H)  70 - 99 mg/dL Final   Glucose reference range applies only to samples taken after fasting for at least 8 hours.   BUN 10/10/2023 9  8 - 23 mg/dL Final   Creatinine 11/91/4782 0.71  0.44 - 1.00 mg/dL Final   Calcium 95/62/1308 9.6  8.9 - 10.3 mg/dL Final   Total Protein 65/78/4696 6.3 (L)  6.5 - 8.1 g/dL Final   Albumin 29/52/8413 4.1  3.5 - 5.0 g/dL Final   AST 24/40/1027 21  15 - 41 U/L Final   ALT 10/10/2023 11  0 - 44 U/L Final   Alkaline Phosphatase 10/10/2023 84  38 - 126 U/L Final   Total Bilirubin 10/10/2023 0.4  <1.2 mg/dL Final   GFR, Estimated 10/10/2023 >60  >60 mL/min Final   Comment: (NOTE) Calculated using the CKD-EPI Creatinine Equation (2021)    Anion gap 10/10/2023 9  5 - 15 Final   Performed at [facility], [address], [place], [place], [address]   Ferritin 10/10/2023 159  11 - 307 ng/mL Final   Performed at [facility], [address], [place], [place] [address]   Retic Ct Pct 10/10/2023 1.7  0.4 - 3.1 % Final   RBC. 10/10/2023 3.75 (L)  3.87 - 5.11 MIL/uL Final   Retic Count, Absolute 10/10/2023 65.7  19.0 - 186.0 K/uL Final    Immature Retic Fract 10/10/2023 5.5  2.3 - 15.9 % Final   Performed at [facility], [address] [place], [place], [address]   Haptoglobin 10/10/2023 49  37 - 355 mg/dL Final   Comment: (NOTE) Performed At: [facility] [address] [place], [place] [id number] Jolene Schimke MD PI:[id number]    LDH 10/10/2023 175  98 - 192 U/L Final   Performed at [facility], [address] [place], [place], [address]  RADIOGRAPHIC STUDIES: I have personally reviewed the radiological images as listed and agree with the findings in the report  No results found.  ASSESSMENT/PLAN  68 y.o. female is here because of  colon cancer.  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 Mass was partially circumferential (involves < 1/3rd of the lumen circumference). 10 mm polyp in proximal ascending colon which was sessile.  Pathology ulcerated mass- invasive moderately differentiated adenocarcinoma arising within a tubular adenoma with high-grade dysplasia   January 15 2023- CEA 2.9 Chromogranin A 564  January 17 2023- CT CAP  Short segment asymmetric wall thickening of the transverse colon measuring 3.3 cm in length.   Small adjacent soft tissue nodule/lymph nodes adjacent to the colonic wall thickening measuring 6 mm, suspicious  for local nodal disease. Prominent/mildly enlarged right upper quadrant lymph nodes similar to January 26, 2019 and favored reactive.  No distant metastatic disease.  Hepatomegaly with nodular hepatic contour, suggestive of cirrhosis Cholelithiasis without  findings of acute cholecystitis.   Incidental right thyroid nodule   January 23 2023- Chromogranin A 294  February 04 2023:  Transverse colectomy.  Pathology showed adenocarcinoma invading visceral peritoneum, Grade 3.  All of 30 lymph nodes negative for tumor.  MSI intact Feb 25 2023- On basis of spontaneous improvement in chromogranin A will hold on Dodatate PET.     Therapeutics-  Feb 25 2023- Reviewed [facility] treatment guidelines with patient and husband.  They are agreeable to proceed with adjuvant CAPOX x 6 months.   Mar 11 2023- Cycle 1 XelOx Mar 20 2023- Tolerated cycle 1 well with only some mild cold neuropathy April 01 2023- Cycle 2 XelOx   April 22 2023: Cycle 3 XelOx.              April 30 2023- Experiencing mucositis, anorexia, nausea and diarrhea.    May 13 2023: Cycle 4 XelOX.  Dose reduced Xeloda from 2000 mg bid to 1500 mg bid due to GI symptoms.  This will be her last cycle of adjuvant therapy  May 23 2023- Stopping Xeloda due to side effects and referring for admission.  Contacted ED attending  August 12 2023- Will need follow up CT and colonoscopy in the future   March 2025-will need CT and colonoscopy for surveillance    Chemotherapy induced nausea and diarrhea             April 30 2023-  Instructed patient to do the following-- Take Ondansetron 8 mg, four times daily.  Take Imodium 4 mg every 4 hrs for diarrhea Use the magic mouthwash every 4 hours while awake.  Arranged for patient to receive IVF and antiemetics.  To follow up in 2 days to assess progress     May 02 2023- To complete this course of Xeloda on July 15th.  Should improve with the week off between then and beginning of Cycle 4.  Anticipate dose reduction in Xeloda with Cycle 4.  Sx under better control today than at last visit due to better use of supportive care May 08 2023-  Continues to have 5 to 6 bowel movements daily.  Using imodium 2 mg q 5  hrs.  Instructed patient to use imodium 4 mg po q 4 hrs PRN  diarrhea Adding lomotil up to qid PRN.  Has required potassium replacement due to diarrheal losses May 21 2023- Continues to have significant chemotherapy induced diarrhea.  Not using supportive care medications to maximum potential. Provided written instructions to patient and husband again today.  Adding Levaquin 500 mg daily.  Sending stool for C diff.  Referring to infusion for IVF.  Chemistries sent to guide electrolyte replacement.  Follow up in 2 days.   Sent testing to evaluate for DPD deficiency.  Discussed plans with clinical pharmacist.   May 23 2023- Diarrhea improved with use of scheduled imodium, lomotil, ondansetron and since starting Levaquin daily.  Remains dehydrated as judged by exam (mucosa and skin dry, tachycardic)  May 30 2023- Diarrhea persists. Began empiric Levaquin/Flagyl for diverticulitis/gastroenteritis  July 22, 2023--diarrhea resolved.  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  Reviewed medication list to remove non-essential agents; particularly those with CNS side effects  June 17 2023- Mental status improving.    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  Cognition continues to improve.  To see neurologist in October.  Encouraged consideration  of tapering some of the psychotropic medications if possible. August 12 2023- Continues to improve.   September 09, 2023-has seen neurology since last visit.  Mental status continues to improve albeit incrementally at this point.  Plans for neuropsych testing on hold.  Suspect again that most of the mental status issues were related to the microangiopathic hemolytic anemia October 21 2023- Memory continues to improve but still has some holes in her recollection which patient will fill in over time.  Provided encouragement.  Stressed importance of sleep.    Microangiopathic hemolytic anemia             May 25 2023- Haptoglobin < 30.  GI pathogen panel negative negative for Shiga toxin and bacteria known to produce Shiga Toxin              May 30 2023- Laboratory evaluation notable for Hgb 10.1 PLT 84, LDH 255.  DAT negative.  Haptoglobin < 10.  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  Arranging for treatment with Ultomiris.  Arranged for immunization against meningococcus and begin PCN prophylaxis June 05 2023- Meningococcal vaccine June 06 2023- Ultomiris load June 10 2023- GI symptoms improving.  June 09 2023- Retic count improved to 2.6.  LDH and haptoglobin normal.  These changes along with improvement in GI Sx and mental status indicate response to treatment June 20 2023: Ultimiris  July 01, 2023-hemoglobin improved to 10.9.  Platelet count slightly improved at 77.  LDH improved at 140 albumin  improved at 3.8.  All of these plus improvement in mental status and edema indicate overall response to therapy July 22 2023-Hemoglobin essentially stable at 10.7.  MCV platelet count continues to improve August 12 2023- Hgb 10.6, LDH and PLT stable.  Anemia in part secondary to iron deficiency for which will arrange replacement.  Thinking continues to improve.  Anticipate 6 month course of Ultomiris followed by close observation with low threshold to restart August 15, 2023--Received Ultomiris September 09, 2023: Haptoglobin 66.  LDH normal.  Patient has mild thrombocytopenia without bleeding issues.  Hemoglobin improved at 11.4 in part due to administration of IV iron October 10 2023-- Received Ultimiris.  Haptoglobin 49  Hgb 12.0 PLT 93  Retic 1.7% Ferritin 159 LDH 175  December 04 2022- Ultimiris due.  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  At this point can consider either continuing indefinitely or stopping with close monitoring of labs.    We will continue to follow clinically and with labs.  Anticipate at least 84-month treatment with Ultomiris with plans to DC and follow carefully.  Explained to patient and husband she should continue 1 antibiotic prophylaxis for at least 6 months following discontinuation of therapy for atypical HUS.  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  At that time TTP and HUS were distinguishable only on clinical grounds and were thought of as being almost the same disease.  The entity of atypical HUS was not known at the time.  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  No evidence of inflammatory bowel disease by colonoscopy.    Chromogranin A 564  January 23 2023- Repeat  Chromogranin A improved  Feb 25 2023- Diarrhea and chromogranin A improved therefore holding on Dotatate PET/CT    Poor venous access:    Mar 11 2023- Port placement   Headaches Mar 07 2023- Improved following surgery likely related to a component of general anesthesia.  To see neurology  Possible cirrhosis  January 17 2023- Nodular liver contour noted on CT CAP    Cancer Staging  Colon cancer [facility]) Staging form: Colon and Rectum, AJCC 8th Edition - Clinical stage from 02/25/2023: Stage IIB (cT4a, cN0, cM0) - Signed by Loni Muse, MD on 02/25/2023 Histopathologic type: Adenocarcinoma, NOS Stage prefix: Initial diagnosis Total positive nodes: 0 Total nodes examined: 30 Histologic grade (G): G3 Histologic grading system: 4 grade system    No problem-specific Assessment & Plan notes found for this encounter.    No orders of the defined types were placed in this encounter.   40  minutes was spent in patient care.  This  included time spent preparing to see the patient (e.g., review of tests), obtaining and/or reviewing separately obtained history, counseling and educating the patient/family/caregiver, ordering medications, tests, or procedures; documenting clinical information in the electronic or other health record, independently interpreting results and communicating results to the patient/family/caregiver as well as coordination of care.       All questions were answered. The patient knows to call the clinic with any problems, questions or concerns.  This note was electronically signed.    Weston Settle, MD  12/04/2023 6:29 PM

## 2023-12-05 ENCOUNTER — Other Ambulatory Visit: Payer: Self-pay

## 2023-12-05 ENCOUNTER — Other Ambulatory Visit: Payer: Self-pay | Admitting: Oncology

## 2023-12-05 ENCOUNTER — Encounter: Payer: Self-pay | Admitting: Oncology

## 2023-12-05 ENCOUNTER — Inpatient Hospital Stay: Payer: Medicare PPO | Admitting: Oncology

## 2023-12-05 ENCOUNTER — Inpatient Hospital Stay: Payer: Medicare PPO | Attending: Oncology

## 2023-12-05 ENCOUNTER — Inpatient Hospital Stay: Payer: Medicare PPO

## 2023-12-05 VITALS — BP 138/81 | HR 89 | Temp 97.6°F | Resp 14 | Ht 65.9 in | Wt 222.7 lb

## 2023-12-05 DIAGNOSIS — D5939 Other hemolytic-uremic syndrome: Secondary | ICD-10-CM

## 2023-12-05 DIAGNOSIS — D594 Other nonautoimmune hemolytic anemias: Secondary | ICD-10-CM | POA: Insufficient documentation

## 2023-12-05 DIAGNOSIS — M3119 Other thrombotic microangiopathy: Secondary | ICD-10-CM | POA: Diagnosis not present

## 2023-12-05 DIAGNOSIS — Z85038 Personal history of other malignant neoplasm of large intestine: Secondary | ICD-10-CM | POA: Diagnosis not present

## 2023-12-05 DIAGNOSIS — C184 Malignant neoplasm of transverse colon: Secondary | ICD-10-CM

## 2023-12-05 DIAGNOSIS — M311 Thrombotic microangiopathy, unspecified: Secondary | ICD-10-CM

## 2023-12-05 LAB — CMP (CANCER CENTER ONLY)
ALT: 18 U/L (ref 0–44)
AST: 23 U/L (ref 15–41)
Albumin: 4.1 g/dL (ref 3.5–5.0)
Alkaline Phosphatase: 111 U/L (ref 38–126)
Anion gap: 10 (ref 5–15)
BUN: 13 mg/dL (ref 8–23)
CO2: 25 mmol/L (ref 22–32)
Calcium: 9.3 mg/dL (ref 8.9–10.3)
Chloride: 105 mmol/L (ref 98–111)
Creatinine: 0.71 mg/dL (ref 0.44–1.00)
GFR, Estimated: >60 mL/min (ref >60)
Glucose, Bld: 92 mg/dL (ref 70–99)
Potassium: 4.1 mmol/L (ref 3.5–5.1)
Sodium: 140 mmol/L (ref 135–145)
Total Bilirubin: 0.5 mg/dL (ref 0.0–1.2)
Total Protein: 6.4 g/dL — ABNORMAL LOW (ref 6.5–8.1)

## 2023-12-05 LAB — CBC WITH DIFFERENTIAL (CANCER CENTER ONLY)
Abs Immature Granulocytes: 0.01 10*3/uL (ref 0.00–0.07)
Basophils Absolute: 0 10*3/uL (ref 0.0–0.1)
Basophils Relative: 0 %
Eosinophils Absolute: 0.1 10*3/uL (ref 0.0–0.5)
Eosinophils Relative: 2 %
HCT: 35.3 % — ABNORMAL LOW (ref 36.0–46.0)
Hemoglobin: 12.3 g/dL (ref 12.0–15.0)
Immature Granulocytes: 0 %
Immature Platelet Fraction: 0.4 % — ABNORMAL LOW (ref 1.2–8.6)
Lymphocytes Relative: 26 %
Lymphs Abs: 0.9 10*3/uL (ref 0.7–4.0)
MCH: 31.6 pg (ref 26.0–34.0)
MCHC: 34.8 g/dL (ref 30.0–36.0)
MCV: 90.7 fL (ref 80.0–100.0)
Monocytes Absolute: 0.4 10*3/uL (ref 0.1–1.0)
Monocytes Relative: 12 %
Neutro Abs: 2.2 10*3/uL (ref 1.7–7.7)
Neutrophils Relative %: 60 %
Platelet Count: 114 10*3/uL — ABNORMAL LOW (ref 150–400)
RBC: 3.89 MIL/uL (ref 3.87–5.11)
RDW: 12.1 % (ref 11.5–15.5)
WBC Count: 3.6 10*3/uL — ABNORMAL LOW (ref 4.0–10.5)
nRBC: 0 % (ref 0.0–0.2)
nRBC: 0 /100{WBCs}

## 2023-12-05 LAB — CEA (ACCESS): CEA (CHCC): 3.91 ng/mL (ref 0.00–5.00)

## 2023-12-05 LAB — LACTATE DEHYDROGENASE: LDH: 181 U/L (ref 98–192)

## 2023-12-05 MED ORDER — SODIUM CHLORIDE 0.9 % IV SOLN: Freq: Once | INTRAVENOUS | Status: AC

## 2023-12-05 MED ORDER — SODIUM CHLORIDE 0.9% FLUSH
10.0000 mL | INTRAVENOUS | Status: DC | PRN
  Administered 2023-12-05: 10 mL

## 2023-12-05 MED ORDER — HEPARIN SOD (PORK) LOCK FLUSH 100 UNIT/ML IV SOLN: 500.0000 [IU] | Freq: Once | INTRAVENOUS | Status: DC | PRN

## 2023-12-05 MED ORDER — RAVULIZUMAB-CWVZ 300 MG/3ML IV SOLN
3300.0000 mg | Freq: Once | INTRAVENOUS | Status: AC
  Administered 2023-12-05: 3300 mg via INTRAVENOUS
  Filled 2023-12-05: qty 33

## 2023-12-05 NOTE — Progress Notes (Signed)
Leave dose at 3300mg  despite weight gain per Dr. Melvyn Neth.  Will continue to monitor weight.

## 2023-12-05 NOTE — Patient Instructions (Signed)
Ravulizumab Injection What is this medication? RAVULIZUMAB (rav ue LIZ ue mab) treats certain blood conditions that can cause low levels of red blood cells (anemia) and blood clots, such as atypical hemolytic uremic syndrome (aHUS) and paroxysmal nocturnal hemoglobinuria (PNH). It works by slowing down an overactive immune system, which reduces the breakdown of red blood cells. It also prevents blood cells (platelets) from forming a clot. It may also be used to treat a condition that causes muscles to easily weaken or fatigue (myasthenia gravis). It can be used to treat neuromyelitis optica spectrum disorder (NMOSD), a condition that causes inflammation in the nerves of the eyes and spinal cord. It is a monoclonal antibody. This medicine may be used for other purposes; ask your health care provider or pharmacist if you have questions. COMMON BRAND NAME(S): ULTOMIRIS What should I tell my care team before I take this medication? They need to know if you have any of these conditions: Infection An unusual or allergic reaction to ravulizumab, other medications, foods, dyes, or preservatives Pregnant or trying to get pregnant Breastfeeding How should I use this medication? This medication is injected into a vein. It is given by your care team in a hospital or clinic setting. A special MedGuide will be given to you before each treatment. Be sure to read this information carefully each time. Talk to your care team about the use of this medication in children. While it may be prescribed for children as young as 1 month for selected conditions, precautions do apply. Overdosage: If you think you have taken too much of this medicine contact a poison control center or emergency room at once. NOTE: This medicine is only for you. Do not share this medicine with others. What if I miss a dose? Keep appointments for follow-up doses. It is important not to miss your dose. Call your care team if you are unable to  keep an appointment. What may interact with this medication? Efgartigimod Immune globulin (IVIG) This list may not describe all possible interactions. Give your health care provider a list of all the medicines, herbs, non-prescription drugs, or dietary supplements you use. Also tell them if you smoke, drink alcohol, or use illegal drugs. Some items may interact with your medicine. What should I watch for while using this medication? Visit your care team for regular checks on your progress. Tell your care team if your symptoms do not start to get better or if they get worse. You may need blood work done while you are taking this medication. This medication may increase your risk of getting an infection. Call your care team for advice if you get a fever, chills, sore throat, or other symptoms of a cold or flu. Do not treat yourself. Try to avoid being around people who are sick. Talk to your care team about your vaccination history. To lower your risk of infection, you may need certain vaccines before you start this medication. Carry the Patient Wallet Card with you at all times. Show it to all members of your care team. It describes the signs and symptoms of severe side effects you may have while taking this medication. What side effects may I notice from receiving this medication? Side effects that you should report to your care team as soon as possible: Allergic reactions--skin rash, itching, hives, swelling of the face, lips, tongue, or throat Fever, neck pain or stiffness, sensitivity to light, headache, nausea, vomiting, confusion, which may be signs of meningitis Infection--fever, chills, cough, sore throat,  wounds that don't heal, pain or trouble when passing urine, general feeling of discomfort or being unwell Infusion reactions--chest pain, shortness of breath or trouble breathing, feeling faint or lightheaded Low blood pressure--dizziness, feeling faint or lightheaded, blurry vision Side  effects that usually do not require medical attention (report these to your care team if they continue or are bothersome): Diarrhea Fever Headache Nausea Vomiting This list may not describe all possible side effects. Call your doctor for medical advice about side effects. You may report side effects to FDA at 1-800-FDA-1088. Where should I keep my medication? This medication is given in a hospital or clinic. It will not be stored at home. NOTE: This sheet is a summary. It may not cover all possible information. If you have questions about this medicine, talk to your doctor, pharmacist, or health care provider.  2024 Elsevier/Gold Standard (2023-09-20 00:00:00)

## 2023-12-07 ENCOUNTER — Other Ambulatory Visit: Payer: Self-pay

## 2023-12-20 ENCOUNTER — Other Ambulatory Visit: Payer: Self-pay

## 2024-01-06 ENCOUNTER — Telehealth: Payer: Self-pay

## 2024-01-06 ENCOUNTER — Telehealth: Payer: Self-pay | Admitting: Gastroenterology

## 2024-01-06 NOTE — Telephone Encounter (Signed)
 Inbound call from patient's husband stating patient has been having fecal incontinence. Patient is currently scheduled for 4/23. Husband is requesting a call to discuss options to help sooner. Please advise, thank you.

## 2024-01-06 NOTE — Telephone Encounter (Signed)
 Pt has called req to see Dr Melvyn Neth or speak with him. "I've been having episodes kinda like seizures.This weekend I had about 12". She mentions that a doctor told her it reminded him of a body migraine. She wonders if it could be associated with the cancer. (Looking at last note- I asked her if she has appt with GI yet? She has appt with Dr Chales Abrahams in April, but he will schedule colonoscopy from there. She has become incontinent of bowel). I will notify Dr Melvyn Neth of above for advice.

## 2024-01-07 NOTE — Telephone Encounter (Signed)
 Pt  husband Raiford Noble stated that pt had been having history of incontinence over the last three months, History of colon cancer surgery. Pt to be schedule to see Dr Chales Abrahams on 01/21/2024 at 11:00. ( 7 day hold) Raiford Noble made aware. Raiford Noble verbalized understanding with all questions answered.

## 2024-01-08 DIAGNOSIS — G43009 Migraine without aura, not intractable, without status migrainosus: Secondary | ICD-10-CM | POA: Diagnosis not present

## 2024-01-08 DIAGNOSIS — D696 Thrombocytopenia, unspecified: Secondary | ICD-10-CM | POA: Diagnosis not present

## 2024-01-08 DIAGNOSIS — C189 Malignant neoplasm of colon, unspecified: Secondary | ICD-10-CM | POA: Diagnosis not present

## 2024-01-14 ENCOUNTER — Telehealth: Payer: Self-pay

## 2024-01-14 NOTE — Telephone Encounter (Signed)
 Lisa from [employer] called and advised that she will no longer be in charge of infusion medications for the state of [place] for [employer]. She has given me the new rep, Esperanza Sheets number [phone].

## 2024-01-16 NOTE — Telephone Encounter (Signed)
 Called & confirmed OV with patient for 4/1 at 11:00 am with [address]. Chales Abrahams.

## 2024-01-20 DIAGNOSIS — M1711 Unilateral primary osteoarthritis, right knee: Secondary | ICD-10-CM | POA: Diagnosis not present

## 2024-01-21 ENCOUNTER — Ambulatory Visit: Admitting: Gastroenterology

## 2024-01-21 ENCOUNTER — Encounter: Payer: Self-pay | Admitting: Gastroenterology

## 2024-01-21 VITALS — BP 122/72 | HR 92 | Ht 65.9 in | Wt 230.0 lb

## 2024-01-21 DIAGNOSIS — C184 Malignant neoplasm of transverse colon: Secondary | ICD-10-CM | POA: Diagnosis not present

## 2024-01-21 DIAGNOSIS — K7460 Unspecified cirrhosis of liver: Secondary | ICD-10-CM | POA: Diagnosis not present

## 2024-01-21 DIAGNOSIS — D696 Thrombocytopenia, unspecified: Secondary | ICD-10-CM

## 2024-01-21 MED ORDER — NA SULFATE-K SULFATE-MG SULF 17.5-3.13-1.6 GM/177ML PO SOLN: 1.0000 | Freq: Once | ORAL | 0 refills | Status: AC

## 2024-01-21 NOTE — Progress Notes (Signed)
 Chief Complaint: FU  Referring Provider:  Hurshel Party, NP      ASSESSMENT AND PLAN;   #1. 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  #2. 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 Has low plts (d/t TTP, nl alb)  Plan: -Stool studies- GI pathogens, calprotectin -EGD for EV screening/Colon (May or June 2025) -FU CT chest/Abdo/pelvis as per Dr Melvyn Neth   I discussed EGD/Colonoscopy- the indications, risks, alternatives and potential complications including, but not limited to bleeding, infection, reaction to meds, damage to internal organs, cardiac and/or pulmonary problems, and perforation requiring surgery. The possibility that significant findings could be missed was explained. All ? were answered. Pt consents to proceed.  HPI:    [name] is a 68 y.o. female  With multiple medical problems as below, Nl EF 60-65% (2DE 02/2019), H/O fatty liver  (Nl LFTs with nl alb/plt 06/2022), H/O vasovagal syncope (neg cardiac WU 07/2022), septic arthritis of the hip, obesity, atypical chest pain, skin cancer, carpal tunnel syndrome, chronic vaginitis, chronic venous and sufficiency, TTP, syncope   For FU visit  She has been experiencing persistent diarrhea since her colon surgery approximately one year ago. The diarrhea occurs frequently throughout the day but does not wake her from sleep. She often needs to use the bathroom while awake, especially when caring for her mother. She now has a minute or two to reach the bathroom, which she finds less embarrassing than before.  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 Her platelet count has increased from 74 to 114, with a previous low of 30.  She reports a weight gain of approximately 13 pounds, which she views positively as it indicates improved nutritional absorption. She is currently taking multivitamins,  including B12 and a B complex vitamin, and penicillin twice a day. No use of magnesium or any medications containing magnesium, which she was previously on during chemotherapy.  She recalls a small nodule in her lung that was previously noted, and she is due for a follow-up CT scan to monitor this finding.     Past GI workup: Colonoscopy 12/2022 tumor in the distal transverse colon. Biopsied. Tattooed. - One 10 mm polyp in the proximal ascending colon, removed with a hot snare and removed with a cold snare. Resected and retrieved. - Mild sigmoid diverticulosis. - Non- bleeding internal hemorrhoids. - The examination was otherwise normal on direct and retroflexion views.  Past Medical History:  Diagnosis Date   Abnormal glucose    Achilles tendinitis of left lower extremity    Acute suppurative arthritis due to bacteria (HCC) 03/09/2011   Overview:  Last Assessment & Plan:  Methicillin sensitive staphylococcal aureus septic hip. She clearly does need I&D of the hip. I will have her continue the doxycycline for now but stop it 7 days prior to her surgery to maximize the yield on the cultures in the operating room though I be shocked if we don't find methicillin sensitive staph aureus again.. I agree with removing as much of the pros   Anal fissure 11/30/2015   Arthritis    right hip   Atypical chest pain 04/09/2016   Bilateral edema of lower extremity    BMI 39.0-39.9,adult    Cancer [facility])    SKIN CANCER   Carpal tunnel syndrome 03/09/2011   Overview:  Last Assessment & Plan:  Clinically this seems to be carpal tunnel  syndrome. Giving given her history of infection though we can keep in the back of our mind the idea that she will have a cervical spine problem but I think this is unlikely at this point in time medics carpal tunnel syndrome fits clinically this was a positive Tinel's sign however in for a brace for her.   Chronic vaginitis    Chronic venous insufficiency    Depression     Dyslipidemia    Fatigue 10/13/2015   Fibromyalgia    GERD without esophagitis    History of immune thrombocytopenia 03/09/2011   History of operative procedure on hip 03/10/2012   History of TTP (thrombotic thrombocytopenic purpura)    Hypokalemia    Lichen sclerosus et atrophicus of the vulva 11/30/2015   Major depressive disorder, recurrent, moderate (HCC)    Methicillin susceptible Staphylococcus aureus infection 03/09/2011   Overview:  Last Assessment & Plan:  This is undoubtedlythe culprit organism   Migraine    Morbid obesity with BMI of 45.0-49.9, adult (HCC) 10/13/2015   Morbidly obese (HCC)    MSSA (methicillin susceptible Staphylococcus aureus) infection    Near syncope    Osteoarthritis, chronic    Pain due to total hip replacement (HCC) 09/20/2011   Palpitations 03/04/2019   Postmenopausal    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 syndrome (HCC)    20 yrs ago-not seen anyone for 10 yrs.   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 Mild diverticulosis. Small internal hemorrhoids. Healed anal fissure   ENDOVENOUS ABLATION SAPHENOUS VEIN W/ LASER Right 04/02/2019   endovenous laser ablation right greater saphenous vein by Waverly Ferrari MD    JOINT REPLACEMENT  08/2009   right hip replacement   REVISION TOTAL HIP ARTHROPLASTY     TONSILLECTOMY  1963   TOTAL HIP REVISION  10/08/2011   Procedure: TOTAL HIP REVISION;  Surgeon: [name];  Location: [facility];  Service: Orthopedics;  Laterality: Right;  Resection of Right Total Hip/Extended Trochanteric Osteotomy/Placement of Antibiotic Total Hip Cemented by Depuy   TOTAL HIP REVISION  03/10/2012   Procedure: TOTAL HIP REVISION;  Surgeon: [name], MD;  Location: [facility];  Service: Orthopedics;  Laterality:  Right;  Reimplantation/Revision of a Right Total Hip and Removal of Cemented Implant    Family History  Problem Relation Age of Onset   Hypertension Mother    Hypertension Father    Alzheimer's disease Father    Prostate cancer Father    Hypertension Maternal Grandfather    Seizures Other    Colon cancer Neg Hx    Stomach cancer Neg Hx    Rectal cancer Neg Hx    Esophageal cancer Neg Hx     Social History   Tobacco Use   Smoking status: Former    Current packs/day: 0.00    Average packs/day: 1.5 packs/day for 20.0 years (30.0 ttl pk-yrs)    Types: Cigarettes    Start date: 10/04/1970    Quit date: 10/04/1990    Years since quitting: 33.3   Smokeless tobacco: Former    Quit date: 03/09/1991  Vaping Use   Vaping status: Never Used  Substance Use Topics   Alcohol use: Yes    Comment: OCCASIONAL   Drug use: No    Current Outpatient  Medications  Medication Sig Dispense Refill   buPROPion (WELLBUTRIN XL) 150 MG 24 hr tablet Take 150 mg by mouth every evening.     clobetasol cream (TEMOVATE) 0.05 % Apply 1 Application topically as needed.     clotrimazole-betamethasone (LOTRISONE) cream Apply 1 Application topically 2 (two) times daily.     divalproex (DEPAKOTE) 250 MG DR tablet Take 250 mg by mouth 2 (two) times daily.     DULoxetine (CYMBALTA) 60 MG capsule Take 60 mg by mouth at bedtime.      esomeprazole (NEXIUM) 40 MG capsule Take 40 mg by mouth daily. Pt states has been on for years     Multiple Vitamin (MULTIVITAMIN WITH MINERALS) TABS tablet Take 1 tablet by mouth daily.     nortriptyline (PAMELOR) 10 MG capsule Take 10 mg by mouth.     penicillin v potassium (VEETID) 500 MG tablet Take 1 tablet (500 mg total) by mouth 2 (two) times daily. 180 tablet 3   potassium chloride SA (KLOR-CON M) 20 MEQ tablet Take 3 tablet today, then start 1 tablet twice daily 64 tablet 0   SUMAtriptan (IMITREX) 25 MG tablet Take 25 mg by mouth.     triamterene-hydrochlorothiazide  (DYAZIDE) 37.5-25 MG capsule Take 1 capsule by mouth daily.     aluminum-magnesium hydroxide 200-200 MG/5ML suspension Take 15 mLs by mouth every 6 (six) hours as needed for indigestion. (Patient not taking: Reported on 01/21/2024)     loperamide (IMODIUM) 2 MG capsule Take by mouth. (Patient not taking: Reported on 01/21/2024)     No current facility-administered medications for this visit.    No Known Allergies  Review of Systems:  neg     Physical Exam:    BP 122/72   Pulse 92   Ht 5' 5.9" (1.674 m)   Wt 230 lb (104.3 kg)   LMP 04/05/2011   BMI 37.24 kg/m  Wt Readings from Last 3 Encounters:  01/21/24 230 lb (104.3 kg)  12/05/23 222 lb 11.2 oz (101 kg)  10/21/23 217 lb 1.6 oz (98.5 kg)   Constitutional:  Well-developed, in no acute distress. Psychiatric: Normal mood and affect. Behavior is normal. HEENT: Pupils normal.  Conjunctivae are normal. No scleral icterus. Cardiovascular: Normal rate, regular rhythm. No edema Pulmonary/chest: Effort normal and breath sounds normal. No wheezing, rales or rhonchi. Abdominal: Soft, nondistended. Nontender. Bowel sounds active throughout. There are no masses palpable. No hepatomegaly. Rectal: Deferred Neurological: Alert and oriented to person place and time. Skin: Skin is warm and dry. No rashes noted.  Data Reviewed: I have personally reviewed following labs and imaging studies  CBC:    Latest Ref Rng & Units 12/05/2023   11:43 AM 10/10/2023    9:49 AM 09/09/2023    9:33 AM  CBC  WBC 4.0 - 10.5 K/uL 3.6  4.0  4.4   Hemoglobin 12.0 - 15.0 g/dL 57.8  46.9  62.9   Hematocrit 36.0 - 46.0 % 35.3  33.5  32.3   Platelets 150 - 400 K/uL 114  93  104     CMP:    Latest Ref Rng & Units 12/05/2023   11:43 AM 10/10/2023    9:49 AM 09/09/2023    9:33 AM  CMP  Glucose 70 - 99 mg/dL 92  528  413   BUN 8 - 23 mg/dL 13  9  13    Creatinine 0.44 - 1.00 mg/dL 2.44  0.10  2.72   Sodium 135 - 145 mmol/L 140  141  140   Potassium 3.5 - 5.1  mmol/L 4.1  3.7  4.4   Chloride 98 - 111 mmol/L 105  106  106   CO2 22 - 32 mmol/L 25  26  24    Calcium 8.9 - 10.3 mg/dL 9.3  9.6  9.3   Total Protein 6.5 - 8.1 g/dL 6.4  6.3  6.3   Total Bilirubin 0.0 - 1.2 mg/dL 0.5  0.4  0.3   Alkaline Phos 38 - 126 U/L 111  84  101   AST 15 - 41 U/L 23  21  23    ALT 0 - 44 U/L 18  11  15         Edman Circle, MD 01/21/2024, 11:12 AM  Cc: Hurshel Party, NP

## 2024-01-21 NOTE — Patient Instructions (Addendum)
 _______________________________________________________  If your blood pressure at your visit was 140/90 or greater, please contact your primary care physician to follow up on this.  _______________________________________________________  If you are age 67 or older, your body mass index should be between 23-30. Your Body mass index is 37.24 kg/m. If this is out of the aforementioned range listed, please consider follow up with your Primary Care Provider.  If you are age 50 or younger, your body mass index should be between 19-25. Your Body mass index is 37.24 kg/m. If this is out of the aformentioned range listed, please consider follow up with your Primary Care Provider.   ________________________________________________________  The [facility] providers would like to encourage you to use [facility] to communicate with providers for non-urgent requests or questions.  Due to long hold times on the telephone, sending your provider a message by [facility] may be a faster and more efficient way to get a response.  Please allow 48 business hours for a response.  Please remember that this is for non-urgent requests.  _______________________________________________________  Your provider has requested that you go to the basement level for lab work before leaving today. Press "B" on the elevator. The lab is located at the first door on the left as you exit the elevator.  We have sent the following medications to your pharmacy for you to pick up at your convenience: Suprep  Your provider has ordered "Diatherix" stool testing for you. You have received a kit from our office today containing all necessary supplies to complete this test. Please carefully read the stool collection instructions provided in the kit before opening the accompanying materials. In addition, be sure there is a label providing your full name and date of birth on the "puritan opti-swab" tube that is supplied in the kit (if you do not  see a label with this information on your test tube, please make [place] aware before test collection!). After completing the test, you should secure the purtian tube into the specimen biohazard bag. The [facility] E-Req sheet (including date and time of specimen collection) should be placed into the outside pocket of the specimen biohazard bag and returned to the [place] lab (basement floor of [facility]) within 2 days of collection. Please make sure to give the specimen to a staff member at the lab. DO NOT leave the specimen on the counter.   If the specimen date and time (can be found in the upper right boxed portion of the sheet) are not filled out on the E-Req sheet, the test will NOT be performed.   You have been scheduled for an endoscopy and colonoscopy. Please follow the written instructions given to you at your visit today.  If you use inhalers (even only as needed), please bring them with you on the day of your procedure.  DO NOT TAKE 7 DAYS PRIOR TO TEST- Trulicity (dulaglutide) Ozempic, Wegovy (semaglutide) Mounjaro (tirzepatide) Bydureon Bcise (exanatide extended release)  DO NOT TAKE 1 DAY PRIOR TO YOUR TEST Rybelsus (semaglutide) Adlyxin (lixisenatide) Victoza (liraglutide) Byetta (exanatide) ___________________________________________________________________________   Thank you,  Dr. Lynann Bologna

## 2024-01-22 ENCOUNTER — Other Ambulatory Visit: Payer: Self-pay | Admitting: Pharmacist

## 2024-01-22 ENCOUNTER — Other Ambulatory Visit: Payer: Self-pay

## 2024-01-27 ENCOUNTER — Other Ambulatory Visit

## 2024-01-27 DIAGNOSIS — D696 Thrombocytopenia, unspecified: Secondary | ICD-10-CM

## 2024-01-27 DIAGNOSIS — C184 Malignant neoplasm of transverse colon: Secondary | ICD-10-CM | POA: Diagnosis not present

## 2024-01-27 DIAGNOSIS — K7460 Unspecified cirrhosis of liver: Secondary | ICD-10-CM | POA: Diagnosis not present

## 2024-01-29 LAB — CALPROTECTIN, FECAL: Calprotectin, Fecal: 71 ug/g (ref 0–120)

## 2024-01-29 NOTE — Progress Notes (Signed)
 [facility]  [address] [place],  [place]  [address] [phone]  Clinic Day:  02/02/2024  Referring physician: Olga Berthold, NP   HISTORY OF PRESENT ILLNESS:  The patient is a 68 y.o. 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  She received 4 of 6 planned cycles of Xeloda.  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  She comes in today to be evaluated before heading into a potential 5th cycle of therapy.  Overall, she claims to be doing well.  Her thinking is mildly off, per her husband, but it has gotten better over these past months.  Of note, the patient carries a diagnosis of TTP, which required exchange plasmapheresis and steroids back in 1989.  PHYSICAL EXAM:  Blood pressure 124/70, pulse 84, temperature 98 F (36.7 C), temperature source Oral, resp. rate 16, height 5' 5.9" (1.674 m), weight 227 lb 1.6 oz (103 kg), last menstrual period 04/05/2011, SpO2 100%. Wt Readings from Last 3 Encounters:  01/30/24 227 lb 1.6 oz (103 kg)  01/21/24 230 lb (104.3 kg)  12/05/23 222 lb 11.2 oz (101 kg)   Body mass index is 36.77 kg/m. Performance status (ECOG): 1 - Symptomatic but completely ambulatory Physical Exam Constitutional:      Appearance: Normal appearance. She is not ill-appearing.  HENT:     Mouth/Throat:     Mouth: Mucous membranes are moist.     Pharynx: Oropharynx is clear. No oropharyngeal exudate or posterior oropharyngeal erythema.  Cardiovascular:     Rate and Rhythm: Normal rate and regular rhythm.     Heart sounds: No murmur heard.    No friction rub. No gallop.  Pulmonary:     Effort: Pulmonary effort is normal. No respiratory distress.     Breath sounds: Normal breath sounds. No wheezing, rhonchi or rales.  Abdominal:     General: Bowel sounds are normal. There is no distension.     Palpations:  Abdomen is soft. There is no mass.     Tenderness: There is no abdominal tenderness.  Musculoskeletal:        General: No swelling.     Right lower leg: No edema.     Left lower leg: No edema.  Lymphadenopathy:     Cervical: No cervical adenopathy.     Upper Body:     Right upper body: No supraclavicular or axillary adenopathy.     Left upper body: No supraclavicular or axillary adenopathy.     Lower Body: No right inguinal adenopathy. No left inguinal adenopathy.  Skin:    General: Skin is warm.     Coloration: Skin is not jaundiced.     Findings: No lesion or rash.  Neurological:     General: No focal deficit present.     Mental Status: She is alert and oriented to person, place, and time. Mental status is at baseline.  Psychiatric:        Mood and Affect: Mood normal.        Behavior: Behavior normal.        Thought Content: Thought content normal.    LABS:      Latest Ref Rng & Units 01/30/2024   10:10 AM 12/05/2023   11:43 AM 10/10/2023    9:49 AM  CBC  WBC 4.0 - 10.5 K/uL 6.0  3.6  4.0   Hemoglobin 12.0 -  15.0 g/dL 81.1  91.4  78.2   Hematocrit 36.0 - 46.0 % 40.1  35.3  33.5   Platelets 150 - 400 K/uL 103  114  93       Latest Ref Rng & Units 01/30/2024   10:10 AM 12/05/2023   11:43 AM 10/10/2023    9:49 AM  CMP  Glucose 70 - 99 mg/dL 88  92  956   BUN 8 - 23 mg/dL 15  13  9    Creatinine 0.44 - 1.00 mg/dL 2.13  0.86  5.78   Sodium 135 - 145 mmol/L 143  140  141   Potassium 3.5 - 5.1 mmol/L 4.4  4.1  3.7   Chloride 98 - 111 mmol/L 107  105  106   CO2 22 - 32 mmol/L 26  25  26    Calcium 8.9 - 10.3 mg/dL 9.6  9.3  9.6   Total Protein 6.5 - 8.1 g/dL 6.7  6.4  6.3   Total Bilirubin 0.0 - 1.2 mg/dL 0.4  0.5  0.4   Alkaline Phos 38 - 126 U/L 102  111  84   AST 15 - 41 U/L 20  23  21    ALT 0 - 44 U/L 16  18  11      Latest Reference Range & Units 01/30/24 10:10  LDH 98 - 192 U/L 173    Latest Reference Range & Units 01/30/24 10:10  CEA ([facility]) 0.00 - 5.00 ng/mL  3.94    ASSESSMENT & PLAN:  Assessment/Plan:  A 68 y.o. 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  When evaluating her labs today, her hemoglobin is completely normal.  Furthermore, her LDH levels have been normal for numerous months.  I do believe her hemolytic process has now abated.  Based upon this, her Ravulizumab will be discontinued.  However, her hemoglobin will be followed monthly over these next few months to ensure it is not falling, which would be suspicious for recurrent HUS potentially developing.  With respect to her colon cancer, her normal CEA level today suggests she remains disease-free.  She is scheduled for her postsurgical colonoscopy in the forthcoming weeks for her disease surveillance.  Otherwise, I will see this patient back in 4 months for repeat clinical assessment.  The patient understands all the plans discussed today and is in agreement with them.  Tiasia Weberg Felicia Horde, MD

## 2024-01-30 ENCOUNTER — Inpatient Hospital Stay: Payer: Medicare PPO | Attending: Oncology | Admitting: Oncology

## 2024-01-30 ENCOUNTER — Inpatient Hospital Stay: Payer: Medicare PPO

## 2024-01-30 ENCOUNTER — Other Ambulatory Visit: Payer: Self-pay | Admitting: Oncology

## 2024-01-30 ENCOUNTER — Encounter: Payer: Self-pay | Admitting: Oncology

## 2024-01-30 DIAGNOSIS — D5939 Other hemolytic-uremic syndrome: Secondary | ICD-10-CM

## 2024-01-30 DIAGNOSIS — D594 Other nonautoimmune hemolytic anemias: Secondary | ICD-10-CM | POA: Insufficient documentation

## 2024-01-30 DIAGNOSIS — Z85038 Personal history of other malignant neoplasm of large intestine: Secondary | ICD-10-CM | POA: Diagnosis not present

## 2024-01-30 DIAGNOSIS — C184 Malignant neoplasm of transverse colon: Secondary | ICD-10-CM

## 2024-01-30 DIAGNOSIS — M311 Thrombotic microangiopathy, unspecified: Secondary | ICD-10-CM

## 2024-01-30 LAB — CBC WITH DIFFERENTIAL (CANCER CENTER ONLY)
Abs Immature Granulocytes: 0.02 10*3/uL (ref 0.00–0.07)
Basophils Absolute: 0 10*3/uL (ref 0.0–0.1)
Basophils Relative: 1 %
Eosinophils Absolute: 0.1 10*3/uL (ref 0.0–0.5)
Eosinophils Relative: 1 %
HCT: 40.1 % (ref 36.0–46.0)
Hemoglobin: 13.5 g/dL (ref 12.0–15.0)
Immature Granulocytes: 0 %
Lymphocytes Relative: 25 %
Lymphs Abs: 1.5 10*3/uL (ref 0.7–4.0)
MCH: 30.9 pg (ref 26.0–34.0)
MCHC: 33.7 g/dL (ref 30.0–36.0)
MCV: 91.8 fL (ref 80.0–100.0)
Monocytes Absolute: 0.6 10*3/uL (ref 0.1–1.0)
Monocytes Relative: 9 %
Neutro Abs: 3.8 10*3/uL (ref 1.7–7.7)
Neutrophils Relative %: 64 %
Platelet Count: 103 10*3/uL — ABNORMAL LOW (ref 150–400)
RBC: 4.37 MIL/uL (ref 3.87–5.11)
RDW: 11.9 % (ref 11.5–15.5)
WBC Count: 6 10*3/uL (ref 4.0–10.5)
nRBC: 0 % (ref 0.0–0.2)
nRBC: 0 /100{WBCs}

## 2024-01-30 LAB — LACTATE DEHYDROGENASE: LDH: 173 U/L (ref 98–192)

## 2024-01-30 LAB — CMP (CANCER CENTER ONLY)
ALT: 16 U/L (ref 0–44)
AST: 20 U/L (ref 15–41)
Albumin: 4.6 g/dL (ref 3.5–5.0)
Alkaline Phosphatase: 102 U/L (ref 38–126)
Anion gap: 10 (ref 5–15)
BUN: 15 mg/dL (ref 8–23)
CO2: 26 mmol/L (ref 22–32)
Calcium: 9.6 mg/dL (ref 8.9–10.3)
Chloride: 107 mmol/L (ref 98–111)
Creatinine: 0.87 mg/dL (ref 0.44–1.00)
GFR, Estimated: >60 mL/min (ref >60)
Glucose, Bld: 88 mg/dL (ref 70–99)
Potassium: 4.4 mmol/L (ref 3.5–5.1)
Sodium: 143 mmol/L (ref 135–145)
Total Bilirubin: 0.4 mg/dL (ref 0.0–1.2)
Total Protein: 6.7 g/dL (ref 6.5–8.1)

## 2024-01-30 LAB — CEA (ACCESS): CEA (CHCC): 3.94 ng/mL (ref 0.00–5.00)

## 2024-01-31 ENCOUNTER — Other Ambulatory Visit: Payer: Self-pay

## 2024-02-02 ENCOUNTER — Encounter: Payer: Self-pay | Admitting: Oncology

## 2024-02-05 DIAGNOSIS — R413 Other amnesia: Secondary | ICD-10-CM | POA: Diagnosis not present

## 2024-02-05 DIAGNOSIS — R519 Headache, unspecified: Secondary | ICD-10-CM | POA: Diagnosis not present

## 2024-02-05 DIAGNOSIS — R4701 Aphasia: Secondary | ICD-10-CM | POA: Diagnosis not present

## 2024-02-10 DIAGNOSIS — Z0000 Encounter for general adult medical examination without abnormal findings: Secondary | ICD-10-CM | POA: Diagnosis not present

## 2024-02-10 DIAGNOSIS — Z9181 History of falling: Secondary | ICD-10-CM | POA: Diagnosis not present

## 2024-02-12 ENCOUNTER — Ambulatory Visit: Payer: Medicare PPO | Admitting: Gastroenterology

## 2024-02-20 ENCOUNTER — Telehealth: Payer: Self-pay

## 2024-02-20 DIAGNOSIS — R519 Headache, unspecified: Secondary | ICD-10-CM | POA: Diagnosis not present

## 2024-02-20 DIAGNOSIS — R413 Other amnesia: Secondary | ICD-10-CM | POA: Diagnosis not present

## 2024-02-20 NOTE — Telephone Encounter (Signed)
 Came back negative. Mychart patient as well

## 2024-03-02 ENCOUNTER — Inpatient Hospital Stay: Attending: Oncology

## 2024-03-02 DIAGNOSIS — C184 Malignant neoplasm of transverse colon: Secondary | ICD-10-CM | POA: Insufficient documentation

## 2024-03-02 LAB — CBC WITH DIFFERENTIAL (CANCER CENTER ONLY)
Abs Immature Granulocytes: 0.01 10*3/uL (ref 0.00–0.07)
Basophils Absolute: 0 10*3/uL (ref 0.0–0.1)
Basophils Relative: 1 %
Eosinophils Absolute: 0.1 10*3/uL (ref 0.0–0.5)
Eosinophils Relative: 2 %
HCT: 37.1 % (ref 36.0–46.0)
Hemoglobin: 12.6 g/dL (ref 12.0–15.0)
Immature Granulocytes: 0 %
Lymphocytes Relative: 23 %
Lymphs Abs: 1 10*3/uL (ref 0.7–4.0)
MCH: 31.7 pg (ref 26.0–34.0)
MCHC: 34 g/dL (ref 30.0–36.0)
MCV: 93.5 fL (ref 80.0–100.0)
Monocytes Absolute: 0.5 10*3/uL (ref 0.1–1.0)
Monocytes Relative: 11 %
Neutro Abs: 2.8 10*3/uL (ref 1.7–7.7)
Neutrophils Relative %: 63 %
Platelet Count: 87 10*3/uL — ABNORMAL LOW (ref 150–400)
RBC: 3.97 MIL/uL (ref 3.87–5.11)
RDW: 12.4 % (ref 11.5–15.5)
WBC Count: 4.4 10*3/uL (ref 4.0–10.5)
nRBC: 0 % (ref 0.0–0.2)

## 2024-03-03 ENCOUNTER — Encounter: Payer: Self-pay | Admitting: Gastroenterology

## 2024-03-04 ENCOUNTER — Encounter: Payer: Self-pay | Admitting: Oncology

## 2024-03-10 ENCOUNTER — Ambulatory Visit (AMBULATORY_SURGERY_CENTER): Admitting: Gastroenterology

## 2024-03-10 ENCOUNTER — Encounter: Payer: Self-pay | Admitting: Gastroenterology

## 2024-03-10 VITALS — BP 111/63 | HR 78 | Temp 98.3°F | Resp 17 | Ht 65.9 in | Wt 230.0 lb

## 2024-03-10 DIAGNOSIS — Z85038 Personal history of other malignant neoplasm of large intestine: Secondary | ICD-10-CM

## 2024-03-10 DIAGNOSIS — Z1211 Encounter for screening for malignant neoplasm of colon: Secondary | ICD-10-CM

## 2024-03-10 DIAGNOSIS — D696 Thrombocytopenia, unspecified: Secondary | ICD-10-CM

## 2024-03-10 DIAGNOSIS — K2970 Gastritis, unspecified, without bleeding: Secondary | ICD-10-CM

## 2024-03-10 DIAGNOSIS — K7460 Unspecified cirrhosis of liver: Secondary | ICD-10-CM | POA: Diagnosis not present

## 2024-03-10 DIAGNOSIS — K635 Polyp of colon: Secondary | ICD-10-CM | POA: Diagnosis not present

## 2024-03-10 DIAGNOSIS — D122 Benign neoplasm of ascending colon: Secondary | ICD-10-CM | POA: Diagnosis not present

## 2024-03-10 DIAGNOSIS — Z09 Encounter for follow-up examination after completed treatment for conditions other than malignant neoplasm: Secondary | ICD-10-CM | POA: Diagnosis not present

## 2024-03-10 DIAGNOSIS — Z980 Intestinal bypass and anastomosis status: Secondary | ICD-10-CM | POA: Diagnosis not present

## 2024-03-10 DIAGNOSIS — K640 First degree hemorrhoids: Secondary | ICD-10-CM | POA: Diagnosis not present

## 2024-03-10 DIAGNOSIS — K3189 Other diseases of stomach and duodenum: Secondary | ICD-10-CM

## 2024-03-10 DIAGNOSIS — K766 Portal hypertension: Secondary | ICD-10-CM

## 2024-03-10 MED ORDER — SODIUM CHLORIDE 0.9 % IV SOLN: 500.0000 mL | Freq: Once | INTRAVENOUS | Status: DC

## 2024-03-10 NOTE — Progress Notes (Signed)
 Chief Complaint: FU  Referring Provider:  Olga Berthold, NP      ASSESSMENT AND PLAN;   #1. 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  #2. 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 Has low plts (d/t TTP, nl alb)  Plan: -EGD for EV screening/Colon (May or June 2025) -FU CT chest/Abdo/pelvis as per Dr Harles Lied   I discussed EGD/Colonoscopy- the indications, risks, alternatives and potential complications including, but not limited to bleeding, infection, reaction to meds, damage to internal organs, cardiac and/or pulmonary problems, and perforation requiring surgery. The possibility that significant findings could be missed was explained. All ? were answered. Pt consents to proceed.  HPI:    [name] is a 68 y.o. female  With multiple medical problems as below, Nl EF 60-65% (2DE 02/2019), H/O fatty liver  (Nl LFTs with nl alb/plt 06/2022), H/O vasovagal syncope (neg cardiac WU 07/2022), septic arthritis of the hip, obesity, atypical chest pain, skin cancer, carpal tunnel syndrome, chronic vaginitis, chronic venous and sufficiency, TTP, syncope   For FU visit  She has been experiencing persistent diarrhea since her colon surgery approximately one year ago. The diarrhea occurs frequently throughout the day but does not wake her from sleep. She often needs to use the bathroom while awake, especially when caring for her mother. She now has a minute or two to reach the bathroom, which she finds less embarrassing than before.  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 Her platelet count has increased from 74 to 114, with a previous low of 30.  She reports a weight gain of approximately 13 pounds, which she views positively as it indicates improved nutritional absorption. She is currently taking multivitamins, including B12 and a B complex vitamin, and  penicillin  twice a day. No use of magnesium  or any medications containing magnesium , which she was previously on during chemotherapy.  She recalls a small nodule in her lung that was previously noted, and she is due for a follow-up CT scan to monitor this finding.     Past GI workup: Colonoscopy 12/2022 tumor in the distal transverse colon. Biopsied. Tattooed. - One 10 mm polyp in the proximal ascending colon, removed with a hot snare and removed with a cold snare. Resected and retrieved. - Mild sigmoid diverticulosis. - Non- bleeding internal hemorrhoids. - The examination was otherwise normal on direct and retroflexion views.  Past Medical History:  Diagnosis Date   Abnormal glucose    Achilles tendinitis of left lower extremity    Acute suppurative arthritis due to bacteria (HCC) 03/09/2011   Overview:  Last Assessment & Plan:  Methicillin sensitive staphylococcal aureus septic hip. She clearly does need I&D of the hip. I will have her continue the doxycycline  for now but stop it 7 days prior to her surgery to maximize the yield on the cultures in the operating room though I be shocked if we don't find methicillin sensitive staph aureus again.. I agree with removing as much of the pros   Anal fissure 11/30/2015   Arthritis    right hip   Atypical chest pain 04/09/2016   Bilateral edema of lower extremity    BMI 39.0-39.9,adult    Cancer [facility])    SKIN CANCER   Carpal tunnel syndrome 03/09/2011   Overview:  Last Assessment & Plan:  Clinically this seems to be carpal tunnel syndrome. Giving given her history  of infection though we can keep in the back of our mind the idea that she will have a cervical spine problem but I think this is unlikely at this point in time medics carpal tunnel syndrome fits clinically this was a positive Tinel's sign however in for a brace for her.   Chronic vaginitis    Chronic venous insufficiency    Depression    Dyslipidemia    Fatigue 10/13/2015    Fibromyalgia    GERD without esophagitis    History of immune thrombocytopenia 03/09/2011   History of operative procedure on hip 03/10/2012   History of TTP (thrombotic thrombocytopenic purpura)    Hypokalemia    Lichen sclerosus et atrophicus of the vulva 11/30/2015   Major depressive disorder, recurrent, moderate (HCC)    Methicillin susceptible Staphylococcus aureus infection 03/09/2011   Overview:  Last Assessment & Plan:  This is undoubtedlythe culprit organism   Migraine    Morbid obesity with BMI of 45.0-49.9, adult (HCC) 10/13/2015   Morbidly obese (HCC)    MSSA (methicillin susceptible Staphylococcus aureus) infection    Near syncope    Osteoarthritis, chronic    Pain due to total hip replacement (HCC) 09/20/2011   Palpitations 03/04/2019   Postmenopausal    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 syndrome (HCC)    20 yrs ago-not seen anyone for 10 yrs.   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 Mild diverticulosis. Small internal hemorrhoids. Healed anal fissure   ENDOVENOUS ABLATION SAPHENOUS VEIN W/ LASER Right 04/02/2019   endovenous laser ablation right greater saphenous vein by Brayton Calin MD    JOINT REPLACEMENT  08/2009   right hip replacement   REVISION TOTAL HIP ARTHROPLASTY     TONSILLECTOMY  1963   TOTAL HIP REVISION  10/08/2011   Procedure: TOTAL HIP REVISION;  Surgeon: Bevin Bucks;  Location: [facility];  Service: Orthopedics;  Laterality: Right;  Resection of Right Total Hip/Extended Trochanteric Osteotomy/Placement of Antibiotic Total Hip Cemented by Depuy   TOTAL HIP REVISION  03/10/2012   Procedure: TOTAL HIP REVISION;  Surgeon: Bevin Bucks, MD;  Location: [facility];  Service: Orthopedics;  Laterality: Right;  Reimplantation/Revision of a  Right Total Hip and Removal of Cemented Implant    Family History  Problem Relation Age of Onset   Hypertension Mother    Hypertension Father    Alzheimer's disease Father    Prostate cancer Father    Hypertension Maternal Grandfather    Seizures Other    Colon cancer Neg Hx    Stomach cancer Neg Hx    Rectal cancer Neg Hx    Esophageal cancer Neg Hx     Social History   Tobacco Use   Smoking status: Former    Current packs/day: 0.00    Average packs/day: 1.5 packs/day for 20.0 years (30.0 ttl pk-yrs)    Types: Cigarettes    Start date: 10/04/1970    Quit date: 10/04/1990    Years since quitting: 33.4   Smokeless tobacco: Former    Quit date: 03/09/1991  Vaping Use   Vaping status: Never Used  Substance Use Topics   Alcohol use: Yes    Comment: OCCASIONAL   Drug use: No    Current Outpatient Medications  Medication Sig Dispense  Refill   DULoxetine  (CYMBALTA ) 60 MG capsule Take 60 mg by mouth at bedtime.      Multiple Vitamin (MULTIVITAMIN WITH MINERALS) TABS tablet Take 1 tablet by mouth daily.     potassium chloride  SA (KLOR-CON  M) 20 MEQ tablet Take 3 tablet today, then start 1 tablet twice daily 64 tablet 0   SUMAtriptan (IMITREX) 25 MG tablet Take 25 mg by mouth.     buPROPion (WELLBUTRIN XL) 150 MG 24 hr tablet Take 150 mg by mouth every evening.     clobetasol cream (TEMOVATE) 0.05 % Apply 1 Application topically as needed. (Patient not taking: Reported on 03/10/2024)     clotrimazole-betamethasone (LOTRISONE) cream Apply 1 Application topically 2 (two) times daily.     divalproex (DEPAKOTE) 250 MG DR tablet Take 250 mg by mouth 3 (three) times daily.     esomeprazole (NEXIUM) 40 MG capsule Take 40 mg by mouth daily. Pt states has been on for years     nortriptyline (PAMELOR) 10 MG capsule Take 10 mg by mouth.     penicillin  v potassium (VEETID) 500 MG tablet Take 1 tablet (500 mg total) by mouth 2 (two) times daily. 180 tablet 3   triamterene -hydrochlorothiazide  (DYAZIDE) 37.5-25 MG capsule Take 1 capsule by mouth daily.     Current Facility-Administered Medications  Medication Dose Route Frequency Provider Last Rate Last Admin   0.9 %  sodium chloride  infusion  500 mL Intravenous Once Lajuan Pila, MD        Allergies  Allergen Reactions   Aloe Vera Other (See Comments)    "Stuck onto skin" per pt    Review of Systems:  neg     Physical Exam:    BP 133/72   Pulse 89   Temp 98.3 F (36.8 C) (Temporal)   Ht 5' 5.9" (1.674 m)   Wt 230 lb (104.3 kg)   LMP 04/05/2011   SpO2 96%   BMI 37.24 kg/m  Wt Readings from Last 3 Encounters:  03/10/24 230 lb (104.3 kg)  01/30/24 227 lb 1.6 oz (103 kg)  01/21/24 230 lb (104.3 kg)   Constitutional:  Well-developed, in no acute distress. Psychiatric: Normal mood and affect. Behavior is normal. HEENT: Pupils normal.  Conjunctivae are normal. No scleral icterus. Cardiovascular: Normal rate, regular rhythm. No edema Pulmonary/chest: Effort normal and breath sounds normal. No wheezing, rales or rhonchi. Abdominal: Soft, nondistended. Nontender. Bowel sounds active throughout. There are no masses palpable. No hepatomegaly. Rectal: Deferred Neurological: Alert and oriented to person place and time. Skin: Skin is warm and dry. No rashes noted.  Data Reviewed: I have personally reviewed following labs and imaging studies  CBC:    Latest Ref Rng & Units 03/02/2024   10:05 AM 01/30/2024   10:10 AM 12/05/2023   11:43 AM  CBC  WBC 4.0 - 10.5 K/uL 4.4  6.0  3.6   Hemoglobin 12.0 - 15.0 g/dL 16.1  09.6  04.5   Hematocrit 36.0 - 46.0 % 37.1  40.1  35.3   Platelets 150 - 400 K/uL 87  103  114     CMP:    Latest Ref Rng & Units 01/30/2024   10:10 AM 12/05/2023   11:43 AM 10/10/2023    9:49 AM  CMP  Glucose 70 - 99 mg/dL 88  92  409   BUN 8 - 23 mg/dL 15  13  9    Creatinine 0.44 - 1.00 mg/dL 8.11  9.14  7.82   Sodium  135 - 145 mmol/L 143  140  141   Potassium 3.5 - 5.1 mmol/L 4.4  4.1  3.7    Chloride 98 - 111 mmol/L 107  105  106   CO2 22 - 32 mmol/L 26  25  26    Calcium 8.9 - 10.3 mg/dL 9.6  9.3  9.6   Total Protein 6.5 - 8.1 g/dL 6.7  6.4  6.3   Total Bilirubin 0.0 - 1.2 mg/dL 0.4  0.5  0.4   Alkaline Phos 38 - 126 U/L 102  111  84   AST 15 - 41 U/L 20  23  21    ALT 0 - 44 U/L 16  18  11         Magnus Schuller, MD 03/10/2024, 3:27 PM  Cc: Olga Berthold, NP

## 2024-03-10 NOTE — Op Note (Signed)
 [facility] Patient Name: [name] Procedure Date: 03/10/2024 3:14 PM MRN: [id number] Endoscopist: Lajuan Pila , MD, [id number] Age: 68 Referring MD:  Date of Birth: 03-24-1956 Gender: Female Account #: [id number] Procedure:                Colonoscopy Indications:              High risk colon cancer surveillance: Transverse                            colon ca (Stage IIB) s/p resection 01/2023 s/p                            neoadjuvant chemo. Medicines:                Monitored Anesthesia Care Procedure:                Pre-Anesthesia Assessment:                           - Prior to the procedure, a History and Physical                            was performed, and patient medications and                            allergies were reviewed. The patient's tolerance of                            previous anesthesia was also reviewed. The risks                            and benefits of the procedure and the sedation                            options and risks were discussed with the patient.                            All questions were answered, and informed consent                            was obtained. Prior Anticoagulants: The patient has                            taken no anticoagulant or antiplatelet agents. ASA                            Grade Assessment: III - A patient with severe                            systemic disease. After reviewing the risks and                            benefits, the patient was deemed in satisfactory  condition to undergo the procedure.                           After obtaining informed consent, the colonoscope                            was passed under direct vision. Throughout the                            procedure, the patient's blood pressure, pulse, and                            oxygen saturations were monitored continuously. The                            CF [id number] #[id number] was introduced through the  anus                            and advanced to the the cecum, identified by                            appendiceal orifice and ileocecal valve. The                            colonoscopy was performed without difficulty. The                            patient tolerated the procedure well. The quality                            of the bowel preparation was adequate to identify                            polyps. The ileocecal valve, appendiceal orifice,                            and rectum were photographed. Scope In: 3:43:29 PM Scope Out: 3:56:35 PM Scope Withdrawal Time: 0 hours 10 minutes 13 seconds  Total Procedure Duration: 0 hours 13 minutes 6 seconds  Findings:                 A 6 mm polyp was found in the mid ascending colon.                            The polyp was sessile. The polyp was removed with a                            cold snare. Resection and retrieval were complete.                           There was evidence of a prior end-to-end                            colo-colonic anastomosis in the proximal  descending                            colon and in the distal transverse colon. This was                            patent and was characterized by healthy appearing                            mucosa.                           Non-bleeding internal hemorrhoids were found during                            retroflexion. The hemorrhoids were small and Grade                            I (internal hemorrhoids that do not prolapse).                           The exam was otherwise without abnormality on                            direct and retroflexion views. Complications:            No immediate complications. Estimated Blood Loss:     Estimated blood loss: none. Impression:               - One 6 mm polyp in the mid ascending colon,                            removed with a cold snare. Resected and retrieved.                           - Patent end-to-end colo-colonic  anastomosis,                            characterized by healthy appearing mucosa.                           - Non-bleeding internal hemorrhoids.                           - The examination was otherwise normal on direct                            and retroflexion views. Recommendation:           - Patient has a contact number available for                            emergencies. The signs and symptoms of potential                            delayed complications were discussed with the  patient. Return to normal activities tomorrow.                            Written discharge instructions were provided to the                            patient.                           - Resume previous diet.                           - Continue present medications.                           - Await pathology results.                           - Repeat colonoscopy for surveillance based on                            pathology results. Likely repeat in 3 years.                           - The findings and recommendations were discussed                            with the patient's family. Lajuan Pila, MD 03/10/2024 4:06:04 PM This report has been signed electronically.

## 2024-03-10 NOTE — Progress Notes (Signed)
 Sedate, gd SR, tolerated procedure well, VSS, report to RN

## 2024-03-10 NOTE — Progress Notes (Signed)
 Called to room to assist during endoscopic procedure.  Patient ID and intended procedure confirmed with present staff. Received instructions for my participation in the procedure from the performing physician.

## 2024-03-10 NOTE — Patient Instructions (Signed)
 Resume previous diet Continue present medications Await pathology results Repeat upper endoscopy in 2 years Repeat colonoscopy for surveillance based on pathology results, likely repeat in 3 years. See handouts on polyps and hemorrhoids YOU HAD AN ENDOSCOPIC PROCEDURE TODAY AT THE  ENDOSCOPY CENTER:   Refer to the procedure report that was given to you for any specific questions about what was found during the examination.  If the procedure report does not answer your questions, please call your gastroenterologist to clarify.  If you requested that your care partner not be given the details of your procedure findings, then the procedure report has been included in a sealed envelope for you to review at your convenience later.  YOU SHOULD EXPECT: Some feelings of bloating in the abdomen. Passage of more gas than usual.  Walking can help get rid of the air that was put into your GI tract during the procedure and reduce the bloating. If you had a lower endoscopy (such as a colonoscopy or flexible sigmoidoscopy) you may notice spotting of blood in your stool or on the toilet paper. If you underwent a bowel prep for your procedure, you may not have a normal bowel movement for a few days.  Please Note:  You might notice some irritation and congestion in your nose or some drainage.  This is from the oxygen used during your procedure.  There is no need for concern and it should clear up in a day or so.  SYMPTOMS TO REPORT IMMEDIATELY:  Following lower endoscopy (colonoscopy or flexible sigmoidoscopy):  Excessive amounts of blood in the stool  Significant tenderness or worsening of abdominal pains  Swelling of the abdomen that is new, acute  Fever of 100F or higher  Following upper endoscopy (EGD)  Vomiting of blood or coffee ground material  New chest pain or pain under the shoulder blades  Painful or persistently difficult swallowing  New shortness of breath  Black, tarry-looking  stools  For urgent or emergent issues, a gastroenterologist can be reached at any hour by calling [phone]. Do not use MyChart messaging for urgent concerns.   DIET:  We do recommend a small meal at first, but then you may proceed to your regular diet.  Drink plenty of fluids but you should avoid alcoholic beverages for 24 hours.  ACTIVITY:  You should plan to take it easy for the rest of today and you should NOT DRIVE or use heavy machinery until tomorrow (because of the sedation medicines used during the test).    FOLLOW UP: Our staff will call the number listed on your records the next business day following your procedure.  We will call around 7:15- 8:00 am to check on you and address any questions or concerns that you may have regarding the information given to you following your procedure. If we do not reach you, we will leave a message.     If any biopsies were taken you will be contacted by phone or by letter within the next 1-3 weeks.  Please call us  at [phone] if you have not heard about the biopsies in 3 weeks.   SIGNATURES/CONFIDENTIALITY: You and/or your care partner have signed paperwork which will be entered into your electronic medical record.  These signatures attest to the fact that that the information above on your After Visit Summary has been reviewed and is understood.  Full responsibility of the confidentiality of this discharge information lies with you and/or your care-partner.

## 2024-03-10 NOTE — Op Note (Signed)
 [facility] Patient Name: [name] Procedure Date: 03/10/2024 3:25 PM MRN: [id number] Endoscopist: Lajuan Pila , MD, [id number] Age: 68 Referring MD:  Date of Birth: 08/24/1956 Gender: Female Account #: [id number] Procedure:                Upper GI endoscopy Indications:              Recent diagnosis of liver cirrhosis with                            thrombocytopenia. EGD is being performed to screen                            for esophageal varices. Medicines:                Monitored Anesthesia Care Procedure:                Pre-Anesthesia Assessment:                           - Prior to the procedure, a History and Physical                            was performed, and patient medications and                            allergies were reviewed. The patient's tolerance of                            previous anesthesia was also reviewed. The risks                            and benefits of the procedure and the sedation                            options and risks were discussed with the patient.                            All questions were answered, and informed consent                            was obtained. Prior Anticoagulants: The patient has                            taken no anticoagulant or antiplatelet agents. ASA                            Grade Assessment: III - A patient with severe                            systemic disease. After reviewing the risks and                            benefits, the patient was deemed in satisfactory  condition to undergo the procedure.                           After obtaining informed consent, the endoscope was                            passed under direct vision. Throughout the                            procedure, the patient's blood pressure, pulse, and                            oxygen saturations were monitored continuously. The                            GIF [id number] #[id number] was introduced  through the                            mouth, and advanced to the second part of duodenum.                            The upper GI endoscopy was accomplished without                            difficulty. The patient tolerated the procedure                            well. Scope In: Scope Out: Findings:                 The examined esophagus was normal. No esophageal                            varices.                           Localized mild inflammation characterized by                            erythema was found in the gastric antrum. 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 Few biopsies were taken                            with a cold forceps for histology.                           Mild portal hypertensive gastropathy was found in                            the gastric fundus and in the gastric body. No                            fundal varices on retroflexed exam.  The examined duodenum was normal. Complications:            No immediate complications. Estimated Blood Loss:     Estimated blood loss: none. Impression:               - Mild Portal hypertensive gastropathy.                           - Mild antral gastritis. Recommendation:           - Patient has a contact number available for                            emergencies. The signs and symptoms of potential                            delayed complications were discussed with the                            patient. Return to normal activities tomorrow.                            Written discharge instructions were provided to the                            patient.                           - Resume previous diet.                           - Continue present medications. Avoid nonsteroidals                           - Await pathology results.                           - Repeat EGD in 2 years.                           - The findings and recommendations were discussed                             with the patient's family. Lajuan Pila, MD 03/10/2024 4:02:36 PM This report has been signed electronically.

## 2024-03-11 ENCOUNTER — Telehealth: Payer: Self-pay

## 2024-03-11 NOTE — Telephone Encounter (Signed)
 No answer after follow up call. Call went straight to voice mail. Unable to leave message due to voicemail being full.

## 2024-03-18 LAB — SURGICAL PATHOLOGY

## 2024-03-29 ENCOUNTER — Ambulatory Visit: Payer: Self-pay | Admitting: Gastroenterology

## 2024-03-30 ENCOUNTER — Inpatient Hospital Stay: Attending: Oncology

## 2024-03-30 DIAGNOSIS — C184 Malignant neoplasm of transverse colon: Secondary | ICD-10-CM | POA: Insufficient documentation

## 2024-04-01 ENCOUNTER — Inpatient Hospital Stay

## 2024-04-01 DIAGNOSIS — C184 Malignant neoplasm of transverse colon: Secondary | ICD-10-CM | POA: Diagnosis not present

## 2024-04-01 LAB — CBC WITH DIFFERENTIAL (CANCER CENTER ONLY)
Abs Immature Granulocytes: 0.01 10*3/uL (ref 0.00–0.07)
Basophils Absolute: 0 10*3/uL (ref 0.0–0.1)
Basophils Relative: 0 %
Eosinophils Absolute: 0.1 10*3/uL (ref 0.0–0.5)
Eosinophils Relative: 2 %
HCT: 33.8 % — ABNORMAL LOW (ref 36.0–46.0)
Hemoglobin: 11.8 g/dL — ABNORMAL LOW (ref 12.0–15.0)
Immature Granulocytes: 0 %
Lymphocytes Relative: 21 %
Lymphs Abs: 1 10*3/uL (ref 0.7–4.0)
MCH: 31.6 pg (ref 26.0–34.0)
MCHC: 34.9 g/dL (ref 30.0–36.0)
MCV: 90.6 fL (ref 80.0–100.0)
Monocytes Absolute: 0.6 10*3/uL (ref 0.1–1.0)
Monocytes Relative: 12 %
Neutro Abs: 2.9 10*3/uL (ref 1.7–7.7)
Neutrophils Relative %: 65 %
Platelet Count: 84 10*3/uL — ABNORMAL LOW (ref 150–400)
RBC: 3.73 MIL/uL — ABNORMAL LOW (ref 3.87–5.11)
RDW: 12.7 % (ref 11.5–15.5)
WBC Count: 4.5 10*3/uL (ref 4.0–10.5)
nRBC: 0 % (ref 0.0–0.2)

## 2024-04-02 DIAGNOSIS — F331 Major depressive disorder, recurrent, moderate: Secondary | ICD-10-CM | POA: Diagnosis not present

## 2024-04-02 DIAGNOSIS — M797 Fibromyalgia: Secondary | ICD-10-CM | POA: Diagnosis not present

## 2024-04-02 DIAGNOSIS — I872 Venous insufficiency (chronic) (peripheral): Secondary | ICD-10-CM | POA: Diagnosis not present

## 2024-04-02 DIAGNOSIS — R413 Other amnesia: Secondary | ICD-10-CM | POA: Diagnosis not present

## 2024-04-02 DIAGNOSIS — R7309 Other abnormal glucose: Secondary | ICD-10-CM | POA: Diagnosis not present

## 2024-04-02 DIAGNOSIS — R600 Localized edema: Secondary | ICD-10-CM | POA: Diagnosis not present

## 2024-04-02 DIAGNOSIS — Z6837 Body mass index (BMI) 37.0-37.9, adult: Secondary | ICD-10-CM | POA: Diagnosis not present

## 2024-04-02 DIAGNOSIS — E785 Hyperlipidemia, unspecified: Secondary | ICD-10-CM | POA: Diagnosis not present

## 2024-04-02 DIAGNOSIS — G43909 Migraine, unspecified, not intractable, without status migrainosus: Secondary | ICD-10-CM | POA: Diagnosis not present

## 2024-04-08 DIAGNOSIS — S81801A Unspecified open wound, right lower leg, initial encounter: Secondary | ICD-10-CM | POA: Diagnosis not present

## 2024-04-08 DIAGNOSIS — R600 Localized edema: Secondary | ICD-10-CM | POA: Diagnosis not present

## 2024-04-22 DIAGNOSIS — R404 Transient alteration of awareness: Secondary | ICD-10-CM | POA: Diagnosis not present

## 2024-04-22 DIAGNOSIS — G43809 Other migraine, not intractable, without status migrainosus: Secondary | ICD-10-CM | POA: Diagnosis not present

## 2024-04-22 DIAGNOSIS — R4189 Other symptoms and signs involving cognitive functions and awareness: Secondary | ICD-10-CM | POA: Diagnosis not present

## 2024-04-22 DIAGNOSIS — D61810 Antineoplastic chemotherapy induced pancytopenia: Secondary | ICD-10-CM | POA: Diagnosis not present

## 2024-04-22 DIAGNOSIS — G43009 Migraine without aura, not intractable, without status migrainosus: Secondary | ICD-10-CM | POA: Diagnosis not present

## 2024-04-30 ENCOUNTER — Encounter (HOSPITAL_BASED_OUTPATIENT_CLINIC_OR_DEPARTMENT_OTHER): Payer: Self-pay

## 2024-04-30 ENCOUNTER — Other Ambulatory Visit (HOSPITAL_BASED_OUTPATIENT_CLINIC_OR_DEPARTMENT_OTHER): Payer: Self-pay

## 2024-04-30 ENCOUNTER — Ambulatory Visit (HOSPITAL_BASED_OUTPATIENT_CLINIC_OR_DEPARTMENT_OTHER)
Admission: EM | Admit: 2024-04-30 | Discharge: 2024-04-30 | Disposition: A | Discharge status: Home / Self Care | Attending: Family Medicine | Admitting: Family Medicine

## 2024-04-30 DIAGNOSIS — T148XXA Other injury of unspecified body region, initial encounter: Secondary | ICD-10-CM | POA: Diagnosis not present

## 2024-04-30 DIAGNOSIS — L03115 Cellulitis of right lower limb: Secondary | ICD-10-CM | POA: Diagnosis not present

## 2024-04-30 DIAGNOSIS — L089 Local infection of the skin and subcutaneous tissue, unspecified: Secondary | ICD-10-CM | POA: Diagnosis not present

## 2024-04-30 MED ORDER — DOXYCYCLINE HYCLATE 100 MG PO CAPS
100.0000 mg | ORAL_CAPSULE | Freq: Two times a day (BID) | ORAL | 0 refills | Status: DC
  Filled 2024-04-30: qty 20, 10d supply, fill #0

## 2024-04-30 NOTE — Discharge Instructions (Addendum)
 Take the antibiotics as prescribed.  Monitor for worsening symptoms.  If your infection gets worse you need to go to the ER.  Otherwise follow-up with your doctor for recheck next week

## 2024-04-30 NOTE — ED Provider Notes (Signed)
 [facility]    CSN: [id number] Arrival date & time: 04/30/24  1719      History   Chief Complaint Chief Complaint  Patient presents with   Leg Wound    HPI [name] is a 68 y.o. female.   Presents for multiple wounds to right lower leg that has been worsening over last 2 weeks. Patient states extremely painful, draining, and foul odor when drainage active. Round sore that appears ulcerated approx 1 inch in diameter to lateral lower leg, wound to front of leg approx 1 1/4 inch in diameter. No fever, chills, body aches.      Past Medical History:  Diagnosis Date   Abnormal glucose    Achilles tendinitis of left lower extremity    Acute suppurative arthritis due to bacteria (HCC) 03/09/2011   Overview:  Last Assessment & Plan:  Methicillin sensitive staphylococcal aureus septic hip. She clearly does need I&D of the hip. I will have her continue the doxycycline  for now but stop it 7 days prior to her surgery to maximize the yield on the cultures in the operating room though I be shocked if we don't find methicillin sensitive staph aureus again.. I agree with removing as much of the pros   Anal fissure 11/30/2015   Arthritis    right hip   Atypical chest pain 04/09/2016   Bilateral edema of lower extremity    BMI 39.0-39.9,adult    Cancer [facility])    SKIN CANCER   Carpal tunnel syndrome 03/09/2011   Overview:  Last Assessment & Plan:  Clinically this seems to be carpal tunnel syndrome. Giving given her history of infection though we can keep in the back of our mind the idea that she will have a cervical spine problem but I think this is unlikely at this point in time medics carpal tunnel syndrome fits clinically this was a positive Tinel's sign however in for a brace for her.   Chronic vaginitis    Chronic venous insufficiency    Depression    Dyslipidemia    Fatigue 10/13/2015   Fibromyalgia    GERD without esophagitis    History of immune thrombocytopenia  03/09/2011   History of operative procedure on hip 03/10/2012   History of TTP (thrombotic thrombocytopenic purpura)    Hypokalemia    Lichen sclerosus et atrophicus of the vulva 11/30/2015   Major depressive disorder, recurrent, moderate (HCC)    Methicillin susceptible Staphylococcus aureus infection 03/09/2011   Overview:  Last Assessment & Plan:  This is undoubtedlythe culprit organism   Migraine    Morbid obesity with BMI of 45.0-49.9, adult (HCC) 10/13/2015   Morbidly obese (HCC)    MSSA (methicillin susceptible Staphylococcus aureus) infection    Near syncope    Osteoarthritis, chronic    Pain due to total hip replacement (HCC) 09/20/2011   Palpitations 03/04/2019   Postmenopausal    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 syndrome (HCC)    20 yrs ago-not seen anyone for 10 yrs.   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   Diverticulitis 05/30/2023  Delirium due to multiple etiologies 05/23/2023   Dehydration 05/21/2023   Hypotension 05/13/2023   Chemotherapy induced diarrhea 05/08/2023   Chemotherapy-induced nausea 05/08/2023   Mucositis due to antineoplastic therapy 05/06/2023   Chemotherapy follow-up examination 03/25/2023   Hx of migraine headaches 03/13/2023   Diffuse nodular cirrhosis of liver (HCC) 01/28/2023   Colon cancer (HCC) 01/15/2023   Postural dizziness with presyncope 01/15/2023   Diarrhea 01/15/2023   Syncope and collapse 07/25/2022   Obesity (BMI 35.0-39.9 without comorbidity) 07/25/2022   Shortness of breath 07/25/2022    Abnormal glucose 07/16/2022   Achilles tendinitis of left lower extremity 07/16/2022   Arthritis 07/16/2022   Bilateral edema of lower extremity 07/16/2022   BMI 39.0-39.9,adult 07/16/2022   Cancer (HCC) 07/16/2022   Chronic vaginitis 07/16/2022   Chronic venous insufficiency 07/16/2022   Depression 07/16/2022   Fibromyalgia 07/16/2022   GERD without esophagitis 07/16/2022   History of TTP (thrombotic thrombocytopenic purpura) 07/16/2022   Hypokalemia 07/16/2022   Major depressive disorder, recurrent, moderate (HCC) 07/16/2022   Morbidly obese (HCC) 07/16/2022   MSSA (methicillin susceptible Staphylococcus aureus) infection 07/16/2022   Near syncope 07/16/2022   Osteoarthritis, chronic 07/16/2022   Postmenopausal 07/16/2022   Vitamin D deficiency 07/16/2022   Palpitations 03/04/2019   Dyslipidemia 03/04/2019   Atypical chest pain 04/09/2016   Anal fissure 11/30/2015   Lichen sclerosus et atrophicus of the vulva 11/30/2015   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   Snoring 11/22/2015   Fatigue 10/13/2015   Morbid obesity with BMI of 45.0-49.9, adult (HCC) 10/13/2015   Simple partial seizure disorder (HCC) 08/18/2014   History of operative procedure on hip 03/10/2012   Pain due to total hip replacement (HCC) 09/20/2011   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 Mild diverticulosis. Small internal hemorrhoids. Healed anal fissure   ENDOVENOUS ABLATION SAPHENOUS VEIN W/ LASER Right 04/02/2019   endovenous laser ablation right greater saphenous vein by Lonni Blade MD    JOINT REPLACEMENT  08/2009   right hip replacement   REVISION TOTAL HIP ARTHROPLASTY      TONSILLECTOMY  1963   TOTAL HIP REVISION  10/08/2011   Procedure: TOTAL HIP REVISION;  Surgeon: Donnice JONETTA Car;  Location: [facility];  Service: Orthopedics;  Laterality: Right;  Resection of Right Total Hip/Extended Trochanteric Osteotomy/Placement of Antibiotic Total Hip Cemented by Depuy   TOTAL HIP REVISION  03/10/2012   Procedure: TOTAL HIP REVISION;  Surgeon: Donnice JONETTA Car, MD;  Location: [facility];  Service: Orthopedics;  Laterality: Right;  Reimplantation/Revision of a Right Total Hip and Removal of Cemented Implant    OB History   No obstetric history on file.      Home Medications    Prior to Admission medications   Medication Sig Start Date End Date Taking? Authorizing Provider  doxycycline  (VIBRAMYCIN ) 100 MG capsule Take 1 capsule (100 mg total) by mouth 2 (two) times daily. 04/30/24  Yes Jerrico Covello A, FNP  buPROPion (WELLBUTRIN XL) 150 MG 24 hr tablet Take 150 mg by mouth every evening. 07/11/22   [provider]  clobetasol cream (TEMOVATE) 0.05 % Apply 1 Application topically as needed. Patient not taking: Reported on 03/10/2024    [provider]  clotrimazole-betamethasone (LOTRISONE) cream Apply 1 Application topically 2 (two) times daily. 03/23/23   [provider]  divalproex (DEPAKOTE) 250 MG DR tablet Take 250 mg by mouth 3 (three) times daily. 01/19/23   [provider]  DULoxetine  (CYMBALTA ) 60 MG capsule Take 60 mg by mouth at bedtime.     [provider]  esomeprazole (NEXIUM) 40 MG capsule Take 40 mg by mouth daily. Pt states has been on for years 04/09/23   [provider]  Multiple Vitamin (MULTIVITAMIN WITH MINERALS) TABS tablet Take 1 tablet by mouth daily.    [provider]  nortriptyline (PAMELOR) 10 MG capsule Take 10 mg by mouth. 01/19/23   [provider]  penicillin  v potassium (VEETID) 500 MG tablet Take 1 tablet (500 mg total) by mouth 2 (two) times daily. 09/09/23   Bernie Guillermina BROCKS, MD   potassium chloride  SA (KLOR-CON  M) 20 MEQ tablet Take 3 tablet today, then start 1 tablet twice daily 05/09/23   Ribakove, Everett C, MD  SUMAtriptan (IMITREX) 25 MG tablet Take 25 mg by mouth. 01/19/23   [provider]  triamterene -hydrochlorothiazide (DYAZIDE) 37.5-25 MG capsule Take 1 capsule by mouth daily. 06/10/23   [provider]    Family History Family History  Problem Relation Age of Onset   Hypertension Mother    Hypertension Father    Alzheimer's disease Father    Prostate cancer Father    Hypertension Maternal Grandfather    Seizures Other    Colon cancer Neg Hx    Stomach cancer Neg Hx    Rectal cancer Neg Hx    Esophageal cancer Neg Hx     Social History Social History   Tobacco Use   Smoking status: Former    Current packs/day: 0.00    Average packs/day: 1.5 packs/day for 20.0 years (30.0 ttl pk-yrs)    Types: Cigarettes    Start date: 10/04/1970    Quit date: 10/04/1990    Years since quitting: 33.5   Smokeless tobacco: Former    Quit date: 03/09/1991  Vaping Use   Vaping status: Never Used  Substance Use Topics   Alcohol use: Yes    Comment: OCCASIONAL   Drug use: No     Allergies   Bactrim [sulfamethoxazole-trimethoprim] and Aloe vera   Review of Systems Review of Systems See HPI  Physical Exam Triage Vital Signs ED Triage Vitals  Encounter Vitals Group     BP 04/30/24 1729 109/73     Girls Systolic BP Percentile --      Girls Diastolic BP Percentile --      Boys Systolic BP Percentile --      Boys Diastolic BP Percentile --      Pulse Rate 04/30/24 1729 83     Resp 04/30/24 1729 20     Temp 04/30/24 1729 98.1 F (36.7 C)     Temp Source 04/30/24 1729 Oral     SpO2 04/30/24 1729 96 %     Weight --      Height --      Head Circumference --      Peak Flow --      Pain Score 04/30/24 1732 5     Pain Loc --      Pain Education --      Exclude from Growth Chart --    No data found.  Updated Vital Signs BP  109/73 (BP Location: Right Arm)   Pulse 83   Temp 98.1  F (36.7 C) (Oral)   Resp 20   LMP 04/05/2011   SpO2 96%   Visual Acuity Right Eye Distance:   Left Eye Distance:   Bilateral Distance:    Right Eye Near:   Left Eye Near:    Bilateral Near:     Physical Exam Vitals and nursing note reviewed.  Constitutional:      General: She is not in acute distress.    Appearance: Normal appearance. She is not ill-appearing, toxic-appearing or diaphoretic.  Pulmonary:     Effort: Pulmonary effort is normal.  Skin:    General: Skin is warm and dry.     Comments: See picture for detail. 2 ulcers to RLE with cellulitis.   Neurological:     Mental Status: She is alert.  Psychiatric:        Mood and Affect: Mood normal.         UC Treatments / Results  Labs (all labs ordered are listed, but only abnormal results are displayed) Labs Reviewed - No data to display  EKG   Radiology No results found.  Procedures Procedures (including critical care time)  Medications Ordered in UC Medications - No data to display  Initial Impression / Assessment and Plan / UC Course  I have reviewed the triage vital signs and the nursing notes.  Pertinent labs & imaging results that were available during my care of the patient were reviewed by me and considered in my medical decision making (see chart for details).     Wound infection with cellulitis-patient reports that she is allergic to Bactrim.  Will treat with doxycycline  at this time.  Recommend getting in with primary to follow-up with wound clinic for recheck and possible special dressings for these wounds.  Also recommended if redness or swelling worsens and she starts developing any fevers, chills, body aches or night sweats she will need to go to the ER.  Patient understanding and agreed to plan. Final Clinical Impressions(s) / UC Diagnoses   Final diagnoses:  Wound infection  Cellulitis of right lower extremity      Discharge Instructions      Take the antibiotics as prescribed.  Monitor for worsening symptoms.  If your infection gets worse you need to go to the ER.  Otherwise follow-up with your doctor for recheck next week    ED Prescriptions     Medication Sig Dispense Auth. Provider   doxycycline  (VIBRAMYCIN ) 100 MG capsule Take 1 capsule (100 mg total) by mouth 2 (two) times daily. 20 capsule Adah Wilbert LABOR, FNP      PDMP not reviewed this encounter.   Adah Wilbert LABOR, FNP 04/30/24 1850

## 2024-04-30 NOTE — ED Triage Notes (Signed)
 Presents for multiple wounds to right lower leg that has been worsening over last 2 weeks. Patient states extremely painful, draining, and foul odor when drainage active. Round sore that appears ulcerated approx 1 inch in diameter to lateral lower leg, wound to front of leg approx 1 1/4 inch in diameter

## 2024-05-04 ENCOUNTER — Inpatient Hospital Stay: Attending: Oncology

## 2024-05-04 DIAGNOSIS — C184 Malignant neoplasm of transverse colon: Secondary | ICD-10-CM | POA: Diagnosis not present

## 2024-05-04 LAB — CBC WITH DIFFERENTIAL (CANCER CENTER ONLY)
Abs Immature Granulocytes: 0.02 K/uL (ref 0.00–0.07)
Basophils Absolute: 0 K/uL (ref 0.0–0.1)
Basophils Relative: 1 %
Eosinophils Absolute: 0.1 K/uL (ref 0.0–0.5)
Eosinophils Relative: 2 %
HCT: 35.5 % — ABNORMAL LOW (ref 36.0–46.0)
Hemoglobin: 12.2 g/dL (ref 12.0–15.0)
Immature Granulocytes: 0 %
Lymphocytes Relative: 20 %
Lymphs Abs: 1 K/uL (ref 0.7–4.0)
MCH: 31.7 pg (ref 26.0–34.0)
MCHC: 34.4 g/dL (ref 30.0–36.0)
MCV: 92.2 fL (ref 80.0–100.0)
Monocytes Absolute: 0.5 K/uL (ref 0.1–1.0)
Monocytes Relative: 11 %
Neutro Abs: 3.3 K/uL (ref 1.7–7.7)
Neutrophils Relative %: 66 %
Platelet Count: 87 K/uL — ABNORMAL LOW (ref 150–400)
RBC: 3.85 MIL/uL — ABNORMAL LOW (ref 3.87–5.11)
RDW: 12.5 % (ref 11.5–15.5)
WBC Count: 5 K/uL (ref 4.0–10.5)
nRBC: 0 % (ref 0.0–0.2)

## 2024-05-05 DIAGNOSIS — L97912 Non-pressure chronic ulcer of unspecified part of right lower leg with fat layer exposed: Secondary | ICD-10-CM | POA: Diagnosis not present

## 2024-05-06 NOTE — Telephone Encounter (Signed)
 Discussed results with husband.  They have referral to [facility] center but won't be seen for 5-6 months.  He thinks pt is doing better off nortriptyline.  Rec healthy diet, exercise and socialization.  Waiting for EEG in August and keep appt with me in Sept.

## 2024-05-06 NOTE — Telephone Encounter (Signed)
-----   Message from [facility] sent at 05/04/2024  2:20 PM EDT -----  ----- Message ----- From: Tawni CHRISTELLA Nap Sent: 05/04/2024  12:18 PM EDT To: [facility] Clinical Support  ----- Message from Tawni CHRISTELLA Nap sent at 05/04/2024 12:18 PM EDT -----

## 2024-05-12 DIAGNOSIS — L97912 Non-pressure chronic ulcer of unspecified part of right lower leg with fat layer exposed: Secondary | ICD-10-CM | POA: Diagnosis not present

## 2024-05-18 ENCOUNTER — Emergency Department (HOSPITAL_COMMUNITY)

## 2024-05-18 ENCOUNTER — Other Ambulatory Visit: Payer: Self-pay

## 2024-05-18 ENCOUNTER — Inpatient Hospital Stay (HOSPITAL_COMMUNITY)

## 2024-05-18 ENCOUNTER — Observation Stay (HOSPITAL_COMMUNITY)
Admission: EM | Admit: 2024-05-18 | Discharge: 2024-05-20 | Disposition: A | Discharge status: Home / Self Care | Attending: Internal Medicine | Admitting: Internal Medicine

## 2024-05-18 ENCOUNTER — Encounter (HOSPITAL_COMMUNITY): Payer: Self-pay

## 2024-05-18 DIAGNOSIS — D696 Thrombocytopenia, unspecified: Secondary | ICD-10-CM | POA: Insufficient documentation

## 2024-05-18 DIAGNOSIS — R600 Localized edema: Secondary | ICD-10-CM

## 2024-05-18 DIAGNOSIS — R4189 Other symptoms and signs involving cognitive functions and awareness: Secondary | ICD-10-CM

## 2024-05-18 DIAGNOSIS — R4182 Altered mental status, unspecified: Secondary | ICD-10-CM | POA: Diagnosis not present

## 2024-05-18 DIAGNOSIS — R410 Disorientation, unspecified: Secondary | ICD-10-CM | POA: Diagnosis not present

## 2024-05-18 DIAGNOSIS — R0602 Shortness of breath: Secondary | ICD-10-CM | POA: Insufficient documentation

## 2024-05-18 DIAGNOSIS — R531 Weakness: Secondary | ICD-10-CM | POA: Diagnosis not present

## 2024-05-18 DIAGNOSIS — Z471 Aftercare following joint replacement surgery: Secondary | ICD-10-CM | POA: Diagnosis not present

## 2024-05-18 DIAGNOSIS — J321 Chronic frontal sinusitis: Secondary | ICD-10-CM

## 2024-05-18 DIAGNOSIS — R2689 Other abnormalities of gait and mobility: Secondary | ICD-10-CM | POA: Diagnosis not present

## 2024-05-18 DIAGNOSIS — R9089 Other abnormal findings on diagnostic imaging of central nervous system: Secondary | ICD-10-CM | POA: Diagnosis not present

## 2024-05-18 DIAGNOSIS — R29810 Facial weakness: Secondary | ICD-10-CM | POA: Diagnosis not present

## 2024-05-18 DIAGNOSIS — R270 Ataxia, unspecified: Secondary | ICD-10-CM | POA: Diagnosis not present

## 2024-05-18 DIAGNOSIS — I6501 Occlusion and stenosis of right vertebral artery: Secondary | ICD-10-CM | POA: Diagnosis not present

## 2024-05-18 DIAGNOSIS — Z79899 Other long term (current) drug therapy: Secondary | ICD-10-CM | POA: Insufficient documentation

## 2024-05-18 DIAGNOSIS — R9082 White matter disease, unspecified: Secondary | ICD-10-CM | POA: Diagnosis not present

## 2024-05-18 DIAGNOSIS — I6523 Occlusion and stenosis of bilateral carotid arteries: Secondary | ICD-10-CM | POA: Diagnosis not present

## 2024-05-18 DIAGNOSIS — R29818 Other symptoms and signs involving the nervous system: Secondary | ICD-10-CM | POA: Diagnosis not present

## 2024-05-18 LAB — COMPREHENSIVE METABOLIC PANEL WITH GFR
ALT: 14 U/L (ref 0–44)
AST: 20 U/L (ref 15–41)
Albumin: 3.6 g/dL (ref 3.5–5.0)
Alkaline Phosphatase: 63 U/L (ref 38–126)
Anion gap: 7 (ref 5–15)
BUN: 16 mg/dL (ref 8–23)
CO2: 27 mmol/L (ref 22–32)
Calcium: 9.6 mg/dL (ref 8.9–10.3)
Chloride: 107 mmol/L (ref 98–111)
Creatinine, Ser: 0.79 mg/dL (ref 0.44–1.00)
GFR, Estimated: >60 mL/min (ref >60)
Glucose, Bld: 98 mg/dL (ref 70–99)
Potassium: 4.1 mmol/L (ref 3.5–5.1)
Sodium: 141 mmol/L (ref 135–145)
Total Bilirubin: 0.6 mg/dL (ref 0.0–1.2)
Total Protein: 6.2 g/dL — ABNORMAL LOW (ref 6.5–8.1)

## 2024-05-18 LAB — PROTIME-INR
INR: 1 (ref 0.8–1.2)
Prothrombin Time: 13.9 s (ref 11.4–15.2)

## 2024-05-18 LAB — CBC
HCT: 39.2 % (ref 36.0–46.0)
HCT: 35.8 % — ABNORMAL LOW (ref 36.0–46.0)
Hemoglobin: 12.7 g/dL (ref 12.0–15.0)
Hemoglobin: 12.2 g/dL (ref 12.0–15.0)
MCH: 31.4 pg (ref 26.0–34.0)
MCH: 32 pg (ref 26.0–34.0)
MCHC: 32.4 g/dL (ref 30.0–36.0)
MCHC: 34.1 g/dL (ref 30.0–36.0)
MCV: 96.8 fL (ref 80.0–100.0)
MCV: 94 fL (ref 80.0–100.0)
Platelets: 93 K/uL — ABNORMAL LOW (ref 150–400)
Platelets: 84 K/uL — ABNORMAL LOW (ref 150–400)
RBC: 4.05 MIL/uL (ref 3.87–5.11)
RBC: 3.81 MIL/uL — ABNORMAL LOW (ref 3.87–5.11)
RDW: 12.2 % (ref 11.5–15.5)
RDW: 12.1 % (ref 11.5–15.5)
WBC: 4.8 K/uL (ref 4.0–10.5)
WBC: 4.4 K/uL (ref 4.0–10.5)
nRBC: 0 % (ref 0.0–0.2)
nRBC: 0 % (ref 0.0–0.2)

## 2024-05-18 LAB — LIPID PANEL
Cholesterol: 185 mg/dL (ref 0–200)
HDL: 61 mg/dL (ref >40)
LDL Cholesterol: 114 mg/dL — ABNORMAL HIGH (ref 0–99)
Total CHOL/HDL Ratio: 3 ratio
Triglycerides: 52 mg/dL (ref <150)
VLDL: 10 mg/dL (ref 0–40)

## 2024-05-18 LAB — URINALYSIS, ROUTINE W REFLEX MICROSCOPIC
Bilirubin Urine: NEGATIVE
Glucose, UA: NEGATIVE mg/dL
Hgb urine dipstick: NEGATIVE
Ketones, ur: NEGATIVE mg/dL
Leukocytes,Ua: NEGATIVE
Nitrite: NEGATIVE
Protein, ur: NEGATIVE mg/dL
Specific Gravity, Urine: 1.024 (ref 1.005–1.030)
pH: 5 (ref 5.0–8.0)

## 2024-05-18 LAB — LACTATE DEHYDROGENASE: LDH: 158 U/L (ref 98–192)

## 2024-05-18 LAB — I-STAT CG4 LACTIC ACID, ED: Lactic Acid, Venous: 0.6 mmol/L (ref 0.5–1.9)

## 2024-05-18 LAB — DIFFERENTIAL
Abs Immature Granulocytes: 0.02 K/uL (ref 0.00–0.07)
Basophils Absolute: 0 K/uL (ref 0.0–0.1)
Basophils Relative: 1 %
Eosinophils Absolute: 0.1 K/uL (ref 0.0–0.5)
Eosinophils Relative: 2 %
Immature Granulocytes: 1 %
Lymphocytes Relative: 24 %
Lymphs Abs: 1.1 K/uL (ref 0.7–4.0)
Monocytes Absolute: 0.6 K/uL (ref 0.1–1.0)
Monocytes Relative: 13 %
Neutro Abs: 2.6 K/uL (ref 1.7–7.7)
Neutrophils Relative %: 59 %

## 2024-05-18 LAB — TECHNOLOGIST SMEAR REVIEW: Plt Morphology: DECREASED

## 2024-05-18 LAB — TSH: TSH: 1.336 u[IU]/mL (ref 0.350–4.500)

## 2024-05-18 LAB — T4, FREE: Free T4: 0.75 ng/dL (ref 0.61–1.12)

## 2024-05-18 LAB — HIV ANTIBODY (ROUTINE TESTING W REFLEX): HIV Screen 4th Generation wRfx: NONREACTIVE

## 2024-05-18 LAB — ETHANOL: Alcohol, Ethyl (B): <15 mg/dL (ref <15)

## 2024-05-18 LAB — HEMOGLOBIN A1C
Hgb A1c MFr Bld: 4.1 % — ABNORMAL LOW (ref 4.8–5.6)
Mean Plasma Glucose: 70.97 mg/dL

## 2024-05-18 LAB — CBG MONITORING, ED: Glucose-Capillary: 105 mg/dL — ABNORMAL HIGH (ref 70–99)

## 2024-05-18 LAB — TROPONIN I (HIGH SENSITIVITY): Troponin I (High Sensitivity): <2 ng/L (ref <18)

## 2024-05-18 LAB — D-DIMER, QUANTITATIVE: D-Dimer, Quant: 0.56 ug{FEU}/mL — ABNORMAL HIGH (ref 0.00–0.50)

## 2024-05-18 LAB — VALPROIC ACID LEVEL: Valproic Acid Lvl: 71 ug/mL (ref 50–100)

## 2024-05-18 MED ORDER — BUPROPION HCL ER (XL) 150 MG PO TB24
150.0000 mg | ORAL_TABLET | Freq: Every evening | ORAL | Status: DC
Start: 2024-05-18 — End: 2024-05-20
  Administered 2024-05-18 – 2024-05-19 (×2): 150 mg via ORAL
  Filled 2024-05-18 (×2): qty 1

## 2024-05-18 MED ORDER — IOHEXOL 350 MG/ML SOLN
75.0000 mL | Freq: Once | INTRAVENOUS | Status: AC | PRN
  Administered 2024-05-18: 75 mL via INTRAVENOUS

## 2024-05-18 MED ORDER — SODIUM CHLORIDE 0.9 % IV SOLN: INTRAVENOUS | Status: AC

## 2024-05-18 MED ORDER — PANTOPRAZOLE SODIUM 40 MG IV SOLR
40.0000 mg | Freq: Two times a day (BID) | INTRAVENOUS | Status: DC
  Administered 2024-05-18: 40 mg via INTRAVENOUS
  Filled 2024-05-18: qty 10

## 2024-05-18 MED ORDER — ACETAMINOPHEN 650 MG RE SUPP: 650.0000 mg | Freq: Four times a day (QID) | RECTAL | Status: DC | PRN

## 2024-05-18 MED ORDER — SODIUM CHLORIDE 0.9% FLUSH
3.0000 mL | Freq: Two times a day (BID) | INTRAVENOUS | Status: DC
  Administered 2024-05-18 – 2024-05-20 (×4): 3 mL via INTRAVENOUS

## 2024-05-18 MED ORDER — ACETAMINOPHEN 325 MG PO TABS
650.0000 mg | ORAL_TABLET | Freq: Four times a day (QID) | ORAL | Status: DC | PRN
  Administered 2024-05-19: 650 mg via ORAL
  Filled 2024-05-18 (×2): qty 2

## 2024-05-18 MED ORDER — THIAMINE HCL 100 MG/ML IJ SOLN
500.0000 mg | Freq: Once | INTRAVENOUS | Status: AC
  Administered 2024-05-18: 500 mg via INTRAVENOUS
  Filled 2024-05-18: qty 5

## 2024-05-18 MED ORDER — DULOXETINE HCL 20 MG PO CPEP
20.0000 mg | ORAL_CAPSULE | Freq: Every day | ORAL | Status: DC
Start: 2024-05-18 — End: 2024-05-20
  Administered 2024-05-18 – 2024-05-19 (×2): 20 mg via ORAL
  Filled 2024-05-18 (×2): qty 1

## 2024-05-18 MED ORDER — DIVALPROEX SODIUM 250 MG PO DR TAB
250.0000 mg | DELAYED_RELEASE_TABLET | Freq: Three times a day (TID) | ORAL | Status: DC
  Administered 2024-05-18 – 2024-05-20 (×6): 250 mg via ORAL
  Filled 2024-05-18 (×6): qty 1

## 2024-05-18 NOTE — ED Triage Notes (Signed)
 Pt arrives with family with complaints of dizziness, weakness, lethargy, and intermittent confusion that started yesterday at 1500. Pt is supposed to have an EEG in August because of seizure like activity vs. TIA per husband. These episodes started with brain fog after her cancer treatment ended a year ago.

## 2024-05-18 NOTE — Consult Note (Signed)
 NEUROLOGY CONSULT NOTE   Date of service: May 18, 2024 Patient Name: [name] MRN:  [id number] DOB:  1956-02-18 Chief Complaint: Episode of fogginess in her head and off-balance Requesting Provider: Dasie Faden, MD  History of Present Illness  [name] is a 68 y.o. 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    Patient reports that last night she got up to walk her puppies and take them outside.  As she walked, she felt very off balance and she had to hold onto the walls.  On the way back, she had significant difficulty navigating the stairs.  Her husband had already gone to bed.  When she got up this morning, balance was improved.  However, she was feeling very foggy in her head and it was very difficult to focus.  She tells me that this episode is distinct and different than her episode of transient loss of awareness with pain in her head.  She reports that those episodes have been better controlled with Depakote .  She is already established with an outpatient neurologist and is scheduled for a 72-hour ambulatory EEG in August.  She eats a good mix of meat, vegetables and dairy.  She denies any fever.  No recent changes to her medication.  She is compliant with Depakote  and has not missed any doses.  She denies any history of strokes, she does not smoke, social alcohol use and does not binge drink, she does not use any recreational substances.  She endorses a history of right lower extremity cellulitis and pain.  She endorses that over the last several days, she has had slight increase in bilateral lower extremity edema.  She does not think that this has gotten worse over the last couple  days.  She takes a vitamin B complex daily.  They had reported right facial droop to EDP.  However, denied that on my evaluation today.  LKW: 1500 on 05/17/24. Modified rankin score: 0-Completely asymptomatic and back to baseline post- stroke IV Thrombolysis: Not offered, outside the window and too mild to treat.   EVT: Not offered, outside the window and too mild to treat and low suspicion for LVO.    NIHSS components Score: Comment  1a Level of Conscious 0[x]  1[]  2[]  3[]      1b LOC Questions 0[x]  1[]  2[]       1c LOC Commands 0[x]  1[]  2[]       2 Best Gaze 0[x]  1[]  2[]       3 Visual 0[x]  1[]  2[]  3[]      4 Facial Palsy 0[x]  1[]  2[]  3[]      5a Motor Arm - left 0[x]  1[]  2[]  3[]  4[]  UN[]    5b Motor Arm - Right 0[x]  1[]  2[]  3[]  4[]  UN[]    6a Motor Leg - Left 0[x]  1[]  2[]  3[]  4[]  UN[]    6b Motor Leg - Right 0[x]  1[]  2[]  3[]  4[]  UN[]    7 Limb Ataxia 0[x]  1[]  2[]  UN[]      8 Sensory 0[x]  1[]  2[]  UN[]      9 Best Language 0[x]  1[]  2[]  3[]      10 Dysarthria 0[x]  1[]  2[]  UN[]      11 Extinct. and Inattention 0[x]  1[]  2[]   TOTAL: 0      ROS  Comprehensive ROS performed and pertinent positives documented in HPI   Past History   Past Medical History:  Diagnosis Date   Abnormal glucose    Achilles tendinitis of left lower extremity    Acute suppurative arthritis due to bacteria (HCC) 03/09/2011   Overview:  Last Assessment & Plan:  Methicillin sensitive staphylococcal aureus septic hip. She clearly does need I&D of the hip. I will have her continue the doxycycline  for now but stop it 7 days prior to her surgery to maximize the yield on the cultures in the operating room though I be shocked if we don't find methicillin sensitive staph aureus again.. I agree with removing as much of the pros   Anal fissure 11/30/2015   Arthritis    right hip   Atypical chest pain 04/09/2016   Bilateral edema of lower extremity    BMI 39.0-39.9,adult    Cancer [facility])    SKIN CANCER   Carpal tunnel  syndrome 03/09/2011   Overview:  Last Assessment & Plan:  Clinically this seems to be carpal tunnel syndrome. Giving given her history of infection though we can keep in the back of our mind the idea that she will have a cervical spine problem but I think this is unlikely at this point in time medics carpal tunnel syndrome fits clinically this was a positive Tinel's sign however in for a brace for her.   Chronic vaginitis    Chronic venous insufficiency    Depression    Dyslipidemia    Fatigue 10/13/2015   Fibromyalgia    GERD without esophagitis    History of immune thrombocytopenia 03/09/2011   History of operative procedure on hip 03/10/2012   History of TTP (thrombotic thrombocytopenic purpura)    Hypokalemia    Lichen sclerosus et atrophicus of the vulva 11/30/2015   Major depressive disorder, recurrent, moderate (HCC)    Methicillin susceptible Staphylococcus aureus infection 03/09/2011   Overview:  Last Assessment & Plan:  This is undoubtedlythe culprit organism   Migraine    Morbid obesity with BMI of 45.0-49.9, adult (HCC) 10/13/2015   Morbidly obese (HCC)    MSSA (methicillin susceptible Staphylococcus aureus) infection    Near syncope    Osteoarthritis, chronic    Pain due to total hip replacement (HCC) 09/20/2011   Palpitations 03/04/2019   Postmenopausal    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 syndrome (HCC)    20 yrs ago-not seen anyone for 10 yrs.   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 Mild diverticulosis. Small internal hemorrhoids. Healed anal fissure   ENDOVENOUS ABLATION SAPHENOUS VEIN W/ LASER Right 04/02/2019   endovenous laser ablation right greater saphenous vein by Lonni Blade MD    JOINT REPLACEMENT  08/2009   right hip replacement    REVISION TOTAL HIP ARTHROPLASTY     TONSILLECTOMY  1963   TOTAL HIP REVISION  10/08/2011   Procedure: TOTAL HIP REVISION;  Surgeon: Donnice JONETTA Car;  Location: [facility];  Service: Orthopedics;  Laterality: Right;  Resection of Right Total Hip/Extended Trochanteric Osteotomy/Placement of Antibiotic Total Hip Cemented by Depuy   TOTAL HIP REVISION  03/10/2012   Procedure: TOTAL HIP REVISION;  Surgeon: Donnice JONETTA Car, MD;  Location: [facility];  Service: Orthopedics;  Laterality: Right;  Reimplantation/Revision of a Right Total Hip and Removal of Cemented Implant    Family History: Family History  Problem Relation Age of Onset   Hypertension Mother    Hypertension Father    Alzheimer's disease Father    Prostate cancer Father    Hypertension Maternal Grandfather    Seizures Other    Colon cancer Neg Hx    Stomach cancer Neg Hx    Rectal cancer Neg Hx    Esophageal cancer Neg Hx     Social History  reports that she quit smoking about 33 years ago. Her smoking use included cigarettes. She started smoking about 53 years ago. She has a 30 pack-year smoking history. She quit smokeless tobacco use about 33 years ago. She reports current alcohol use. She reports that she does not use drugs.  Allergies  Allergen Reactions   Bactrim [Sulfamethoxazole-Trimethoprim] Hives   Aloe Vera Other (See Comments)    Stuck onto skin per pt    Medications   Current Facility-Administered Medications:    thiamine  (VITAMIN B1) 500 mg in sodium chloride  0.9 % 50 mL IVPB, 500 mg, Intravenous, Once, Tobie Mario GAILS, MD, Last Rate: 110 mL/hr at 05/18/24 1612, 500 mg at 05/18/24 1612  Current Outpatient Medications:    buPROPion  (WELLBUTRIN  XL) 150 MG 24 hr tablet, Take 150 mg by mouth every evening., Disp: , Rfl:    divalproex  (DEPAKOTE ) 250 MG DR tablet, Take 250 mg by mouth in the morning, at noon, in the evening, and at bedtime., Disp: , Rfl:    DULoxetine  (CYMBALTA ) 60 MG capsule, Take 60 mg by mouth at  bedtime. , Disp: , Rfl:    esomeprazole (NEXIUM) 40 MG capsule, Take 40 mg by mouth daily. Pt states has been on for years, Disp: , Rfl:    Multiple Vitamin (MULTIVITAMIN WITH MINERALS) TABS tablet, Take 1 tablet by mouth daily., Disp: , Rfl:    potassium chloride  SA (KLOR-CON  M) 20 MEQ tablet, Take 3 tablet today, then start 1 tablet twice daily (Patient taking differently: Take 20 mEq by mouth 2 (two) times daily. Take 3 tablet today, then start 1 tablet twice daily), Disp: 64 tablet, Rfl: 0   SUMAtriptan (IMITREX) 25 MG tablet, Take 25 mg by mouth daily as needed for migraine., Disp: , Rfl:    triamterene -hydrochlorothiazide (DYAZIDE) 37.5-25 MG capsule, Take 1 capsule by mouth daily as needed (edema)., Disp: , Rfl:    doxycycline  (VIBRAMYCIN ) 100 MG capsule, Take 1 capsule (100 mg total) by mouth 2 (two) times daily. (Patient not taking: Reported on 05/18/2024), Disp: 20 capsule, Rfl: 0   nortriptyline (PAMELOR) 10 MG capsule, Take 10 mg by mouth. (Patient not taking: Reported on 05/18/2024), Disp: , Rfl:    penicillin  v potassium (VEETID) 500 MG tablet, Take 1 tablet (500 mg total) by mouth 2 (two) times daily. (Patient not taking: Reported on 05/18/2024), Disp: 180 tablet, Rfl: 3  Vitals   Vitals:   05/18/24 1228 05/18/24 1327 05/18/24 1400 05/18/24 1500  BP:   (!) 95/57 128/69  Pulse:   71 81  Resp:   18 18  Temp: 97.8 F (36.6 C)     TempSrc: Oral     SpO2:   100% 100%  Weight:  105.2 kg    Height:  5' 6 (1.676 m)      Body mass index is 37.45 kg/m.   Physical Exam   General: Laying comfortably in bed; in no acute  distress.  HENT: Normal oropharynx and mucosa. Normal external appearance of ears and nose.  Neck: Supple, no pain or tenderness  CV: No JVD. No peripheral edema.  Pulmonary: Symmetric Chest rise. Normal respiratory effort.  Abdomen: Soft to touch, non-tender.  Ext: No cyanosis, edema, or deformity  Skin: No rash. Normal palpation of skin.   Musculoskeletal:  Normal digits and nails by inspection. No clubbing.   Neurologic Examination  Mental status/Cognition: Alert, oriented to self, place, month and year, good attention.  Speech/language: Fluent, comprehension intact, object naming intact, repetition intact.  Cranial nerves:   CN II Pupils equal and reactive to light, no VF deficits    CN III,IV,VI EOM intact, no gaze preference or deviation, no nystagmus    CN V normal sensation in V1, V2, and V3 segments bilaterally    CN VII no asymmetry, no nasolabial fold flattening    CN VIII normal hearing to speech    CN IX & X normal palatal elevation, no uvular deviation    CN XI 5/5 head turn and 5/5 shoulder shrug bilaterally    CN XII midline tongue protrusion    Motor:  Muscle bulk: Normal, tone normal, pronator drift none tremor none Mvmt Root Nerve  Muscle Right Left Comments  SA C5/6 Ax Deltoid 5 5   EF C5/6 Mc Biceps 5 5   EE C6/7/8 Rad Triceps 5 5   WF C6/7 Med FCR     WE C7/8 PIN ECU     F Ab C8/T1 U ADM/FDI 5 5   HF L1/2/3 Fem Illopsoas 5 4+   KE L2/3/4 Fem Quad 5 5   DF L4/5 D Peron Tib Ant 5 5   PF S1/2 Tibial Grc/Sol 5 5    Reflexes:  Right Left Comments  Pectoralis      Biceps (C5/6) 2 2   Brachioradialis (C5/6) 2 2    Triceps (C6/7) 2 2    Patellar (L3/4) 2+ 2+    Achilles (S1) 1 1    Hoffman      Plantar Withdraws Withdraws   Jaw jerk    Sensation:  Light touch Intact throughout   Pin prick    Temperature    Vibration   Proprioception    Coordination/Complex Motor:  - Finger to Nose intact bilaterally - Heel to shin intact bilaterally - Rapid alternating movement are normal - Gait: Deferred for patient safety. Labs/Imaging/Neurodiagnostic studies   CBC:  Recent Labs  Lab 2024-06-03 1222  WBC 4.8  HGB 12.7  HCT 39.2  MCV 96.8  PLT 93*   Basic Metabolic Panel:  Lab Results  Component Value Date   NA 141 Jun 03, 2024   K 4.1 2024-06-03   CO2 27 June 03, 2024   GLUCOSE 98 03-Jun-2024   BUN 16  06-03-24   CREATININE 0.79 06-03-24   CALCIUM 9.6 06/03/2024   GFRNONAA >60 2024/06/03   GFRAA >90 03/12/2012   Lipid Panel:  Lab Results  Component Value Date   LDLCALC 114 (H) 06-03-24   HgbA1c:  Lab Results  Component Value Date   HGBA1C 4.1 (L) June 03, 2024   Urine Drug Screen: No results found for: LABOPIA, COCAINSCRNUR, LABBENZ, AMPHETMU, THCU, LABBARB  Alcohol Level No results found for: [facility] INR  Lab Results  Component Value Date   INR 1.01 03/04/2012   APTT  Lab Results  Component Value Date   APTT 33 03/04/2012   AED levels: No results found for: PHENYTOIN, ZONISAMIDE, LAMOTRIGINE, LEVETIRACETA  CT Head without  contrast(Personally reviewed): CTH was negative for a large hypodensity concerning for a large territory infarct or hyperdensity concerning for an ICH  CT angio Head and Neck with contrast(Personally reviewed): No LVO.  MRI Brain(Personally reviewed): Pending  ASSESSMENT   [name] is a 68 y.o. 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  Exam is notable for bilateral lower extremity edema, weak left hip flexion with the rest of the motor exam being intact. No facial droop, patient or her husband did not endorse any facial droop over the last couple of days.  Patient not aware of L hip flexion weakness prior to my exam. It maybe possible that this is a new finding and would explain her hesitancy with walking and feeling off balance yesterday? Isolated L hip flexion weakness without any sensory deficit is less likely to originate from neuropathy or myelopathy. Will get an MRI Brain without contrast for further  evaluation.  RECOMMENDATIONS  - MRI Brain without contrast. ______________________________________________________________________  Plan discussed with patient, her husband at the bedside.  Will reach out to EDP Dr. Nada to discuss plan in detail.  Signed, Matilde Pottenger, MD [facility]

## 2024-05-18 NOTE — ED Provider Notes (Signed)
 [facility] Provider Note   CSN: [id number] Arrival date & time: 05/18/24  1147     Patient presents with: Weakness and Shortness of Breath   [name] is a 68 y.o. female with past medical history significant for colon cancer in remission, TTP with known chronic thrombocytopenia, transient alteration of awareness followed by neurology in the outpatient setting with planned ambulatory EEG on Depakote  twice daily who presents emergency department for lethargy and difficulty with ambulation.  Patient is accompanied by her husband who does help provide medical history.  Patient husband states that last known normal was approximately 3 PM on 7/27.  Patient does endorse that she has had intermittent episodes of inability to ambulate secondary to symptoms described as ataxia with needing to hold onto the walls to ambulate.  Patient states that this a.m. she woke up and noticed right-sided facial droop, continued ataxia and stated she was lethargic.  Patient states that her symptoms have completely resolved and denies associated chest pain, shortness of breath, fever, syncope.    Weakness Associated symptoms: shortness of breath   Shortness of Breath      Prior to Admission medications   Medication Sig Start Date End Date Taking? Authorizing Provider  buPROPion  (WELLBUTRIN  XL) 150 MG 24 hr tablet Take 150 mg by mouth every evening. 07/11/22  Yes [provider]  divalproex  (DEPAKOTE ) 250 MG DR tablet Take 250 mg by mouth in the morning, at noon, in the evening, and at bedtime. 01/19/23  Yes [provider]  DULoxetine  (CYMBALTA ) 60 MG capsule Take 60 mg by mouth at bedtime.    Yes [provider]  esomeprazole (NEXIUM) 40 MG capsule Take 40 mg by mouth daily. Pt states has been on for years 04/09/23  Yes [provider]  Multiple Vitamin (MULTIVITAMIN WITH MINERALS) TABS tablet Take 1 tablet by mouth daily.   Yes  [provider]  potassium chloride  SA (KLOR-CON  M) 20 MEQ tablet Take 3 tablet today, then start 1 tablet twice daily Patient taking differently: Take 20 mEq by mouth 2 (two) times daily. Take 3 tablet today, then start 1 tablet twice daily 05/09/23  Yes Ribakove, Everett C, MD  SUMAtriptan (IMITREX) 25 MG tablet Take 25 mg by mouth daily as needed for migraine. 01/19/23  Yes [provider]  triamterene -hydrochlorothiazide (DYAZIDE) 37.5-25 MG capsule Take 1 capsule by mouth daily as needed (edema). 06/10/23  Yes [provider]  doxycycline  (VIBRAMYCIN ) 100 MG capsule Take 1 capsule (100 mg total) by mouth 2 (two) times daily. Patient not taking: Reported on 05/18/2024 04/30/24   Adah Corning A, FNP  nortriptyline (PAMELOR) 10 MG capsule Take 10 mg by mouth. Patient not taking: Reported on 05/18/2024 01/19/23   [provider]  penicillin  v potassium (VEETID) 500 MG tablet Take 1 tablet (500 mg total) by mouth 2 (two) times daily. Patient not taking: Reported on 05/18/2024 09/09/23   Bernie Guillermina BROCKS, MD    Allergies: Bactrim [sulfamethoxazole-trimethoprim] and Aloe vera    Review of Systems  Respiratory:  Positive for shortness of breath.   Neurological:  Positive for weakness.  Reviewed in HPI  Updated Vital Signs BP 128/69   Pulse 81   Temp 97.8 F (36.6 C) (Oral)   Resp 18   Ht 5' 6 (1.676 m)   Wt 105.2 kg   LMP 04/05/2011   SpO2 100%   BMI 37.45 kg/m   Physical Exam Vitals and nursing  note reviewed.  Constitutional:      General: She is not in acute distress. Eyes:     General: No visual field deficit. Cardiovascular:     Rate and Rhythm: Normal rate and regular rhythm.     Pulses:          Radial pulses are 2+ on the right side and 2+ on the left side.     Heart sounds: Normal heart sounds. No murmur heard. Pulmonary:     Effort: Pulmonary effort is normal. No tachypnea.     Breath sounds: Normal breath sounds. No wheezing, rhonchi  or rales.  Skin:    Comments: Right lower extremity wounds with bandages that are clean and dry  Neurological:     Mental Status: She is alert and oriented to person, place, and time. Mental status is at baseline.     Cranial Nerves: Cranial nerves 2-12 are intact. No cranial nerve deficit, dysarthria or facial asymmetry.     Sensory: Sensation is intact. No sensory deficit.     Motor: Motor function is intact. No weakness, abnormal muscle tone, seizure activity or pronator drift.     Coordination: Finger-Nose-Finger Test normal.     Comments: Per husband, patient has memory deficits at baseline.     (all labs ordered are listed, but only abnormal results are displayed) Labs Reviewed  COMPREHENSIVE METABOLIC PANEL WITH GFR - Abnormal; Notable for the following components:      Result Value   Total Protein 6.2 (*)    All other components within normal limits  CBC - Abnormal; Notable for the following components:   Platelets 93 (*)    All other components within normal limits  LIPID PANEL - Abnormal; Notable for the following components:   LDL Cholesterol 114 (*)    All other components within normal limits  HEMOGLOBIN A1C - Abnormal; Notable for the following components:   Hgb A1c MFr Bld 4.1 (*)    All other components within normal limits  CBG MONITORING, ED - Abnormal; Notable for the following components:   Glucose-Capillary 105 (*)    All other components within normal limits  URINALYSIS, ROUTINE W REFLEX MICROSCOPIC  TSH  T4, FREE  VALPROIC ACID  LEVEL  ETHANOL    EKG: EKG Interpretation Date/Time:  Monday May 18 2024 12:02:14 EDT Ventricular Rate:  76 PR Interval:  172 QRS Duration:  100 QT Interval:  376 QTC Calculation: 423 R Axis:   -50  Text Interpretation: Normal sinus rhythm Left anterior fascicular block Possible Anterolateral infarct , age undetermined Abnormal ECG When compared with ECG of 09-Oct-2011 17:57, PREVIOUS ECG IS PRESENT No significant change  since last tracing Confirmed by Dasie Faden ([id number]) on 05/18/2024 1:00:29 PM  Radiology: CT ANGIO HEAD NECK W WO CM Result Date: 05/18/2024 CLINICAL DATA:  Neuro deficit, acute, stroke suspected reported ataxia and facial droop EXAM: CT ANGIOGRAPHY HEAD AND NECK WITH CONTRAST TECHNIQUE: Multidetector CT imaging of the head and neck was performed using the standard protocol during bolus administration of intravenous contrast. Multiplanar CT image reconstructions and MIPs were obtained to evaluate the vascular anatomy. Carotid stenosis measurements (when applicable) are obtained utilizing NASCET criteria, using the distal internal carotid diameter as the denominator. RADIATION DOSE REDUCTION: This exam was performed according to the departmental dose-optimization program which includes automated exposure control, adjustment of the mA and/or kV according to patient size and/or use of iterative reconstruction technique. CONTRAST:  75mL OMNIPAQUE  IOHEXOL  350 MG/ML SOLN COMPARISON:  CT of  the head dated May 18, 2024. FINDINGS: CTA NECK FINDINGS Aortic arch: Standard branching. Imaged portion shows no evidence of aneurysm or dissection. No significant stenosis of the major arch vessel origins. Right carotid system: The common carotid arteries normal in caliber. There is calcific plaque present in the posterior wall of the proximal internal carotid artery, with less than 10% luminal stenosis. Left carotid system: The common carotid artery is normal in caliber and unremarkable. There is calcific plaque within the origin of the internal carotid artery, but no associated stenosis. Vertebral arteries: The vertebral arteries are codominant and normal in caliber. Skeleton: Mild-to-moderate degenerative disc disease and facet arthrosis. Other neck: Negative. Upper chest: Clear Review of the MIP images confirms the above findings CTA HEAD FINDINGS Anterior circulation: No significant stenosis, proximal occlusion, aneurysm,  or vascular malformation. Posterior circulation: There is fetal type origin of the right posterior cerebral artery. There is mild stenosis of the V4 segment of the right vertebral artery. The basilar artery is normal in caliber. Posterior cerebral arteries and the cerebellar arteries are patent and normal in caliber. Venous sinuses: Patent. Anatomic variants: Fetal type origin of the right posterior cerebral artery. Review of the MIP images confirms the above findings IMPRESSION: 1. Mild calcific plaque within the proximal internal carotid arteries with less than 10% stenosis bilaterally 2. Essentially negative CT angiogram of the head. Electronically Signed   By: Evalene Coho M.D.   On: 05/18/2024 14:55   CT HEAD WO CONTRAST Result Date: 05/18/2024 CLINICAL DATA:  confusion EXAM: CT HEAD WITHOUT CONTRAST TECHNIQUE: Contiguous axial images were obtained from the base of the skull through the vertex without intravenous contrast. RADIATION DOSE REDUCTION: This exam was performed according to the departmental dose-optimization program which includes automated exposure control, adjustment of the mA and/or kV according to patient size and/or use of iterative reconstruction technique. COMPARISON:  May 23, 2023 FINDINGS: Brain: The ventricles appear age appropriate. No mass effect or midline shift. Gray-white differentiation is preserved.Scattered periventricular white matter hypoattenuation, most consistent with changes of mild chronic ischemic microvascular disease.No evidence of acute territorial infarction, extra-axial fluid collection, hemorrhage, or mass lesion. The basilar cisterns are patent without downward herniation. The cerebellar hemispheres and vermis are well formed without mass lesion or focal attenuation abnormality. Vascular: No hyperdense vessel. Calcified atherosclerotic plaque within the cavernous/supraclinoid internal carotid arteries. Skull: Normal. Negative for fracture or focal lesion.  Sinuses/Orbits: Near-complete opacification of the right sphenoid sinus containing hyperdense material. Subtle thickening of the lateral sphenoid wall. The globes appear intact. No retrobulbar hematoma.Bilateral lens replacement. Other: None. IMPRESSION: 1. No acute intracranial abnormality, specifically, no acute hemorrhage, territorial infarction, or intracranial mass. 2. Near complete opacification of the right sphenoid sinus, which contains hyperdense material. This is worrisome for chronic fungal sinusitis and nonemergent follow-up with ENT should be considered. Electronically Signed   By: Rogelia Myers M.D.   On: 05/18/2024 14:15     Procedures   Medications Ordered in the ED  thiamine  (VITAMIN B1) 500 mg in sodium chloride  0.9 % 50 mL IVPB (has no administration in time range)  iohexol  (OMNIPAQUE ) 350 MG/ML injection 75 mL (75 mLs Intravenous Contrast Given 05/18/24 1425)    Clinical Course as of 05/18/24 1611  Mon May 18, 2024  1324 CT head reviewed by me: No evidence of acute ICH or mass effect [AG]  1510 Consult med ordered [AG]  1522 Neuro consult order placed  [AG]    Clinical Course User Index [AG] Nada Chroman, DO  Medical Decision Making Amount and/or Complexity of Data Reviewed Labs: ordered. Radiology: ordered.  Risk Prescription drug management. Decision regarding hospitalization.   On initial valuation patient is hemodynamically stable, afebrile and not in acute distress.  Based upon patient's history and physical examination findings, differential diagnosis include CVA/TIA, seizure-like activity, Todd's paralysis, central versus peripheral vertigo, meningitis/encephalitis.  Based upon patient's history and physical examination findings, complete neurologic exam performed without evidence of acute neurologic deficits.  However, based upon patient's history will obtain CT noncontrast head and CTA head and neck with concern for CVA  versus TIA with laboratory studies.  CT imaging reviewed without evidence of acute intracranial abnormality.  Patient's presentation less likely meningitis/encephalitis as patient is afebrile, hemodynamically stable without meningeal signs on examination and no acute focal neurologic deficits.  Neurology consulted and will evaluate the patient at bedside.  At this time, cannot rule out TIA therefore we will admit patient to hospitalist for continued care and management     Final diagnoses:  Ataxia  Facial droop    ED Discharge Orders     None      Lavanda Bolster DO  Emergency Medicine PGY2    Bolster Lavanda, DO 05/18/24 1611    Dasie Faden, MD 05/19/24 1009

## 2024-05-18 NOTE — H&P (Addendum)
 History and Physical    Patient: [name] FMW:[id number] DOB: [birth year] DOA: 05/18/2024 DOS: the patient was seen and examined on 05/18/2024 . PCP: Erick Greig LABOR, NP  Patient coming from: Home Chief complaint: Chief Complaint  Patient presents with   Weakness   Shortness of Breath   HPI:  [name] is a 68 y.o. female with past medical history  of allergy to Bactrim and aloe vera,, history of right hip replacement status post multiple revisions joint implant failure and superior to arthritis, carpal tunnel syndrome, history of ITP, history of TTP, history of HUS, obesity with a BMI of 37.45 A1c of 4.1, colon cancer/history of chemotherapy presents today with generalized weakness Yesterday pm she was trying to check on swimming pool,and was checking on her puppies. Dizzy and disoriented and weak and walking. Occasional alcohol.  Patient at bedside has easily arousable oriented but somnolent and intermittently falls asleep as if she has sleep apnea or sleep disorder.  No specific weakness falls incontinence chest pain palpitation or any other symptoms.  ED Course:  Vital signs in the ED were notable for the following:  Vitals:   05/18/24 1400 05/18/24 1500 05/18/24 1729 05/18/24 1900  BP: (!) 95/57 128/69  (!) 108/56  Pulse: 71 81  75  Temp:   (!) 97.5 F (36.4 C)   Resp: 18 18    Height:      Weight:      SpO2: 100% 100%  100%  TempSrc:   Oral   BMI (Calculated):      >>ED evaluation thus far shows: Vitals since arrival show soft blood pressures afebrile O2 sat of 100% on room air weight today of 1 of 5.2 and a BMI of 37.45. CMP shows normal electrolytes normal kidney function normal LFTs. CBC shows white count of 4.8 hemoglobin of 12.7 and platelets of 93. 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 CTA is negative. MRI is ordered and pending. 2D echocardiogram is ordered and pending.  >>While in the ED patient received the following: Medications  iohexol  (OMNIPAQUE ) 350 MG/ML injection 75 mL (75 mLs Intravenous Contrast Given 05/18/24 1425)  thiamine  (VITAMIN B1) 500 mg in sodium chloride  0.9 % 50 mL IVPB (500 mg Intravenous New Bag/Given 05/18/24 1612)   Review of Systems  Constitutional:  Positive for malaise/fatigue.  Neurological:  Positive for dizziness and weakness.  All other systems reviewed and are negative.  Past Medical History:  Diagnosis Date   Abnormal glucose    Achilles tendinitis of left lower extremity    Acute suppurative arthritis due to bacteria (HCC) 03/09/2011   Overview:  Last Assessment & Plan:  Methicillin sensitive staphylococcal aureus septic hip. She clearly does need I&D of the hip. I will have her continue the doxycycline  for now but stop it 7 days prior to her surgery to maximize the yield on the cultures in the operating room though I be shocked if we don't find methicillin sensitive staph aureus again.. I agree with removing as much of the pros   Anal fissure 11/30/2015   Arthritis    right hip   Atypical chest pain 04/09/2016   Bilateral edema of lower extremity    BMI 39.0-39.9,adult    Cancer (HCC)  SKIN CANCER   Carpal tunnel syndrome 03/09/2011   Overview:  Last Assessment & Plan:  Clinically this seems to be carpal tunnel syndrome. Giving given her history of infection though we can keep in the back of our mind the idea that she will have a cervical spine problem but I think this is unlikely at this point in time medics carpal tunnel syndrome fits clinically this was a positive Tinel's sign however in for a brace for her.   Chronic vaginitis    Chronic venous insufficiency    Depression     Dyslipidemia    Fatigue 10/13/2015   Fibromyalgia    GERD without esophagitis    History of immune thrombocytopenia 03/09/2011   History of operative procedure on hip 03/10/2012   History of TTP (thrombotic thrombocytopenic purpura)    Hypokalemia    Lichen sclerosus et atrophicus of the vulva 11/30/2015   Major depressive disorder, recurrent, moderate (HCC)    Methicillin susceptible Staphylococcus aureus infection 03/09/2011   Overview:  Last Assessment & Plan:  This is undoubtedlythe culprit organism   Migraine    Morbid obesity with BMI of 45.0-49.9, adult (HCC) 10/13/2015   Morbidly obese (HCC)    MSSA (methicillin susceptible Staphylococcus aureus) infection    Near syncope    Osteoarthritis, chronic    Pain due to total hip replacement (HCC) 09/20/2011   Palpitations 03/04/2019   Postmenopausal    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 syndrome (HCC)    20 yrs ago-not seen anyone for 10 yrs.   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 Mild diverticulosis. Small internal hemorrhoids. Healed anal fissure   ENDOVENOUS ABLATION SAPHENOUS VEIN W/ LASER Right 04/02/2019   endovenous laser ablation right greater saphenous vein by Lonni Blade MD    JOINT REPLACEMENT  08/2009   right hip replacement   REVISION TOTAL HIP ARTHROPLASTY     TONSILLECTOMY  1963   TOTAL HIP REVISION  10/08/2011   Procedure: TOTAL HIP REVISION;  Surgeon: Donnice JONETTA Car;  Location: [facility];  Service: Orthopedics;  Laterality: Right;  Resection of Right Total Hip/Extended Trochanteric Osteotomy/Placement of Antibiotic Total Hip Cemented by Depuy   TOTAL HIP REVISION  03/10/2012   Procedure: TOTAL HIP REVISION;  Surgeon: Donnice JONETTA Car, MD;  Location: [facility];  Service: Orthopedics;  Laterality:  Right;  Reimplantation/Revision of a Right Total Hip and Removal of Cemented Implant    reports that she quit smoking about 33 years ago. Her smoking use included cigarettes. She started smoking about 53 years ago. She has a 30 pack-year smoking history. She quit smokeless tobacco use about 33 years ago. She reports current alcohol use. She reports that she does not use drugs. Allergies  Allergen Reactions   Bactrim [Sulfamethoxazole-Trimethoprim] Hives   Aloe Vera Other (See Comments)    Stuck onto skin per pt   Family History  Problem Relation Age of Onset   Hypertension Mother    Hypertension Father    Alzheimer's disease Father    Prostate cancer Father    Hypertension Maternal Grandfather    Seizures Other    Colon cancer Neg Hx    Stomach cancer Neg Hx    Rectal cancer Neg Hx    Esophageal cancer Neg Hx  Prior to Admission medications   Medication Sig Start Date End Date Taking? Authorizing Provider  buPROPion  (WELLBUTRIN  XL) 150 MG 24 hr tablet Take 150 mg by mouth every evening. 07/11/22  Yes [provider]  divalproex  (DEPAKOTE ) 250 MG DR tablet Take 250 mg by mouth in the morning, at noon, in the evening, and at bedtime. 01/19/23  Yes [provider]  DULoxetine  (CYMBALTA ) 60 MG capsule Take 60 mg by mouth at bedtime.    Yes [provider]  esomeprazole (NEXIUM) 40 MG capsule Take 40 mg by mouth daily. Pt states has been on for years 04/09/23  Yes [provider]  Multiple Vitamin (MULTIVITAMIN WITH MINERALS) TABS tablet Take 1 tablet by mouth daily.   Yes [provider]  potassium chloride  SA (KLOR-CON  M) 20 MEQ tablet Take 3 tablet today, then start 1 tablet twice daily Patient taking differently: Take 20 mEq by mouth 2 (two) times daily. Take 3 tablet today, then start 1 tablet twice daily 05/09/23  Yes Ribakove, Everett C, MD  SUMAtriptan (IMITREX) 25 MG tablet Take 25 mg by mouth daily as needed for migraine. 01/19/23  Yes  [provider]  triamterene -hydrochlorothiazide (DYAZIDE) 37.5-25 MG capsule Take 1 capsule by mouth daily as needed (edema). 06/10/23  Yes [provider]  doxycycline  (VIBRAMYCIN ) 100 MG capsule Take 1 capsule (100 mg total) by mouth 2 (two) times daily. Patient not taking: Reported on 05/18/2024 04/30/24   Adah Corning A, FNP  nortriptyline (PAMELOR) 10 MG capsule Take 10 mg by mouth. Patient not taking: Reported on 05/18/2024 01/19/23   [provider]  penicillin  v potassium (VEETID) 500 MG tablet Take 1 tablet (500 mg total) by mouth 2 (two) times daily. Patient not taking: Reported on 05/18/2024 09/09/23   Bernie Guillermina BROCKS, MD                                                                                 Vitals:   05/18/24 1400 05/18/24 1500 05/18/24 1729 05/18/24 1900  BP: (!) 95/57 128/69  (!) 108/56  Pulse: 71 81  75  Resp: 18 18    Temp:   (!) 97.5 F (36.4 C)   TempSrc:   Oral   SpO2: 100% 100%  100%  Weight:      Height:       Physical Exam Vitals and nursing note reviewed.  Constitutional:      General: She is not in acute distress.    Appearance: She is not ill-appearing.  HENT:     Head: Normocephalic and atraumatic.     Right Ear: Hearing and external ear normal.     Left Ear: Hearing and external ear normal.     Nose: No nasal deformity.     Mouth/Throat:     Lips: Pink.  Eyes:     General: Lids are normal.     Extraocular Movements: Extraocular movements intact.     Pupils: Pupils are equal, round, and reactive to light.  Cardiovascular:     Rate and Rhythm: Normal rate and regular rhythm.     Pulses: Normal pulses.     Heart sounds: Normal heart sounds.  Pulmonary:     Effort: Pulmonary effort is normal.     Breath sounds: Normal breath sounds.  Abdominal:     General: Bowel sounds are normal. There is no distension.     Palpations: Abdomen is soft. There is no mass.     Tenderness: There is no abdominal tenderness.   Musculoskeletal:     Right lower leg: No edema.     Left lower leg: No edema.  Skin:    General: Skin is warm.  Neurological:     General: No focal deficit present.     Mental Status: She is alert and oriented to person, place, and time.     Cranial Nerves: Cranial nerves 2-12 are intact. No cranial nerve deficit, dysarthria or facial asymmetry.     Motor: Motor function is intact. No weakness.     Coordination: Finger-Nose-Finger Test and Heel to [place] Test normal.     Deep Tendon Reflexes:     Reflex Scores:      Bicep reflexes are 1+ on the right side and 1+ on the left side.      Patellar reflexes are 1+ on the right side and 1+ on the left side.    Comments: Somnolent.   Psychiatric:        Speech: Speech normal.     Labs on Admission: I have personally reviewed following labs and imaging studies CBC: Recent Labs  Lab 05/18/24 1222 05/18/24 1710  WBC 4.8 4.4  NEUTROABS  --  2.6  HGB 12.7 12.2  HCT 39.2 35.8*  MCV 96.8 94.0  PLT 93* 84*   Basic Metabolic Panel: Recent Labs  Lab 05/18/24 1222  NA 141  K 4.1  CL 107  CO2 27  GLUCOSE 98  BUN 16  CREATININE 0.79  CALCIUM 9.6   GFR: Estimated Creatinine Clearance: 83.7 mL/min (by C-G formula based on SCr of 0.79 mg/dL). Liver Function Tests: Recent Labs  Lab 05/18/24 1222  AST 20  ALT 14  ALKPHOS 63  BILITOT 0.6  PROT 6.2*  ALBUMIN  3.6   No results for input(s): LIPASE, AMYLASE in the last 168 hours. No results for input(s): AMMONIA in the last 168 hours. Coagulation Profile: Recent Labs  Lab 05/18/24 1710  INR 1.0   Cardiac Enzymes: No results for input(s): CKTOTAL, CKMB, CKMBINDEX, TROPONINI in the last 168 hours. BNP (last 3 results) No results for input(s): PROBNP in the last 8760 hours. HbA1C: Recent Labs    05/18/24 1326  HGBA1C 4.1*   CBG: Recent Labs  Lab 05/18/24 1226  GLUCAP 105*   Lipid Profile: Recent Labs    05/18/24 1326  CHOL 185  HDL 61  LDLCALC  114*  TRIG 52  CHOLHDL 3.0   Thyroid Function Tests: Recent Labs    05/18/24 1222 05/18/24 1326  TSH  --  1.336  FREET4 0.75  --    Anemia Panel: No results for input(s): VITAMINB12, FOLATE, FERRITIN, TIBC, IRON , RETICCTPCT in the last 72 hours. Urine analysis:    Component Value Date/Time   COLORURINE YELLOW 05/18/2024 1249   APPEARANCEUR CLEAR 05/18/2024 1249   LABSPEC 1.024 05/18/2024 1249   PHURINE 5.0 05/18/2024 1249   GLUCOSEU NEGATIVE 05/18/2024 1249   HGBUR NEGATIVE 05/18/2024 1249   BILIRUBINUR NEGATIVE 05/18/2024 1249   KETONESUR NEGATIVE 05/18/2024 1249   PROTEINUR NEGATIVE 05/18/2024 1249   UROBILINOGEN 0.2 03/04/2012 1315   NITRITE NEGATIVE 05/18/2024 1249   LEUKOCYTESUR NEGATIVE 05/18/2024 1249   Radiological Exams on  Admission: CT ANGIO HEAD NECK W WO CM Result Date: 05/18/2024 CLINICAL DATA:  Neuro deficit, acute, stroke suspected reported ataxia and facial droop EXAM: CT ANGIOGRAPHY HEAD AND NECK WITH CONTRAST TECHNIQUE: Multidetector CT imaging of the head and neck was performed using the standard protocol during bolus administration of intravenous contrast. Multiplanar CT image reconstructions and MIPs were obtained to evaluate the vascular anatomy. Carotid stenosis measurements (when applicable) are obtained utilizing NASCET criteria, using the distal internal carotid diameter as the denominator. RADIATION DOSE REDUCTION: This exam was performed according to the departmental dose-optimization program which includes automated exposure control, adjustment of the mA and/or kV according to patient size and/or use of iterative reconstruction technique. CONTRAST:  75mL OMNIPAQUE  IOHEXOL  350 MG/ML SOLN COMPARISON:  CT of the head dated May 18, 2024. FINDINGS: CTA NECK FINDINGS Aortic arch: Standard branching. Imaged portion shows no evidence of aneurysm or dissection. No significant stenosis of the major arch vessel origins. Right carotid system: The common  carotid arteries normal in caliber. There is calcific plaque present in the posterior wall of the proximal internal carotid artery, with less than 10% luminal stenosis. Left carotid system: The common carotid artery is normal in caliber and unremarkable. There is calcific plaque within the origin of the internal carotid artery, but no associated stenosis. Vertebral arteries: The vertebral arteries are codominant and normal in caliber. Skeleton: Mild-to-moderate degenerative disc disease and facet arthrosis. Other neck: Negative. Upper chest: Clear Review of the MIP images confirms the above findings CTA HEAD FINDINGS Anterior circulation: No significant stenosis, proximal occlusion, aneurysm, or vascular malformation. Posterior circulation: There is fetal type origin of the right posterior cerebral artery. There is mild stenosis of the V4 segment of the right vertebral artery. The basilar artery is normal in caliber. Posterior cerebral arteries and the cerebellar arteries are patent and normal in caliber. Venous sinuses: Patent. Anatomic variants: Fetal type origin of the right posterior cerebral artery. Review of the MIP images confirms the above findings IMPRESSION: 1. Mild calcific plaque within the proximal internal carotid arteries with less than 10% stenosis bilaterally 2. Essentially negative CT angiogram of the head. Electronically Signed   By: Evalene Coho M.D.   On: 05/18/2024 14:55   CT HEAD WO CONTRAST Result Date: 05/18/2024 CLINICAL DATA:  confusion EXAM: CT HEAD WITHOUT CONTRAST TECHNIQUE: Contiguous axial images were obtained from the base of the skull through the vertex without intravenous contrast. RADIATION DOSE REDUCTION: This exam was performed according to the departmental dose-optimization program which includes automated exposure control, adjustment of the mA and/or kV according to patient size and/or use of iterative reconstruction technique. COMPARISON:  May 23, 2023 FINDINGS:  Brain: The ventricles appear age appropriate. No mass effect or midline shift. Gray-white differentiation is preserved.Scattered periventricular white matter hypoattenuation, most consistent with changes of mild chronic ischemic microvascular disease.No evidence of acute territorial infarction, extra-axial fluid collection, hemorrhage, or mass lesion. The basilar cisterns are patent without downward herniation. The cerebellar hemispheres and vermis are well formed without mass lesion or focal attenuation abnormality. Vascular: No hyperdense vessel. Calcified atherosclerotic plaque within the cavernous/supraclinoid internal carotid arteries. Skull: Normal. Negative for fracture or focal lesion. Sinuses/Orbits: Near-complete opacification of the right sphenoid sinus containing hyperdense material. Subtle thickening of the lateral sphenoid wall. The globes appear intact. No retrobulbar hematoma.Bilateral lens replacement. Other: None. IMPRESSION: 1. No acute intracranial abnormality, specifically, no acute hemorrhage, territorial infarction, or intracranial mass. 2. Near complete opacification of the right sphenoid sinus, which contains hyperdense material.  This is worrisome for chronic fungal sinusitis and nonemergent follow-up with ENT should be considered. Electronically Signed   By: Rogelia Myers M.D.   On: 05/18/2024 14:15   Data Reviewed: Relevant notes from primary care and specialist visits, past discharge summaries as available in EHR, including Care Everywhere . Prior diagnostic testing as pertinent to current admission diagnoses, Updated medications and problem lists for reconciliation .ED course, including vitals, labs, imaging, treatment and response to treatment,Triage notes, nursing and pharmacy notes and ED provider's notes.Notable results as noted in HPI.Discussed case with EDMD/ ED APP/ or Specialty MD on call and as needed.  Assessment & Plan  >>AMS/ Somnolence/Dizziness: Patient reports  episodes of dizziness and somnolence and transient alteration in awareness with headaches and is currently being followed by neurology and is currently on Depakote .  Head CT is initially ordered which showed fungal sinusitis and is otherwise negative.  CTA is negative.  MRI is ordered and pending.  2D echocardiogram is ordered and pending.  Suspect this may be TIA presentation due to her history and intermittent low blood pressures secondary to medications.  Or metabolic encephalopathy with her being intermittently disoriented.  We will obtain troponins and 2D echo to evaluate any cardiac etiology or dysrhythmia or valvular issues, in addition to D-dimer and VTE evaluation.  Will obtain a blood gas, lactic acid. Blood gas.    >>H/O headaches: 2/2  from fungal sinusitis and ENT recommended outpatient sx and to f/ u and make appt.   >> Thrombocytopenia: Patient has a history of hepatitis C that she says was treated we will get a viral load and PT/INR and ultrasound of the abdomen to evaluate hepatitis C can be associated with thrombocytopenia and can cause such due to other autoantibodies and resemble ITP, hypersplenism from portal hypertension, reduced thrombopoietin production from cirrhosis.  LDH pending.  Peripheral smear pending.US  of abd pending.    >> History of migraines/partial seizure disorder:  Will continue patient on Depakote  seizure precautions, fall precautions, aspiration precautions,.will defer to neuro or PCP for consideration of reduction of Wellbutrin  dose or discontinuation of same.    >> Low blood pressure: Will cut down dose of Cymbalta  to 30 mg, hold patient's triamterene -HCTZ.    DVT prophylaxis:  SCD's.  Consults:  Neurology.  Advance Care Planning:    Code Status: Full Code   Family Communication:  Spouse.   Disposition Plan:  Home.  Severity of Illness: The appropriate patient status for this patient is INPATIENT. Inpatient status is judged to be  reasonable and necessary in order to provide the required intensity of service to ensure the patient's safety. The patient's presenting symptoms, physical exam findings, and initial radiographic and laboratory data in the context of their chronic comorbidities is felt to place them at high risk for further clinical deterioration. Furthermore, it is not anticipated that the patient will be medically stable for discharge from the hospital within 2 midnights of admission.   * I certify that at the point of admission it is my clinical judgment that the patient will require inpatient hospital care spanning beyond 2 midnights from the point of admission due to high intensity of service, high risk for further deterioration and high frequency of surveillance required.*  Unresulted Labs (From admission, onward)     Start     Ordered   05/19/24 0500  Comprehensive metabolic panel  Tomorrow morning,   R        05/18/24 1752   05/19/24 0500  CBC  Tomorrow morning,   R        05/18/24 1752   05/18/24 1755  Technologist smear review  Once,   AD        05/18/24 1755   05/18/24 1750  HIV Antibody (routine testing w rflx)  (HIV Antibody (Routine testing w reflex) panel)  Once,   R        05/18/24 1752   05/18/24 1712  Urinalysis, Routine w reflex microscopic -Urine, Random  Add-on,   AD       Question:  Specimen Source  Answer:  Urine, Random   05/18/24 1734   05/18/24 1711  HCV Ab w Reflex to Quant PCR  Once,   URGENT        05/18/24 1734   05/18/24 1711  ANA w/Reflex if Positive  Once,   URGENT        05/18/24 1734   05/18/24 1710  ADAMTS13 Activity  Once,   URGENT        05/18/24 1734            Meds ordered this encounter  Medications   iohexol  (OMNIPAQUE ) 350 MG/ML injection 75 mL   thiamine  (VITAMIN B1) 500 mg in sodium chloride  0.9 % 50 mL IVPB   0.9 %  sodium chloride  infusion   sodium chloride  flush (NS) 0.9 % injection 3 mL   OR Linked Order Group    acetaminophen  (TYLENOL ) tablet 650  mg    acetaminophen  (TYLENOL ) suppository 650 mg   pantoprazole  (PROTONIX ) injection 40 mg   divalproex  (DEPAKOTE ) DR tablet 250 mg   DULoxetine  (CYMBALTA ) DR capsule 20 mg   buPROPion  (WELLBUTRIN  XL) 24 hr tablet 150 mg     Orders Placed This Encounter  Procedures   CT HEAD WO CONTRAST   CT ANGIO HEAD NECK W WO CM   MR BRAIN WO CONTRAST   US  Abdomen Complete   Comprehensive metabolic panel   CBC   Urinalysis, Routine w reflex microscopic -Urine, Clean Catch   TSH   T4, free   Lipid panel   Hemoglobin A1c   Valproic acid  level   Ethanol   D-dimer, quantitative   Lactate dehydrogenase   Differential   ADAMTS13 Activity   HCV Ab w Reflex to Quant PCR   ANA w/Reflex if Positive   Urinalysis, Routine w reflex microscopic -Urine, Random   Protime-INR   Technologist smear review   HIV Antibody (routine testing w rflx)   Comprehensive metabolic panel   CBC   CBC   Diet Heart Room service appropriate? Yes with Assist; Fluid consistency: Thin   Document Height and Actual Weight   Maintain IV access   Vital signs   Notify physician (specify)   Mobility Protocol: No Restrictions   Refer to Sidebar Report Mobility Protocol for Adult Inpatient   Initiate Adult Central Line Maintenance and Catheter Protocol for patients with central line (CVC, PICC, Port, Hemodialysis, Trialysis)   Daily weights   Intake and Output   Initiate CHG Protocol   Do not place and if present remove PureWick   Initiate Oral Care Protocol   Initiate Carrier Fluid Protocol   RN may order General Admission PRN Orders utilizing General Admission PRN medications (through manage orders) for the following patient needs: allergy symptoms (Claritin), cold sores (Carmex), cough (Robitussin DM), eye irritation (Liquifilm Tears), hemorrhoids (Tucks), indigestion (Maalox), minor skin irritation (Hydrocortisone  Cream), muscle pain (Ben Gay), nose irritation (saline nasal spray) and sore throat (Chloraseptic spray).  Ambulate with assistance   Swallow screen   Cardiac Monitoring - Continuous Indefinite   Full code   Consult for Unassigned Medical Admission   Consult to neurology   Pulse oximetry check with vital signs   Oxygen therapy Mode or (Route): Nasal cannula; Liters Per Minute: 2; Keep O2 saturation between: greater than 92 %   CBG monitoring, ED   I-Stat CG4 Lactic Acid   EKG 12-Lead   ED EKG   ECHOCARDIOGRAM COMPLETE BUBBLE STUDY   Admit to Inpatient (patient's expected length of stay will be greater than 2 midnights or inpatient only procedure)   Aspiration precautions    Author: Mario LULLA Blanch, MD 12 pm- 8 pm. [facility]. 05/18/2024 7:26 PM Please note for any communication after hours contact [facility] Assigned provider on call on Amion.

## 2024-05-18 NOTE — ED Triage Notes (Signed)
 POV/ with family/ placed in WC/ last known well was yesterday @ 3:00pm/ c/o weakness, dizziness, confusion and SHOB/ pt does not appear to be in any distress/  A&Ox4

## 2024-05-18 NOTE — ED Notes (Signed)
Pt to MRI with transport.

## 2024-05-18 NOTE — ED Provider Notes (Signed)
 I saw and evaluated the patient, reviewed the resident's note and I agree with the findings and plan.  EKG Interpretation Date/Time:  Monday May 18 2024 12:02:14 EDT Ventricular Rate:  76 PR Interval:  172 QRS Duration:  100 QT Interval:  376 QTC Calculation: 423 R Axis:   -50  Text Interpretation: Normal sinus rhythm Left anterior fascicular block Possible Anterolateral infarct , age undetermined Abnormal ECG When compared with ECG of 09-Oct-2011 17:57, PREVIOUS ECG IS PRESENT No significant change since last tracing Confirmed by Dasie Faden ([id number]) on 05/18/2024 1:00:29 PM   EKG shows normal sinus rhythm.  Patient presents with intermittent dizziness and weakness in ataxia.  Her exam is nonfocal at this time.  Patient will likely to be admitted for TIA workup   Dasie Faden, MD 05/18/24 1351

## 2024-05-18 NOTE — ED Notes (Signed)
 Patient transported to CT

## 2024-05-19 ENCOUNTER — Inpatient Hospital Stay (HOSPITAL_COMMUNITY)

## 2024-05-19 ENCOUNTER — Observation Stay (HOSPITAL_COMMUNITY)

## 2024-05-19 ENCOUNTER — Inpatient Hospital Stay (HOSPITAL_BASED_OUTPATIENT_CLINIC_OR_DEPARTMENT_OTHER)

## 2024-05-19 DIAGNOSIS — I2510 Atherosclerotic heart disease of native coronary artery without angina pectoris: Secondary | ICD-10-CM | POA: Diagnosis not present

## 2024-05-19 DIAGNOSIS — R0602 Shortness of breath: Secondary | ICD-10-CM

## 2024-05-19 DIAGNOSIS — J9811 Atelectasis: Secondary | ICD-10-CM | POA: Diagnosis not present

## 2024-05-19 DIAGNOSIS — R918 Other nonspecific abnormal finding of lung field: Secondary | ICD-10-CM | POA: Diagnosis not present

## 2024-05-19 DIAGNOSIS — R400 Somnolence: Secondary | ICD-10-CM

## 2024-05-19 DIAGNOSIS — K759 Inflammatory liver disease, unspecified: Secondary | ICD-10-CM | POA: Diagnosis not present

## 2024-05-19 DIAGNOSIS — D696 Thrombocytopenia, unspecified: Secondary | ICD-10-CM | POA: Diagnosis not present

## 2024-05-19 DIAGNOSIS — K8020 Calculus of gallbladder without cholecystitis without obstruction: Secondary | ICD-10-CM | POA: Diagnosis not present

## 2024-05-19 LAB — COMPREHENSIVE METABOLIC PANEL WITH GFR
ALT: 12 U/L (ref 0–44)
AST: 19 U/L (ref 15–41)
Albumin: 3.1 g/dL — ABNORMAL LOW (ref 3.5–5.0)
Alkaline Phosphatase: 47 U/L (ref 38–126)
Anion gap: 7 (ref 5–15)
BUN: 11 mg/dL (ref 8–23)
CO2: 26 mmol/L (ref 22–32)
Calcium: 9 mg/dL (ref 8.9–10.3)
Chloride: 108 mmol/L (ref 98–111)
Creatinine, Ser: 0.68 mg/dL (ref 0.44–1.00)
GFR, Estimated: >60 mL/min (ref >60)
Glucose, Bld: 83 mg/dL (ref 70–99)
Potassium: 3.8 mmol/L (ref 3.5–5.1)
Sodium: 141 mmol/L (ref 135–145)
Total Bilirubin: 0.7 mg/dL (ref 0.0–1.2)
Total Protein: 5.4 g/dL — ABNORMAL LOW (ref 6.5–8.1)

## 2024-05-19 LAB — ECHOCARDIOGRAM COMPLETE BUBBLE STUDY
AR max vel: 3.14 cm2
AV Area VTI: 3.55 cm2
AV Area mean vel: 3.1 cm2
AV Mean grad: 4 mmHg
AV Peak grad: 7.5 mmHg
Ao pk vel: 1.37 m/s
Area-P 1/2: 3.63 cm2
S' Lateral: 2.6 cm

## 2024-05-19 LAB — CBC
HCT: 34.5 % — ABNORMAL LOW (ref 36.0–46.0)
Hemoglobin: 11.9 g/dL — ABNORMAL LOW (ref 12.0–15.0)
MCH: 31.8 pg (ref 26.0–34.0)
MCHC: 34.5 g/dL (ref 30.0–36.0)
MCV: 92.2 fL (ref 80.0–100.0)
Platelets: 80 K/uL — ABNORMAL LOW (ref 150–400)
RBC: 3.74 MIL/uL — ABNORMAL LOW (ref 3.87–5.11)
RDW: 12.1 % (ref 11.5–15.5)
WBC: 4.2 K/uL (ref 4.0–10.5)
nRBC: 0 % (ref 0.0–0.2)

## 2024-05-19 LAB — HCV RT-PCR, QUANT (NON-GRAPH): Hepatitis C Quantitation: NOT DETECTED [IU]/mL

## 2024-05-19 LAB — HCV AB W REFLEX TO QUANT PCR: HCV Ab: REACTIVE — AB

## 2024-05-19 MED ORDER — PANTOPRAZOLE SODIUM 40 MG PO TBEC
40.0000 mg | DELAYED_RELEASE_TABLET | Freq: Two times a day (BID) | ORAL | Status: DC
  Administered 2024-05-19 – 2024-05-20 (×3): 40 mg via ORAL
  Filled 2024-05-19 (×3): qty 1

## 2024-05-19 MED ORDER — IOHEXOL 350 MG/ML SOLN
60.0000 mL | Freq: Once | INTRAVENOUS | Status: AC | PRN
  Administered 2024-05-19: 60 mL via INTRAVENOUS

## 2024-05-19 MED ORDER — TECHNETIUM TO 99M ALBUMIN AGGREGATED
4.0000 | Freq: Once | INTRAVENOUS | Status: AC | PRN
  Administered 2024-05-19: 4 via INTRAVENOUS

## 2024-05-19 NOTE — Progress Notes (Signed)
PHARMACIST - PHYSICIAN COMMUNICATION  DR:   Sloan Leiter  CONCERNING: IV to Oral Route Change Policy  RECOMMENDATION: This patient is receiving Protonix by the intravenous route.  Based on criteria approved by the Pharmacy and Therapeutics Committee, the intravenous medication(s) is/are being converted to the equivalent oral dose form(s).   DESCRIPTION: These criteria include:  The patient is eating (either orally or via tube) and/or has been taking other orally administered medications for a least 24 hours  The patient has no evidence of active gastrointestinal bleeding or impaired GI absorption (gastrectomy, short bowel, patient on TNA or NPO).  If you have questions about this conversion, please contact the Pharmacy Department  []   [phone] )  [facility] []   [phone] )  [facility] [x]   [phone] )  [facility] []   [phone] )  [facility] []   [phone] )  [facility]

## 2024-05-19 NOTE — Progress Notes (Signed)
 PT Cancellation Note and Discharge  Patient Details Name: [name] MRN: [id number] DOB: August 10, 1956   Cancelled Treatment:    Reason Eval/Treat Not Completed: PT screened, no needs identified, will sign off. Discussed pt case with OT who reports pt is currently mobilizing near baseline of function at a modified independent to supervision level and does not require a formal acute PT evaluation at this time. PT signing off. If needs change, please reconsult.     [name] 05/19/2024, 2:04 PM  [name], PT, DPT Acute Rehabilitation Services Secure Chat Preferred Office: [phone]

## 2024-05-19 NOTE — Progress Notes (Signed)
*  PRELIMINARY RESULTS* Echocardiogram 2D Echocardiogram has been performed.  [name] Stallion 05/19/2024, 1:26 PM

## 2024-05-19 NOTE — Plan of Care (Signed)

## 2024-05-19 NOTE — Evaluation (Signed)
 Occupational Therapy Evaluation Patient Details Name: [name] MRN: [id number] DOB: 01/16/1956 Today's Date: 05/19/2024   History of Present Illness   68 y.o. female admitted due to dizziness, weakness, confusion, MRI negative, suspect TIA or intermittent low BP secondary to medications or metabolic encephalopathy, 2D echo pending. 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 PLOF overall independent, needs help for socks/shoes. 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 Pt currently is close to baseline, supervision for safety with ambulation without AD due to intermittent dizziness, no overt LOB, VSS throughout session. Pt at this time has no acute or follow up OT needs, has all DME and assistance at home needed to remain safe/independent, signing off.      If plan is discharge home, recommend the following:   A little help with bathing/dressing/bathroom;Assist for transportation     Functional Status Assessment   Patient has had a recent decline in their functional status and demonstrates the ability to make significant improvements in function in a reasonable and predictable amount of time.     Equipment Recommendations   None recommended by OT     Recommendations for Other Services         Precautions/Restrictions   Precautions Precautions: Fall Recall of Precautions/Restrictions: Intact Restrictions Weight Bearing Restrictions Per Provider Order: No     Mobility Bed Mobility Overal bed mobility: Modified Independent                   Transfers Overall transfer level: Needs assistance Equipment used: None Transfers: Sit to/from Stand, Bed to chair/wheelchair/BSC Sit to Stand: Supervision     Step pivot transfers: Supervision     General transfer comment: supervision for safety      Balance Overall balance assessment: Mild deficits observed, not formally tested                                         ADL either performed or assessed with clinical judgement   ADL Overall ADL's : Needs assistance/impaired Eating/Feeding: Independent   Grooming: Supervision/safety;Standing   Upper Body Bathing: Supervision/ safety;Sitting   Lower Body Bathing: Minimal assistance;Sitting/lateral leans;Sit to/from stand   Upper Body Dressing : Supervision/safety   Lower Body Dressing: Minimal assistance;Sitting/lateral leans;Sit to/from stand   Toilet Transfer: Supervision/safety   Toileting- Architect and Hygiene: Modified independent       Functional mobility during ADLs: Supervision/safety General ADL Comments: Pt supervision for safety due to intermittent dizziness, no overt LOB with/without AD, min A for LB dressing at baseline     Vision Baseline Vision/History: 0 No visual deficits Ability to See in Adequate Light: 0 Adequate Patient Visual Report: No change from baseline       Perception         Praxis         Pertinent Vitals/Pain Pain Assessment Pain Assessment: 0-10 Pain Score: 3  Pain Location: RLE wounds Pain Descriptors / Indicators: Constant, Aching, Dull Pain Intervention(s): Monitored during session  Extremity/Trunk Assessment             Communication Communication Communication: No apparent difficulties   Cognition Arousal: Alert Behavior During Therapy: WFL for tasks assessed/performed Cognition: No apparent impairments             OT - Cognition Comments: A/O, reports brain fog and general forgetfulness                  Following commands: Intact       Cueing  General Comments   Cueing Techniques: Verbal cues  VSS, no drop in BP with standing or activities   Exercises     Shoulder Instructions      Home Living Family/patient expects to be discharged to:: Private residence Living Arrangements: Spouse/significant other Available Help at Discharge: Family;Available 24 hours/day Type of Home: House Home Access: Level entry     Home Layout: One level     Bathroom Shower/Tub: Walk-in shower         Home Equipment: Shower seat;Other (comment);Wheelchair - Forensic psychologist (2 wheels);Rollator (4 wheels);BSC/3in1 (hoyer lift)   Additional Comments: Pt lives with husband, who is a Education officer, environmental, daughter who does not work, Pt takes care of mother      Prior Functioning/Environment Prior Level of Function : Needs assist             Mobility Comments: ind without AD, reports brain fog since chemo, unsure of falls ADLs Comments: min A for LB ADLs    OT Problem List: Decreased activity tolerance;Impaired balance (sitting and/or standing);Pain   OT Treatment/Interventions:        OT Goals(Current goals can be found in the care plan section)   Acute Rehab OT Goals Patient Stated Goal: to return home OT Goal Formulation: With patient Time For Goal Achievement: 06/02/24 Potential to Achieve Goals: Good   OT Frequency:       Co-evaluation              AM-PAC OT 6 Clicks Daily Activity     Outcome Measure Help from another person eating meals?: None Help from another person taking care of personal grooming?: A Little Help from another person toileting, which includes using toliet, bedpan, or urinal?: A Little Help from another person bathing (including washing, rinsing, drying)?: A Little Help from another person to put on and taking off regular upper body clothing?: A Little Help from another person to put on and taking off regular lower body clothing?: A Little 6 Click  Score: 19   End of Session Equipment Utilized During Treatment: Gait belt Nurse Communication: Mobility status  Activity Tolerance: Patient tolerated treatment well Patient left: in bed;with call bell/phone within reach  OT Visit Diagnosis: Other abnormalities of gait and mobility (R26.89);Pain Pain - Right/Left: Right Pain - part of body: Leg                Time: 1315-1343 OT Time Calculation (min): 28 min Charges:  OT General Charges $OT Visit: 1 Visit OT Evaluation $OT Eval Low Complexity: 1 Low OT Treatments $Self Care/Home Management : 8-22 mins  [place], OTR/L   Elouise JONELLE Bott 05/19/2024, 1:53 PM

## 2024-05-19 NOTE — Plan of Care (Signed)
 MRI Brain is negative for any acute abnomralities. Recommend PT and OT. Low suspicion for neuropathy/myelopathy. Recommend follow up with her outpatient neurologist and consider EMG/NCS outpatient.  Plan discussed with Dr. Raenelle. Neurology will signoff. Please feel free to contact us  with any questions or concerns.  Julieth Tugman [facility]

## 2024-05-19 NOTE — Discharge Summary (Signed)
 Physician Discharge Summary  [name] FMW:[id number] DOB: [birth year] DOA: 05/18/2024  PCP: Erick Greig LABOR, NP  Admit date: 05/18/2024 Discharge date: 05/20/2024  Admitted From: Home Disposition: Home  Recommendations for Outpatient Follow-up:  Follow up with PCP in 1-2 weeks Please obtain BMP/CBC in one week Continue to follow-up with neurology  Home Health: N/A Equipment/Devices: N/A  Discharge Condition: Stable CODE STATUS: Full code Diet recommendation: Regular diet  Discharge summary: 68 year old with history of colon cancer status postchemotherapy and radiation currently in remission, 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  She was walking outside, she felt very off balance that she had to hold onto the walls.  On the way back home, she had significant difficulty navigating the stairs.  Woke up in the morning with improvement of balance however she was still foggy and difficult to focus so came to ER.  Came as code stroke in the ER with suspected right facial droop, however there was no neurological deficit on exam.  Admitted for observation.  Dizziness, lightheadedness and intermittent fogginess: Acute on chronic.  Currently stabilized. CT head without contrast, fairly normal. Does have some evidence of some sinusitis. CT angiogram head and neck with contrast, no large vessel occlusion. MRI of the brain, fairly normal.  No evidence of acute infarction or ischemia. 2D echocardiogram, normal ejection fraction.  Large PFO-discussed with cardiology-currently without any significance and requested to follow-up with neurology if that is attributing to her symptoms she can be referred to structural heart team for closure. D-dimer borderline elevated.  VQ scan with  intermediate probability of blood clot-CT angiogram negative for thromboembolism.  Lower extremity duplex is negative. Clinically improved.  Going home.  Avoiding diuretics.  Compression socks both lower extremities.  Outpatient follow-up.    Discharge Diagnoses:  Principal Problem:   AMS (altered mental status)    Discharge Instructions  Discharge Instructions     Ambulatory referral to ENT   Complete by: As directed    Diet - low sodium heart healthy   Complete by: As directed    Increase activity slowly   Complete by: As directed    No wound care   Complete by: As directed       Allergies as of 05/20/2024       Reactions   Bactrim [sulfamethoxazole-trimethoprim] Hives   Aloe Vera Other (See Comments)   Stuck onto skin per pt        Medication List     STOP taking these medications    doxycycline  100 MG capsule Commonly known as: VIBRAMYCIN        TAKE these medications    buPROPion  150 MG 24 hr tablet Commonly known as: WELLBUTRIN  XL Take 150 mg by mouth every evening.   divalproex  250 MG DR tablet Commonly known as: DEPAKOTE  Take 250 mg by mouth in the morning, at noon, in the evening, and at bedtime.   DULoxetine  60 MG capsule Commonly known as: CYMBALTA  Take 60 mg by mouth at bedtime.   esomeprazole 40 MG capsule Commonly known as: NEXIUM Take 40 mg by mouth daily. Pt states has been on for years   multivitamin with minerals Tabs tablet Take 1 tablet by mouth daily.        Allergies  Allergen Reactions   Bactrim [Sulfamethoxazole-Trimethoprim]  Hives   Aloe Vera Other (See Comments)    Stuck onto skin per pt    Consultations: Neurology    Procedures/Studies: VAS US  LOWER EXTREMITY VENOUS (DVT) Result Date: 05/20/2024  Lower Venous DVT Study Patient Name:  ROSALINDA SEAMAN  Date of Exam:   05/20/2024 Medical Rec #: [id number]      Accession #:    [id number] Date of Birth: 06-10-1956       Patient Gender: F Patient Age:   89 years  Exam Location:  [facility] Procedure:      VAS US  LOWER EXTREMITY VENOUS (DVT) Referring Phys: RENATO APPLEBAUM --------------------------------------------------------------------------------  Indications: Chronic swelling.  Risk Factors: History of RLE venous insufficiency. RLE GSV ablation 04/02/2019. Limitations: Poor ultrasound/tissue interface. Comparison Study: BLE (reflux) on 05/14/2018 was negative for DVT Performing Technologist: Ezzie Potters RVT, RDMS  Examination Guidelines: A complete evaluation includes B-mode imaging, spectral Doppler, color Doppler, and power Doppler as needed of all accessible portions of each vessel. Bilateral testing is considered an integral part of a complete examination. Limited examinations for reoccurring indications may be performed as noted. The reflux portion of the exam is performed with the patient in reverse Trendelenburg.  +---------+---------------+---------+-----------+----------+--------------+ RIGHT    CompressibilityPhasicitySpontaneityPropertiesThrombus Aging +---------+---------------+---------+-----------+----------+--------------+ CFV      Full           Yes      Yes                                 +---------+---------------+---------+-----------+----------+--------------+ SFJ      Full                                                        +---------+---------------+---------+-----------+----------+--------------+ FV Prox  Full           Yes      Yes                                 +---------+---------------+---------+-----------+----------+--------------+ FV Mid   Full           Yes      Yes                                 +---------+---------------+---------+-----------+----------+--------------+ FV DistalFull           Yes      Yes                                 +---------+---------------+---------+-----------+----------+--------------+ PFV      Full                                                         +---------+---------------+---------+-----------+----------+--------------+ POP      Full           Yes      Yes                                 +---------+---------------+---------+-----------+----------+--------------+  PTV      Full                                                        +---------+---------------+---------+-----------+----------+--------------+ PERO     Full                                                        +---------+---------------+---------+-----------+----------+--------------+   +---------+---------------+---------+-----------+----------+--------------+ LEFT     CompressibilityPhasicitySpontaneityPropertiesThrombus Aging +---------+---------------+---------+-----------+----------+--------------+ CFV      Full           Yes      Yes                                 +---------+---------------+---------+-----------+----------+--------------+ SFJ      Full                                                        +---------+---------------+---------+-----------+----------+--------------+ FV Prox  Full           Yes      Yes                                 +---------+---------------+---------+-----------+----------+--------------+ FV Mid   Full           Yes      Yes                                 +---------+---------------+---------+-----------+----------+--------------+ FV DistalFull           Yes      Yes                                 +---------+---------------+---------+-----------+----------+--------------+ PFV      Full                                                        +---------+---------------+---------+-----------+----------+--------------+ POP      Full           Yes      Yes                                 +---------+---------------+---------+-----------+----------+--------------+ PTV      Full                                                         +---------+---------------+---------+-----------+----------+--------------+  PERO     Full                                                        +---------+---------------+---------+-----------+----------+--------------+    Summary: BILATERAL: - No evidence of deep vein thrombosis seen in the lower extremities, bilaterally. -No evidence of popliteal cyst, bilaterally.   *See table(s) above for measurements and observations.    Preliminary    CT Angio Chest Pulmonary Embolism (PE) W or WO Contrast Result Date: 05/19/2024 CLINICAL DATA:  High probability pulmonary embolism EXAM: CT ANGIOGRAPHY CHEST WITH CONTRAST TECHNIQUE: Multidetector CT imaging of the chest was performed using the standard protocol during bolus administration of intravenous contrast. Multiplanar CT image reconstructions and MIPs were obtained to evaluate the vascular anatomy. RADIATION DOSE REDUCTION: This exam was performed according to the departmental dose-optimization program which includes automated exposure control, adjustment of the mA and/or kV according to patient size and/or use of iterative reconstruction technique. CONTRAST:  60mL OMNIPAQUE  IOHEXOL  350 MG/ML SOLN COMPARISON:  01/18/2023 FINDINGS: Cardiovascular: Adequate opacification of the pulmonary arterial tree. No intraluminal filling defect identified to suggest acute pulmonary embolism. Central pulmonary arteries are of normal caliber. Mild coronary artery calcification. Cardiac size is within normal limits. No pericardial effusion. No signal atherosclerotic calcification within the thoracic aorta. No aortic aneurysm. Right subclavian chest port catheter tip noted within the superior right atrium Mediastinum/Nodes: No enlarged mediastinal, hilar, or axillary lymph nodes. Thyroid gland, trachea, and esophagus demonstrate no significant findings. Lungs/Pleura: Mild left basilar atelectasis. Lungs are otherwise clear. No pneumothorax or pleural effusion. No central  obstructing lesion Upper Abdomen: Cholelithiasis.  No acute abnormality Musculoskeletal: No acute bone abnormality. No lytic or blastic bone lesion. Osseous structures are age appropriate. Review of the MIP images confirms the above findings. IMPRESSION: 1. No acute pulmonary embolism. No focal consolidation or pleural effusion. 2. Mild coronary artery calcification. 3. Cholelithiasis. Electronically Signed   By: Dorethia Molt M.D.   On: 05/19/2024 21:10   NM Pulmonary Perfusion Result Date: 05/19/2024 CLINICAL DATA:  Pulmonary embolism suspected, high probability EXAM: NUCLEAR MEDICINE PERFUSION LUNG SCAN TECHNIQUE: Perfusion images were obtained in multiple projections after intravenous injection of radiopharmaceutical. Ventilation scans intentionally deferred if perfusion scan and chest x-ray adequate for interpretation during COVID 19 epidemic. RADIOPHARMACEUTICALS:  4 mCi Tc-66m MAA IV COMPARISON:  Chest radiograph May 19, 2024 FINDINGS: There is a segmental filling defect involving the left posterior lower lobe correlating to platelike atelectasis/consolidation versus scarring on same day chest radiograph. Distribution of tracer activity within the right lung is unremarkable. Please note exam is suboptimal given the lack of ventilation exam. IMPRESSION: Perfusion filling defect along the posterior aspect of left lower lobe matching with abnormal finding on same day chest radiograph. Findings are low to intermediate probability for PE. Recommend dedicated CT chest angiogram for further assessment if clinically warranted. Electronically Signed   By: Megan  Zare M.D.   On: 05/19/2024 17:06   ECHOCARDIOGRAM COMPLETE BUBBLE STUDY Result Date: 05/19/2024    ECHOCARDIOGRAM REPORT   Patient Name:   TALIYAH WATROUS Date of Exam: 05/19/2024 Medical Rec #:  [id number]     Height:       67.0 in Accession #:    [id number]    Weight:       225.5 lb Date of Birth:  11-26-1955      BSA:          2.128 m Patient Age:    68  years      BP:           131/53 mmHg Patient Gender: F             HR:           77 bpm. Exam Location:  Inpatient Procedure: 2D Echo, Color Doppler and Cardiac Doppler (Both Spectral and Color            Flow Doppler were utilized during procedure). Indications:    Other-Full Diagnosis List  History:        Patient has prior history of Echocardiogram examinations, most                 recent 03/09/2019. Signs/Symptoms:Atypical chest pain.  Sonographer:    Benard Stallion Referring Phys: [phone] EKTA V PATEL IMPRESSIONS  1. Left ventricular ejection fraction, by estimation, is 55 to 60%. The left ventricle has normal function. The left ventricle has no regional wall motion abnormalities. Left ventricular diastolic parameters are consistent with Grade I diastolic dysfunction (impaired relaxation).  2. Right ventricular systolic function is normal. The right ventricular size is normal.  3. The mitral valve was not well visualized. No evidence of mitral valve regurgitation. No evidence of mitral stenosis.  4. The aortic valve is tricuspid. There is mild calcification of the aortic valve. Aortic valve regurgitation is not visualized. Aortic valve sclerosis is present, with no evidence of aortic valve stenosis.  5. Evidence of atrial level shunting detected by color flow Doppler. Agitated saline contrast bubble study was positive with shunting observed within 3-6 cardiac cycles suggestive of interatrial shunt. There is a large patent foramen ovale with predominantly right to left shunting across the atrial septum. Comparison(s): Similar to prior report; unable to load study from 2020. FINDINGS  Left Ventricle: Left ventricular ejection fraction, by estimation, is 55 to 60%. The left ventricle has normal function. The left ventricle has no regional wall motion abnormalities. The left ventricular internal cavity size was normal in size. There is  no left ventricular hypertrophy. Left ventricular diastolic parameters are  consistent with Grade I diastolic dysfunction (impaired relaxation). Right Ventricle: The right ventricular size is normal. No increase in right ventricular wall thickness. Right ventricular systolic function is normal. Left Atrium: Left atrial size was normal in size. Right Atrium: Right atrial size was normal in size. Pericardium: There is no evidence of pericardial effusion. Mitral Valve: The mitral valve was not well visualized. No evidence of mitral valve regurgitation. No evidence of mitral valve stenosis. Tricuspid Valve: The tricuspid valve is normal in structure. Tricuspid valve regurgitation is not demonstrated. No evidence of tricuspid stenosis. Aortic Valve: The aortic valve is tricuspid. There is mild calcification of the aortic valve. Aortic valve regurgitation is not visualized. Aortic valve sclerosis is present, with no evidence of aortic valve stenosis. Aortic valve mean gradient measures 4.0 mmHg. Aortic valve peak gradient measures 7.5 mmHg. Aortic valve area, by VTI measures 3.55 cm. Pulmonic Valve: The pulmonic valve was normal in structure. Pulmonic valve regurgitation is not visualized. No evidence of pulmonic stenosis. Aorta: The aortic root and ascending aorta are structurally normal, with no evidence of dilitation. IAS/Shunts: Evidence of atrial level shunting detected by color flow Doppler. Agitated saline contrast was given intravenously to evaluate for intracardiac shunting. Agitated saline contrast bubble study was positive with shunting observed within  3-6 cardiac cycles suggestive of interatrial shunt. A large patent foramen ovale is detected with predominantly right to left shunting across the atrial septum.  LEFT VENTRICLE PLAX 2D LVIDd:         3.70 cm   Diastology LVIDs:         2.60 cm   LV e' medial:    4.68 cm/s LV PW:         1.00 cm   LV E/e' medial:  12.1 LV IVS:        1.00 cm   LV e' lateral:   7.72 cm/s LVOT diam:     2.40 cm   LV E/e' lateral: 7.3 LV SV:         89 LV  SV Index:   42 LVOT Area:     4.52 cm  RIGHT VENTRICLE RV Basal diam:  3.15 cm RV Mid diam:    3.00 cm RV S prime:     12.70 cm/s TAPSE (M-mode): 2.2 cm LEFT ATRIUM             Index        RIGHT ATRIUM           Index LA diam:        2.40 cm 1.13 cm/m   RA Area:     13.10 cm LA Vol (A2C):   39.6 ml 18.61 ml/m  RA Volume:   28.90 ml  13.58 ml/m LA Vol (A4C):   39.7 ml 18.65 ml/m LA Biplane Vol: 47.0 ml 22.08 ml/m  AORTIC VALVE AV Area (Vmax):    3.14 cm AV Area (Vmean):   3.10 cm AV Area (VTI):     3.55 cm AV Vmax:           137.00 cm/s AV Vmean:          93.700 cm/s AV VTI:            0.251 m AV Peak Grad:      7.5 mmHg AV Mean Grad:      4.0 mmHg LVOT Vmax:         95.10 cm/s LVOT Vmean:        64.300 cm/s LVOT VTI:          0.197 m LVOT/AV VTI ratio: 0.78  AORTA Ao Root diam: 3.30 cm Ao Asc diam:  3.80 cm MITRAL VALVE               TRICUSPID VALVE MV Area (PHT): 3.63 cm    TR Peak grad:   19.2 mmHg MV Decel Time: 209 msec    TR Vmax:        219.00 cm/s MV E velocity: 56.60 cm/s MV A velocity: 82.70 cm/s  SHUNTS MV E/A ratio:  0.68        Systemic VTI:  0.20 m                            Systemic Diam: 2.40 cm Stanly Leavens MD Electronically signed by Stanly Leavens MD Signature Date/Time: 05/19/2024/1:59:42 PM    Final    DG CHEST PORT 1 VIEW Result Date: 05/19/2024 CLINICAL DATA:  [id number] History of V/Q scan [id number] EXAM: PORTABLE CHEST - 1 VIEW COMPARISON:  05/23/2023 FINDINGS: Stable power injectable right subclavian port catheter to the distal SVC. Stable linear scarring or atelectasis in the left lower lung. Lungs otherwise clear. Heart size and mediastinal contours are within normal limits.  No effusion. Visualized bones unremarkable. IMPRESSION: No acute cardiopulmonary disease. Electronically Signed   By: JONETTA Faes M.D.   On: 05/19/2024 12:31   US  Abdomen Complete Result Date: 05/19/2024 CLINICAL DATA:  68 year old female with confusion, hepatitis, thrombocytopenia. History of  colon cancer. EXAM: ABDOMEN ULTRASOUND COMPLETE COMPARISON:  CT Chest, Abdomen, and Pelvis [facility] 01/17/2023. FINDINGS: Gallbladder: Chronic gallstones. Numerous shadowing stones *CRASH* that numerous shadowing stones individually estimated up to 12 mm. Gallbladder wall thickness remains normal. No sonographic Murphy sign elicited. No pericholecystic fluid. Common bile duct: Diameter: Ranging from 3 mm to 7 mm, upper limits of normal. Liver: Background liver echogenicity at the upper limits of normal to mildly increased (image 28). No discrete liver lesion. No intrahepatic biliary ductal dilatation identified. Portal vein is patent on color Doppler imaging with normal direction of blood flow towards the liver. IVC: No abnormality visualized. Pancreas: Visualized portion unremarkable. Spleen: Size and appearance within normal limits. Estimated splenic volume 160 mL (normal splenic volume range 83 - 412 mL). Right Kidney: Length: 10.3 cm. Echogenicity within normal limits. No mass or hydronephrosis visualized. Left Kidney: Length: 11.1 cm. Echogenicity within normal limits. No mass or hydronephrosis visualized. Abdominal aorta: No aneurysm visualized. Other findings: None, no free fluid. IMPRESSION: 1. Chronic Cholelithiasis. No evidence of acute cholecystitis. Liver and bile ducts within normal limits by ultrasound. 2. Otherwise negative ultrasound appearance of the abdomen. Electronically Signed   By: VEAR Hurst M.D.   On: 05/19/2024 04:44   MR BRAIN WO CONTRAST Result Date: 05/18/2024 CLINICAL DATA:  Neuro deficit, acute, stroke suspected EXAM: MRI HEAD WITHOUT CONTRAST TECHNIQUE: Multiplanar, multiecho pulse sequences of the brain and surrounding structures were obtained without intravenous contrast. COMPARISON:  CT the head dated May 18, 2024. FINDINGS: Brain: There is no restricted diffusion to indicate acute or recent infarction. There is generalized cerebral volume loss present. There is mild  diffuse cerebral white matter disease. There is no evidence of hemorrhage, mass, cortical infarct or hydrocephalus. Vascular: Normal flow voids. Skull and upper cervical spine: Normal marrow signal. No osseous lesions. Sinuses/Orbits: Opacification of the right sphenoid sinus. Mild mucosal disease in the floor of the left maxillary sinus. Status post bilateral lens replacement. Other: None. IMPRESSION: 1. Age-related atrophy and mild cerebral white matter disease. 2. Right sphenoid sinus disease. Electronically Signed   By: Evalene Coho M.D.   On: 05/18/2024 19:24   CT ANGIO HEAD NECK W WO CM Result Date: 05/18/2024 CLINICAL DATA:  Neuro deficit, acute, stroke suspected reported ataxia and facial droop EXAM: CT ANGIOGRAPHY HEAD AND NECK WITH CONTRAST TECHNIQUE: Multidetector CT imaging of the head and neck was performed using the standard protocol during bolus administration of intravenous contrast. Multiplanar CT image reconstructions and MIPs were obtained to evaluate the vascular anatomy. Carotid stenosis measurements (when applicable) are obtained utilizing NASCET criteria, using the distal internal carotid diameter as the denominator. RADIATION DOSE REDUCTION: This exam was performed according to the departmental dose-optimization program which includes automated exposure control, adjustment of the mA and/or kV according to patient size and/or use of iterative reconstruction technique. CONTRAST:  75mL OMNIPAQUE  IOHEXOL  350 MG/ML SOLN COMPARISON:  CT of the head dated May 18, 2024. FINDINGS: CTA NECK FINDINGS Aortic arch: Standard branching. Imaged portion shows no evidence of aneurysm or dissection. No significant stenosis of the major arch vessel origins. Right carotid system: The common carotid arteries normal in caliber. There is calcific plaque present in the posterior wall of the proximal internal carotid artery,  with less than 10% luminal stenosis. Left carotid system: The common carotid artery  is normal in caliber and unremarkable. There is calcific plaque within the origin of the internal carotid artery, but no associated stenosis. Vertebral arteries: The vertebral arteries are codominant and normal in caliber. Skeleton: Mild-to-moderate degenerative disc disease and facet arthrosis. Other neck: Negative. Upper chest: Clear Review of the MIP images confirms the above findings CTA HEAD FINDINGS Anterior circulation: No significant stenosis, proximal occlusion, aneurysm, or vascular malformation. Posterior circulation: There is fetal type origin of the right posterior cerebral artery. There is mild stenosis of the V4 segment of the right vertebral artery. The basilar artery is normal in caliber. Posterior cerebral arteries and the cerebellar arteries are patent and normal in caliber. Venous sinuses: Patent. Anatomic variants: Fetal type origin of the right posterior cerebral artery. Review of the MIP images confirms the above findings IMPRESSION: 1. Mild calcific plaque within the proximal internal carotid arteries with less than 10% stenosis bilaterally 2. Essentially negative CT angiogram of the head. Electronically Signed   By: Evalene Coho M.D.   On: 05/18/2024 14:55   CT HEAD WO CONTRAST Result Date: 05/18/2024 CLINICAL DATA:  confusion EXAM: CT HEAD WITHOUT CONTRAST TECHNIQUE: Contiguous axial images were obtained from the base of the skull through the vertex without intravenous contrast. RADIATION DOSE REDUCTION: This exam was performed according to the departmental dose-optimization program which includes automated exposure control, adjustment of the mA and/or kV according to patient size and/or use of iterative reconstruction technique. COMPARISON:  May 23, 2023 FINDINGS: Brain: The ventricles appear age appropriate. No mass effect or midline shift. Gray-white differentiation is preserved.Scattered periventricular white matter hypoattenuation, most consistent with changes of mild chronic  ischemic microvascular disease.No evidence of acute territorial infarction, extra-axial fluid collection, hemorrhage, or mass lesion. The basilar cisterns are patent without downward herniation. The cerebellar hemispheres and vermis are well formed without mass lesion or focal attenuation abnormality. Vascular: No hyperdense vessel. Calcified atherosclerotic plaque within the cavernous/supraclinoid internal carotid arteries. Skull: Normal. Negative for fracture or focal lesion. Sinuses/Orbits: Near-complete opacification of the right sphenoid sinus containing hyperdense material. Subtle thickening of the lateral sphenoid wall. The globes appear intact. No retrobulbar hematoma.Bilateral lens replacement. Other: None. IMPRESSION: 1. No acute intracranial abnormality, specifically, no acute hemorrhage, territorial infarction, or intracranial mass. 2. Near complete opacification of the right sphenoid sinus, which contains hyperdense material. This is worrisome for chronic fungal sinusitis and nonemergent follow-up with ENT should be considered. Electronically Signed   By: Rogelia Myers M.D.   On: 05/18/2024 14:15   (Echo, Carotid, EGD, Colonoscopy, ERCP)    Subjective: patient seen and examined .  Walked around.  Balance is better.  Walked around with PT/OT, no issues . Agreeable with plans to outpatient follow up    Discharge Exam: Vitals:   05/20/24 0733 05/20/24 1115  BP: 117/68 (!) 107/50  Pulse: 74 84  Resp: 16 18  Temp: 97.6 F (36.4 C) 98.2 F (36.8 C)  SpO2: 99% 99%   Vitals:   05/20/24 0031 05/20/24 0413 05/20/24 0733 05/20/24 1115  BP: (!) 113/51 (!) 108/50 117/68 (!) 107/50  Pulse: 73 77 74 84  Resp: 18 17 16 18   Temp: (!) 97.4 F (36.3 C) 97.9 F (36.6 C) 97.6 F (36.4 C) 98.2 F (36.8 C)  TempSrc: Oral Oral Oral Oral  SpO2: 98% 99% 99% 99%  Weight:      Height:        General: Pt  is alert, awake, not in acute distress Cardiovascular: RRR, S1/S2 +, no rubs, no  gallops Respiratory: CTA bilaterally, no wheezing, no rhonchi Abdominal: Soft, NT, ND, bowel sounds + Extremities: trace edema both extremities,     The results of significant diagnostics from this hospitalization (including imaging, microbiology, ancillary and laboratory) are listed below for reference.     Microbiology: No results found for this or any previous visit (from the past 240 hours).   Labs: BNP (last 3 results) No results for input(s): BNP in the last 8760 hours. Basic Metabolic Panel: Recent Labs  Lab 05/18/24 1222 05/19/24 0445  NA 141 141  K 4.1 3.8  CL 107 108  CO2 27 26  GLUCOSE 98 83  BUN 16 11  CREATININE 0.79 0.68  CALCIUM 9.6 9.0   Liver Function Tests: Recent Labs  Lab 05/18/24 1222 05/19/24 0445  AST 20 19  ALT 14 12  ALKPHOS 63 47  BILITOT 0.6 0.7  PROT 6.2* 5.4*  ALBUMIN  3.6 3.1*   No results for input(s): LIPASE, AMYLASE in the last 168 hours. No results for input(s): AMMONIA in the last 168 hours. CBC: Recent Labs  Lab 05/18/24 1222 05/18/24 1710 05/19/24 0445  WBC 4.8 4.4 4.2  NEUTROABS  --  2.6  --   HGB 12.7 12.2 11.9*  HCT 39.2 35.8* 34.5*  MCV 96.8 94.0 92.2  PLT 93* 84* 80*   Cardiac Enzymes: No results for input(s): CKTOTAL, CKMB, CKMBINDEX, TROPONINI in the last 168 hours. BNP: Invalid input(s): POCBNP CBG: Recent Labs  Lab 05/18/24 1226  GLUCAP 105*   D-Dimer Recent Labs    05/18/24 1646  DDIMER 0.56*   Hgb A1c Recent Labs    05/18/24 1326  HGBA1C 4.1*   Lipid Profile Recent Labs    05/18/24 1326  CHOL 185  HDL 61  LDLCALC 114*  TRIG 52  CHOLHDL 3.0   Thyroid function studies Recent Labs    05/18/24 1326  TSH 1.336   Anemia work up No results for input(s): VITAMINB12, FOLATE, FERRITIN, TIBC, IRON , RETICCTPCT in the last 72 hours. Urinalysis    Component Value Date/Time   COLORURINE YELLOW 05/18/2024 1249   APPEARANCEUR CLEAR 05/18/2024 1249   LABSPEC  1.024 05/18/2024 1249   PHURINE 5.0 05/18/2024 1249   GLUCOSEU NEGATIVE 05/18/2024 1249   HGBUR NEGATIVE 05/18/2024 1249   BILIRUBINUR NEGATIVE 05/18/2024 1249   KETONESUR NEGATIVE 05/18/2024 1249   PROTEINUR NEGATIVE 05/18/2024 1249   UROBILINOGEN 0.2 03/04/2012 1315   NITRITE NEGATIVE 05/18/2024 1249   LEUKOCYTESUR NEGATIVE 05/18/2024 1249   Sepsis Labs Recent Labs  Lab 05/18/24 1222 05/18/24 1710 05/19/24 0445  WBC 4.8 4.4 4.2   Microbiology No results found for this or any previous visit (from the past 240 hours).   Time coordinating discharge: 35 minutes  SIGNED:   Renato Applebaum, MD  [facility] 05/20/2024, 1:00 PM

## 2024-05-19 NOTE — Care Management CC44 (Signed)
 Condition Code 44 Documentation Completed  Patient Details  Name: HUE STEVESON MRN: [id number] Date of Birth: [birth year]   Condition Code 44 given:  Yes Patient signature on Condition Code 44 notice:  Yes Documentation of 2 MD's agreement:  Yes Code 44 added to claim:  Yes    Andrez JULIANNA George, RN 05/19/2024, 2:45 PM

## 2024-05-19 NOTE — Care Management Obs Status (Signed)
 MEDICARE OBSERVATION STATUS NOTIFICATION   Patient Details  Name: [name] MRN: [id number] Date of Birth: 1956/08/21   Medicare Observation Status Notification Given:  Yes    Andrez JULIANNA George, RN 05/19/2024, 2:45 PM

## 2024-05-19 NOTE — Progress Notes (Signed)
 PROGRESS NOTE    [name]  FMW:[id number] DOB: September 22, 1956 DOA: 05/18/2024 PCP: Erick Greig LABOR, NP    Brief Narrative:  68 year old with history of colon cancer status postchemotherapy and radiation currently in remission, chronic TTP, history of episodic transient alternation of awareness along with bitemporal headache currently followed by neurology on Depakote  presented with an episode where she was off balance and she had to hold onto the walls.  She was also noted to be confused.  In the ER brought in as a code stroke that was later ruled out.  Admitted for symptomatic management.  Subjective: Patient seen multiple times today.  Husband at the bedside.  She had done very well and remained without difficulty walking today.  Her VQ scan was reported to be intermediate possibility, patient unfortunately have to stay today in the hospital to complete CT angiogram. Assessment & Plan:  Dizziness, lightheadedness and intermittent fogginess: Acute on chronic issue.  Currently stabilized.  No focal neurological deficit. Underwent extensive investigations including CT head without contrast, fairly normal, evidence of frontal sinusitis MRI of the brain, fairly normal 2D echocardiogram with normal ejection fraction, evidence of large PFO-discussed with cardiology-currently without neurological deficits.  Will need to investigate for presence of any thromboembolism. D-dimer borderline elevated.  VQ scan with left lower lobe filling defect. CT angiogram of the chest recommended-ordered. Chronic swelling of the legs-will order duplexes. Neurology recommended symptomatic management and outpatient follow-up. Continue Depakote  4 times daily. Compression stockings.  Fall precautions all the time.      DVT prophylaxis: Place and maintain sequential compression device Start: 05/19/24 1221   Code Status: Full code Family Communication: Husband at the bedside Disposition Plan: Status is:  Observation The patient remains OBS appropriate and will d/c before 2 midnights.     Consultants:  Neurology  Procedures:  None  Antimicrobials:  None     Objective: Vitals:   05/19/24 0425 05/19/24 0425 05/19/24 0732 05/19/24 1658  BP:  104/61 (!) 131/53 (!) 104/56  Pulse:  79 78 80  Resp:  18 16 20   Temp:  (!) 97.5 F (36.4 C) (!) 97.5 F (36.4 C) 97.8 F (36.6 C)  TempSrc:  Oral Oral Oral  SpO2:  100% 100% 98%  Weight: 102.3 kg     Height:        Intake/Output Summary (Last 24 hours) at 05/19/2024 1914 Last data filed at 05/19/2024 0554 Gross per 24 hour  Intake 783.61 ml  Output --  Net 783.61 ml   Filed Weights   05/18/24 1327 05/18/24 2105 05/19/24 0425  Weight: 105.2 kg 102.3 kg 102.3 kg    Examination:  General exam: Appears calm and comfortable  Respiratory system: Clear to auscultation. Respiratory effort normal. Cardiovascular system: S1 & S2 heard, RRR. No JVD, murmurs, rubs, gallops or clicks. No pedal edema. Gastrointestinal system: Abdomen is nondistended, soft and nontender. No organomegaly or masses felt. Normal bowel sounds heard. Central nervous system: Alert and oriented. No focal neurological deficits. Extremities: Symmetric 5 x 5 power. 1+ swelling both lower extremity, chronic venous stasis changes.     Data Reviewed: I have personally reviewed following labs and imaging studies  CBC: Recent Labs  Lab 05/18/24 1222 05/18/24 1710 05/19/24 0445  WBC 4.8 4.4 4.2  NEUTROABS  --  2.6  --   HGB 12.7 12.2 11.9*  HCT 39.2 35.8* 34.5*  MCV 96.8 94.0 92.2  PLT 93* 84* 80*   Basic Metabolic Panel: Recent Labs  Lab 05/18/24  1222 05/19/24 0445  NA 141 141  K 4.1 3.8  CL 107 108  CO2 27 26  GLUCOSE 98 83  BUN 16 11  CREATININE 0.79 0.68  CALCIUM 9.6 9.0   GFR: Estimated Creatinine Clearance: 83.9 mL/min (by C-G formula based on SCr of 0.68 mg/dL). Liver Function Tests: Recent Labs  Lab 05/18/24 1222 05/19/24 0445  AST  20 19  ALT 14 12  ALKPHOS 63 47  BILITOT 0.6 0.7  PROT 6.2* 5.4*  ALBUMIN  3.6 3.1*   No results for input(s): LIPASE, AMYLASE in the last 168 hours. No results for input(s): AMMONIA in the last 168 hours. Coagulation Profile: Recent Labs  Lab 05/18/24 1710  INR 1.0   Cardiac Enzymes: No results for input(s): CKTOTAL, CKMB, CKMBINDEX, TROPONINI in the last 168 hours. BNP (last 3 results) No results for input(s): PROBNP in the last 8760 hours. HbA1C: Recent Labs    05/18/24 1326  HGBA1C 4.1*   CBG: Recent Labs  Lab 05/18/24 1226  GLUCAP 105*   Lipid Profile: Recent Labs    05/18/24 1326  CHOL 185  HDL 61  LDLCALC 114*  TRIG 52  CHOLHDL 3.0   Thyroid Function Tests: Recent Labs    05/18/24 1222 05/18/24 1326  TSH  --  1.336  FREET4 0.75  --    Anemia Panel: No results for input(s): VITAMINB12, FOLATE, FERRITIN, TIBC, IRON , RETICCTPCT in the last 72 hours. Sepsis Labs: Recent Labs  Lab 05/18/24 1808  LATICACIDVEN 0.6    No results found for this or any previous visit (from the past 240 hours).       Radiology Studies: NM Pulmonary Perfusion Result Date: 05/19/2024 CLINICAL DATA:  Pulmonary embolism suspected, high probability EXAM: NUCLEAR MEDICINE PERFUSION LUNG SCAN TECHNIQUE: Perfusion images were obtained in multiple projections after intravenous injection of radiopharmaceutical. Ventilation scans intentionally deferred if perfusion scan and chest x-ray adequate for interpretation during COVID 19 epidemic. RADIOPHARMACEUTICALS:  4 mCi Tc-21m MAA IV COMPARISON:  Chest radiograph May 19, 2024 FINDINGS: There is a segmental filling defect involving the left posterior lower lobe correlating to platelike atelectasis/consolidation versus scarring on same day chest radiograph. Distribution of tracer activity within the right lung is unremarkable. Please note exam is suboptimal given the lack of ventilation exam. IMPRESSION:  Perfusion filling defect along the posterior aspect of left lower lobe matching with abnormal finding on same day chest radiograph. Findings are low to intermediate probability for PE. Recommend dedicated CT chest angiogram for further assessment if clinically warranted. Electronically Signed   By: Megan  Zare M.D.   On: 05/19/2024 17:06   ECHOCARDIOGRAM COMPLETE BUBBLE STUDY Result Date: 05/19/2024    ECHOCARDIOGRAM REPORT   Patient Name:   DOROTA HEINRICHS Date of Exam: 05/19/2024 Medical Rec #:  [id number]     Height:       67.0 in Accession #:    [id number]    Weight:       225.5 lb Date of Birth:  09-03-1956      BSA:          2.128 m Patient Age:    68 years      BP:           131/53 mmHg Patient Gender: F             HR:           77 bpm. Exam Location:  Inpatient Procedure: 2D Echo, Color Doppler and Cardiac Doppler (Both Spectral and  Color            Flow Doppler were utilized during procedure). Indications:    Other-Full Diagnosis List  History:        Patient has prior history of Echocardiogram examinations, most                 recent 03/09/2019. Signs/Symptoms:Atypical chest pain.  Sonographer:    Benard Stallion Referring Phys: [phone] EKTA V PATEL IMPRESSIONS  1. Left ventricular ejection fraction, by estimation, is 55 to 60%. The left ventricle has normal function. The left ventricle has no regional wall motion abnormalities. Left ventricular diastolic parameters are consistent with Grade I diastolic dysfunction (impaired relaxation).  2. Right ventricular systolic function is normal. The right ventricular size is normal.  3. The mitral valve was not well visualized. No evidence of mitral valve regurgitation. No evidence of mitral stenosis.  4. The aortic valve is tricuspid. There is mild calcification of the aortic valve. Aortic valve regurgitation is not visualized. Aortic valve sclerosis is present, with no evidence of aortic valve stenosis.  5. Evidence of atrial level shunting detected by color  flow Doppler. Agitated saline contrast bubble study was positive with shunting observed within 3-6 cardiac cycles suggestive of interatrial shunt. There is a large patent foramen ovale with predominantly right to left shunting across the atrial septum. Comparison(s): Similar to prior report; unable to load study from 2020. FINDINGS  Left Ventricle: Left ventricular ejection fraction, by estimation, is 55 to 60%. The left ventricle has normal function. The left ventricle has no regional wall motion abnormalities. The left ventricular internal cavity size was normal in size. There is  no left ventricular hypertrophy. Left ventricular diastolic parameters are consistent with Grade I diastolic dysfunction (impaired relaxation). Right Ventricle: The right ventricular size is normal. No increase in right ventricular wall thickness. Right ventricular systolic function is normal. Left Atrium: Left atrial size was normal in size. Right Atrium: Right atrial size was normal in size. Pericardium: There is no evidence of pericardial effusion. Mitral Valve: The mitral valve was not well visualized. No evidence of mitral valve regurgitation. No evidence of mitral valve stenosis. Tricuspid Valve: The tricuspid valve is normal in structure. Tricuspid valve regurgitation is not demonstrated. No evidence of tricuspid stenosis. Aortic Valve: The aortic valve is tricuspid. There is mild calcification of the aortic valve. Aortic valve regurgitation is not visualized. Aortic valve sclerosis is present, with no evidence of aortic valve stenosis. Aortic valve mean gradient measures 4.0 mmHg. Aortic valve peak gradient measures 7.5 mmHg. Aortic valve area, by VTI measures 3.55 cm. Pulmonic Valve: The pulmonic valve was normal in structure. Pulmonic valve regurgitation is not visualized. No evidence of pulmonic stenosis. Aorta: The aortic root and ascending aorta are structurally normal, with no evidence of dilitation. IAS/Shunts: Evidence  of atrial level shunting detected by color flow Doppler. Agitated saline contrast was given intravenously to evaluate for intracardiac shunting. Agitated saline contrast bubble study was positive with shunting observed within 3-6 cardiac cycles suggestive of interatrial shunt. A large patent foramen ovale is detected with predominantly right to left shunting across the atrial septum.  LEFT VENTRICLE PLAX 2D LVIDd:         3.70 cm   Diastology LVIDs:         2.60 cm   LV e' medial:    4.68 cm/s LV PW:         1.00 cm   LV E/e' medial:  12.1 LV IVS:  1.00 cm   LV e' lateral:   7.72 cm/s LVOT diam:     2.40 cm   LV E/e' lateral: 7.3 LV SV:         89 LV SV Index:   42 LVOT Area:     4.52 cm  RIGHT VENTRICLE RV Basal diam:  3.15 cm RV Mid diam:    3.00 cm RV S prime:     12.70 cm/s TAPSE (M-mode): 2.2 cm LEFT ATRIUM             Index        RIGHT ATRIUM           Index LA diam:        2.40 cm 1.13 cm/m   RA Area:     13.10 cm LA Vol (A2C):   39.6 ml 18.61 ml/m  RA Volume:   28.90 ml  13.58 ml/m LA Vol (A4C):   39.7 ml 18.65 ml/m LA Biplane Vol: 47.0 ml 22.08 ml/m  AORTIC VALVE AV Area (Vmax):    3.14 cm AV Area (Vmean):   3.10 cm AV Area (VTI):     3.55 cm AV Vmax:           137.00 cm/s AV Vmean:          93.700 cm/s AV VTI:            0.251 m AV Peak Grad:      7.5 mmHg AV Mean Grad:      4.0 mmHg LVOT Vmax:         95.10 cm/s LVOT Vmean:        64.300 cm/s LVOT VTI:          0.197 m LVOT/AV VTI ratio: 0.78  AORTA Ao Root diam: 3.30 cm Ao Asc diam:  3.80 cm MITRAL VALVE               TRICUSPID VALVE MV Area (PHT): 3.63 cm    TR Peak grad:   19.2 mmHg MV Decel Time: 209 msec    TR Vmax:        219.00 cm/s MV E velocity: 56.60 cm/s MV A velocity: 82.70 cm/s  SHUNTS MV E/A ratio:  0.68        Systemic VTI:  0.20 m                            Systemic Diam: 2.40 cm Stanly Leavens MD Electronically signed by Stanly Leavens MD Signature Date/Time: 05/19/2024/1:59:42 PM    Final    DG CHEST PORT 1  VIEW Result Date: 05/19/2024 CLINICAL DATA:  [id number] History of V/Q scan [id number] EXAM: PORTABLE CHEST - 1 VIEW COMPARISON:  05/23/2023 FINDINGS: Stable power injectable right subclavian port catheter to the distal SVC. Stable linear scarring or atelectasis in the left lower lung. Lungs otherwise clear. Heart size and mediastinal contours are within normal limits. No effusion. Visualized bones unremarkable. IMPRESSION: No acute cardiopulmonary disease. Electronically Signed   By: JONETTA Faes M.D.   On: 05/19/2024 12:31   US  Abdomen Complete Result Date: 05/19/2024 CLINICAL DATA:  68 year old female with confusion, hepatitis, thrombocytopenia. History of colon cancer. EXAM: ABDOMEN ULTRASOUND COMPLETE COMPARISON:  CT Chest, Abdomen, and Pelvis [facility] 01/17/2023. FINDINGS: Gallbladder: Chronic gallstones. Numerous shadowing stones *CRASH* that numerous shadowing stones individually estimated up to 12 mm. Gallbladder wall thickness remains normal. No sonographic Murphy sign elicited. No pericholecystic fluid. Common bile  duct: Diameter: Ranging from 3 mm to 7 mm, upper limits of normal. Liver: Background liver echogenicity at the upper limits of normal to mildly increased (image 28). No discrete liver lesion. No intrahepatic biliary ductal dilatation identified. Portal vein is patent on color Doppler imaging with normal direction of blood flow towards the liver. IVC: No abnormality visualized. Pancreas: Visualized portion unremarkable. Spleen: Size and appearance within normal limits. Estimated splenic volume 160 mL (normal splenic volume range 83 - 412 mL). Right Kidney: Length: 10.3 cm. Echogenicity within normal limits. No mass or hydronephrosis visualized. Left Kidney: Length: 11.1 cm. Echogenicity within normal limits. No mass or hydronephrosis visualized. Abdominal aorta: No aneurysm visualized. Other findings: None, no free fluid. IMPRESSION: 1. Chronic Cholelithiasis. No evidence of acute  cholecystitis. Liver and bile ducts within normal limits by ultrasound. 2. Otherwise negative ultrasound appearance of the abdomen. Electronically Signed   By: VEAR Hurst M.D.   On: 05/19/2024 04:44   MR BRAIN WO CONTRAST Result Date: 05/18/2024 CLINICAL DATA:  Neuro deficit, acute, stroke suspected EXAM: MRI HEAD WITHOUT CONTRAST TECHNIQUE: Multiplanar, multiecho pulse sequences of the brain and surrounding structures were obtained without intravenous contrast. COMPARISON:  CT the head dated May 18, 2024. FINDINGS: Brain: There is no restricted diffusion to indicate acute or recent infarction. There is generalized cerebral volume loss present. There is mild diffuse cerebral white matter disease. There is no evidence of hemorrhage, mass, cortical infarct or hydrocephalus. Vascular: Normal flow voids. Skull and upper cervical spine: Normal marrow signal. No osseous lesions. Sinuses/Orbits: Opacification of the right sphenoid sinus. Mild mucosal disease in the floor of the left maxillary sinus. Status post bilateral lens replacement. Other: None. IMPRESSION: 1. Age-related atrophy and mild cerebral white matter disease. 2. Right sphenoid sinus disease. Electronically Signed   By: Evalene Coho M.D.   On: 05/18/2024 19:24   CT ANGIO HEAD NECK W WO CM Result Date: 05/18/2024 CLINICAL DATA:  Neuro deficit, acute, stroke suspected reported ataxia and facial droop EXAM: CT ANGIOGRAPHY HEAD AND NECK WITH CONTRAST TECHNIQUE: Multidetector CT imaging of the head and neck was performed using the standard protocol during bolus administration of intravenous contrast. Multiplanar CT image reconstructions and MIPs were obtained to evaluate the vascular anatomy. Carotid stenosis measurements (when applicable) are obtained utilizing NASCET criteria, using the distal internal carotid diameter as the denominator. RADIATION DOSE REDUCTION: This exam was performed according to the departmental dose-optimization program which  includes automated exposure control, adjustment of the mA and/or kV according to patient size and/or use of iterative reconstruction technique. CONTRAST:  75mL OMNIPAQUE  IOHEXOL  350 MG/ML SOLN COMPARISON:  CT of the head dated May 18, 2024. FINDINGS: CTA NECK FINDINGS Aortic arch: Standard branching. Imaged portion shows no evidence of aneurysm or dissection. No significant stenosis of the major arch vessel origins. Right carotid system: The common carotid arteries normal in caliber. There is calcific plaque present in the posterior wall of the proximal internal carotid artery, with less than 10% luminal stenosis. Left carotid system: The common carotid artery is normal in caliber and unremarkable. There is calcific plaque within the origin of the internal carotid artery, but no associated stenosis. Vertebral arteries: The vertebral arteries are codominant and normal in caliber. Skeleton: Mild-to-moderate degenerative disc disease and facet arthrosis. Other neck: Negative. Upper chest: Clear Review of the MIP images confirms the above findings CTA HEAD FINDINGS Anterior circulation: No significant stenosis, proximal occlusion, aneurysm, or vascular malformation. Posterior circulation: There is fetal type origin of the  right posterior cerebral artery. There is mild stenosis of the V4 segment of the right vertebral artery. The basilar artery is normal in caliber. Posterior cerebral arteries and the cerebellar arteries are patent and normal in caliber. Venous sinuses: Patent. Anatomic variants: Fetal type origin of the right posterior cerebral artery. Review of the MIP images confirms the above findings IMPRESSION: 1. Mild calcific plaque within the proximal internal carotid arteries with less than 10% stenosis bilaterally 2. Essentially negative CT angiogram of the head. Electronically Signed   By: Evalene Coho M.D.   On: 05/18/2024 14:55   CT HEAD WO CONTRAST Result Date: 05/18/2024 CLINICAL DATA:  confusion  EXAM: CT HEAD WITHOUT CONTRAST TECHNIQUE: Contiguous axial images were obtained from the base of the skull through the vertex without intravenous contrast. RADIATION DOSE REDUCTION: This exam was performed according to the departmental dose-optimization program which includes automated exposure control, adjustment of the mA and/or kV according to patient size and/or use of iterative reconstruction technique. COMPARISON:  May 23, 2023 FINDINGS: Brain: The ventricles appear age appropriate. No mass effect or midline shift. Gray-white differentiation is preserved.Scattered periventricular white matter hypoattenuation, most consistent with changes of mild chronic ischemic microvascular disease.No evidence of acute territorial infarction, extra-axial fluid collection, hemorrhage, or mass lesion. The basilar cisterns are patent without downward herniation. The cerebellar hemispheres and vermis are well formed without mass lesion or focal attenuation abnormality. Vascular: No hyperdense vessel. Calcified atherosclerotic plaque within the cavernous/supraclinoid internal carotid arteries. Skull: Normal. Negative for fracture or focal lesion. Sinuses/Orbits: Near-complete opacification of the right sphenoid sinus containing hyperdense material. Subtle thickening of the lateral sphenoid wall. The globes appear intact. No retrobulbar hematoma.Bilateral lens replacement. Other: None. IMPRESSION: 1. No acute intracranial abnormality, specifically, no acute hemorrhage, territorial infarction, or intracranial mass. 2. Near complete opacification of the right sphenoid sinus, which contains hyperdense material. This is worrisome for chronic fungal sinusitis and nonemergent follow-up with ENT should be considered. Electronically Signed   By: Rogelia Myers M.D.   On: 05/18/2024 14:15        Scheduled Meds:  buPROPion   150 mg Oral QPM   divalproex   250 mg Oral TID   DULoxetine   20 mg Oral QHS   pantoprazole   40 mg Oral  BID   sodium chloride  flush  3 mL Intravenous Q12H   Continuous Infusions:   LOS: 1 day    Time spent: 40 minutes    Renato Applebaum, MD [facility]

## 2024-05-19 NOTE — TOC Initial Note (Signed)
 Transition of Care [facility]) - Initial/Assessment Note    Patient Details  Name: [name] MRN: [id number] Date of Birth: 1955-11-14  Transition of Care [facility]) CM/SW Contact:    Andrez JULIANNA George, RN Phone Number: 05/19/2024, 12:05 PM  Clinical Narrative:                  Pt is from home with her spouse, daughter and mom. 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 Spouse works outside the home.  Pt has been managing her medications but spouse is going to over see them after d/c.  Spouse and daughter can provide needed transportation.  PCP: A [name].   IP Care Management following.  Expected Discharge Plan: Home/Self Care Barriers to Discharge: Continued Medical Work up   Patient Goals and CMS Choice            Expected Discharge Plan and Services   Discharge Planning Services: CM Consult   Living arrangements for the past 2 months: Single Family Home                                      Prior Living Arrangements/Services Living arrangements for the past 2 months: Single Family Home Lives with:: Spouse, Adult Children, Parents Patient language and need for interpreter reviewed:: Yes Do you feel safe going back to the place where you live?: Yes        Care giver support system in place?: Yes (comment) Current home services: DME (walker/ rollator/ shower seat) Criminal Activity/Legal Involvement Pertinent to Current Situation/Hospitalization: No - Comment as needed  Activities of Daily Living   ADL Screening (condition at time of admission) Independently performs ADLs?: Yes (appropriate for developmental age) Is the patient deaf or have difficulty hearing?: No Does the patient have difficulty seeing, even when wearing glasses/contacts?: No Does the patient have difficulty concentrating, remembering, or making decisions?: Yes  Permission Sought/Granted                  Emotional Assessment Appearance:: Appears stated age Attitude/Demeanor/Rapport:  Engaged Affect (typically observed): Accepting Orientation: : Oriented to Self, Oriented to Place, Oriented to  Time, Oriented to Situation   Psych Involvement: No (comment)  Admission diagnosis:  Ataxia [R27.0] Facial droop [R29.810] AMS (altered mental status) [R41.82] Patient Active Problem List   Diagnosis Date Noted   AMS (altered mental status) 05/18/2024   Mental status change resolved 09/09/2023   Medication adverse effect, sequela 06/20/2023   HUS (hemolytic uremic syndrome), atypical (HCC) 06/04/2023   MAHA (microangiopathic hemolytic anemia) (HCC) 06/03/2023   Thrombotic microangiopathy, unspecified (HCC) 06/03/2023   Diverticulitis 05/30/2023   Delirium due to multiple etiologies 05/23/2023   Dehydration 05/21/2023   Hypotension 05/13/2023   Chemotherapy induced diarrhea 05/08/2023   Chemotherapy-induced nausea 05/08/2023   Mucositis due to antineoplastic therapy 05/06/2023   Chemotherapy follow-up examination 03/25/2023   Hx of migraine headaches 03/13/2023   Diffuse nodular cirrhosis of liver (HCC) 01/28/2023   Colon cancer (HCC) 01/15/2023   Postural dizziness with presyncope 01/15/2023   Diarrhea 01/15/2023   Syncope and collapse 07/25/2022   Obesity (BMI 35.0-39.9 without comorbidity) 07/25/2022   Shortness of breath 07/25/2022   Abnormal glucose 07/16/2022   Achilles tendinitis of left lower extremity 07/16/2022   Arthritis 07/16/2022   Bilateral edema of lower extremity 07/16/2022   BMI 39.0-39.9,adult 07/16/2022   Cancer (HCC) 07/16/2022   Chronic vaginitis  07/16/2022   Chronic venous insufficiency 07/16/2022   Depression 07/16/2022   Fibromyalgia 07/16/2022   GERD without esophagitis 07/16/2022   History of TTP (thrombotic thrombocytopenic purpura) 07/16/2022   Hypokalemia 07/16/2022   Major depressive disorder, recurrent, moderate (HCC) 07/16/2022   Morbidly obese (HCC) 07/16/2022   MSSA (methicillin susceptible Staphylococcus aureus) infection  07/16/2022   Near syncope 07/16/2022   Osteoarthritis, chronic 07/16/2022   Postmenopausal 07/16/2022   Vitamin D deficiency 07/16/2022   Palpitations 03/04/2019   Dyslipidemia 03/04/2019   Atypical chest pain 04/09/2016   Anal fissure 11/30/2015   Lichen sclerosus et atrophicus of the vulva 11/30/2015   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   Snoring 11/22/2015   Fatigue 10/13/2015   Morbid obesity with BMI of 45.0-49.9, adult (HCC) 10/13/2015   Simple partial seizure disorder (HCC) 08/18/2014   History of operative procedure on hip 03/10/2012   Pain due to total hip replacement (HCC) 09/20/2011   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 [place] [address] Phone: [phone] Fax: [phone]  [facility] [address] [place] [address] Phone: [phone] Fax: [phone]     Social Drivers of Health (SDOH) Social History: SDOH Screenings   Food Insecurity: No Food Insecurity (05/18/2024)  Housing: Low Risk  (05/18/2024)  Transportation Needs: No Transportation Needs (05/18/2024)  Utilities: Not At Risk (05/18/2024)  Social Connections: Socially Integrated (05/18/2024)  Tobacco Use: Medium Risk (05/18/2024)   SDOH Interventions:     Readmission Risk Interventions     No data to display

## 2024-05-20 ENCOUNTER — Observation Stay (HOSPITAL_COMMUNITY)

## 2024-05-20 DIAGNOSIS — M79661 Pain in right lower leg: Secondary | ICD-10-CM

## 2024-05-20 DIAGNOSIS — R400 Somnolence: Secondary | ICD-10-CM | POA: Diagnosis not present

## 2024-05-20 LAB — ANA W/REFLEX IF POSITIVE: Anti Nuclear Antibody (ANA): NEGATIVE

## 2024-05-20 NOTE — TOC Transition Note (Signed)
 Transition of Care [facility]) - Discharge Note   Patient Details  Name: [name] MRN: [id number] Date of Birth: Nov 04, 1955  Transition of Care [facility]) CM/SW Contact:  Andrez JULIANNA George, RN Phone Number: 05/20/2024, 1:15 PM   Clinical Narrative:     Pt is discharging home with self care. No follow up per therapies.  Pt has supervision at home and transportation to home.  Final next level of care: Home/Self Care Barriers to Discharge: No Barriers Identified   Patient Goals and CMS Choice            Discharge Placement                       Discharge Plan and Services Additional resources added to the After Visit Summary for     Discharge Planning Services: CM Consult                                 Social Drivers of Health (SDOH) Interventions SDOH Screenings   Food Insecurity: No Food Insecurity (05/18/2024)  Housing: Low Risk  (05/18/2024)  Transportation Needs: No Transportation Needs (05/18/2024)  Utilities: Not At Risk (05/18/2024)  Social Connections: Socially Integrated (05/18/2024)  Tobacco Use: Medium Risk (05/18/2024)     Readmission Risk Interventions     No data to display

## 2024-05-20 NOTE — Progress Notes (Signed)
 BLE venous duplex has been completed.   Results can be found under chart review under CV PROC. 05/20/2024 12:48 PM Yoshino Broccoli RVT, RDMS

## 2024-05-20 NOTE — Progress Notes (Signed)
 ADAMTS13 sctivity 23.8. Opyd, MD notified.

## 2024-05-22 LAB — ADAMTS13 ANTIBODY: ADAMTS13 Antibody: 4 U/mL (ref <12)

## 2024-05-22 LAB — ADAMTS13 ACTIVITY: Adamts 13 Activity: 23.8 % — CL (ref >66.8)

## 2024-05-25 ENCOUNTER — Other Ambulatory Visit: Payer: Self-pay

## 2024-05-25 ENCOUNTER — Inpatient Hospital Stay: Attending: Oncology

## 2024-05-25 DIAGNOSIS — C184 Malignant neoplasm of transverse colon: Secondary | ICD-10-CM | POA: Diagnosis not present

## 2024-05-25 DIAGNOSIS — D594 Other nonautoimmune hemolytic anemias: Secondary | ICD-10-CM | POA: Diagnosis not present

## 2024-05-25 DIAGNOSIS — Z79899 Other long term (current) drug therapy: Secondary | ICD-10-CM | POA: Diagnosis not present

## 2024-05-25 DIAGNOSIS — D696 Thrombocytopenia, unspecified: Secondary | ICD-10-CM | POA: Diagnosis not present

## 2024-05-25 LAB — CBC WITH DIFFERENTIAL (CANCER CENTER ONLY)
Abs Immature Granulocytes: 0.02 K/uL (ref 0.00–0.07)
Basophils Absolute: 0 K/uL (ref 0.0–0.1)
Basophils Relative: 0 %
Eosinophils Absolute: 0.1 K/uL (ref 0.0–0.5)
Eosinophils Relative: 1 %
HCT: 36.9 % (ref 36.0–46.0)
Hemoglobin: 12.8 g/dL (ref 12.0–15.0)
Immature Granulocytes: 0 %
Lymphocytes Relative: 24 %
Lymphs Abs: 1.1 K/uL (ref 0.7–4.0)
MCH: 31.6 pg (ref 26.0–34.0)
MCHC: 34.7 g/dL (ref 30.0–36.0)
MCV: 91.1 fL (ref 80.0–100.0)
Monocytes Absolute: 0.6 K/uL (ref 0.1–1.0)
Monocytes Relative: 13 %
Neutro Abs: 2.8 K/uL (ref 1.7–7.7)
Neutrophils Relative %: 62 %
Platelet Count: 83 K/uL — ABNORMAL LOW (ref 150–400)
RBC: 4.05 MIL/uL (ref 3.87–5.11)
RDW: 12.4 % (ref 11.5–15.5)
WBC Count: 4.5 K/uL (ref 4.0–10.5)
nRBC: 0 % (ref 0.0–0.2)

## 2024-05-25 LAB — CEA (ACCESS): CEA (CHCC): 3.52 ng/mL (ref 0.00–5.00)

## 2024-05-26 ENCOUNTER — Telehealth: Payer: Self-pay | Admitting: Gastroenterology

## 2024-05-26 DIAGNOSIS — L97912 Non-pressure chronic ulcer of unspecified part of right lower leg with fat layer exposed: Secondary | ICD-10-CM | POA: Diagnosis not present

## 2024-05-26 NOTE — Telephone Encounter (Signed)
 Patient husband called and stated that she would like to know what she needs to do in order to stop her sensation of urgency when it come to having a BM. Patient husband is requesting a call back. Please advise.

## 2024-05-27 NOTE — Telephone Encounter (Signed)
 Pt husband Dick  stated that the pt has been having Issues with urgency with BM's since her colon surgery back in may 2024. Dick stated that this  is not a new issue but ongoing. Pt was scheduled to see Deanna May NP on 06/02/2024 at 1:30 PM. Dick made aware.  Dick verbalized understanding with all questions answered.

## 2024-05-27 NOTE — Telephone Encounter (Addendum)
 Pt husband  Dick was contacted and stated that he had no further questions. Appointment previously scheduled was confirmed with pt.  Dick  verbalized understanding with all questions answered.

## 2024-05-27 NOTE — Progress Notes (Signed)
 [facility] RETURN PATIENT EVALUATION   Primary Care Physician:  Greig Darin Feeling, NP  05/28/2024   Assessment and Plan: 1. Malignant neoplasm of transverse colon    (CMD) (Primary) - Ambulatory referral to Nutrition Services  2. 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 Concern about memory - Ambulatory referral to Nutrition Services  4. Atypical migraine  5. Snoring  6. TIA (transient ischemic attack)    Differential diagnosis of spells of speech arrest continues to be complicated migraine versus atypical seizure.  The spells have resolved with higher doses of Depakote .  She will continue her same dose and keep appointment for ambulatory EEG as scheduled.   Her thrombocytopenia is felt to be due to chemotherapy not Depakote .  She is scheduled for repeat blood work with her oncologist.  If platelets fall further may need to discontinue VPA. Patient continues to have memory issues and positive blood work for amyloid indicates she is at risk for Alzheimer's disease.  Doubt she will be a candidate for antiamyloid monoclonal antibodies given her persistent thrombocytopenia.  She is [facility] Recent episode of imbalance concerning for TIA.  Evaluation at [facility] revealed a PFO but no evidence of DVT.  Recommend the patient discuss with her hematologist whether she could take baby aspirin  daily to lower risk of recurrent stroke.  Also asked her to follow-up with her cardiologist in [place].  At this point do not strongly feel she needs to have PFO closed but if she were to have recurrent episodes of TIA this may be necessary. No record of LDL in her chart.  Recommend this be drawn by her oncologist or PCP at next visit.  Will refer to nutritionist for help in getting adequate protein into her diet without further weight gain  Consider repeat sleep study in the  future Return for keep appt 9/10.   HPI:  [name] is a 68 y.o. female who is seen in the Neurology Outpatient Clinic for episodes of transient alteration of awareness.  05/27/2024: At last visit patient has been agreed to ambulatory EEG for continued spells. This is scheduled later this month.  I recommended she discontinue nortriptyline as it might worsen cognition, she has done so.  She agreed to P Tau 217 testing for amyloid which returned positive.  She was referred to the [facility] disorder clinic but has not been seen; husband says she has to mail in paperwork.  Husband reports she has not had any more spells of speech arrest. However she had a different episode resulting in hospitalization as reported below.  Pt states she suddenly felt off balance and she could not get up the steps. Husband reports she was weak in left arm and leg but started to improve the next day. She still feels her balance and vision are still a little off.   The patient was admitted recently to [facility] from 7/28 to 05/20/2024.  I reviewed the discharge summary.  She presented with difficulty with balance and feeling foggy.  In the ER there was question of right facial droop.  Workup included: CT head without contrast, fairly normal. Does have some evidence of some sinusitis. CT angiogram head and neck with contrast, no large vessel occlusion. MRI of the brain, fairly normal. No evidence of acute infarction or ischemia. 2D echocardiogram, normal ejection fraction. Large PFO-discussed with cardiology-currently without any significance and requested to follow-up  with neurology if that is attributing to her symptoms she can be referred to structural heart team for closure. D-dimer borderline elevated. VQ scan with intermediate probability of blood clot-CT angiogram negative for thromboembolism. Lower extremity duplex is negative.  Valproic acid  level 71, normal TSH, reactive hepatitis C antibodies,  A1c 4.1, normal T4 She was discharged to follow-up neurology in consult with the ENT for sinusitis.  Patient reports she was also told her protein level was quite low and that she should consider seeing a nutritionist.  Total protein was 5.  Pt reports she can fall  asleep at the drop of the hat but sometimes she can't sleep at all.  She reports she had a sleep study a few years ago that was negative.  She snores but no witnessed apnea.  Sometimes pt can see a 3rd dimension on the floor that moves but no other hallucinations.  04/22/2024: At last visit patient agreed with ambulatory EEG.   Her Depakote  dose was increased to 250 mg 3 times daily.  Patient called on 04/20/2024 stating she was having episodes again but they consisted of her wanting to isolate herself.  She is able to respond normally to her husband during the spells.    Today pt comes with husband who contributes to history.  They reports spells had stopped so pt cancelled EEG.  However in past week they have recurred.  She experiences intense head pressure and pain, triggered by bright light or certain smells, lasting 30 seconds to 5 minutes. Episodes include chest pressure, dizziness, and suffocation sensation without loss of consciousness. She may slump over but she does not lose consciousness.  She has 5-8 episodes/day.   No specific triggers identified.  Nothing changed when spells recurred.   She continues to feel her memory is poor.  She completed chemotherapy.  She is taking nortriptyline prescribed by PCP years ago for migraine. Platelet count is remaining stable in high 80s. Mother had SDAT   01/08/2024: At last visit patient continued valproic acid  250 mg twice daily.  Her husband sent a message 2 days ago that she was having increased number of spells and requested an earlier appointment.  He was instructed to increase Depakote  to 250 mg 3 times daily in the interim.  Patient comes to clinic today accompanied by her adopted  daughter.  She reports that she had 8 episodes in a 24-hour.  Episodes are similar as or previously described.  She cannot identify any current triggers.  Episodes are an intense sensation in her nose and/or sinuses during which time she cannot respond.  Symptoms last 30 seconds to 1.5 minutes.  She is conscious during the episodes and can recall them afterwards.  There have been no recent changes in her medications.  Her husband manages her medicines but she believes she has been taking Depakote  regularly. 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  Last platelet count was 114 on 2/13.  CMP was unremarkable Patient reports her husband is concerned she is having seizures and has colon cancer.  Her daughter who accompanies her today reports that the patient was having similar burning sensations in her chest prior to the diagnosis of colon cancer.  09/04/2023: At last visit patient opted to continue Depakote  as migraine prophylactic.  We agreed to consider neuropsychological testing once she completed chemotherapy if her memory deficits did not improve.  Pt reports she has had 2 spells in the  last 2 weeks where she has a strange smell and then she has a crashing headache.  HA lasts only a few minutes.  She can't tolerate any kind of stimulation (lights, noise) during HA.  Husband reports she ran out of VPA for a few weeks and they believe that spells occurred when she was out of med.  Prior to being out of VPA she had not had spell in months.  She has completed chemo and brain fog is better.   She is now being treated for TTP.  I reviewed [facility] hematology note and labs.  Platelet count was 136 in April. Dropped to 30 in August and rebounded to 101 in October.  Patient was admitted to [facility] in August 2024 for altered mental status and diarrhea.  Initially she was felt to be having possible 5-FU toxicity but later was  diagnosed with microangiopathic hemolytic anemia.  The patient had previously been diagnosed with TTP in 1989.  No mention in the clinic note from August of discontinuing Depakote .  03/19/2023: At last visit I felt the patient's spells might be related to carcinoid syndrome.  An evaluation for seizures was ordered and B12 was ordered.  EEG showed mild slowing in the left greater than right temporal lobes but no evidence of seizure disorder.  Pt states she tried to trigger spells on the test but could not.  MRI brain was unremarkable.  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  Chromogen levels fell spontaneously and so PET scan was not performed.  Patient has started chemotherapy.  She reports after starting VPA spells stopped   She then stopped VPA and had several spells a day that were her typical spell. She went back on VPA and hasn't had a spell in about 2 weeks.  No ADR that she knows of.  Tremor has not worsened that she has noted.  She reports one of her other doctors told her they felt her episodes were migraine.  She reports she is having more HA due to her chemo and social stressors. HA are triggered by lack of sleep.   She still notes forgetfulness.  She will put something down and then be unable to remember where she placed it.  Her husband does not feel that this is serious problem.  02/26/2023 EEG IMPRESSION: There was evidence for mild focal cerebral dysfunction over the left temporal region and minimally over the right temporal region which is nonspecific as to etiology but can be seen with underlying structural/neurophysiological disturbances.  There was no specific evidence for seizures during this recording although this does not completely rule out an underlying risk for seizures and clinical correlation is required.  02/15/23 MRI brain with and without contrast IMPRESSION: 1. Normal for age MRI appearance of the Brain. No  metastatic disease or acute intracranial abnormality. 2. New right sphenoid sinus disease since 2015.  02/12/2023 HPI:  Ms. [name] is a 68 y.o. female referred for transient alteration of awareness.  I reviewed [facility] oncology notes.  She has Medical history notable for septic arthritis of the hip, obesity, atypical chest pain, skin cancer, carpal tunnel syndrome, chronic vaginitis, chronic venous and sufficiency, TTP, syncope.  Patient was recently diagnosed with colon cancer and underwent colon resection on 02/04/2023.  Chomogranin levels were high and pt states she will find out treatment plan tomorrow.  Per oncology note she was to be scheduled for a PET/CT if her chromogen levels  were still elevated   Pt reports she has sensation of a smell she can't describe and an instant excruciating HA.  She covers her eyes.  Pain will move to her chest and sometimes back up to her head.  She does not lose consciousness and can talk during spell. She also feels dizzy and nausea.  Husband does not notice flushing, pt denies sweating.  She has not fallen.  Spells occur several times in a day or she may go weeks without a spell.  Spells have been occurring on and off for a year but are more frequent this year.  Two spells seemed to be triggered by strong smells but no other triggers she can identify.  Husband and pt report her STM is not good. She will misplace things or forget what she is doing.  She does not repeat herself.   Patient went to [facility] ER with the symptoms in March.  ER notes are not available but she reports she had a CT scan of her head that was negative.   She is a retired Air traffic controller. No h/o concussions  No h/o alcohol or drug abuse Mother has epilepsy and memory loss.  Father  had dementia that she thinks was due to industrial chemical/radiation exposure. Brother is normal.     Current Outpatient Medications:  .  buPROPion  (WELLBUTRIN  XL) 150 mg 24 hr tablet, Take 150 mg by mouth  in the morning., Disp: , Rfl:  .  clobetasoL (TEMOVATE) 0.05 % cream, Apply topically 2 (two) times a day as needed. *vaginally as needed for irritation, Disp: , Rfl: 0 .  clotrimazole-betamethasone (LOTRISONE) 1-0.05 % cream, Apply daily as needed for rash & irritation, Disp: , Rfl:  .  cyclobenzaprine (FLEXERIL) 5 mg tablet, Take 5-10 mg by mouth 2 (two) times a day as needed for muscle spasms., Disp: , Rfl:  .  dexamethasone -nystatin-diphenhydramine  (MAGIC MOUTHWASH) suspension, Use orally as needed for mouth pain/thrush, Disp: , Rfl:  .  divalproex  (DEPAKOTE  DR) 500 mg 12 hr tablet, Take 1 tablet (500 mg total) by mouth 2 (two) times a day., Disp: 60 tablet, Rfl: 5 .  DULoxetine  (CYMBALTA ) 60 mg capsule, Take 60 mg by mouth daily., Disp: , Rfl:  .  esomeprazole (NexIUM) 40 mg DR capsule, Take 1 capsule by mouth daily., Disp: , Rfl:  .  ibuprofen (MOTRIN) 200 mg tablet, Take 400 mg by mouth every 8 (eight) hours as needed for mild pain (1-3), headaches or fever 100.4 F or GREATER., Disp: , Rfl:  .  loperamide (IMODIUM) 2 mg tablet/capsule, Take 1 each (2 mg total) by mouth 4 (four) times a day as needed for diarrhea., Disp: 30 tablet, Rfl: 0 .  MULTIVITAMIN ORAL, Take 1 tablet by mouth daily., Disp: , Rfl:  .  ondansetron  (ZOFRAN ) 8 mg tablet, Take 8 mg by mouth every 8 (eight) hours as needed for nausea or vomiting., Disp: , Rfl:  .  potassium chloride  20 mEq ER tablet, Take 20 mEq by mouth 2 (two) times a day., Disp: , Rfl:  .  prochlorperazine  (COMPAZINE ) 10 mg tablet, Take 10 mg by mouth every 6 (six) hours as needed for vomiting or nausea., Disp: , Rfl:  .  SUMAtriptan (IMITREX) 25 mg tablet, Take 25-50 mg by mouth daily as needed for migraine., Disp: , Rfl:  .  nortriptyline (PAMELOR) 10 mg capsule, Take 20 mg by mouth at bedtime. (Patient not taking: Reported on 05/28/2024), Disp: , Rfl:  .  triamterene -hydroCHLOROthiazide (DYAZIDE)  37.5-25 mg per capsule, , Disp: , Rfl:   Latex, natural  rubber and Adhesive tape-silicones    Past, family, social histories reviewed and updated as needed   PHYSICAL EXAM: BP 115/69   Pulse 78   Resp 12   Ht 1.676 m (5' 6)   Wt 101 kg (222 lb)   BMI 35.83 kg/m  GENERAL:  No acute distress.  SKIN:   Inspection: well perfused, no edema.   MENTAL STATUS EXAM: Orientation: Alert and oriented   Memory: Registers 3 items and recalls 3 at 3 minutes  attention, concentration: Attention span and concentration are normal. Language: Speech is clear and language is normal. Fund of knowledge: Aware of current events, vocabulary appropriate for patient age.   CRANIAL NERVES:   CN 3,4,6 (EOM): Pupils equal and reactive to light. Full extraocular eye movement without nystagmus. CN 7 (Facial): No facial weakness or asymmetry. CN 8 (Auditory): Auditory acuity grossly normal. CN 9,10 (Glossophar): The uvula is midline, the palate elevates symmetrically. CN 11 (spinal access): Normal sternocleidomastoid and trapezius strength. CN 12 (Hypoglossal): The tongue is midline. No atrophy or fasciculations.SABRA  MOTOR: Strength: normal in the upper and lower extremities, no pronation or drift. Normal tone and bulk.  COORDINATION:   Mild intention tremor left FNF  GAIT: Routine gait cautious    DATA REVIEWED:      Electronically signed by: Damien MARLA Bull, MD 05/28/2024 10:00 AM  I personally spent 60 minutes in total on 05/28/2024 for both face-to-face care of Terisha as well as non face-to-face time including reviewing of the chart, counseling and educating the family, placing orders, coordination of care, personal review of results as detailed above, and completing documentation.

## 2024-05-27 NOTE — Telephone Encounter (Signed)
 Pt is returning a call back to [place]. Patient is requesting a call back. Please advise.

## 2024-05-28 ENCOUNTER — Encounter (INDEPENDENT_AMBULATORY_CARE_PROVIDER_SITE_OTHER): Payer: Self-pay

## 2024-05-28 DIAGNOSIS — Z711 Person with feared health complaint in whom no diagnosis is made: Secondary | ICD-10-CM | POA: Diagnosis not present

## 2024-05-28 DIAGNOSIS — G43009 Migraine without aura, not intractable, without status migrainosus: Secondary | ICD-10-CM | POA: Diagnosis not present

## 2024-05-28 DIAGNOSIS — G459 Transient cerebral ischemic attack, unspecified: Secondary | ICD-10-CM | POA: Diagnosis not present

## 2024-05-28 DIAGNOSIS — D61810 Antineoplastic chemotherapy induced pancytopenia: Secondary | ICD-10-CM | POA: Diagnosis not present

## 2024-05-28 DIAGNOSIS — R0683 Snoring: Secondary | ICD-10-CM | POA: Diagnosis not present

## 2024-05-28 DIAGNOSIS — C184 Malignant neoplasm of transverse colon: Secondary | ICD-10-CM | POA: Diagnosis not present

## 2024-05-31 NOTE — Progress Notes (Unsigned)
 [facility] at [facility] [address] [place],  [place]  [address] [phone]  Clinic Day:  06/01/2024  Referring physician: Erick Greig LABOR, NP   HISTORY OF PRESENT ILLNESS:  The patient is a 68 y.o. 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  She received 4 of 6 planned cycles of Xeloda .  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  At her last visit, as her hemoglobin was much better and her LDH was normal, her Ravulizumab  was discontinued.  She comes in today to reassess her colon cancer, as well as her microangiopathic hemolytic anemia.  Since her last visit, the patient has been doing fairly well.  There has been a recent history of her having what appears to have been a TIA.  The patient claims her mentation is better today, but she does have occasional episodes of foggy thinking.  She is scheduled for an EEG in the forthcoming weeks.  As it pertains to her colon cancer, she denies having any new GI symptoms which concern her for disease recurrence.  Furthermore, a postsurgical colonoscopy done in June 2025 showed no recurrent cancer in her lower GI tract.  As it pertains to her microangiopathic hemolytic anemia, she denies having increased fatigue.  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  PHYSICAL EXAM:  Blood pressure 113/74, pulse 82, temperature 97.8 F (36.6 C), temperature source Oral, resp. rate 16, height 5' 5.9 (1.674 m), weight 222 lb 8 oz (100.9 kg), last menstrual period 04/05/2011, SpO2 100%. Wt Readings from Last 3 Encounters:  06/01/24 222 lb 8 oz (100.9 kg)  05/19/24 225 lb 8.5 oz (102.3 kg)  03/10/24 230 lb (104.3 kg)   Body mass index is 36.02 kg/m. Performance status (ECOG): 1 - Symptomatic but completely ambulatory Physical  Exam Constitutional:      Appearance: Normal appearance. She is not ill-appearing.  HENT:     Mouth/Throat:     Mouth: Mucous membranes are moist.     Pharynx: Oropharynx is clear. No oropharyngeal exudate or posterior oropharyngeal erythema.  Cardiovascular:     Rate and Rhythm: Normal rate and regular rhythm.     Heart sounds: No murmur heard.    No friction rub. No gallop.  Pulmonary:     Effort: Pulmonary effort is normal. No respiratory distress.     Breath sounds: Normal breath sounds. No wheezing, rhonchi or rales.  Abdominal:     General: Bowel sounds are normal. There is no distension.     Palpations: Abdomen is soft. There is no mass.     Tenderness: There is no abdominal tenderness.  Musculoskeletal:        General: No swelling.     Right lower leg: No edema.     Left lower leg: No edema.  Lymphadenopathy:     Cervical: No cervical adenopathy.     Upper Body:     Right upper body: No supraclavicular or axillary adenopathy.     Left upper body: No supraclavicular or axillary adenopathy.     Lower Body: No right inguinal adenopathy. No left inguinal adenopathy.  Skin:    General: Skin is warm.     Coloration: Skin is not jaundiced.     Findings: No lesion or rash.  Neurological:     General: No focal  deficit present.     Mental Status: She is alert and oriented to person, place, and time. Mental status is at baseline.  Psychiatric:        Mood and Affect: Mood normal.        Behavior: Behavior normal.        Thought Content: Thought content normal.    LABS:      Latest Ref Rng & Units 05/25/2024   10:08 AM 05/19/2024    4:45 AM 05/18/2024    5:10 PM  CBC  WBC 4.0 - 10.5 K/uL 4.5  4.2  4.4   Hemoglobin 12.0 - 15.0 g/dL 87.1  88.0  87.7   Hematocrit 36.0 - 46.0 % 36.9  34.5  35.8   Platelets 150 - 400 K/uL 83  80  84       Latest Ref Rng & Units 05/19/2024    4:45 AM 05/18/2024   12:22 PM 01/30/2024   10:10 AM  CMP  Glucose 70 - 99 mg/dL 83  98  88   BUN 8  - 23 mg/dL 11  16  15    Creatinine 0.44 - 1.00 mg/dL 9.31  9.20  9.12   Sodium 135 - 145 mmol/L 141  141  143   Potassium 3.5 - 5.1 mmol/L 3.8  4.1  4.4   Chloride 98 - 111 mmol/L 108  107  107   CO2 22 - 32 mmol/L 26  27  26    Calcium 8.9 - 10.3 mg/dL 9.0  9.6  9.6   Total Protein 6.5 - 8.1 g/dL 5.4  6.2  6.7   Total Bilirubin 0.0 - 1.2 mg/dL 0.7  0.6  0.4   Alkaline Phos 38 - 126 U/L 47  63  102   AST 15 - 41 U/L 19  20  20    ALT 0 - 44 U/L 12  14  16      Latest Reference Range & Units 05/25/24 10:08  CEA ([facility]) 0.00 - 5.00 ng/mL 3.52    ASSESSMENT & PLAN:  Assessment/Plan:  A 68 y.o. 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  Based upon her labs today and her recent colonoscopy, the patient remains disease-free, as it pertains to her colon cancer.  As both her LDH and hemoglobin levels are normal, it appears her microangiopathic hemolytic anemia has resolved.  The patient does have baseline thrombocytopenia, which may very well be related to her known liver disease.  This will continue to be followed over time. Overall, from both a hematologic and oncologic standpoint, the patient is doing well.  I will see this patient back in 4 months for repeat clinical assessment.  The patient understands all the plans discussed today and is in agreement with them.  [place] Chelf DELENA Kerns, MD

## 2024-06-01 ENCOUNTER — Other Ambulatory Visit: Payer: Self-pay | Admitting: Oncology

## 2024-06-01 ENCOUNTER — Inpatient Hospital Stay: Admitting: Oncology

## 2024-06-01 VITALS — BP 113/74 | HR 82 | Temp 97.8°F | Resp 16 | Ht 65.9 in | Wt 222.5 lb

## 2024-06-01 DIAGNOSIS — C184 Malignant neoplasm of transverse colon: Secondary | ICD-10-CM

## 2024-06-01 DIAGNOSIS — Z79899 Other long term (current) drug therapy: Secondary | ICD-10-CM | POA: Diagnosis not present

## 2024-06-01 DIAGNOSIS — D696 Thrombocytopenia, unspecified: Secondary | ICD-10-CM | POA: Diagnosis not present

## 2024-06-01 DIAGNOSIS — D594 Other nonautoimmune hemolytic anemias: Secondary | ICD-10-CM | POA: Diagnosis not present

## 2024-06-02 ENCOUNTER — Encounter: Payer: Self-pay | Admitting: Gastroenterology

## 2024-06-02 ENCOUNTER — Ambulatory Visit: Admitting: Gastroenterology

## 2024-06-02 VITALS — BP 104/62 | HR 82 | Ht 66.0 in | Wt 223.5 lb

## 2024-06-02 DIAGNOSIS — Z85038 Personal history of other malignant neoplasm of large intestine: Secondary | ICD-10-CM

## 2024-06-02 DIAGNOSIS — D696 Thrombocytopenia, unspecified: Secondary | ICD-10-CM | POA: Diagnosis not present

## 2024-06-02 DIAGNOSIS — Z87891 Personal history of nicotine dependence: Secondary | ICD-10-CM

## 2024-06-02 DIAGNOSIS — R197 Diarrhea, unspecified: Secondary | ICD-10-CM

## 2024-06-02 DIAGNOSIS — K7460 Unspecified cirrhosis of liver: Secondary | ICD-10-CM | POA: Diagnosis not present

## 2024-06-02 NOTE — Progress Notes (Signed)
 Chief Complaint:diarrhea  Primary GI Doctor:Dr. Charlanne  HPI:  Patient is a  68  year old female patient with past medical history of Nl EF 60-65% (2DE 02/2019), H/O fatty liver  (Nl LFTs with nl alb/plt 06/2022), H/O vasovagal syncope (neg cardiac WU 07/2022), septic arthritis of the hip, obesity, atypical chest pain, skin cancer, carpal tunnel syndrome, chronic vaginitis, chronic venous and sufficiency, TTP, syncope, who presents for follow-up of diarrhea. Patient last seen by Dr. Charlanne on 01/21/2024 for persistent diarrhea following her colon surgery.  Stool studies: fecal calprotectin 71, diatherix negative  Patient seen in ED on 05/18/24 for weakness and SOB. Labs show: normal CMP except total protein 6.2, hgb 12.7, PLT 93, UA negative, TSH 1.336,  D dimer 0.56 CT head without contrast, fairly normal. Does have some evidence of some sinusitis. CT angiogram head and neck with contrast, no large vessel occlusion. MRI of the brain, fairly normal.  No evidence of acute infarction or ischemia. 2D echocardiogram, normal ejection fraction.  Large PFO-discussed with cardiology-currently without any significance and requested to follow-up with neurology if that is attributing to her symptoms she can be referred to structural heart team for closure. D-dimer borderline elevated.  VQ scan with intermediate probability of blood clot-CT angiogram negative for thromboembolism.  Lower extremity duplex is negative. Clinically improved.  Going home.  Avoiding diuretics.  Compression socks both lower extremities.  Outpatient follow-up.  06/02/24 seen for follow-up by oncology.  Past GI workup: 03/10/24 colonoscopy, recall 3 years  - One 6 mm polyp in the mid ascending colon, removed with a cold snare. Resected and retrieved. - Patent end- to- end colo- colonic anastomosis, characterized by healthy appearing mucosa. - Non- bleeding internal hemorrhoids. The examination was otherwise normal on direct and retroflexion  views. Path: 2. Surgical [P], colon, ascending, polyp (1) :       - SESSILE SERRATED POLYP(S) WITHOUT CYTOLOGIC DYSPLASIA, SEE NOTE        Diagnosis Note : Immunostain for MLH1 does not show definite evidence of       cytologic dysplasia.   03/10/24 EGD Mild Portal hypertensive gastropathy. - Mild antral gastritis. Path:  1. Surgical [P], gastric antrum :       - GASTRIC ANTRAL MUCOSA WITH MILD NONSPECIFIC REACTIVE GASTROPATHY       - GASTRIC OXYNTIC MUCOSA WITH PARIETAL CELL HYPERPLASIA AS CAN BE SEEN IN       HYPERGASTRINEMIC STATES SUCH AS PPI THERAPY.       - HELICOBACTER PYLORI-LIKE ORGANISMS ARE NOT IDENTIFIED ON ROUTINE H&E STAIN  Colonoscopy 12/2022 tumor in the distal transverse colon. Biopsied. Tattooed. - One 10 mm polyp in the proximal ascending colon, removed with a hot snare and removed with a cold snare. Resected and retrieved. - Mild sigmoid diverticulosis. - Non- bleeding internal hemorrhoids. - The examination was otherwise normal on direct and retroflexion views.   Interval History    Patient presents for follow-up, accompanied by her husband. Patient has had issues with brain fog for the past year following her cancer treatment, therefore her husband helped with the assessment and history. She is pending 3 day EEG test with neurologist. Patient has continued with loose stools with urgency and incontinence insert bowel resection back in April 2024.  Dr. Charlanne recently ordered stool studies and EGD with colonoscopy which we have reviewed the results today.  Patient reports she has 5 or more stools per day which is her norm however she has had worsening issues  with urgency and wears a panty liner for accidents. No new medications. She denies trying anything for the diarrhea, therefore we discussed starting with fiber supplementation. Also spent several minutes discussing certain food triggers they Bobetta Korf increase stool count. No abdominal pain or blood in stool.     We also  discussed the fatty liver disease.  Patient recently had the upper GI endoscopy with mild portal hypertensive gastropathy.  No varices.  Patient denies any known history of fatty liver disease.  Patient recently checked for HCV Ab and reactive, patient denies history of hepatitis C.  Patient states she very seldom drinks.  No family history of liver disease or liver cancer.  Patient reports she has history of TTP diagnosed several years ago when she was younger.    Wt Readings from Last 3 Encounters:  06/01/24 222 lb 8 oz (100.9 kg)  05/19/24 225 lb 8.5 oz (102.3 kg)  03/10/24 230 lb (104.3 kg)     Past Medical History:  Diagnosis Date   Abnormal glucose    Achilles tendinitis of left lower extremity    Acute suppurative arthritis due to bacteria (HCC) 03/09/2011   Overview:  Last Assessment & Plan:  Methicillin sensitive staphylococcal aureus septic hip. She clearly does need I&D of the hip. I will have her continue the doxycycline  for now but stop it 7 days prior to her surgery to maximize the yield on the cultures in the operating room though I be shocked if we don't find methicillin sensitive staph aureus again.. I agree with removing as much of the pros   Anal fissure 11/30/2015   Arthritis    right hip   Atypical chest pain 04/09/2016   Bilateral edema of lower extremity    BMI 39.0-39.9,adult    Cancer [facility])    SKIN CANCER   Carpal tunnel syndrome 03/09/2011   Overview:  Last Assessment & Plan:  Clinically this seems to be carpal tunnel syndrome. Giving given her history of infection though we can keep in the back of our mind the idea that she will have a cervical spine problem but I think this is unlikely at this point in time medics carpal tunnel syndrome fits clinically this was a positive Tinel's sign however in for a brace for her.   Chronic vaginitis    Chronic venous insufficiency    Depression    Dyslipidemia    Fatigue 10/13/2015   Fibromyalgia    GERD without  esophagitis    History of immune thrombocytopenia 03/09/2011   History of operative procedure on hip 03/10/2012   History of TTP (thrombotic thrombocytopenic purpura)    Hypokalemia    Lichen sclerosus et atrophicus of the vulva 11/30/2015   Major depressive disorder, recurrent, moderate (HCC)    Methicillin susceptible Staphylococcus aureus infection 03/09/2011   Overview:  Last Assessment & Plan:  This is undoubtedlythe culprit organism   Migraine    Morbid obesity with BMI of 45.0-49.9, adult (HCC) 10/13/2015   Morbidly obese (HCC)    MSSA (methicillin susceptible Staphylococcus aureus) infection    Near syncope    Osteoarthritis, chronic    Pain due to total hip replacement (HCC) 09/20/2011   Palpitations 03/04/2019   Postmenopausal    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 syndrome (HCC)    20 yrs ago-not seen anyone for 10 yrs.   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 Mild diverticulosis. Small internal hemorrhoids. Healed anal fissure   ENDOVENOUS ABLATION SAPHENOUS VEIN W/ LASER Right 04/02/2019   endovenous laser ablation right greater saphenous vein by Lonni Blade MD    JOINT REPLACEMENT  08/2009   right hip replacement   REVISION TOTAL HIP ARTHROPLASTY     TONSILLECTOMY  1963   TOTAL HIP REVISION  10/08/2011   Procedure: TOTAL HIP REVISION;  Surgeon: Donnice JONETTA Car;  Location: [facility];  Service: Orthopedics;  Laterality: Right;  Resection of Right Total Hip/Extended Trochanteric Osteotomy/Placement of Antibiotic Total Hip Cemented by Depuy   TOTAL HIP REVISION  03/10/2012   Procedure: TOTAL HIP REVISION;  Surgeon: Donnice JONETTA Car, MD;  Location: [facility];  Service: Orthopedics;  Laterality: Right;  Reimplantation/Revision of a Right Total Hip and Removal of  Cemented Implant    Current Outpatient Medications  Medication Sig Dispense Refill   buPROPion  (WELLBUTRIN  XL) 150 MG 24 hr tablet Take 150 mg by mouth every evening.     divalproex  (DEPAKOTE ) 250 MG DR tablet Take 250 mg by mouth in the morning, at noon, in the evening, and at bedtime.     DULoxetine  (CYMBALTA ) 60 MG capsule Take 60 mg by mouth at bedtime.      esomeprazole (NEXIUM) 40 MG capsule Take 40 mg by mouth daily. Pt states has been on for years     Multiple Vitamin (MULTIVITAMIN WITH MINERALS) TABS tablet Take 1 tablet by mouth daily.     No current facility-administered medications for this visit.    Allergies as of 06/02/2024 - Review Complete 06/01/2024  Allergen Reaction Noted   Bactrim [sulfamethoxazole-trimethoprim] Hives 04/30/2024   Aloe vera Other (See Comments) 03/10/2024    Family History  Problem Relation Age of Onset   Hypertension Mother    Hypertension Father    Alzheimer's disease Father    Prostate cancer Father    Hypertension Maternal Grandfather    Seizures Other    Colon cancer Neg Hx    Stomach cancer Neg Hx    Rectal cancer Neg Hx    Esophageal cancer Neg Hx     Review of Systems:    Constitutional: No weight loss, fever, chills, weakness or fatigue HEENT: Eyes: No change in vision               Ears, Nose, Throat:  No change in hearing or congestion Skin: No rash or itching Cardiovascular: No chest pain, chest pressure or palpitations   Respiratory: No SOB or cough Gastrointestinal: See HPI and otherwise negative Genitourinary: No dysuria or change in urinary frequency Neurological: No headache, dizziness or syncope Musculoskeletal: No new muscle or joint pain Hematologic: No bleeding or bruising Psychiatric: No history of depression or anxiety    Physical Exam:  Vital signs: LMP 04/05/2011   Constitutional:   Pleasant female appears to be in NAD, Well developed, Well nourished, alert and cooperative Throat: Oral cavity and  pharynx without inflammation, swelling or lesion.  Respiratory: Respirations even and unlabored. Lungs clear to auscultation bilaterally.   No wheezes, crackles, or rhonchi.  Cardiovascular: Normal S1, S2. Regular rate and rhythm. +2 peripheral edema, discoloration to bilateral legs Gastrointestinal:  Soft, nondistended, nontender. No rebound or guarding. Normal bowel sounds. No appreciable masses or hepatomegaly. Rectal:  Not performed.  Msk:  Symmetrical without gross deformities. Without edema, no deformity or joint abnormality.  Neurologic:  Alert  and  oriented x4;  grossly normal neurologically.  Skin:  dressing on right shin  RELEVANT LABS AND IMAGING: CBC    Latest Ref Rng & Units 05/25/2024   10:08 AM 05/19/2024    4:45 AM 05/18/2024    5:10 PM  CBC  WBC 4.0 - 10.5 K/uL 4.5  4.2  4.4   Hemoglobin 12.0 - 15.0 g/dL 87.1  88.0  87.7   Hematocrit 36.0 - 46.0 % 36.9  34.5  35.8   Platelets 150 - 400 K/uL 83  80  84      CMP     Latest Ref Rng & Units 05/19/2024    4:45 AM 05/18/2024   12:22 PM 01/30/2024   10:10 AM  CMP  Glucose 70 - 99 mg/dL 83  98  88   BUN 8 - 23 mg/dL 11  16  15    Creatinine 0.44 - 1.00 mg/dL 9.31  9.20  9.12   Sodium 135 - 145 mmol/L 141  141  143   Potassium 3.5 - 5.1 mmol/L 3.8  4.1  4.4   Chloride 98 - 111 mmol/L 108  107  107   CO2 22 - 32 mmol/L 26  27  26    Calcium 8.9 - 10.3 mg/dL 9.0  9.6  9.6   Total Protein 6.5 - 8.1 g/dL 5.4  6.2  6.7   Total Bilirubin 0.0 - 1.2 mg/dL 0.7  0.6  0.4   Alkaline Phos 38 - 126 U/L 47  63  102   AST 15 - 41 U/L 19  20  20    ALT 0 - 44 U/L 12  14  16       Lab Results  Component Value Date   TSH 1.336 05/18/2024  05/18/24 ANA negative , hepatitis C quant not detected, HCV antibody with reflex reactive   Imaging: 05/19/24 US  Abdomen complete IMPRESSION: 1. Chronic Cholelithiasis. No evidence of acute cholecystitis. Liver and bile ducts within normal limits by ultrasound. 2. Otherwise negative ultrasound  appearance of the abdomen.  Fibrosis 4 Score = 4.43  Fib-4 interpretation is not validated for people under 35 or over 52 years of age. However, scores under 2.0 are generally considered low risk.   MELD 3.0: 8 at 05/19/2024  4:45 AM MELD-Na: 6 at 05/19/2024  4:45 AM Calculated from: Serum Creatinine: 0.68 mg/dL (Using min of 1 mg/dL) at 2/70/7974  5:54 AM Serum Sodium: 141 mmol/L (Using max of 137 mmol/L) at 05/19/2024  4:45 AM Total Bilirubin: 0.7 mg/dL (Using min of 1 mg/dL) at 2/70/7974  5:54 AM Serum Albumin : 3.1 g/dL at 2/70/7974  5:54 AM INR(ratio): 1 at 05/18/2024  5:10 PM Age at listing (hypothetical): 12 years Sex: Female at 05/19/2024  4:45 AM  Assessment:  1.  Transverse colon ca (Stage IIB) s/p resection 01/2023 s/p neoadjuvant chemo  2. Diarrhea s/p resection- negative diatherix, fecal cal 71, Colonoscopy Sianne Tejada 2025 unremarkable. 3.  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 Has low plts (d/t TTP, nl alb)  4. Thrombocytopenia  Questionable history of hepatitis C? hepatitis C quant not detected, HCV antibody with reflex reactive.  PT/INR normal. Abd u/s Background liver echogenicity at the upper limits of normal to mildly increased. No discrete liver lesion.  EGD 5/25 with mild portal hypertensive gastropathy. Repeat 2 years. LFT's normal. Total protein 5.4. PLTs 80  Plan: - Order liver US  elastography  -repeat EGD in 2 years for surveillance -Start psyllium husk 2 tsp po daily -  avoid dairy, fructose, caffeine   Thank you for the courtesy of this consult. Please call me with any questions or concerns.   Jack Mineau, FNP-C [facility] 06/02/2024, 12:57 PM  Cc: Erick Greig LABOR, NP

## 2024-06-02 NOTE — Patient Instructions (Addendum)
 Diarrhea Start psyllium husk 2 teaspoon po daily  Avoid food triggers  You have been scheduled for an abdominal ultrasound with elastography at [facility] (1st floor). Your appointment is scheduled for 06/05/24 at 10:00am. Please arrive 30 minutes prior to your scheduled appointment for registration purposes. Make certain not to have anything to eat or drink after midnight prior to your procedure. Should you need to reschedule your appointment, you may contact radiology at [phone].  Liver Elastography Various chronic liver diseases such as hepatitis B, C, and fatty liver disease can lead to tissue damage and subsequent scar tissue formation. As the scar tissue accumulates, the liver loses some of its elasticity and becomes stiffer. Liver elastography involves the use of a surface ultrasound probe that delivers a low frequency pulse or shear wave to a small volume of liver tissue under the rib cage. The transmission of the sound wave is completely painless. How Is a Liver Elastography Performed? The liver is located in the right upper abdomen under the rib cage. Patients are asked to lie flat on an examination table. A technician places the FibroScan probe between the ribs on the right side of the lower chest wall. A series of 10 painless pulses are then applied to the liver. The results are recorded on the equipment and an overall liver stiffness score is generated. This score is then interpreted by a qualified physician to predict the likelihood of advanced fibrosis or cirrhosis.  Patients are asked to wear loose clothing and should not consume any liquids or solids for a minimum of 4 hours before the test to increase the likelihood of obtaining reliable test results. The scan will take 10 to 15 minutes to complete, but patients should plan on being available for 30 minutes to allow time for preparation  _______________________________________________________  If your blood pressure at  your visit was 140/90 or greater, please contact your primary care physician to follow up on this.  _______________________________________________________  If you are age 67 or older, your body mass index should be between 23-30. Your Body mass index is 36.07 kg/m. If this is out of the aforementioned range listed, please consider follow up with your Primary Care Provider.  If you are age 60 or younger, your body mass index should be between 19-25. Your Body mass index is 36.07 kg/m. If this is out of the aformentioned range listed, please consider follow up with your Primary Care Provider.   ________________________________________________________  The [facility] providers would like to encourage you to use MYCHART to communicate with providers for non-urgent requests or questions.  Due to long hold times on the telephone, sending your provider a message by [facility] may be a faster and more efficient way to get a response.  Please allow 48 business hours for a response.  Please remember that this is for non-urgent requests.  _______________________________________________________  [facility] is using a team-based approach to care.  Your team is made up of your doctor and two to three APPS. Our APPS (Nurse Practitioners and Physician Assistants) work with your physician to ensure care continuity for you. They are fully qualified to address your health concerns and develop a treatment plan. They communicate directly with your gastroenterologist to care for you. Seeing the Advanced Practice Practitioners on your physician's team can help you by facilitating care more promptly, often allowing for earlier appointments, access to diagnostic testing, procedures, and other specialty referrals.   Thank you for trusting me with your gastrointestinal care. Deanna  May, FNP-C

## 2024-06-05 ENCOUNTER — Ambulatory Visit (HOSPITAL_COMMUNITY)
Admission: RE | Admit: 2024-06-05 | Discharge: 2024-06-05 | Disposition: A | Discharge status: Home / Self Care | Source: Ambulatory Visit | Attending: Gastroenterology | Admitting: Gastroenterology

## 2024-06-05 DIAGNOSIS — K7460 Unspecified cirrhosis of liver: Secondary | ICD-10-CM | POA: Insufficient documentation

## 2024-06-05 DIAGNOSIS — K709 Alcoholic liver disease, unspecified: Secondary | ICD-10-CM | POA: Diagnosis not present

## 2024-06-05 DIAGNOSIS — R932 Abnormal findings on diagnostic imaging of liver and biliary tract: Secondary | ICD-10-CM | POA: Diagnosis not present

## 2024-06-05 DIAGNOSIS — K8020 Calculus of gallbladder without cholecystitis without obstruction: Secondary | ICD-10-CM | POA: Diagnosis not present

## 2024-06-05 DIAGNOSIS — K759 Inflammatory liver disease, unspecified: Secondary | ICD-10-CM | POA: Diagnosis not present

## 2024-06-09 DIAGNOSIS — L97912 Non-pressure chronic ulcer of unspecified part of right lower leg with fat layer exposed: Secondary | ICD-10-CM | POA: Diagnosis not present

## 2024-06-09 DIAGNOSIS — S81811A Laceration without foreign body, right lower leg, initial encounter: Secondary | ICD-10-CM | POA: Diagnosis not present

## 2024-06-12 ENCOUNTER — Ambulatory Visit: Payer: Self-pay | Admitting: Gastroenterology

## 2024-06-12 DIAGNOSIS — E538 Deficiency of other specified B group vitamins: Secondary | ICD-10-CM | POA: Diagnosis not present

## 2024-06-12 DIAGNOSIS — R42 Dizziness and giddiness: Secondary | ICD-10-CM | POA: Diagnosis not present

## 2024-06-12 DIAGNOSIS — Z6834 Body mass index (BMI) 34.0-34.9, adult: Secondary | ICD-10-CM | POA: Diagnosis not present

## 2024-06-12 DIAGNOSIS — J323 Chronic sphenoidal sinusitis: Secondary | ICD-10-CM | POA: Diagnosis not present

## 2024-06-15 ENCOUNTER — Telehealth: Payer: Self-pay | Admitting: Gastroenterology

## 2024-06-15 NOTE — Telephone Encounter (Signed)
 Attempted to call patient on both numbers on file. No answer and unable to leave voice mail.

## 2024-06-15 NOTE — Telephone Encounter (Signed)
 Patient's husband, Dick, calling to cancel EEG. Patient has Covid, he will call back to reschedule.  Dick [phone]

## 2024-06-16 ENCOUNTER — Emergency Department (HOSPITAL_COMMUNITY)

## 2024-06-16 ENCOUNTER — Other Ambulatory Visit: Payer: Self-pay

## 2024-06-16 ENCOUNTER — Encounter (HOSPITAL_COMMUNITY): Payer: Self-pay | Admitting: Emergency Medicine

## 2024-06-16 ENCOUNTER — Inpatient Hospital Stay (HOSPITAL_COMMUNITY)
Admission: EM | Admit: 2024-06-16 | Discharge: 2024-06-19 | DRG: 177 | Disposition: A | Discharge status: Home / Self Care | Attending: Family Medicine | Admitting: Family Medicine

## 2024-06-16 DIAGNOSIS — I872 Venous insufficiency (chronic) (peripheral): Secondary | ICD-10-CM | POA: Diagnosis present

## 2024-06-16 DIAGNOSIS — R4789 Other speech disturbances: Secondary | ICD-10-CM | POA: Diagnosis present

## 2024-06-16 DIAGNOSIS — Z85038 Personal history of other malignant neoplasm of large intestine: Secondary | ICD-10-CM

## 2024-06-16 DIAGNOSIS — Z87891 Personal history of nicotine dependence: Secondary | ICD-10-CM

## 2024-06-16 DIAGNOSIS — R400 Somnolence: Principal | ICD-10-CM

## 2024-06-16 DIAGNOSIS — G5600 Carpal tunnel syndrome, unspecified upper limb: Secondary | ICD-10-CM | POA: Diagnosis present

## 2024-06-16 DIAGNOSIS — K529 Noninfective gastroenteritis and colitis, unspecified: Secondary | ICD-10-CM | POA: Diagnosis present

## 2024-06-16 DIAGNOSIS — R059 Cough, unspecified: Secondary | ICD-10-CM | POA: Diagnosis present

## 2024-06-16 DIAGNOSIS — E785 Hyperlipidemia, unspecified: Secondary | ICD-10-CM | POA: Diagnosis present

## 2024-06-16 DIAGNOSIS — Z96641 Presence of right artificial hip joint: Secondary | ICD-10-CM | POA: Diagnosis present

## 2024-06-16 DIAGNOSIS — G9340 Encephalopathy, unspecified: Secondary | ICD-10-CM | POA: Diagnosis not present

## 2024-06-16 DIAGNOSIS — Z8619 Personal history of other infectious and parasitic diseases: Secondary | ICD-10-CM | POA: Diagnosis not present

## 2024-06-16 DIAGNOSIS — C189 Malignant neoplasm of colon, unspecified: Secondary | ICD-10-CM | POA: Diagnosis present

## 2024-06-16 DIAGNOSIS — K7469 Other cirrhosis of liver: Secondary | ICD-10-CM | POA: Diagnosis not present

## 2024-06-16 DIAGNOSIS — I452 Bifascicular block: Secondary | ICD-10-CM | POA: Diagnosis present

## 2024-06-16 DIAGNOSIS — R531 Weakness: Secondary | ICD-10-CM | POA: Diagnosis not present

## 2024-06-16 DIAGNOSIS — U071 COVID-19: Secondary | ICD-10-CM | POA: Diagnosis not present

## 2024-06-16 DIAGNOSIS — K7460 Unspecified cirrhosis of liver: Secondary | ICD-10-CM | POA: Diagnosis present

## 2024-06-16 DIAGNOSIS — G9341 Metabolic encephalopathy: Secondary | ICD-10-CM | POA: Diagnosis present

## 2024-06-16 DIAGNOSIS — L03115 Cellulitis of right lower limb: Secondary | ICD-10-CM | POA: Diagnosis not present

## 2024-06-16 DIAGNOSIS — R262 Difficulty in walking, not elsewhere classified: Secondary | ICD-10-CM | POA: Diagnosis present

## 2024-06-16 DIAGNOSIS — Z85828 Personal history of other malignant neoplasm of skin: Secondary | ICD-10-CM

## 2024-06-16 DIAGNOSIS — Z79899 Other long term (current) drug therapy: Secondary | ICD-10-CM

## 2024-06-16 DIAGNOSIS — D696 Thrombocytopenia, unspecified: Secondary | ICD-10-CM | POA: Diagnosis not present

## 2024-06-16 DIAGNOSIS — E876 Hypokalemia: Secondary | ICD-10-CM | POA: Diagnosis not present

## 2024-06-16 DIAGNOSIS — T148XXA Other injury of unspecified body region, initial encounter: Secondary | ICD-10-CM | POA: Diagnosis not present

## 2024-06-16 DIAGNOSIS — K219 Gastro-esophageal reflux disease without esophagitis: Secondary | ICD-10-CM | POA: Diagnosis present

## 2024-06-16 DIAGNOSIS — Z882 Allergy status to sulfonamides status: Secondary | ICD-10-CM

## 2024-06-16 DIAGNOSIS — C184 Malignant neoplasm of transverse colon: Secondary | ICD-10-CM

## 2024-06-16 DIAGNOSIS — Z8249 Family history of ischemic heart disease and other diseases of the circulatory system: Secondary | ICD-10-CM

## 2024-06-16 DIAGNOSIS — M797 Fibromyalgia: Secondary | ICD-10-CM | POA: Diagnosis present

## 2024-06-16 DIAGNOSIS — G40109 Localization-related (focal) (partial) symptomatic epilepsy and epileptic syndromes with simple partial seizures, not intractable, without status epilepticus: Secondary | ICD-10-CM | POA: Diagnosis not present

## 2024-06-16 DIAGNOSIS — L089 Local infection of the skin and subcutaneous tissue, unspecified: Secondary | ICD-10-CM | POA: Diagnosis not present

## 2024-06-16 DIAGNOSIS — Z9049 Acquired absence of other specified parts of digestive tract: Secondary | ICD-10-CM

## 2024-06-16 DIAGNOSIS — R410 Disorientation, unspecified: Secondary | ICD-10-CM | POA: Diagnosis not present

## 2024-06-16 DIAGNOSIS — R9431 Abnormal electrocardiogram [ECG] [EKG]: Secondary | ICD-10-CM | POA: Diagnosis not present

## 2024-06-16 DIAGNOSIS — Z91048 Other nonmedicinal substance allergy status: Secondary | ICD-10-CM

## 2024-06-16 DIAGNOSIS — Z0389 Encounter for observation for other suspected diseases and conditions ruled out: Secondary | ICD-10-CM | POA: Diagnosis not present

## 2024-06-16 LAB — CBC WITH DIFFERENTIAL/PLATELET
Abs Immature Granulocytes: 0.02 K/uL (ref 0.00–0.07)
Basophils Absolute: 0 K/uL (ref 0.0–0.1)
Basophils Relative: 0 %
Eosinophils Absolute: 0 K/uL (ref 0.0–0.5)
Eosinophils Relative: 0 %
HCT: 36.1 % (ref 36.0–46.0)
Hemoglobin: 12.6 g/dL (ref 12.0–15.0)
Immature Granulocytes: 0 %
Lymphocytes Relative: 9 %
Lymphs Abs: 0.7 K/uL (ref 0.7–4.0)
MCH: 31.9 pg (ref 26.0–34.0)
MCHC: 34.9 g/dL (ref 30.0–36.0)
MCV: 91.4 fL (ref 80.0–100.0)
Monocytes Absolute: 0.6 K/uL (ref 0.1–1.0)
Monocytes Relative: 8 %
Neutro Abs: 6.1 K/uL (ref 1.7–7.7)
Neutrophils Relative %: 83 %
Platelets: 52 K/uL — ABNORMAL LOW (ref 150–400)
RBC: 3.95 MIL/uL (ref 3.87–5.11)
RDW: 12.3 % (ref 11.5–15.5)
WBC: 7.4 K/uL (ref 4.0–10.5)
nRBC: 0 % (ref 0.0–0.2)

## 2024-06-16 LAB — COMPREHENSIVE METABOLIC PANEL WITH GFR
ALT: 13 U/L (ref 0–44)
AST: 25 U/L (ref 15–41)
Albumin: 3.2 g/dL — ABNORMAL LOW (ref 3.5–5.0)
Alkaline Phosphatase: 42 U/L (ref 38–126)
Anion gap: 12 (ref 5–15)
BUN: 9 mg/dL (ref 8–23)
CO2: 26 mmol/L (ref 22–32)
Calcium: 8.6 mg/dL — ABNORMAL LOW (ref 8.9–10.3)
Chloride: 97 mmol/L — ABNORMAL LOW (ref 98–111)
Creatinine, Ser: 0.71 mg/dL (ref 0.44–1.00)
GFR, Estimated: >60 mL/min (ref >60)
Glucose, Bld: 87 mg/dL (ref 70–99)
Potassium: 3.2 mmol/L — ABNORMAL LOW (ref 3.5–5.1)
Sodium: 135 mmol/L (ref 135–145)
Total Bilirubin: 0.6 mg/dL (ref 0.0–1.2)
Total Protein: 5.6 g/dL — ABNORMAL LOW (ref 6.5–8.1)

## 2024-06-16 LAB — URINALYSIS, W/ REFLEX TO CULTURE (INFECTION SUSPECTED)
Bacteria, UA: NONE SEEN
Bilirubin Urine: NEGATIVE
Glucose, UA: NEGATIVE mg/dL
Hgb urine dipstick: NEGATIVE
Ketones, ur: 20 mg/dL — AB
Leukocytes,Ua: NEGATIVE
Nitrite: NEGATIVE
Protein, ur: NEGATIVE mg/dL
Specific Gravity, Urine: 1.015 (ref 1.005–1.030)
pH: 6 (ref 5.0–8.0)

## 2024-06-16 LAB — I-STAT CHEM 8, ED
BUN: 8 mg/dL (ref 8–23)
Calcium, Ion: 1.17 mmol/L (ref 1.15–1.40)
Chloride: 97 mmol/L — ABNORMAL LOW (ref 98–111)
Creatinine, Ser: 0.7 mg/dL (ref 0.44–1.00)
Glucose, Bld: 84 mg/dL (ref 70–99)
HCT: 36 % (ref 36.0–46.0)
Hemoglobin: 12.2 g/dL (ref 12.0–15.0)
Potassium: 2.8 mmol/L — ABNORMAL LOW (ref 3.5–5.1)
Sodium: 138 mmol/L (ref 135–145)
TCO2: 26 mmol/L (ref 22–32)

## 2024-06-16 LAB — I-STAT CG4 LACTIC ACID, ED: Lactic Acid, Venous: 1.1 mmol/L (ref 0.5–1.9)

## 2024-06-16 LAB — MAGNESIUM: Magnesium: 1.6 mg/dL — ABNORMAL LOW (ref 1.7–2.4)

## 2024-06-16 LAB — RESP PANEL BY RT-PCR (RSV, FLU A&B, COVID)  RVPGX2
Influenza A by PCR: NEGATIVE
Influenza B by PCR: NEGATIVE
Resp Syncytial Virus by PCR: NEGATIVE
SARS Coronavirus 2 by RT PCR: POSITIVE — AB

## 2024-06-16 LAB — RPR: RPR Ser Ql: NONREACTIVE

## 2024-06-16 LAB — VALPROIC ACID LEVEL: Valproic Acid Lvl: 83 ug/mL (ref 50–100)

## 2024-06-16 LAB — LACTATE DEHYDROGENASE: LDH: 135 U/L (ref 98–192)

## 2024-06-16 LAB — PROTIME-INR
INR: 1.1 (ref 0.8–1.2)
Prothrombin Time: 14.3 s (ref 11.4–15.2)

## 2024-06-16 LAB — VITAMIN B12: Vitamin B-12: 1198 pg/mL — ABNORMAL HIGH (ref 180–914)

## 2024-06-16 LAB — PROCALCITONIN: Procalcitonin: <0.1 ng/mL

## 2024-06-16 LAB — AMMONIA: Ammonia: <13 umol/L (ref 9–35)

## 2024-06-16 LAB — TSH: TSH: 1.144 u[IU]/mL (ref 0.350–4.500)

## 2024-06-16 MED ORDER — BUPROPION HCL ER (XL) 150 MG PO TB24
150.0000 mg | ORAL_TABLET | Freq: Every evening | ORAL | Status: DC
  Administered 2024-06-16 – 2024-06-18 (×3): 150 mg via ORAL
  Filled 2024-06-16 (×3): qty 1

## 2024-06-16 MED ORDER — ACETAMINOPHEN 325 MG PO TABS: 650.0000 mg | ORAL_TABLET | Freq: Four times a day (QID) | ORAL | Status: DC | PRN

## 2024-06-16 MED ORDER — DULOXETINE HCL 60 MG PO CPEP
60.0000 mg | ORAL_CAPSULE | Freq: Every day | ORAL | Status: DC
  Administered 2024-06-16 – 2024-06-18 (×3): 60 mg via ORAL
  Filled 2024-06-16 (×3): qty 1

## 2024-06-16 MED ORDER — DIVALPROEX SODIUM 250 MG PO DR TAB
250.0000 mg | DELAYED_RELEASE_TABLET | Freq: Three times a day (TID) | ORAL | Status: DC
  Administered 2024-06-16 – 2024-06-19 (×11): 250 mg via ORAL
  Filled 2024-06-16 (×13): qty 1

## 2024-06-16 MED ORDER — SODIUM CHLORIDE 0.9% FLUSH
3.0000 mL | Freq: Two times a day (BID) | INTRAVENOUS | Status: DC
  Administered 2024-06-16 – 2024-06-19 (×7): 3 mL via INTRAVENOUS

## 2024-06-16 MED ORDER — GUAIFENESIN 100 MG/5ML PO LIQD: 5.0000 mL | ORAL | Status: DC | PRN

## 2024-06-16 MED ORDER — ACETAMINOPHEN 650 MG RE SUPP: 650.0000 mg | Freq: Four times a day (QID) | RECTAL | Status: DC | PRN

## 2024-06-16 MED ORDER — DIVALPROEX SODIUM 500 MG PO DR TAB
500.0000 mg | DELAYED_RELEASE_TABLET | Freq: Two times a day (BID) | ORAL | Status: DC
  Administered 2024-06-16: 500 mg via ORAL
  Filled 2024-06-16: qty 2

## 2024-06-16 MED ORDER — ONDANSETRON HCL 4 MG/2ML IJ SOLN: 4.0000 mg | Freq: Four times a day (QID) | INTRAMUSCULAR | Status: DC | PRN

## 2024-06-16 MED ORDER — PANTOPRAZOLE SODIUM 40 MG PO TBEC
40.0000 mg | DELAYED_RELEASE_TABLET | Freq: Every day | ORAL | Status: DC
  Administered 2024-06-16 – 2024-06-19 (×4): 40 mg via ORAL
  Filled 2024-06-16 (×4): qty 1

## 2024-06-16 MED ORDER — FENTANYL CITRATE PF 50 MCG/ML IJ SOSY: 50.0000 ug | PREFILLED_SYRINGE | INTRAMUSCULAR | Status: DC | PRN

## 2024-06-16 MED ORDER — POTASSIUM CHLORIDE 20 MEQ PO PACK
40.0000 meq | PACK | Freq: Once | ORAL | Status: AC
  Administered 2024-06-16: 40 meq via ORAL
  Filled 2024-06-16: qty 2

## 2024-06-16 MED ORDER — CEFAZOLIN SODIUM-DEXTROSE 2-4 GM/100ML-% IV SOLN
2.0000 g | Freq: Once | INTRAVENOUS | Status: AC
  Administered 2024-06-16: 2 g via INTRAVENOUS
  Filled 2024-06-16: qty 100

## 2024-06-16 MED ORDER — SENNOSIDES-DOCUSATE SODIUM 8.6-50 MG PO TABS: 1.0000 | ORAL_TABLET | Freq: Every evening | ORAL | Status: DC | PRN

## 2024-06-16 MED ORDER — SODIUM CHLORIDE 0.9 % IV BOLUS
1000.0000 mL | Freq: Once | INTRAVENOUS | Status: AC
  Administered 2024-06-16: 1000 mL via INTRAVENOUS

## 2024-06-16 NOTE — ED Notes (Signed)
 Pt on bed pan attempting to get sample.

## 2024-06-16 NOTE — Care Management Obs Status (Signed)
 MEDICARE OBSERVATION STATUS NOTIFICATION   Patient Details  Name: RIN GORTON MRN: [id number] Date of Birth: [birth year]   Medicare Observation Status Notification Given:  Yes  Verbally reviewed observation notice with [name] telephonically at [phone].  Patients husband ask for a copy to be mailed   [name] 06/16/2024, 4:20 PM

## 2024-06-16 NOTE — Progress Notes (Addendum)
 PROGRESS NOTE   [name]  FMW:[id number]    DOB: Mar 11, 1956    DOA: 06/16/2024  PCP: Erick Greig LABOR, NP   I have briefly reviewed patients previous medical records in [facility].   Brief Hospital Course:  [name] is a 68 y.o. married female with medical history significant for colon cancer status post colectomy and chemotherapy, some AMS/memory deficits/brain fog for greater than 1 year since completion of cancer treatment, cirrhosis, episodes of speech arrest, and chronic thrombocytopenia who presents with cough, congestion, fever, confusion, and positive home COVID test.   Patient is accompanied by her husband who helps with the history. She has had a cough, congestion, and fevers for the past 3 days.  She has also been experiencing increased confusion which has been waxing and waning, generalized weakness, profound fatigue, and increased difficulty ambulating.  Her chronic diarrhea has also been worse than usual.  She took a COVID-19 test at home 8/25 which was positive.  Admitted for acute encephalopathy suspected due to COVID-19 infection.  Supportive care.   Assessment & Plan:   Acute on chronic encephalopathy  As per spouse's report, patient has memory deficits and brain fog for greater than a year since completion of cancer treatment.  However current mental status is worse than her baseline for the last 3 days PTA. No acute findings on head CT; ammonia <13.  Chest x-ray without active disease.  UA not indicative of UTI. Most likely d/t acute COVID infection, will check TSH, B12, and RPR, use delirium precautions  Delirium precautions.  Minimize sedatives and opioids.   COVID-19 infection. She is on day 3 or 4 of symptoms, denies SOB and has clear CXR  Continue isolation and supportive care   No steroids or COVID-19 specific meds.   Thrombocytopenia  Chronic thrombocytopenia with hx TTP and cirrhosis Platelets 50k on admission without bleeding, had been in  70-100k range for the past year     Check LDH and follow CBC daily.  No bleeding reported. Per he oncology office note from 8/11, thrombocytopenia may very well be related to her known liver disease.   Colon cancer  S/p transverse colectomy April 2024 Stage IIB, s/p colectomy and Xeloda  (stopped early d/t MAHA) Remains disease free per oncology note from 06/01/24  Scheduled for oncology follow-up every 4 months.   Cirrhosis  Appears compensated     Episodes of speech arrest  Followed by [facility] neurology, improved with Depakote   Continue Depakote .  Check Depakote  levels although 7/28 was normal.  Ammonia levels normal. Adjusted Depakote  dose as she was taking at home.  She was on 250 mg 4 times daily PTA.   Hypokalemia  Replace and follow.  Check magnesium .  There is no height or weight on file to calculate BMI.   DVT prophylaxis: SCDs Start: 06/16/24 0443     Code Status: Full Code:  Family Communication:  Disposition:  Status is: Observation The patient remains OBS appropriate and will d/c before 2 midnights.     Consultants:   None  Procedures:     Subjective:  Evaluated this morning while still in ED along with spouse at bedside.  History as noted above.  Per spouse, appears more agitated than when she came in.  No dyspnea or pain reported.  Mild dry cough.  Eating and drinking well per spouse's report.  Objective:   Vitals:   06/16/24 0615 06/16/24 0645 06/16/24 0800 06/16/24 0818  BP:    ROLLEN)  122/56  Pulse: 88 87 89 93  Resp: 19 (!) 21  (!) 23  Temp:    97.9 F (36.6 C)  TempSrc:    Oral  SpO2: 100% 100% 100% 98%    General exam: Middle-age female, moderately built and nourished, somewhat ill looking, lying comfortably propped up in bed without distress.  Oral mucosa borderline hydration. Respiratory system: Clear to auscultation. Respiratory effort normal.   Cardiovascular system: S1 & S2 heard, RRR. No JVD, murmurs, rubs, gallops or clicks. No  pedal edema. Telemetry personally reviewed: Sinus rhythm. Gastrointestinal system: Abdomen is nondistended, soft and nontender. No organomegaly or masses felt. Normal bowel sounds heard. Central nervous system: Alert but lethargic, oriented to person and partly to place, hospital. No focal neurological deficits. Extremities: Moves all extremities symmetrically.  Right leg with chronic findings, mild asymmetric swelling compared to left, skin changes with some faint redness and scab from prior wound.  Nontender.?  Minimally warm.  Per patient's spouse, unchanged leg findings for the last couple of months.  Details as in picture below from admission. Skin: No rashes, lesions or ulcers Psychiatry: Judgement and insight impaired. Mood & affect cannot be assessed.    Data Reviewed:   I have personally reviewed following labs and imaging studies   CBC: Recent Labs  Lab 06/16/24 0145 06/16/24 0222  WBC 7.4  --   NEUTROABS 6.1  --   HGB 12.6 12.2  HCT 36.1 36.0  MCV 91.4  --   PLT 52*  --     Basic Metabolic Panel: Recent Labs  Lab 06/16/24 0145 06/16/24 0222  NA 135 138  K 3.2* 2.8*  CL 97* 97*  CO2 26  --   GLUCOSE 87 84  BUN 9 8  CREATININE 0.71 0.70  CALCIUM 8.6*  --     Liver Function Tests: Recent Labs  Lab 06/16/24 0145  AST 25  ALT 13  ALKPHOS 42  BILITOT 0.6  PROT 5.6*  ALBUMIN  3.2*    CBG: No results for input(s): GLUCAP in the last 168 hours.  Microbiology Studies:   Recent Results (from the past 240 hours)  Resp panel by RT-PCR (RSV, Flu A&B, Covid) Anterior Nasal Swab     Status: Abnormal   Collection Time: 06/16/24  1:27 AM   Specimen: Anterior Nasal Swab  Result Value Ref Range Status   SARS Coronavirus 2 by RT PCR POSITIVE (A) NEGATIVE Final   Influenza A by PCR NEGATIVE NEGATIVE Final   Influenza B by PCR NEGATIVE NEGATIVE Final    Comment: (NOTE) The Xpert Xpress SARS-CoV-2/FLU/RSV plus assay is intended as an aid in the diagnosis of  influenza from Nasopharyngeal swab specimens and should not be used as a sole basis for treatment. Nasal washings and aspirates are unacceptable for Xpert Xpress SARS-CoV-2/FLU/RSV testing.  Fact Sheet for Patients: BloggerCourse.com  Fact Sheet for Healthcare Providers: SeriousBroker.it  This test is not yet approved or cleared by the [place]  FDA and has been authorized for detection and/or diagnosis of SARS-CoV-2 by FDA under an Emergency Use Authorization (EUA). This EUA will remain in effect (meaning this test can be used) for the duration of the COVID-19 declaration under Section 564(b)(1) of the Act, 21 U.S.C. section 360bbb-3(b)(1), unless the authorization is terminated or revoked.     Resp Syncytial Virus by PCR NEGATIVE NEGATIVE Final    Comment: (NOTE) Fact Sheet for Patients: BloggerCourse.com  Fact Sheet for Healthcare Providers: SeriousBroker.it  This test is not yet approved  or cleared by the [place]  FDA and has been authorized for detection and/or diagnosis of SARS-CoV-2 by FDA under an Emergency Use Authorization (EUA). This EUA will remain in effect (meaning this test can be used) for the duration of the COVID-19 declaration under Section 564(b)(1) of the Act, 21 U.S.C. section 360bbb-3(b)(1), unless the authorization is terminated or revoked.  Performed at [facility], [address], [place], [place] [address]     Radiology Studies:  CT Head Wo Contrast Result Date: 06/16/2024 CLINICAL DATA:  COVID positivity with worsening confusion, initial encounter EXAM: CT HEAD WITHOUT CONTRAST TECHNIQUE: Contiguous axial images were obtained from the base of the skull through the vertex without intravenous contrast. RADIATION DOSE REDUCTION: This exam was performed according to the departmental dose-optimization program which includes automated  exposure control, adjustment of the mA and/or kV according to patient size and/or use of iterative reconstruction technique. COMPARISON:  None Available. FINDINGS: Brain: No evidence of acute infarction, hemorrhage, hydrocephalus, extra-axial collection or mass lesion/mass effect. Vascular: No hyperdense vessel or unexpected calcification. Skull: Normal. Negative for fracture or focal lesion. Sinuses/Orbits: Mucosal thickening is noted in the paranasal sinuses. Other: None IMPRESSION: No acute intracranial abnormality noted. Mucosal thickening in the paranasal sinuses likely of a chronic nature. Electronically Signed   By: Oneil Devonshire M.D.   On: 06/16/2024 02:54   DG Chest Port 1 View Result Date: 06/16/2024 CLINICAL DATA:  Possible sepsis EXAM: PORTABLE CHEST 1 VIEW COMPARISON:  05/19/2024 FINDINGS: Cardiac shadow is within normal limits. Right chest wall port is noted in satisfactory position. No focal infiltrate or effusion is seen. No bony abnormality is noted. IMPRESSION: No active disease. Electronically Signed   By: Oneil Devonshire M.D.   On: 06/16/2024 01:45    Scheduled Meds:    buPROPion   150 mg Oral QPM   divalproex   500 mg Oral BID   DULoxetine   60 mg Oral QHS   pantoprazole   40 mg Oral Daily   sodium chloride  flush  3 mL Intravenous Q12H    Continuous Infusions:     LOS: 0 days     Trenda Mar, MD,  FACP, [facility], [facility], [facility]   [facility] [place]      To contact the attending provider between 7A-7P or the covering provider during after hours 7P-7A, please log into the web site [email or web address] and access using universal [place] password for that web site. If you do not have the password, please call the hospital operator.  06/16/2024, 9:14 AM

## 2024-06-16 NOTE — H&P (Signed)
 History and Physical    SHAWNELLE SPOERL FMW:[id number] DOB: [birth year] DOA: 06/16/2024  PCP: Erick Greig LABOR, NP   Patient coming from: Home   Chief Complaint: COVID+, cough, congestion, fever, confusion   HPI: [name] is a 68 y.o. female with medical history significant for colon cancer status post colectomy and chemotherapy, cirrhosis, episodes of speech arrest, and chronic thrombocytopenia who presents with cough, congestion, fever, confusion, and positive home COVID test.  Patient is accompanied by her husband who helps with the history. She has had a cough, congestion, and fevers for the past 3 days.  She has also been experiencing increased confusion which has been waxing and waning, generalized weakness, profound fatigue, and increased difficulty ambulating.  Her chronic diarrhea has also been worse than usual.  She took a COVID-19 test at home yesterday which was positive.  Patient denies headache, chest pain, or abdominal pain in the ED.  ED Course: Upon arrival to the ED, patient is found to be afebrile and saturating well on room air with normal HR and stable BP.  Labs are most notable for potassium 3.2, normal creatinine, normal WBC, platelets 52,000, and positive COVID-19 PCR.  There are no acute findings on head CT or chest x-ray.  Blood cultures were collected in the ED and the patient was treated with fentanyl , Zofran , 1 L NS, and Keflex .  Review of Systems:  ROS limited by patient's clinical condition.  Past Medical History:  Diagnosis Date   Abnormal glucose    Achilles tendinitis of left lower extremity    Acute suppurative arthritis due to bacteria (HCC) 03/09/2011   Overview:  Last Assessment & Plan:  Methicillin sensitive staphylococcal aureus septic hip. She clearly does need I&D of the hip. I will have her continue the doxycycline  for now but stop it 7 days prior to her surgery to maximize the yield on the cultures in the operating room though I be shocked if we  don't find methicillin sensitive staph aureus again.. I agree with removing as much of the pros   Anal fissure 11/30/2015   Arthritis    right hip   Atypical chest pain 04/09/2016   Bilateral edema of lower extremity    BMI 39.0-39.9,adult    Cancer [facility])    SKIN CANCER   Carpal tunnel syndrome 03/09/2011   Overview:  Last Assessment & Plan:  Clinically this seems to be carpal tunnel syndrome. Giving given her history of infection though we can keep in the back of our mind the idea that she will have a cervical spine problem but I think this is unlikely at this point in time medics carpal tunnel syndrome fits clinically this was a positive Tinel's sign however in for a brace for her.   Chronic vaginitis    Chronic venous insufficiency    Depression    Dyslipidemia    Fatigue 10/13/2015   Fibromyalgia    GERD without esophagitis    History of immune thrombocytopenia 03/09/2011   History of operative procedure on hip 03/10/2012   History of TTP (thrombotic thrombocytopenic purpura)    Hypokalemia    Lichen sclerosus et atrophicus of the vulva 11/30/2015   Major depressive disorder, recurrent, moderate (HCC)    Methicillin susceptible Staphylococcus aureus infection 03/09/2011   Overview:  Last Assessment & Plan:  This is undoubtedlythe culprit organism   Migraine    Morbid obesity with BMI of 45.0-49.9, adult (HCC) 10/13/2015   Morbidly obese (HCC)    MSSA (  methicillin susceptible Staphylococcus aureus) infection    Near syncope    Osteoarthritis, chronic    Pain due to total hip replacement (HCC) 09/20/2011   Palpitations 03/04/2019   Postmenopausal    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 syndrome (HCC)    20 yrs ago-not seen anyone for 10 yrs.   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 Mild diverticulosis. Small internal hemorrhoids. Healed anal fissure   ENDOVENOUS ABLATION SAPHENOUS VEIN W/ LASER Right 04/02/2019   endovenous laser ablation right greater saphenous vein by Lonni Blade MD    JOINT REPLACEMENT  08/2009   right hip replacement   REVISION TOTAL HIP ARTHROPLASTY     TONSILLECTOMY  1963   TOTAL HIP REVISION  10/08/2011   Procedure: TOTAL HIP REVISION;  Surgeon: Donnice JONETTA Car;  Location: [facility] ORS;  Service: Orthopedics;  Laterality: Right;  Resection of Right Total Hip/Extended Trochanteric Osteotomy/Placement of Antibiotic Total Hip Cemented by Depuy   TOTAL HIP REVISION  03/10/2012   Procedure: TOTAL HIP REVISION;  Surgeon: Donnice JONETTA Car, MD;  Location: [facility] ORS;  Service: Orthopedics;  Laterality: Right;  Reimplantation/Revision of a Right Total Hip and Removal of Cemented Implant    Social History:   reports that she quit smoking about 33 years ago. Her smoking use included cigarettes. She started smoking about 53 years ago. She has a 30 pack-year smoking history. She quit smokeless tobacco use about 33 years ago. She reports current alcohol use. She reports that she does not use drugs.  Allergies  Allergen Reactions   Bactrim [Sulfamethoxazole-Trimethoprim] Hives   Aloe Vera Other (See Comments)    Stuck onto skin per pt    Family History  Problem Relation Age of Onset   Hypertension Mother    Hypertension Father    Alzheimer's disease Father    Prostate cancer Father    Hypertension Maternal Grandfather    Seizures Other    Colon cancer Neg Hx    Stomach cancer Neg Hx    Rectal cancer Neg Hx    Esophageal cancer Neg Hx      Prior to Admission medications   Medication Sig Start Date End Date Taking? Authorizing Provider  buPROPion  (WELLBUTRIN  XL) 150 MG 24 hr tablet Take 150 mg by mouth every evening. 07/11/22   [provider]  divalproex  (DEPAKOTE ) 250 MG DR tablet Take 250 mg by mouth in the morning,  at noon, in the evening, and at bedtime. 01/19/23   [provider]  DULoxetine  (CYMBALTA ) 60 MG capsule Take 60 mg by mouth at bedtime.     [provider]  esomeprazole (NEXIUM) 40 MG capsule Take 40 mg by mouth daily. Pt states has been on for years 04/09/23   [provider]  Multiple Vitamin (MULTIVITAMIN WITH MINERALS) TABS tablet Take 1 tablet by mouth daily.    [provider]    Physical Exam: Vitals:   06/16/24 0130 06/16/24 0145 06/16/24 0445 06/16/24 0515  BP: 113/74 123/69    Pulse: 93 94  89  Resp: (!) 21 17 20  (!) 23  Temp:      TempSrc:      SpO2: 100% 100%  100%    Constitutional: NAD, no pallor or diaphoresis   Eyes: PERTLA, lids  and conjunctivae normal ENMT: Mucous membranes are moist. Posterior pharynx clear of any exudate or lesions.   Neck: supple, no masses  Respiratory: no wheezing, no crackles. No accessory muscle use.  Cardiovascular: S1 & S2 heard, regular rate and rhythm. No JVD.  Abdomen: No tenderness, soft. Bowel sounds active.  Musculoskeletal: no clubbing / cyanosis. No joint deformity upper and lower extremities.   Skin: Lower extremity hyperpigmentation in gaiter distribution, R>L. Warm, dry, well-perfused. Neurologic: CN 2-12 grossly intact. Sensation intact. Moving all extremities. Sleeping. Wakes to voice and is oriented to person and place.  Psychiatric: Pleasant. Cooperative.    Labs and Imaging on Admission: I have personally reviewed following labs and imaging studies  CBC: Recent Labs  Lab 06/16/24 0145 06/16/24 0222  WBC 7.4  --   NEUTROABS 6.1  --   HGB 12.6 12.2  HCT 36.1 36.0  MCV 91.4  --   PLT 52*  --    Basic Metabolic Panel: Recent Labs  Lab 06/16/24 0145 06/16/24 0222  NA 135 138  K 3.2* 2.8*  CL 97* 97*  CO2 26  --   GLUCOSE 87 84  BUN 9 8  CREATININE 0.71 0.70  CALCIUM 8.6*  --    GFR: Estimated Creatinine Clearance: 82 mL/min (by C-G formula based on SCr of 0.7  mg/dL). Liver Function Tests: Recent Labs  Lab 06/16/24 0145  AST 25  ALT 13  ALKPHOS 42  BILITOT 0.6  PROT 5.6*  ALBUMIN  3.2*   No results for input(s): LIPASE, AMYLASE in the last 168 hours. Recent Labs  Lab 06/16/24 0145  AMMONIA <13   Coagulation Profile: Recent Labs  Lab 06/16/24 0145  INR 1.1   Cardiac Enzymes: No results for input(s): CKTOTAL, CKMB, CKMBINDEX, TROPONINI in the last 168 hours. BNP (last 3 results) No results for input(s): PROBNP in the last 8760 hours. HbA1C: No results for input(s): HGBA1C in the last 72 hours. CBG: No results for input(s): GLUCAP in the last 168 hours. Lipid Profile: No results for input(s): CHOL, HDL, LDLCALC, TRIG, CHOLHDL, LDLDIRECT in the last 72 hours. Thyroid  Function Tests: No results for input(s): TSH, T4TOTAL, FREET4, T3FREE, THYROIDAB in the last 72 hours. Anemia Panel: No results for input(s): VITAMINB12, FOLATE, FERRITIN, TIBC, IRON , RETICCTPCT in the last 72 hours. Urine analysis:    Component Value Date/Time   COLORURINE YELLOW 06/16/2024 0402   APPEARANCEUR CLEAR 06/16/2024 0402   LABSPEC 1.015 06/16/2024 0402   PHURINE 6.0 06/16/2024 0402   GLUCOSEU NEGATIVE 06/16/2024 0402   HGBUR NEGATIVE 06/16/2024 0402   BILIRUBINUR NEGATIVE 06/16/2024 0402   KETONESUR 20 (A) 06/16/2024 0402   PROTEINUR NEGATIVE 06/16/2024 0402   UROBILINOGEN 0.2 03/04/2012 1315   NITRITE NEGATIVE 06/16/2024 0402   LEUKOCYTESUR NEGATIVE 06/16/2024 0402   Sepsis Labs: @LABRCNTIP (procalcitonin:4,lacticidven:4) ) Recent Results (from the past 240 hours)  Resp panel by RT-PCR (RSV, Flu A&B, Covid) Anterior Nasal Swab     Status: Abnormal   Collection Time: 06/16/24  1:27 AM   Specimen: Anterior Nasal Swab  Result Value Ref Range Status   SARS Coronavirus 2 by RT PCR POSITIVE (A) NEGATIVE Final   Influenza A by PCR NEGATIVE NEGATIVE Final   Influenza B by PCR NEGATIVE NEGATIVE  Final    Comment: (NOTE) The Xpert Xpress SARS-CoV-2/FLU/RSV plus assay is intended as an aid in the diagnosis of influenza from Nasopharyngeal swab specimens and should not be used as a sole basis for treatment. Nasal washings and aspirates are unacceptable  for Xpert Xpress SARS-CoV-2/FLU/RSV testing.  Fact Sheet for Patients: BloggerCourse.com  Fact Sheet for Healthcare Providers: SeriousBroker.it  This test is not yet approved or cleared by the [place]  FDA and has been authorized for detection and/or diagnosis of SARS-CoV-2 by FDA under an Emergency Use Authorization (EUA). This EUA will remain in effect (meaning this test can be used) for the duration of the COVID-19 declaration under Section 564(b)(1) of the Act, 21 U.S.C. section 360bbb-3(b)(1), unless the authorization is terminated or revoked.     Resp Syncytial Virus by PCR NEGATIVE NEGATIVE Final    Comment: (NOTE) Fact Sheet for Patients: BloggerCourse.com  Fact Sheet for Healthcare Providers: SeriousBroker.it  This test is not yet approved or cleared by the [place]  FDA and has been authorized for detection and/or diagnosis of SARS-CoV-2 by FDA under an Emergency Use Authorization (EUA). This EUA will remain in effect (meaning this test can be used) for the duration of the COVID-19 declaration under Section 564(b)(1) of the Act, 21 U.S.C. section 360bbb-3(b)(1), unless the authorization is terminated or revoked.  Performed at [facility], [address] [place], [place] [address]      Radiological Exams on Admission: CT Head Wo Contrast Result Date: 06/16/2024 CLINICAL DATA:  COVID positivity with worsening confusion, initial encounter EXAM: CT HEAD WITHOUT CONTRAST TECHNIQUE: Contiguous axial images were obtained from the base of the skull through the vertex without intravenous contrast.  RADIATION DOSE REDUCTION: This exam was performed according to the departmental dose-optimization program which includes automated exposure control, adjustment of the mA and/or kV according to patient size and/or use of iterative reconstruction technique. COMPARISON:  None Available. FINDINGS: Brain: No evidence of acute infarction, hemorrhage, hydrocephalus, extra-axial collection or mass lesion/mass effect. Vascular: No hyperdense vessel or unexpected calcification. Skull: Normal. Negative for fracture or focal lesion. Sinuses/Orbits: Mucosal thickening is noted in the paranasal sinuses. Other: None IMPRESSION: No acute intracranial abnormality noted. Mucosal thickening in the paranasal sinuses likely of a chronic nature. Electronically Signed   By: Oneil Devonshire M.D.   On: 06/16/2024 02:54   DG Chest Port 1 View Result Date: 06/16/2024 CLINICAL DATA:  Possible sepsis EXAM: PORTABLE CHEST 1 VIEW COMPARISON:  05/19/2024 FINDINGS: Cardiac shadow is within normal limits. Right chest wall port is noted in satisfactory position. No focal infiltrate or effusion is seen. No bony abnormality is noted. IMPRESSION: No active disease. Electronically Signed   By: Oneil Devonshire M.D.   On: 06/16/2024 01:45    EKG: Independently reviewed. Sinus rhythm, incomplete RBBB and LAFB.   Assessment/Plan   1. Acute encephalopathy  - No acute findings on head CT; ammonia <13  - Most likely d/t acute COVID infection, will check TSH, B12, and RPR, use delirium precautions    2. COVID-19  - She is on day 3 or 4 of symptoms, denies SOB and has clear CXR  - Continue isolation and supportive care    3. Thrombocytopenia  - Chronic thrombocytopenia with hx TTP and cirrhosis - Platelets 50k on admission without bleeding, had been in 70-100k range for the past year     - Check LDH, repeat CBC in am   4. Colon cancer  - Stage IIB, s/p colectomy and Xeloda  (stopped early d/t MAHA) - Remains disease free per oncology note from  06/01/24   5. Cirrhosis  - Appears compensated    6. Episodes of speech arrest  - Followed by [facility] neurology, improved with Depakote   - Continue Depakote   7. Hypokalemia  - Replacing     DVT prophylaxis: SCDs  Code Status: Full  Level of Care: Level of care: Telemetry Medical Family Communication: Husband at bedside  Disposition Plan:  Patient is from: home  Anticipated d/c is to: TBD Anticipated d/c date is: Possibly as early as 06/17/24  Patient currently: Pending improved mental status  Consults called: None  Admission status: Observation     Evalene GORMAN Sprinkles, MD [facility]  06/16/2024, 5:34 AM

## 2024-06-16 NOTE — ED Triage Notes (Addendum)
 Worsening confusion from home. COVID pos with home test. Symptoms for 2 days. Coughing. Hx of low platelets. Oriented x 2. Lives family. VSS. Tylenol  taken at 2030.

## 2024-06-16 NOTE — Plan of Care (Signed)
  Problem: Coping: Goal: Psychosocial and spiritual needs will be supported Outcome: Progressing   Problem: Respiratory: Goal: Will maintain a patent airway Outcome: Progressing   Problem: Education: Goal: Knowledge of General Education information will improve Description: Including pain rating scale, medication(s)/side effects and non-pharmacologic comfort measures Outcome: Progressing   

## 2024-06-16 NOTE — ED Notes (Signed)
 Pt resting comfortably; husband at bedside given a recliner.

## 2024-06-16 NOTE — ED Notes (Signed)
 Patient transported to CT

## 2024-06-16 NOTE — Progress Notes (Signed)
 Instructed and Demonstrated how to use Flutter valve with family member present. Patient demonstrated back, patient had weak attempt.

## 2024-06-16 NOTE — ED Provider Notes (Signed)
 [place] EMERGENCY DEPARTMENT AT [facility] Provider Note   CSN: [id number] Arrival date & time: 06/16/24  9951     Patient presents with: Weakness   [name] is a 68 y.o. female.   The history is provided by the patient and the spouse.  Weakness [name] is a 68 y.o. female who presents to the Emergency Department complaining of altered mental status. She presents to the emergency department accompanied by her husband for evaluation of altered mental status that has been progressive over the last several days. She has been sick for 2 to 3 days with cough, congestion and fevers to 103. Husband states that she has increased confusion and difficulty walking with generalized weakness. He states that it took her 45 minutes to get dressed today. No reported falls. At baseline she does have some confusion but this is far worse than baseline. She also reports diarrhea, she has chronic diarrhea but this is worsening. She has a history of colon cancer, thrombocytopenia. She did test positive for COVID on a home test today. No reported pain. Husband states that she has been complaining of headaches for several months. Patient denies any pain at time of ED assessment.     Prior to Admission medications   Medication Sig Start Date End Date Taking? Authorizing Provider  buPROPion  (WELLBUTRIN  XL) 150 MG 24 hr tablet Take 150 mg by mouth every evening. 07/11/22   [provider]  divalproex  (DEPAKOTE ) 250 MG DR tablet Take 250 mg by mouth in the morning, at noon, in the evening, and at bedtime. 01/19/23   [provider]  DULoxetine  (CYMBALTA ) 60 MG capsule Take 60 mg by mouth at bedtime.     [provider]  esomeprazole (NEXIUM) 40 MG capsule Take 40 mg by mouth daily. Pt states has been on for years 04/09/23   [provider]  Multiple Vitamin (MULTIVITAMIN WITH MINERALS) TABS tablet Take 1 tablet by mouth daily.    [provider]     Allergies: Bactrim [sulfamethoxazole-trimethoprim] and Aloe vera    Review of Systems  Neurological:  Positive for weakness.  All other systems reviewed and are negative.   Updated Vital Signs BP 123/69   Pulse 87   Temp 98.4 F (36.9 C) (Oral)   Resp (!) 21   LMP 04/05/2011   SpO2 100%   Physical Exam Vitals and nursing note reviewed.  Constitutional:      Appearance: She is well-developed.     Comments: Drowsy but awakens to verbal stimuli  HENT:     Head: Normocephalic and atraumatic.  Cardiovascular:     Rate and Rhythm: Normal rate and regular rhythm.     Heart sounds: No murmur heard. Pulmonary:     Effort: Pulmonary effort is normal. No respiratory distress.     Breath sounds: Normal breath sounds.  Abdominal:     Palpations: Abdomen is soft.     Tenderness: There is no abdominal tenderness. There is no guarding or rebound.  Musculoskeletal:     Comments: There is erythema to the right leg with mild to moderate local soft tissue swelling. This does not appear to be tender.  Skin:    General: Skin is warm and dry.  Neurological:     Comments: Oriented to hospital. Disoriented to time, recent events. Global weakness, greatest in the left lower extremity. Visual fields grossly intact.  Psychiatric:        Behavior: Behavior normal.     (  all labs ordered are listed, but only abnormal results are displayed) Labs Reviewed  RESP PANEL BY RT-PCR (RSV, FLU A&B, COVID)  RVPGX2 - Abnormal; Notable for the following components:      Result Value   SARS Coronavirus 2 by RT PCR POSITIVE (*)    All other components within normal limits  COMPREHENSIVE METABOLIC PANEL WITH GFR - Abnormal; Notable for the following components:   Potassium 3.2 (*)    Chloride 97 (*)    Calcium 8.6 (*)    Total Protein 5.6 (*)    Albumin  3.2 (*)    All other components within normal limits  CBC WITH DIFFERENTIAL/PLATELET - Abnormal; Notable for the following components:   Platelets  52 (*)    All other components within normal limits  URINALYSIS, W/ REFLEX TO CULTURE (INFECTION SUSPECTED) - Abnormal; Notable for the following components:   Ketones, ur 20 (*)    All other components within normal limits  I-STAT CHEM 8, ED - Abnormal; Notable for the following components:   Potassium 2.8 (*)    Chloride 97 (*)    All other components within normal limits  CULTURE, BLOOD (ROUTINE X 2)  CULTURE, BLOOD (ROUTINE X 2)  PROTIME-INR  AMMONIA  MAGNESIUM   LACTATE DEHYDROGENASE  PROCALCITONIN  TSH  VITAMIN B12  RPR  I-STAT CG4 LACTIC ACID, ED    EKG: EKG Interpretation Date/Time:  Tuesday June 16 2024 01:03:20 EDT Ventricular Rate:  95 PR Interval:  145 QRS Duration:  109 QT Interval:  360 QTC Calculation: 453 R Axis:   -57  Text Interpretation: Sinus rhythm Incomplete RBBB and LAFB Low voltage, precordial leads RSR' in V1 or V2, right VCD or RVH Consider anterior infarct Confirmed by Griselda Norris [phone]) on 06/16/2024 2:41:33 AM  Radiology: CT Head Wo Contrast Result Date: 06/16/2024 CLINICAL DATA:  COVID positivity with worsening confusion, initial encounter EXAM: CT HEAD WITHOUT CONTRAST TECHNIQUE: Contiguous axial images were obtained from the base of the skull through the vertex without intravenous contrast. RADIATION DOSE REDUCTION: This exam was performed according to the departmental dose-optimization program which includes automated exposure control, adjustment of the mA and/or kV according to patient size and/or use of iterative reconstruction technique. COMPARISON:  None Available. FINDINGS: Brain: No evidence of acute infarction, hemorrhage, hydrocephalus, extra-axial collection or mass lesion/mass effect. Vascular: No hyperdense vessel or unexpected calcification. Skull: Normal. Negative for fracture or focal lesion. Sinuses/Orbits: Mucosal thickening is noted in the paranasal sinuses. Other: None IMPRESSION: No acute intracranial abnormality noted.  Mucosal thickening in the paranasal sinuses likely of a chronic nature. Electronically Signed   By: Oneil Devonshire M.D.   On: 06/16/2024 02:54   DG Chest Port 1 View Result Date: 06/16/2024 CLINICAL DATA:  Possible sepsis EXAM: PORTABLE CHEST 1 VIEW COMPARISON:  05/19/2024 FINDINGS: Cardiac shadow is within normal limits. Right chest wall port is noted in satisfactory position. No focal infiltrate or effusion is seen. No bony abnormality is noted. IMPRESSION: No active disease. Electronically Signed   By: Oneil Devonshire M.D.   On: 06/16/2024 01:45     Procedures   Medications Ordered in the ED  buPROPion  (WELLBUTRIN  XL) 24 hr tablet 150 mg (has no administration in time range)  DULoxetine  (CYMBALTA ) DR capsule 60 mg (has no administration in time range)  pantoprazole  (PROTONIX ) EC tablet 40 mg (has no administration in time range)  divalproex  (DEPAKOTE ) DR tablet 500 mg (has no administration in time range)  sodium chloride  flush (NS) 0.9 %  injection 3 mL (has no administration in time range)  acetaminophen  (TYLENOL ) tablet 650 mg (has no administration in time range)    Or  acetaminophen  (TYLENOL ) suppository 650 mg (has no administration in time range)  senna-docusate (Senokot-S) tablet 1 tablet (has no administration in time range)  potassium chloride  (KLOR-CON ) packet 40 mEq (has no administration in time range)  guaiFENesin  (ROBITUSSIN) 100 MG/5ML liquid 5 mL (has no administration in time range)  sodium chloride  0.9 % bolus 1,000 mL (0 mLs Intravenous Stopped 06/16/24 0244)  ceFAZolin  (ANCEF ) IVPB 2g/100 mL premix (0 g Intravenous Stopped 06/16/24 0428)                                    Medical Decision Making Amount and/or Complexity of Data Reviewed Labs: ordered. Radiology: ordered.  Risk Prescription drug management. Decision regarding hospitalization.   Patient with history of cirrhosis, TTP, colon cancer here for evaluation of altered mental status for the last several  days, fevers at home. She does have erythema, warmth to the right lower extremity, she was started on antibiotics for possible cellulitis. She is positive for COVID-19. She is confused compared to her baseline. CBC with thrombocytopenia, slightly worse than baseline but no evidence of cute bleeding at this time. Plan to admit given her symptoms for ongoing care.     Final diagnoses:  Somnolence  COVID-19    ED Discharge Orders     None          Griselda Norris, MD 06/16/24 [phone]

## 2024-06-17 DIAGNOSIS — Z9049 Acquired absence of other specified parts of digestive tract: Secondary | ICD-10-CM | POA: Diagnosis not present

## 2024-06-17 DIAGNOSIS — G9340 Encephalopathy, unspecified: Secondary | ICD-10-CM | POA: Diagnosis not present

## 2024-06-17 DIAGNOSIS — U071 COVID-19: Secondary | ICD-10-CM | POA: Diagnosis not present

## 2024-06-17 DIAGNOSIS — Z8249 Family history of ischemic heart disease and other diseases of the circulatory system: Secondary | ICD-10-CM | POA: Diagnosis not present

## 2024-06-17 DIAGNOSIS — Z8619 Personal history of other infectious and parasitic diseases: Secondary | ICD-10-CM | POA: Diagnosis not present

## 2024-06-17 DIAGNOSIS — R262 Difficulty in walking, not elsewhere classified: Secondary | ICD-10-CM | POA: Diagnosis present

## 2024-06-17 DIAGNOSIS — E876 Hypokalemia: Secondary | ICD-10-CM | POA: Diagnosis not present

## 2024-06-17 DIAGNOSIS — R4789 Other speech disturbances: Secondary | ICD-10-CM | POA: Diagnosis not present

## 2024-06-17 DIAGNOSIS — E785 Hyperlipidemia, unspecified: Secondary | ICD-10-CM | POA: Diagnosis present

## 2024-06-17 DIAGNOSIS — C184 Malignant neoplasm of transverse colon: Secondary | ICD-10-CM | POA: Diagnosis not present

## 2024-06-17 DIAGNOSIS — L03115 Cellulitis of right lower limb: Secondary | ICD-10-CM | POA: Diagnosis present

## 2024-06-17 DIAGNOSIS — G9341 Metabolic encephalopathy: Secondary | ICD-10-CM | POA: Diagnosis present

## 2024-06-17 DIAGNOSIS — K529 Noninfective gastroenteritis and colitis, unspecified: Secondary | ICD-10-CM | POA: Diagnosis present

## 2024-06-17 DIAGNOSIS — L089 Local infection of the skin and subcutaneous tissue, unspecified: Secondary | ICD-10-CM | POA: Diagnosis not present

## 2024-06-17 DIAGNOSIS — M797 Fibromyalgia: Secondary | ICD-10-CM | POA: Diagnosis present

## 2024-06-17 DIAGNOSIS — T148XXA Other injury of unspecified body region, initial encounter: Secondary | ICD-10-CM | POA: Diagnosis not present

## 2024-06-17 DIAGNOSIS — R400 Somnolence: Secondary | ICD-10-CM | POA: Diagnosis not present

## 2024-06-17 DIAGNOSIS — R059 Cough, unspecified: Secondary | ICD-10-CM | POA: Diagnosis present

## 2024-06-17 DIAGNOSIS — D696 Thrombocytopenia, unspecified: Secondary | ICD-10-CM | POA: Diagnosis not present

## 2024-06-17 DIAGNOSIS — K219 Gastro-esophageal reflux disease without esophagitis: Secondary | ICD-10-CM | POA: Diagnosis present

## 2024-06-17 DIAGNOSIS — I872 Venous insufficiency (chronic) (peripheral): Secondary | ICD-10-CM | POA: Diagnosis present

## 2024-06-17 DIAGNOSIS — Z96641 Presence of right artificial hip joint: Secondary | ICD-10-CM | POA: Diagnosis present

## 2024-06-17 DIAGNOSIS — Z85828 Personal history of other malignant neoplasm of skin: Secondary | ICD-10-CM | POA: Diagnosis not present

## 2024-06-17 DIAGNOSIS — Z87891 Personal history of nicotine dependence: Secondary | ICD-10-CM | POA: Diagnosis not present

## 2024-06-17 DIAGNOSIS — G5600 Carpal tunnel syndrome, unspecified upper limb: Secondary | ICD-10-CM | POA: Diagnosis present

## 2024-06-17 DIAGNOSIS — Z85038 Personal history of other malignant neoplasm of large intestine: Secondary | ICD-10-CM | POA: Diagnosis not present

## 2024-06-17 DIAGNOSIS — K7469 Other cirrhosis of liver: Secondary | ICD-10-CM | POA: Diagnosis present

## 2024-06-17 DIAGNOSIS — Z882 Allergy status to sulfonamides status: Secondary | ICD-10-CM | POA: Diagnosis not present

## 2024-06-17 DIAGNOSIS — G40109 Localization-related (focal) (partial) symptomatic epilepsy and epileptic syndromes with simple partial seizures, not intractable, without status epilepticus: Secondary | ICD-10-CM | POA: Diagnosis present

## 2024-06-17 DIAGNOSIS — I452 Bifascicular block: Secondary | ICD-10-CM | POA: Diagnosis present

## 2024-06-17 LAB — CBC
HCT: 33.8 % — ABNORMAL LOW (ref 36.0–46.0)
Hemoglobin: 11.7 g/dL — ABNORMAL LOW (ref 12.0–15.0)
MCH: 31.4 pg (ref 26.0–34.0)
MCHC: 34.6 g/dL (ref 30.0–36.0)
MCV: 90.6 fL (ref 80.0–100.0)
Platelets: 42 K/uL — ABNORMAL LOW (ref 150–400)
RBC: 3.73 MIL/uL — ABNORMAL LOW (ref 3.87–5.11)
RDW: 12.4 % (ref 11.5–15.5)
WBC: 4.7 K/uL (ref 4.0–10.5)
nRBC: 0 % (ref 0.0–0.2)

## 2024-06-17 LAB — MRSA NEXT GEN BY PCR, NASAL: MRSA by PCR Next Gen: NOT DETECTED

## 2024-06-17 MED ORDER — POTASSIUM CHLORIDE 10 MEQ/100ML IV SOLN
10.0000 meq | INTRAVENOUS | Status: AC
  Administered 2024-06-17 (×3): 10 meq via INTRAVENOUS
  Filled 2024-06-17 (×2): qty 100

## 2024-06-17 MED ORDER — CEFAZOLIN SODIUM-DEXTROSE 1-4 GM/50ML-% IV SOLN
1.0000 g | Freq: Three times a day (TID) | INTRAVENOUS | Status: DC
  Administered 2024-06-17 – 2024-06-19 (×6): 1 g via INTRAVENOUS
  Filled 2024-06-17 (×7): qty 50

## 2024-06-17 MED ORDER — MAGNESIUM SULFATE 2 GM/50ML IV SOLN
2.0000 g | Freq: Once | INTRAVENOUS | Status: AC
  Administered 2024-06-17: 2 g via INTRAVENOUS
  Filled 2024-06-17: qty 50

## 2024-06-17 MED ORDER — POTASSIUM CHLORIDE 20 MEQ PO PACK
40.0000 meq | PACK | Freq: Once | ORAL | Status: AC
  Administered 2024-06-17: 40 meq via ORAL
  Filled 2024-06-17: qty 2

## 2024-06-17 NOTE — Evaluation (Signed)
 Physical Therapy Evaluation Patient Details Name: [name] MRN: [id number] DOB: [birth year] Today's Date: 06/17/2024  History of Present Illness  Pt is 68 year old presented to [facility]  [facility]/26 for covid and acute encephalopathy. PMH - colon CA, cirrhosis, rt THR with multiple revisions, depression, carpal tunnel  Clinical Impression  Pt presents to PT with slightly unsteady gait due to illness and inactivity. Expect pt will make good progress back to baseline with mobility. Will follow acutely but doubt pt will need PT after DC.          If plan is discharge home, recommend the following: Assistance with cooking/housework   Can travel by private vehicle        Equipment Recommendations None recommended by PT  Recommendations for Other Services       Functional Status Assessment Patient has had a recent decline in their functional status and demonstrates the ability to make significant improvements in function in a reasonable and predictable amount of time.     Precautions / Restrictions Precautions Precautions: Fall Restrictions Weight Bearing Restrictions Per Provider Order: No      Mobility  Bed Mobility Overal bed mobility: Needs Assistance Bed Mobility: Supine to Sit     Supine to sit: Min assist, HOB elevated     General bed mobility comments: Assist to initiate movement of legs. Assist to elevate trunk into sitting    Transfers Overall transfer level: Needs assistance Equipment used: Rolling walker (2 wheels) Transfers: Sit to/from Stand Sit to Stand: Contact guard assist           General transfer comment: Assist for safety    Ambulation/Gait Ambulation/Gait assistance: Contact guard assist Gait Distance (Feet): 30 Feet Assistive device: Rolling walker (2 wheels) Gait Pattern/deviations: Step-through pattern, Decreased stride length Gait velocity: decr Gait velocity interpretation: 1.31 - 2.62 ft/sec, indicative of limited community ambulator    General Gait Details: Assist for safety  Stairs            Wheelchair Mobility     Tilt Bed    Modified Rankin (Stroke Patients Only)       Balance Overall balance assessment: Needs assistance Sitting-balance support: No upper extremity supported, Feet supported Sitting balance-Leahy Scale: Good     Standing balance support: No upper extremity supported, During functional activity Standing balance-Leahy Scale: Fair                               Pertinent Vitals/Pain Pain Assessment Pain Assessment: No/denies pain    Home Living Family/patient expects to be discharged to:: Private residence Living Arrangements: Spouse/significant other Available Help at Discharge: Family;Available 24 hours/day Type of Home: House Home Access: Level entry       Home Layout: One level Home Equipment: Shower seat;Other (comment);Wheelchair - Forensic psychologist (2 wheels);Rollator (4 wheels);BSC/3in1 (hoyer lift) Additional Comments: Pt lives with husband, who is a Education officer, environmental, daughter who does not work, Pt takes care of her 65 y.o mother    Prior Function Prior Level of Function : Independent/Modified Independent             Mobility Comments: Does not typically need assistive device but has been weak over the past several days       Extremity/Trunk Assessment   Upper Extremity Assessment Upper Extremity Assessment: Generalized weakness    Lower Extremity Assessment Lower Extremity Assessment: Generalized weakness       Communication   Communication  Communication: No apparent difficulties    Cognition Arousal: Alert Behavior During Therapy: WFL for tasks assessed/performed   PT - Cognitive impairments: Memory, Problem solving, Initiation                       PT - Cognition Comments: Decr initiation and slow processing Following commands: Impaired Following commands impaired: Follows one step commands with increased time      Cueing Cueing Techniques: Verbal cues, Tactile cues     General Comments General comments (skin integrity, edema, etc.): VSS on RA. SpO2 98% on RA    Exercises     Assessment/Plan    PT Assessment Patient needs continued PT services  PT Problem List Decreased strength;Decreased balance;Decreased mobility;Decreased activity tolerance       PT Treatment Interventions DME instruction;Gait training;Functional mobility training;Therapeutic activities;Therapeutic exercise;Balance training;Patient/family education    PT Goals (Current goals can be found in the Care Plan section)  Acute Rehab PT Goals Patient Stated Goal: go home PT Goal Formulation: With patient Time For Goal Achievement: 07/01/24 Potential to Achieve Goals: Good    Frequency Min 2X/week     Co-evaluation               AM-PAC PT 6 Clicks Mobility  Outcome Measure Help needed turning from your back to your side while in a flat bed without using bedrails?: None Help needed moving from lying on your back to sitting on the side of a flat bed without using bedrails?: A Little Help needed moving to and from a bed to a chair (including a wheelchair)?: A Little Help needed standing up from a chair using your arms (e.g., wheelchair or bedside chair)?: A Little Help needed to walk in hospital room?: A Little Help needed climbing 3-5 steps with a railing? : A Little 6 Click Score: 19    End of Session   Activity Tolerance: Patient tolerated treatment well Patient left: in chair;with call bell/phone within reach;with chair alarm set Nurse Communication: Mobility status PT Visit Diagnosis: Other abnormalities of gait and mobility (R26.89);Unsteadiness on feet (R26.81);Muscle weakness (generalized) (M62.81)    Time: 8788-8764 PT Time Calculation (min) (ACUTE ONLY): 24 min   Charges:   PT Evaluation $PT Eval Low Complexity: 1 Low PT Treatments $Gait Training: 8-22 mins PT General Charges $$ ACUTE PT VISIT:  1 Visit         [facility] PT Acute Rehabilitation Services Office [phone]   [place]gers ORN [facility] 06/17/2024, 1:58 PM

## 2024-06-17 NOTE — Care Management Obs Status (Signed)
 MEDICARE OBSERVATION STATUS NOTIFICATION   Patient Details  Name: [name] MRN: [id number] Date of Birth: 1956-08-18   Medicare Observation Status Notification Given:  Yes  Verbally reviewed observation notice with [name] telephonically at [phone].  Patients husband ask for a copy to be mailed   [name] 06/17/2024, 8:40 AM

## 2024-06-17 NOTE — Progress Notes (Signed)
 Transition of Care [facility]) - Inpatient Brief Assessment   Patient Details  Name: [name] MRN: [id number] Date of Birth: 1956-02-11  Transition of Care [facility]) CM/SW Contact:    Rosaline JONELLE Joe, RN Phone Number: 06/17/2024, 3:54 PM   Clinical Narrative: Patient admitted for respiratory infection and positive for COVID.  Patient lives with spouse in the home.  Patient remains on RA.  No IP Care management needs at this time.   Transition of Care Asessment: Insurance and Status: (P) Insurance coverage has been reviewed Patient has primary care physician: (P) Yes Home environment has been reviewed: (P) from home with spouse Prior level of function:: (P) self Prior/Current Home Services: (P) No current home services Social Drivers of Health Review: (P) SDOH reviewed no interventions necessary Readmission risk has been reviewed: (P) Yes Transition of care needs: (P) no transition of care needs at this time

## 2024-06-17 NOTE — Progress Notes (Signed)
 [facility]  PROGRESS NOTE  [name] FMW:[id number] DOB: 02-Sep-1956 DOA: 06/16/2024 PCP: Erick Greig LABOR, NP   Brief HPI:    68 y.o. married female with medical history significant for colon cancer status post colectomy and chemotherapy, some AMS/memory deficits/brain fog for greater than 1 year since completion of cancer treatment, cirrhosis, episodes of speech arrest, and chronic thrombocytopenia who presents with cough, congestion, fever, confusion, and positive home COVID test.     Assessment/Plan:   Acute on chronic encephalopathy  -Unclear etiology, CT head was unremarkable -Ammonia level less than 13, chest x-ray unremarkable -UA was clear -RPR nonreactive, TSH 1.144 B12 1198 Delirium precautions.  Minimize sedatives and opioids. - Likely in setting of cellulitis as well as COVID-19 infection - Will monitor patient's progress as she is put on antibiotics as below  Right leg wound/cellulitis -Significant tenderness and erythema of right lower extremity -Patient states that she was followed by wound care as outpatient -Was scratched by puppy few weeks ago which caused a wound -Will start IV cefazolin    COVID-19 infection. She is on day 3 or 4 of symptoms, denies SOB and has clear CXR  Continue isolation and supportive care   No steroids or COVID-19 specific meds.   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  No bleeding reported. Per he oncology office note from 8/11, thrombocytopenia may very well be related to her known liver disease.   Colon cancer  S/p transverse colectomy April 2024 Stage IIB, s/p colectomy and Xeloda  (stopped early d/t MAHA) Remains disease free per oncology note from 06/01/24  Scheduled for oncology follow-up every 4 months.   Cirrhosis  Appears compensated     Episodes of speech arrest   Followed by [facility] neurology, improved with Depakote   Continue Depakote .  Check Depakote  levels although 7/28 was normal.  Ammonia levels normal. Adjusted Depakote  dose as she was taking at home.  She was on 250 mg 4 times daily PTA.   Hypokalemia  Replace and follow.  Hypomagnesemia -Magnesium  1.6 - Will replace magnesium       Medications     buPROPion   150 mg Oral QPM   divalproex   250 mg Oral TID WC & HS   DULoxetine   60 mg Oral QHS   pantoprazole   40 mg Oral Daily   sodium chloride  flush  3 mL Intravenous Q12H     Data Reviewed:   CBG:  No results for input(s): GLUCAP in the last 168 hours.  SpO2: 100 %    Vitals:   06/16/24 2049 06/17/24 0427 06/17/24 0900 06/17/24 1231  BP: (!) 104/52 123/60 (!) 109/54 (!) 108/54  Pulse: 94 82 70 80  Resp:  17    Temp: 99.5 F (37.5 C) 97.7 F (36.5 C) 97.8 F (36.6 C)   TempSrc: Oral  Oral   SpO2: 100% 100% 97% 100%      Data Reviewed:  Basic Metabolic Panel: Recent Labs  Lab 06/16/24 0145 06/16/24 0222 06/16/24 0451 06/17/24 0203  NA 135 138  --  136  K 3.2* 2.8*  --  2.6*  CL 97* 97*  --  100  CO2 26  --   --  25  GLUCOSE 87 84  --  85  BUN 9 8  --  6*  CREATININE 0.71 0.70  --  0.69  CALCIUM 8.6*  --   --  8.5*  MG  --   --  1.6*  --     CBC: Recent Labs  Lab 06/16/24 0145 06/16/24 0222 06/17/24 0203  WBC 7.4  --  4.7  NEUTROABS 6.1  --   --   HGB 12.6 12.2 11.7*  HCT 36.1 36.0 33.8*  MCV 91.4  --  90.6  PLT 52*  --  42*    LFT Recent Labs  Lab 06/16/24 0145  AST 25  ALT 13  ALKPHOS 42  BILITOT 0.6  PROT 5.6*  ALBUMIN  3.2*     Antibiotics: Anti-infectives (From admission, onward)    Start     Dose/Rate Route Frequency Ordered Stop   06/17/24 1400  ceFAZolin  (ANCEF ) IVPB 1 g/50 mL premix        1 g 100 mL/hr over 30 Minutes Intravenous Every 8 hours 06/17/24 1016     06/16/24 0300  ceFAZolin  (ANCEF ) IVPB 2g/100 mL premix        2 g 200 mL/hr over 30 Minutes  Intravenous  Once 06/16/24 0245 06/16/24 0428        DVT prophylaxis: SCDs  Code Status: Full code  Family Communication: Discussed with patient's husband at bedside   CONSULTS    Subjective   Patient seen and examined, mental status has improved.  Still pleasantly confused.   Objective    Physical Examination:  Alert, oriented to self and place only, pleasantly confused S1-S2, regular Lungs clear to auscultation bilaterally Abdomen is soft, nontender Extremities-right lower extremity is erythematous and warm to touch, tender to palpation.  Has dried scab noted at the anterior aspect of right lower leg   Status is: Inpatient:             [name]   [facility] If 7PM-7AM, please contact night-coverage at [email or web address], Office  [phone]   06/17/2024, 4:11 PM  LOS: 0 days

## 2024-06-17 NOTE — Plan of Care (Signed)
  Problem: Education: Goal: Knowledge of risk factors and measures for prevention of condition will improve Outcome: Progressing   Problem: Coping: Goal: Psychosocial and spiritual needs will be supported Outcome: Progressing   Problem: Respiratory: Goal: Will maintain a patent airway Outcome: Progressing Goal: Complications related to the disease process, condition or treatment will be avoided or minimized Outcome: Progressing   Problem: Education: Goal: Knowledge of General Education information will improve Description: Including pain rating scale, medication(s)/side effects and non-pharmacologic comfort measures Outcome: Progressing   Problem: Health Behavior/Discharge Planning: Goal: Ability to manage health-related needs will improve Outcome: Progressing   Problem: Clinical Measurements: Goal: Ability to maintain clinical measurements within normal limits will improve Outcome: Progressing Goal: Will remain free from infection Outcome: Progressing Goal: Diagnostic test results will improve Outcome: Progressing Goal: Respiratory complications will improve Outcome: Progressing Goal: Cardiovascular complication will be avoided Outcome: Progressing   Problem: Activity: Goal: Risk for activity intolerance will decrease Outcome: Progressing   Problem: Nutrition: Goal: Adequate nutrition will be maintained Outcome: Progressing   Problem: Coping: Goal: Level of anxiety will decrease Outcome: Progressing   

## 2024-06-18 DIAGNOSIS — U071 COVID-19: Secondary | ICD-10-CM | POA: Diagnosis not present

## 2024-06-18 DIAGNOSIS — C184 Malignant neoplasm of transverse colon: Secondary | ICD-10-CM | POA: Diagnosis not present

## 2024-06-18 DIAGNOSIS — R400 Somnolence: Secondary | ICD-10-CM | POA: Diagnosis not present

## 2024-06-18 DIAGNOSIS — G9340 Encephalopathy, unspecified: Secondary | ICD-10-CM | POA: Diagnosis not present

## 2024-06-18 LAB — COMPREHENSIVE METABOLIC PANEL WITH GFR
ALT: 13 U/L (ref 0–44)
AST: 23 U/L (ref 15–41)
Albumin: 3.2 g/dL — ABNORMAL LOW (ref 3.5–5.0)
Alkaline Phosphatase: 43 U/L (ref 38–126)
Anion gap: 10 (ref 5–15)
BUN: 9 mg/dL (ref 8–23)
CO2: 23 mmol/L (ref 22–32)
Calcium: 9.1 mg/dL (ref 8.9–10.3)
Chloride: 105 mmol/L (ref 98–111)
Creatinine, Ser: 0.64 mg/dL (ref 0.44–1.00)
GFR, Estimated: >60 mL/min (ref >60)
Glucose, Bld: 107 mg/dL — ABNORMAL HIGH (ref 70–99)
Potassium: 3.4 mmol/L — ABNORMAL LOW (ref 3.5–5.1)
Sodium: 138 mmol/L (ref 135–145)
Total Bilirubin: 0.6 mg/dL (ref 0.0–1.2)
Total Protein: 6.1 g/dL — ABNORMAL LOW (ref 6.5–8.1)

## 2024-06-18 LAB — CBC
HCT: 36.7 % (ref 36.0–46.0)
Hemoglobin: 13.1 g/dL (ref 12.0–15.0)
MCH: 31.5 pg (ref 26.0–34.0)
MCHC: 35.7 g/dL (ref 30.0–36.0)
MCV: 88.2 fL (ref 80.0–100.0)
Platelets: 53 K/uL — ABNORMAL LOW (ref 150–400)
RBC: 4.16 MIL/uL (ref 3.87–5.11)
RDW: 12.4 % (ref 11.5–15.5)
WBC: 4 K/uL (ref 4.0–10.5)
nRBC: 0 % (ref 0.0–0.2)

## 2024-06-18 MED ORDER — POTASSIUM CHLORIDE CRYS ER 20 MEQ PO TBCR
40.0000 meq | EXTENDED_RELEASE_TABLET | Freq: Once | ORAL | Status: AC
  Administered 2024-06-18: 40 meq via ORAL
  Filled 2024-06-18: qty 2

## 2024-06-18 NOTE — Plan of Care (Signed)

## 2024-06-18 NOTE — Progress Notes (Signed)
 [facility]  PROGRESS NOTE  [name] FMW:[id number] DOB: 1956-02-10 DOA: 06/16/2024 PCP: [name] LABOR, NP   Brief HPI:    68 y.o. married female with medical history significant for colon cancer status post colectomy and chemotherapy, some AMS/memory deficits/brain fog for greater than 1 year since completion of cancer treatment, cirrhosis, episodes of speech arrest, and chronic thrombocytopenia who presents with cough, congestion, fever, confusion, and positive home COVID test.     Assessment/Plan:   Acute on chronic encephalopathy  -Resolved -Improved after starting IV cefazolin  for right leg cellulitis -CT head was unremarkable -Ammonia level less than 13, chest x-ray unremarkable -UA was clear -RPR nonreactive, TSH 1.144 B12 1198 Delirium precautions.  Minimize sedatives and opioids. - Likely in setting of cellulitis as well as COVID-19 infection   Right leg wound/cellulitis -Significantly improved with IV cefazolin  -Significant tenderness and erythema of right lower extremity -Patient states that she was followed by wound care as outpatient -Was scratched by puppy few weeks ago which caused a wound    COVID-19 infection. She is on day 3 or 4 of symptoms, denies SOB and has clear CXR  Continue isolation and supportive care   No steroids or COVID-19 specific meds.   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  No bleeding reported. Per he oncology office note from 8/11, thrombocytopenia may very well be related to her known liver disease.   Colon cancer  S/p transverse colectomy April 2024 Stage IIB, s/p colectomy and Xeloda  (stopped early d/t MAHA) Remains disease free per oncology note from 06/01/24  Scheduled for oncology follow-up every 4 months.   Liver cirrhosis  Appears compensated     Episodes of  speech arrest  Followed by [facility] neurology, improved with Depakote   Continue Depakote .  Check Depakote  levels although 7/28 was normal.  Ammonia levels normal. Adjusted Depakote  dose as she was taking at home.  She was on 250 mg 4 times daily PTA.   Hypokalemia  Potassium improved, today potassium was 3.4 - Will give additional K-Dur 40 mEq p.o. x 1  Hypomagnesemia - Magnesium  1.6, was replaced      Medications     buPROPion   150 mg Oral QPM   divalproex   250 mg Oral TID WC & HS   DULoxetine   60 mg Oral QHS   pantoprazole   40 mg Oral Daily   potassium chloride   40 mEq Oral Once   sodium chloride  flush  3 mL Intravenous Q12H     Data Reviewed:   CBG:  No results for input(s): GLUCAP in the last 168 hours.  SpO2: 97 %    Vitals:   06/18/24 0748 06/18/24 0749 06/18/24 1100 06/18/24 1141  BP: (!) 171/102 (!) 154/67  105/68  Pulse: 80 73  82  Resp: 18   18  Temp: 97.7 F (36.5 C)   97.6 F (36.4 C)  TempSrc:      SpO2: 100%   97%  Weight:    96.1 kg  Height:   5' 6 (1.676 m)       Data Reviewed:  Basic Metabolic Panel: Recent Labs  Lab 06/16/24 0145 06/16/24 0222 06/16/24 0451 06/17/24 0203 06/18/24 1035  NA 135 138  --  136 138  K 3.2* 2.8*  --  2.6* 3.4*  CL 97* 97*  --  100 105  CO2 26  --   --  25 23  GLUCOSE 87 84  --  85 107*  BUN 9 8  --  6* 9  CREATININE 0.71 0.70  --  0.69 0.64  CALCIUM 8.6*  --   --  8.5* 9.1  MG  --   --  1.6*  --   --     CBC: Recent Labs  Lab 06/16/24 0145 06/16/24 0222 06/17/24 0203 06/18/24 1035  WBC 7.4  --  4.7 4.0  NEUTROABS 6.1  --   --   --   HGB 12.6 12.2 11.7* 13.1  HCT 36.1 36.0 33.8* 36.7  MCV 91.4  --  90.6 88.2  PLT 52*  --  42* 53*    LFT Recent Labs  Lab 06/16/24 0145 06/18/24 1035  AST 25 23  ALT 13 13  ALKPHOS 42 43  BILITOT 0.6 0.6  PROT 5.6* 6.1*  ALBUMIN  3.2* 3.2*     Antibiotics: Anti-infectives (From admission, onward)    Start     Dose/Rate Route Frequency  Ordered Stop   06/17/24 1400  ceFAZolin  (ANCEF ) IVPB 1 g/50 mL premix        1 g 100 mL/hr over 30 Minutes Intravenous Every 8 hours 06/17/24 1016     06/16/24 0300  ceFAZolin  (ANCEF ) IVPB 2g/100 mL premix        2 g 200 mL/hr over 30 Minutes Intravenous  Once 06/16/24 0245 06/16/24 0428        DVT prophylaxis: SCDs  Code Status: Full code  Family Communication: Discussed with patient's husband at bedside   CONSULTS    Subjective   Mental status has significantly improved.  Right leg pain and redness improved after starting IV cefazolin    Objective    Physical Examination:  General-appears in no acute distress Heart-S1-S2, regular, no murmur auscultated Lungs-clear to auscultation bilaterally, no wheezing or crackles auscultated Abdomen-soft, nontender, no organomegaly Extremities-mild erythema involving right leg                                                               Neuro-alert, oriented x3, no focal deficit noted  Status is: Inpatient:             [name]   [facility] If 7PM-7AM, please contact night-coverage at [email or web address], Office  [phone]   06/18/2024, 3:10 PM  LOS: 1 day

## 2024-06-18 NOTE — Progress Notes (Signed)
 Mobility Specialist: Progress Note   06/18/24 1400  Mobility  Activity Ambulated with assistance  Level of Assistance Standby assist, set-up cues, supervision of patient - no hands on  Assistive Device Front wheel walker  Distance Ambulated (ft) 500 ft  Activity Response Tolerated well  Mobility Referral Yes  Mobility visit 1 Mobility  Mobility Specialist Start Time (ACUTE ONLY) 0945  Mobility Specialist Stop Time (ACUTE ONLY) 1005  Mobility Specialist Time Calculation (min) (ACUTE ONLY) 20 min    Pt received on EOB, agreeable to mobility session. Husband present. CGA initially but pt progressed to SV. Fatigued by EOS but otherwise no complaints. Returned to room without fault. Left on EOB with all needs met, call bell in reach.   Ileana Lute Mobility Specialist Please contact via SecureChat or Rehab office at [phone]

## 2024-06-19 ENCOUNTER — Other Ambulatory Visit (HOSPITAL_COMMUNITY): Payer: Self-pay

## 2024-06-19 DIAGNOSIS — L089 Local infection of the skin and subcutaneous tissue, unspecified: Secondary | ICD-10-CM | POA: Diagnosis not present

## 2024-06-19 DIAGNOSIS — T148XXA Other injury of unspecified body region, initial encounter: Secondary | ICD-10-CM | POA: Diagnosis not present

## 2024-06-19 DIAGNOSIS — G9340 Encephalopathy, unspecified: Secondary | ICD-10-CM | POA: Diagnosis not present

## 2024-06-19 DIAGNOSIS — U071 COVID-19: Secondary | ICD-10-CM | POA: Diagnosis not present

## 2024-06-19 LAB — BASIC METABOLIC PANEL WITH GFR
Anion gap: 13 (ref 5–15)
BUN: 9 mg/dL (ref 8–23)
CO2: 22 mmol/L (ref 22–32)
Calcium: 9.2 mg/dL (ref 8.9–10.3)
Chloride: 104 mmol/L (ref 98–111)
Creatinine, Ser: 0.6 mg/dL (ref 0.44–1.00)
GFR, Estimated: >60 mL/min (ref >60)
Glucose, Bld: 99 mg/dL (ref 70–99)
Potassium: 3.2 mmol/L — ABNORMAL LOW (ref 3.5–5.1)
Sodium: 139 mmol/L (ref 135–145)

## 2024-06-19 MED ORDER — CEPHALEXIN 500 MG PO CAPS
500.0000 mg | ORAL_CAPSULE | Freq: Two times a day (BID) | ORAL | 0 refills | Status: AC
  Filled 2024-06-19: qty 6, 3d supply, fill #0

## 2024-06-19 MED ORDER — ALUM & MAG HYDROXIDE-SIMETH 200-200-20 MG/5ML PO SUSP: 15.0000 mL | Freq: Once | ORAL | Status: DC | PRN

## 2024-06-19 NOTE — Plan of Care (Signed)

## 2024-06-19 NOTE — Progress Notes (Signed)
 Physical Therapy Treatment Patient Details Name: [name] MRN: [id number] DOB: December 18, 1955 Today's Date: 06/19/2024   History of Present Illness Pt is 68 year old presented to [facility] on  8/26 for covid and acute encephalopathy. PMH - colon CA, cirrhosis, rt THR with multiple revisions, depression, carpal tunnel    PT Comments  Pt up in room on entry, agreeable to ambulation. Pt oriented to self, place and situation but thinks it is 2027 initially. Able to ambulate at a contact guard level for safety, with occasional reach for handrail in hallway for steadying but no overt LoB. Pt requires increased cuing to find her way back to her room. With return to room pt report feeling slightly dizzy, however VSS on RA. D/c plans remain appropriate at this time, recommend 24 hour supervision due to continued confusion. PT will continue to follow acutely.      If plan is discharge home, recommend the following: Assistance with cooking/housework   Can travel by private vehicle      Yes   Equipment Recommendations  None recommended by PT       Precautions / Restrictions Precautions Precautions: Fall Restrictions Weight Bearing Restrictions Per Provider Order: No     Mobility   Transfers                   General transfer comment: walking in room on entry    Ambulation/Gait Ambulation/Gait assistance: Contact guard assist Gait Distance (Feet): 550 Feet   Gait Pattern/deviations: Step-through pattern, Decreased stride length Gait velocity: decr Gait velocity interpretation: <1.8 ft/sec, indicate of risk for recurrent falls   General Gait Details: contact guard for safety, increased lateral sway with gait and occasionally reaching out for handrail in hallway, however no overt LoB       Balance Overall balance assessment: Needs assistance Sitting-balance support: No upper extremity supported, Feet supported Sitting balance-Leahy Scale: Good     Standing balance support: No  upper extremity supported, During functional activity Standing balance-Leahy Scale: Fair                              Hotel manager: No apparent difficulties  Cognition Arousal: Alert Behavior During Therapy: WFL for tasks assessed/performed   PT - Cognitive impairments: Memory, Problem solving, Initiation                       PT - Cognition Comments: disoriented to year, difficulty with wayfinding, passed her room twice before being able to find it by room number Following commands: Impaired Following commands impaired: Follows one step commands with increased time    Cueing Cueing Techniques: Verbal cues, Tactile cues     General Comments General comments (skin integrity, edema, etc.): pt wtih c/o of dizziness with return to room BP 118/59, HR 87 SpO2 on RA 100% O2      Pertinent Vitals/Pain  2/10 nondescript generalized pain      PT Goals (current goals can now be found in the care plan section) Acute Rehab PT Goals Patient Stated Goal: go home PT Goal Formulation: With patient Time For Goal Achievement: 07/01/24 Potential to Achieve Goals: Good Progress towards PT goals: Progressing toward goals    Frequency    Min 2X/week       AM-PAC PT 6 Clicks Mobility   Outcome Measure  Help needed turning from your back to your side while in a flat bed  without using bedrails?: None Help needed moving from lying on your back to sitting on the side of a flat bed without using bedrails?: A Little Help needed moving to and from a bed to a chair (including a wheelchair)?: A Little Help needed standing up from a chair using your arms (e.g., wheelchair or bedside chair)?: A Little Help needed to walk in hospital room?: A Little Help needed climbing 3-5 steps with a railing? : A Little 6 Click Score: 19    End of Session   Activity Tolerance: Patient tolerated treatment well Patient left: with call bell/phone within  reach;Other (comment) (sitting EoB eating breakfast) Nurse Communication: Mobility status PT Visit Diagnosis: Other abnormalities of gait and mobility (R26.89);Unsteadiness on feet (R26.81);Muscle weakness (generalized) (M62.81)     Time: 9150-9091 PT Time Calculation (min) (ACUTE ONLY): 19 min  Charges:    $Gait Training: 8-22 mins PT General Charges $$ ACUTE PT VISIT: 1 Visit                     Rodert Hinch B. Fleeta Lapidus PT, DPT Acute Rehabilitation Services Please use secure chat or  Call Office [phone]    Almarie KATHEE Fleeta [facility] 06/19/2024, 9:24 AM

## 2024-06-19 NOTE — Telephone Encounter (Signed)
-----   Message from Cathryne PARAS May sent at 06/15/2024  1:06 PM EDT -----  Karna, Make sure patient gets the message below. If she wants to discuss further I can give her a call. She will need hepatic panel, CMC, BMET, PT/INR, AFP in 6 mths with RUQ abd U/s  Deanna, NP  ----- Message ----- From: Charlanne Groom, MD Sent: 06/12/2024   5:16 PM EDT To: Cathryne PARAS May, NP  Lets hold off on Rezdiffra (only for F2/F3 without cirrhosis).  From the ultrasound she has early cirrhosis.  Her platelets are low as well. RG ----- Message ----- From: May, Deanna J, NP Sent: 06/08/2024   8:36 AM EDT To: Groom Charlanne, MD  Dr. Charlanne- Is this patient appropriate to start Rezdiffra? If so how do you monitor these patients longterm? Lbs, imaging etc?  Deanna, NP ----- Message ----- From: Interface, Rad Results In Sent: 06/08/2024   7:08 AM EDT To: Cathryne PARAS May, NP

## 2024-06-19 NOTE — Discharge Summary (Addendum)
 Physician Discharge Summary   Patient: [name] MRN: [id number] DOB: 03/28/1956  Admit date:     06/16/2024  Discharge date: 06/19/24  Discharge Physician: [name]   PCP: Erick Greig LABOR, NP   Recommendations at discharge:   Follow-up general surgery as outpatient for wound on right leg Follow-up PCP in 1 week  Discharge Diagnoses: Principal Problem:   Acute encephalopathy Active Problems:   Hypokalemia   Colon cancer (HCC)   Diffuse nodular cirrhosis of liver (HCC)   COVID-19 virus infection   Thrombocytopenia (HCC)   Episodes of speech arrest   Metabolic encephalopathy  Resolved Problems:   * No resolved hospital problems. *  Hospital Course: 68 y.o. married female with medical history significant for colon cancer status post colectomy and chemotherapy, some AMS/memory deficits/brain fog for greater than 1 year since completion of cancer treatment, cirrhosis, episodes of speech arrest, and chronic thrombocytopenia who presents with cough, congestion, fever, confusion, and positive home COVID test.    Assessment and Plan:  Acute on chronic encephalopathy  -Resolved -Improved after starting IV cefazolin  for right leg cellulitis -CT head was unremarkable -Ammonia level less than 13, chest x-ray unremarkable -UA was clear -RPR nonreactive, TSH 1.144 B12 1198 Delirium precautions.  Minimize sedatives and opioids. - Likely in setting of cellulitis as well as COVID-19 infection      Right leg wound/cellulitis -Significantly improved with IV cefazolin  -Significant tenderness and erythema of right lower extremity -Patient states that she was followed by wound care as outpatient -Was scratched by puppy few weeks ago which caused a wound - Patient follows general surgery as outpatient for wound care -Right leg cellulitis has significantly improved, will discharge on Keflex  500 mg p.o. twice daily for 3 more days     COVID-19 infection. No symptoms, denies SOB and  has clear CXR  No steroids or COVID-19 specific meds.   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  No bleeding reported. Per he oncology office note from 8/11, thrombocytopenia may very well be related to her known liver disease.   Colon cancer  S/p transverse colectomy April 2024 Stage IIB, s/p colectomy and Xeloda  (stopped early d/t MAHA) Remains disease free per oncology note from 06/01/24  Scheduled for oncology follow-up every 4 months.   Liver cirrhosis  Appears compensated     Episodes of speech arrest  -Resolved, likely in setting of infection due to above Followed by [facility] neurology, improved with Depakote   Continue Depakote .  Check Depakote  levels although 7/28 was normal.  Ammonia levels normal. Adjusted Depakote  dose as she was taking at home.  She was on 250 mg 4 times daily PTA.   Hypokalemia  Potassium improved, today potassium was 3.4 - She received  K-Dur 40 mEq p.o. x 1 on 06/18/2024   Hypomagnesemia - Magnesium  1.6, was replaced              Consultants:  Procedures performed:  Disposition: Home Diet recommendation:  Discharge Diet Orders (From admission, onward)     Start     Ordered   06/19/24 0000  Diet - low sodium heart healthy        06/19/24 1040           Regular diet DISCHARGE MEDICATION: Allergies as of 06/19/2024       Reactions  Bactrim [sulfamethoxazole-trimethoprim] Hives   Aloe Vera Other (See Comments)   Stuck onto skin per pt        Medication List     TAKE these medications    buPROPion  150 MG 24 hr tablet Commonly known as: WELLBUTRIN  XL Take 150 mg by mouth every evening.   cephALEXin  500 MG capsule Commonly known as: KEFLEX  Take 1 capsule (500 mg total) by mouth 2 (two) times daily for 3 days.   divalproex  500 MG DR tablet Commonly known as:  DEPAKOTE  Take 500 mg by mouth 2 (two) times daily.   DULoxetine  60 MG capsule Commonly known as: CYMBALTA  Take 60 mg by mouth at bedtime.   esomeprazole 40 MG capsule Commonly known as: NEXIUM Take 40 mg by mouth daily. Pt states has been on for years   multivitamin with minerals Tabs tablet Take 1 tablet by mouth daily.        Follow-up Information     [name], [name], NP Follow up in 1 week(s).   Specialty: Internal Medicine Contact information: [address] [place] [address] [phone]                Discharge Exam: Fredricka Weights   06/18/24 1141  Weight: 96.1 kg   General-appears in no acute distress Heart-S1-S2, regular, no murmur auscultated Lungs-clear to auscultation bilaterally, no wheezing or crackles auscultated Abdomen-soft, nontender, no organomegaly Extremities-no edema in the lower extremities Neuro-alert, oriented x3, no focal deficit noted  Condition at discharge: good  The results of significant diagnostics from this hospitalization (including imaging, microbiology, ancillary and laboratory) are listed below for reference.   Imaging Studies: CT Head Wo Contrast Result Date: 06/16/2024 CLINICAL DATA:  COVID positivity with worsening confusion, initial encounter EXAM: CT HEAD WITHOUT CONTRAST TECHNIQUE: Contiguous axial images were obtained from the base of the skull through the vertex without intravenous contrast. RADIATION DOSE REDUCTION: This exam was performed according to the departmental dose-optimization program which includes automated exposure control, adjustment of the mA and/or kV according to patient size and/or use of iterative reconstruction technique. COMPARISON:  None Available. FINDINGS: Brain: No evidence of acute infarction, hemorrhage, hydrocephalus, extra-axial collection or mass lesion/mass effect. Vascular: No hyperdense vessel or unexpected calcification. Skull: Normal. Negative for fracture or focal lesion.  Sinuses/Orbits: Mucosal thickening is noted in the paranasal sinuses. Other: None IMPRESSION: No acute intracranial abnormality noted. Mucosal thickening in the paranasal sinuses likely of a chronic nature. Electronically Signed   By: Oneil Devonshire M.D.   On: 06/16/2024 02:54   DG Chest Port 1 View Result Date: 06/16/2024 CLINICAL DATA:  Possible sepsis EXAM: PORTABLE CHEST 1 VIEW COMPARISON:  05/19/2024 FINDINGS: Cardiac shadow is within normal limits. Right chest wall port is noted in satisfactory position. No focal infiltrate or effusion is seen. No bony abnormality is noted. IMPRESSION: No active disease. Electronically Signed   By: Oneil Devonshire M.D.   On: 06/16/2024 01:45   US  ELASTOGRAPHY LIVER Result Date: 06/08/2024 CLINICAL DATA:  Cirrhosis EXAM: US  ELASTOGRAPHY HEPATIC TECHNIQUE: Sonography of the liver was performed. In addition, ultrasound elastography evaluation of the liver was performed. A region of interest was placed within the right lobe of the liver. Following application of a compressive sonographic pulse, tissue compressibility was assessed. Multiple assessments were performed at the selected site. Median tissue compressibility was determined. Previously, hepatic stiffness was assessed by shear wave velocity. Based on recently published Society of Radiologists in Ultrasound consensus article, reporting is now recommended  to be performed in the SI units of pressure (kiloPascals) representing hepatic stiffness/elasticity. The obtained result is compared to the published reference standards. (cACLD = compensated Advanced Chronic Liver Disease) COMPARISON:  None Available. FINDINGS: Liver: No focal lesion identified. Coarse contour of the liver. Increased parenchymal echogenicity. Portal vein is patent on color Doppler imaging with normal direction of blood flow towards the liver. Gallstones. ULTRASOUND HEPATIC ELASTOGRAPHY Device: Siemens Helix VTQ Patient position: Supine Transducer: [id number]  Number of measurements: 10 Hepatic segment:  8 Median kPa: 8.9 IQR: 1.2 IQR/Median kPa ratio: 0.13 Data quality:  Good Diagnostic category: < or = 9 kPa: in the absence of other known clinical signs, rules out cACLD The use of hepatic elastography is applicable to patients with viral hepatitis and non-alcoholic fatty liver disease. At this time, there is insufficient data for the referenced cut-off values and use in other causes of liver disease, including alcoholic liver disease. Patients, however, may be assessed by elastography and serve as their own reference standard/baseline. In patients with non-alcoholic liver disease, the values suggesting compensated advanced chronic liver disease (cACLD) may be lower, and patients may need additional testing with elasticity results of 7-9 kPa. Please note that abnormal hepatic elasticity and shear wave velocities may also be identified in clinical settings other than with hepatic fibrosis, such as: acute hepatitis, elevated right heart and central venous pressures including use of beta blockers, veno-occlusive disease (Budd-Chiari), infiltrative processes such as mastocytosis/amyloidosis/infiltrative tumor/lymphoma, extrahepatic cholestasis, with hyperemia in the post-prandial state, and with liver transplantation. Correlation with patient history, laboratory data, and clinical condition recommended. Diagnostic Categories: < or =5 kPa: high probability of being normal < or =9 kPa: in the absence of other known clinical signs, rules out cACLD >9 kPa and ?13 kPa: suggestive of cACLD, but needs further testing >13 kPa: highly suggestive of cACLD > or =17 kPa: highly suggestive of cACLD with an increased probability of clinically significant portal hypertension IMPRESSION: ULTRASOUND LIVER: 1. Coarse contour of the liver, morphologically suggesting cirrhosis. Hepatic steatosis. 2.  Cholelithiasis. ULTRASOUND HEPATIC ELASTOGRAPHY: Median kPa:  8.9 Diagnostic category: < or = 9  kPa: in the absence of other known clinical signs, rules out cACLD In the setting of elevated liver function tests, non-fasting state, or vascular congestion, the stage of liver fibrosis may be overestimated. In some patients with NAFLD, the cut-off values for cACLD may be lower (7-9 kPa). In causes other than viral hepatitis and NAFLD, the cut-off values are not well established. Electronically Signed   By: Marolyn JONETTA Jaksch M.D.   On: 06/08/2024 07:06   VAS US  LOWER EXTREMITY VENOUS (DVT) Result Date: 05/20/2024  Lower Venous DVT Study Patient Name:  TAMMYE KAHLER  Date of Exam:   05/20/2024 Medical Rec #: [id number]      Accession #:    [id number] Date of Birth: 1956-09-30       Patient Gender: F Patient Age:   33 years Exam Location:  [facility] Procedure:      VAS US  LOWER EXTREMITY VENOUS (DVT) Referring Phys: RENATO APPLEBAUM --------------------------------------------------------------------------------  Indications: Chronic swelling.  Risk Factors: History of RLE venous insufficiency. RLE GSV ablation 04/02/2019. Limitations: Poor ultrasound/tissue interface. Comparison Study: BLE (reflux) on 05/14/2018 was negative for DVT Performing Technologist: Ezzie Potters RVT, RDMS  Examination Guidelines: A complete evaluation includes B-mode imaging, spectral Doppler, color Doppler, and power Doppler as needed of all accessible portions of each vessel. Bilateral testing is considered an integral part of a complete examination. Limited  examinations for reoccurring indications may be performed as noted. The reflux portion of the exam is performed with the patient in reverse Trendelenburg.  +---------+---------------+---------+-----------+----------+--------------+ RIGHT    CompressibilityPhasicitySpontaneityPropertiesThrombus Aging +---------+---------------+---------+-----------+----------+--------------+ CFV      Full           Yes      Yes                                  +---------+---------------+---------+-----------+----------+--------------+ SFJ      Full                                                        +---------+---------------+---------+-----------+----------+--------------+ FV Prox  Full           Yes      Yes                                 +---------+---------------+---------+-----------+----------+--------------+ FV Mid   Full           Yes      Yes                                 +---------+---------------+---------+-----------+----------+--------------+ FV DistalFull           Yes      Yes                                 +---------+---------------+---------+-----------+----------+--------------+ PFV      Full                                                        +---------+---------------+---------+-----------+----------+--------------+ POP      Full           Yes      Yes                                 +---------+---------------+---------+-----------+----------+--------------+ PTV      Full                                                        +---------+---------------+---------+-----------+----------+--------------+ PERO     Full                                                        +---------+---------------+---------+-----------+----------+--------------+   +---------+---------------+---------+-----------+----------+--------------+ LEFT     CompressibilityPhasicitySpontaneityPropertiesThrombus Aging +---------+---------------+---------+-----------+----------+--------------+ CFV      Full           Yes      Yes                                 +---------+---------------+---------+-----------+----------+--------------+  SFJ      Full                                                        +---------+---------------+---------+-----------+----------+--------------+ FV Prox  Full           Yes      Yes                                  +---------+---------------+---------+-----------+----------+--------------+ FV Mid   Full           Yes      Yes                                 +---------+---------------+---------+-----------+----------+--------------+ FV DistalFull           Yes      Yes                                 +---------+---------------+---------+-----------+----------+--------------+ PFV      Full                                                        +---------+---------------+---------+-----------+----------+--------------+ POP      Full           Yes      Yes                                 +---------+---------------+---------+-----------+----------+--------------+ PTV      Full                                                        +---------+---------------+---------+-----------+----------+--------------+ PERO     Full                                                        +---------+---------------+---------+-----------+----------+--------------+     Summary: BILATERAL: - No evidence of deep vein thrombosis seen in the lower extremities, bilaterally. -No evidence of popliteal cyst, bilaterally.   *See table(s) above for measurements and observations. Electronically signed by Debby Robertson on 05/20/2024 at 9:14:45 PM.    Final     Microbiology: Results for orders placed or performed during the hospital encounter of 06/16/24  Blood Culture (routine x 2)     Status: None (Preliminary result)   Collection Time: 06/16/24  1:26 AM   Specimen: BLOOD  Result Value Ref Range Status   Specimen Description BLOOD RIGHT ANTECUBITAL  Final   Special Requests   Final    BOTTLES DRAWN AEROBIC AND ANAEROBIC Blood Culture adequate volume   Culture  Final    NO GROWTH 3 DAYS Performed at [facility], [address], [place], [place] [address]    Report Status PENDING  Incomplete  Resp panel by RT-PCR (RSV, Flu A&B, Covid) Anterior Nasal Swab     Status: Abnormal   Collection Time:  06/16/24  1:27 AM   Specimen: Anterior Nasal Swab  Result Value Ref Range Status   SARS Coronavirus 2 by RT PCR POSITIVE (A) NEGATIVE Final   Influenza A by PCR NEGATIVE NEGATIVE Final   Influenza B by PCR NEGATIVE NEGATIVE Final    Comment: (NOTE) The Xpert Xpress SARS-CoV-2/FLU/RSV plus assay is intended as an aid in the diagnosis of influenza from Nasopharyngeal swab specimens and should not be used as a sole basis for treatment. Nasal washings and aspirates are unacceptable for Xpert Xpress SARS-CoV-2/FLU/RSV testing.  Fact Sheet for Patients: BloggerCourse.com  Fact Sheet for Healthcare Providers: SeriousBroker.it  This test is not yet approved or cleared by the [place]  FDA and has been authorized for detection and/or diagnosis of SARS-CoV-2 by FDA under an Emergency Use Authorization (EUA). This EUA will remain in effect (meaning this test can be used) for the duration of the COVID-19 declaration under Section 564(b)(1) of the Act, 21 U.S.C. section 360bbb-3(b)(1), unless the authorization is terminated or revoked.     Resp Syncytial Virus by PCR NEGATIVE NEGATIVE Final    Comment: (NOTE) Fact Sheet for Patients: BloggerCourse.com  Fact Sheet for Healthcare Providers: SeriousBroker.it  This test is not yet approved or cleared by the [place]  FDA and has been authorized for detection and/or diagnosis of SARS-CoV-2 by FDA under an Emergency Use Authorization (EUA). This EUA will remain in effect (meaning this test can be used) for the duration of the COVID-19 declaration under Section 564(b)(1) of the Act, 21 U.S.C. section 360bbb-3(b)(1), unless the authorization is terminated or revoked.  Performed at [facility], [address], [place], [place] [address]   Blood Culture (routine x 2)     Status: None (Preliminary result)   Collection Time:  06/16/24  1:31 AM   Specimen: BLOOD  Result Value Ref Range Status   Specimen Description BLOOD LEFT ANTECUBITAL  Final   Special Requests   Final    BOTTLES DRAWN AEROBIC AND ANAEROBIC Blood Culture adequate volume   Culture   Final    NO GROWTH 3 DAYS Performed at [facility], [address], [place], [place] [address]    Report Status PENDING  Incomplete  MRSA Next Gen by PCR, Nasal     Status: None   Collection Time: 06/17/24 10:20 AM   Specimen: Nasal Mucosa; Nasal Swab  Result Value Ref Range Status   MRSA by PCR Next Gen NOT DETECTED NOT DETECTED Final    Comment: (NOTE) The GeneXpert MRSA Assay (FDA approved for NASAL specimens only), is one component of a comprehensive MRSA colonization surveillance program. It is not intended to diagnose MRSA infection nor to guide or monitor treatment for MRSA infections. Test performance is not FDA approved in patients less than 16 years old. Performed at [facility], [address], [place], [place] [address]     Labs: CBC: Recent Labs  Lab 06/16/24 0145 06/16/24 0222 06/17/24 0203 06/18/24 1035  WBC 7.4  --  4.7 4.0  NEUTROABS 6.1  --   --   --   HGB 12.6 12.2 11.7* 13.1  HCT 36.1 36.0 33.8* 36.7  MCV 91.4  --  90.6 88.2  PLT 52*  --  42* 53*   Basic Metabolic Panel: Recent Labs  Lab 06/16/24 0145 06/16/24 0222 06/16/24 0451 06/17/24 0203 06/18/24 1035  NA 135 138  --  136 138  K 3.2* 2.8*  --  2.6* 3.4*  CL 97* 97*  --  100 105  CO2 26  --   --  25 23  GLUCOSE 87 84  --  85 107*  BUN 9 8  --  6* 9  CREATININE 0.71 0.70  --  0.69 0.64  CALCIUM 8.6*  --   --  8.5* 9.1  MG  --   --  1.6*  --   --    Liver Function Tests: Recent Labs  Lab 06/16/24 0145 06/18/24 1035  AST 25 23  ALT 13 13  ALKPHOS 42 43  BILITOT 0.6 0.6  PROT 5.6* 6.1*  ALBUMIN  3.2* 3.2*   CBG: No results for input(s): GLUCAP in the last 168 hours.  Discharge time spent: greater than 30 minutes.  Signed: [name], MD [facility] 06/19/2024

## 2024-06-19 NOTE — Discharge Instructions (Signed)
 Follow up surgery as outpatient for right leg wound

## 2024-06-21 LAB — CULTURE, BLOOD (ROUTINE X 2)
Culture: NO GROWTH
Culture: NO GROWTH
Special Requests: ADEQUATE
Special Requests: ADEQUATE

## 2024-06-22 LAB — BASIC METABOLIC PANEL WITH GFR
Anion gap: 11 (ref 5–15)
BUN: 6 mg/dL — ABNORMAL LOW (ref 8–23)
CO2: 25 mmol/L (ref 22–32)
Calcium: 8.5 mg/dL — ABNORMAL LOW (ref 8.9–10.3)
Chloride: 100 mmol/L (ref 98–111)
Creatinine, Ser: 0.69 mg/dL (ref 0.44–1.00)
GFR, Estimated: >60 mL/min (ref >60)
Glucose, Bld: 85 mg/dL (ref 70–99)
Potassium: 2.6 mmol/L — CL (ref 3.5–5.1)
Sodium: 136 mmol/L (ref 135–145)

## 2024-06-25 DIAGNOSIS — J324 Chronic pansinusitis: Secondary | ICD-10-CM | POA: Diagnosis not present

## 2024-06-25 NOTE — Telephone Encounter (Signed)
 Patient's husband returned call & apologized for delay response d/t patient being in the hospital frequently and having some confusion. He asked that going forward his number be placed as main contact. We discussed liver US  results & recommendations. He had no further questions.

## 2024-07-01 DIAGNOSIS — R4789 Other speech disturbances: Secondary | ICD-10-CM | POA: Diagnosis not present

## 2024-07-01 DIAGNOSIS — F0280 Dementia in other diseases classified elsewhere without behavioral disturbance: Secondary | ICD-10-CM | POA: Diagnosis not present

## 2024-07-01 DIAGNOSIS — G309 Alzheimer's disease, unspecified: Secondary | ICD-10-CM | POA: Diagnosis not present

## 2024-07-01 DIAGNOSIS — D696 Thrombocytopenia, unspecified: Secondary | ICD-10-CM | POA: Diagnosis not present

## 2024-07-01 DIAGNOSIS — Q2112 Patent foramen ovale: Secondary | ICD-10-CM | POA: Diagnosis not present

## 2024-07-01 DIAGNOSIS — G43009 Migraine without aura, not intractable, without status migrainosus: Secondary | ICD-10-CM | POA: Diagnosis not present

## 2024-07-07 DIAGNOSIS — D696 Thrombocytopenia, unspecified: Secondary | ICD-10-CM | POA: Diagnosis not present

## 2024-07-07 DIAGNOSIS — R42 Dizziness and giddiness: Secondary | ICD-10-CM | POA: Diagnosis not present

## 2024-07-07 DIAGNOSIS — U071 COVID-19: Secondary | ICD-10-CM | POA: Diagnosis not present

## 2024-07-07 DIAGNOSIS — J324 Chronic pansinusitis: Secondary | ICD-10-CM | POA: Diagnosis not present

## 2024-07-07 DIAGNOSIS — Z79899 Other long term (current) drug therapy: Secondary | ICD-10-CM | POA: Diagnosis not present

## 2024-07-07 DIAGNOSIS — L03115 Cellulitis of right lower limb: Secondary | ICD-10-CM | POA: Diagnosis not present

## 2024-07-07 DIAGNOSIS — Z6834 Body mass index (BMI) 34.0-34.9, adult: Secondary | ICD-10-CM | POA: Diagnosis not present

## 2024-07-07 DIAGNOSIS — R413 Other amnesia: Secondary | ICD-10-CM | POA: Diagnosis not present

## 2024-07-07 DIAGNOSIS — E876 Hypokalemia: Secondary | ICD-10-CM | POA: Diagnosis not present

## 2024-07-07 DIAGNOSIS — J323 Chronic sphenoidal sinusitis: Secondary | ICD-10-CM | POA: Diagnosis not present

## 2024-07-23 DIAGNOSIS — R059 Cough, unspecified: Secondary | ICD-10-CM | POA: Diagnosis not present

## 2024-07-23 DIAGNOSIS — J069 Acute upper respiratory infection, unspecified: Secondary | ICD-10-CM | POA: Diagnosis not present

## 2024-08-05 ENCOUNTER — Encounter: Payer: Self-pay | Admitting: Oncology

## 2024-08-05 ENCOUNTER — Other Ambulatory Visit (HOSPITAL_BASED_OUTPATIENT_CLINIC_OR_DEPARTMENT_OTHER): Payer: Self-pay

## 2024-08-05 DIAGNOSIS — Z13820 Encounter for screening for osteoporosis: Secondary | ICD-10-CM

## 2024-08-05 DIAGNOSIS — Z1231 Encounter for screening mammogram for malignant neoplasm of breast: Secondary | ICD-10-CM

## 2024-08-12 ENCOUNTER — Ambulatory Visit (INDEPENDENT_AMBULATORY_CARE_PROVIDER_SITE_OTHER)
Admission: RE | Admit: 2024-08-12 | Discharge: 2024-08-12 | Disposition: A | Discharge status: Home / Self Care | Source: Ambulatory Visit

## 2024-08-12 DIAGNOSIS — Z1231 Encounter for screening mammogram for malignant neoplasm of breast: Secondary | ICD-10-CM

## 2024-08-28 ENCOUNTER — Inpatient Hospital Stay (HOSPITAL_BASED_OUTPATIENT_CLINIC_OR_DEPARTMENT_OTHER): Admission: RE | Admit: 2024-08-28 | Source: Ambulatory Visit | Admitting: Radiology

## 2024-08-28 DIAGNOSIS — T148XXA Other injury of unspecified body region, initial encounter: Secondary | ICD-10-CM | POA: Diagnosis not present

## 2024-08-28 DIAGNOSIS — L03116 Cellulitis of left lower limb: Secondary | ICD-10-CM | POA: Diagnosis not present

## 2024-08-28 DIAGNOSIS — R600 Localized edema: Secondary | ICD-10-CM | POA: Diagnosis not present

## 2024-08-28 DIAGNOSIS — I872 Venous insufficiency (chronic) (peripheral): Secondary | ICD-10-CM | POA: Diagnosis not present

## 2024-09-04 ENCOUNTER — Ambulatory Visit (INDEPENDENT_AMBULATORY_CARE_PROVIDER_SITE_OTHER)
Admission: RE | Admit: 2024-09-04 | Discharge: 2024-09-04 | Disposition: A | Discharge status: Home / Self Care | Source: Ambulatory Visit

## 2024-09-04 DIAGNOSIS — Z13820 Encounter for screening for osteoporosis: Secondary | ICD-10-CM

## 2024-09-04 DIAGNOSIS — Z780 Asymptomatic menopausal state: Secondary | ICD-10-CM | POA: Diagnosis not present

## 2024-09-04 DIAGNOSIS — M85852 Other specified disorders of bone density and structure, left thigh: Secondary | ICD-10-CM | POA: Diagnosis not present

## 2024-09-29 DIAGNOSIS — R251 Tremor, unspecified: Secondary | ICD-10-CM | POA: Diagnosis not present

## 2024-09-29 DIAGNOSIS — R404 Transient alteration of awareness: Secondary | ICD-10-CM | POA: Diagnosis not present

## 2024-09-29 DIAGNOSIS — F0280 Dementia in other diseases classified elsewhere without behavioral disturbance: Secondary | ICD-10-CM | POA: Diagnosis not present

## 2024-09-29 DIAGNOSIS — G309 Alzheimer's disease, unspecified: Secondary | ICD-10-CM | POA: Diagnosis not present

## 2024-09-29 DIAGNOSIS — G43809 Other migraine, not intractable, without status migrainosus: Secondary | ICD-10-CM | POA: Diagnosis not present

## 2024-10-04 NOTE — Progress Notes (Unsigned)
 [facility] [address] [place],  [place]  [address] [phone]  Clinic Day:  10/05/2024  Referring physician: Erick Greig LABOR, NP   HISTORY OF PRESENT ILLNESS:  The patient is a 68 y.o. 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  She received 4 of 6 planned cycles of Xeloda .  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  She took ravulizumab  to help reverse her hemolytic anemia.  After her hemoglobin improved and her LDH normalized, her Ravulizumab  was discontinued.  She comes in today to reassess her colon cancer, as well as her microangiopathic hemolytic anemia.  Since her last visit, the patient has been doing fairly well.  As it pertains to her colon cancer, she denies having any new GI symptoms which concern her for disease recurrence.  A postsurgical colonoscopy done in June 2025 showed no recurrent cancer in her lower GI tract.  As it pertains to her microangiopathic hemolytic anemia, she denies having increased fatigue.  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  PHYSICAL EXAM:  Blood pressure 115/67, pulse 78, temperature 98.1 F (36.7 C), temperature source Oral, resp. rate 14, height 5' 6 (1.676 m), weight 209 lb 4.8 oz (94.9 kg), last menstrual period 04/05/2011, SpO2 98%. Wt Readings from Last 3 Encounters:  10/05/24 209 lb 4.8 oz (94.9 kg)  06/18/24 211 lb 12.8 oz (96.1 kg)  06/02/24 223 lb 8 oz (101.4 kg)   Body mass index is 33.78 kg/m. Performance status (ECOG): 1 - Symptomatic but completely ambulatory Physical Exam Constitutional:      Appearance: Normal appearance. She is not ill-appearing.  HENT:     Mouth/Throat:     Mouth: Mucous membranes are moist.     Pharynx: Oropharynx is clear. No oropharyngeal exudate or posterior oropharyngeal  erythema.  Cardiovascular:     Rate and Rhythm: Normal rate and regular rhythm.     Heart sounds: No murmur heard.    No friction rub. No gallop.  Pulmonary:     Effort: Pulmonary effort is normal. No respiratory distress.     Breath sounds: Normal breath sounds. No wheezing, rhonchi or rales.  Abdominal:     General: Bowel sounds are normal. There is no distension.     Palpations: Abdomen is soft. There is no mass.     Tenderness: There is no abdominal tenderness.  Musculoskeletal:        General: No swelling.     Right lower leg: No edema.     Left lower leg: No edema.  Lymphadenopathy:     Cervical: No cervical adenopathy.     Upper Body:     Right upper body: No supraclavicular or axillary adenopathy.     Left upper body: No supraclavicular or axillary adenopathy.     Lower Body: No right inguinal adenopathy. No left inguinal adenopathy.  Skin:    General: Skin is warm.     Coloration: Skin is not jaundiced.     Findings: No lesion or rash.  Neurological:     General: No focal deficit present.     Mental Status: She is alert and oriented to person, place, and time. Mental status is at baseline.  Psychiatric:        Mood and Affect: Mood normal.        Behavior:  Behavior normal.        Thought Content: Thought content normal.    LABS:      Latest Ref Rng & Units 10/05/2024    9:58 AM 06/18/2024   10:35 AM 06/17/2024    2:03 AM  CBC  WBC 4.0 - 10.5 K/uL 4.0  4.0  4.7   Hemoglobin 12.0 - 15.0 g/dL 86.7  86.8  88.2   Hematocrit 36.0 - 46.0 % 39.7  36.7  33.8   Platelets 150 - 400 K/uL 99  53  42       Latest Ref Rng & Units 10/05/2024    9:58 AM 06/19/2024   10:07 AM 06/18/2024   10:35 AM  CMP  Glucose 70 - 99 mg/dL 899  99  892   BUN 8 - 23 mg/dL 12  9  9    Creatinine 0.44 - 1.00 mg/dL 9.24  9.39  9.35   Sodium 135 - 145 mmol/L 141  139  138   Potassium 3.5 - 5.1 mmol/L 4.3  3.2  3.4   Chloride 98 - 111 mmol/L 105  104  105   CO2 22 - 32 mmol/L 27  22  23     Calcium 8.9 - 10.3 mg/dL 9.6  9.2  9.1   Total Protein 6.5 - 8.1 g/dL 6.4   6.1   Total Bilirubin 0.0 - 1.2 mg/dL 0.4   0.6   Alkaline Phos 38 - 126 U/L 77   43   AST 15 - 41 U/L 21   23   ALT 0 - 44 U/L 11   13     Latest Reference Range & Units 10/05/24 09:58  LDH 105 - 235 U/L 185    Latest Reference Range & Units 12/05/23 11:43 01/30/24 10:10 05/25/24 10:08 10/05/24 09:58  CEA ([facility]) 0.00 - 5.00 ng/mL 3.91 3.94 3.52 4.08   ASSESSMENT & PLAN:  Assessment/Plan:  A 68 y.o. 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  As it pertains to her colon cancer, based upon her labs and physical exam, the patient remains disease-free.  In clinic today, I did talk to the patient about beginning Signatera testing, which can detect early signs of colon cancer recurrence down to the molecular level.  She has agreed to have this started, for which our office will collect today.  Hematologically, as both her LDH and hemoglobin levels remain normal, I am no longer concerned about her microangiopathic hemolytic anemia being an issue.  Although the patient has baseline thrombocytopenia, it is much improved versus previously.  Of note, she does have liver disease.  Overall, from both a hematologic and oncologic standpoint, the patient is doing well.  I will see this patient back in 4 months for repeat clinical assessment.  The patient understands all the plans discussed today and is in agreement with them.  Kerin Cecchi DELENA Kerns, MD

## 2024-10-05 ENCOUNTER — Telehealth: Payer: Self-pay | Admitting: Oncology

## 2024-10-05 ENCOUNTER — Inpatient Hospital Stay

## 2024-10-05 ENCOUNTER — Other Ambulatory Visit: Payer: Self-pay | Admitting: Oncology

## 2024-10-05 ENCOUNTER — Inpatient Hospital Stay: Attending: Oncology | Admitting: Oncology

## 2024-10-05 VITALS — BP 115/67 | HR 78 | Temp 98.1°F | Resp 14 | Ht 66.0 in | Wt 209.3 lb

## 2024-10-05 DIAGNOSIS — C188 Malignant neoplasm of overlapping sites of colon: Secondary | ICD-10-CM

## 2024-10-05 DIAGNOSIS — D696 Thrombocytopenia, unspecified: Secondary | ICD-10-CM | POA: Diagnosis not present

## 2024-10-05 DIAGNOSIS — D594 Other nonautoimmune hemolytic anemias: Secondary | ICD-10-CM | POA: Diagnosis present

## 2024-10-05 DIAGNOSIS — C184 Malignant neoplasm of transverse colon: Secondary | ICD-10-CM

## 2024-10-05 DIAGNOSIS — Z85038 Personal history of other malignant neoplasm of large intestine: Secondary | ICD-10-CM | POA: Diagnosis not present

## 2024-10-05 DIAGNOSIS — K769 Liver disease, unspecified: Secondary | ICD-10-CM | POA: Diagnosis not present

## 2024-10-05 LAB — CMP (CANCER CENTER ONLY)
ALT: 11 U/L (ref 0–44)
AST: 21 U/L (ref 15–41)
Albumin: 4.4 g/dL (ref 3.5–5.0)
Alkaline Phosphatase: 77 U/L (ref 38–126)
Anion gap: 9 (ref 5–15)
BUN: 12 mg/dL (ref 8–23)
CO2: 27 mmol/L (ref 22–32)
Calcium: 9.6 mg/dL (ref 8.9–10.3)
Chloride: 105 mmol/L (ref 98–111)
Creatinine: 0.75 mg/dL (ref 0.44–1.00)
GFR, Estimated: >60 mL/min (ref >60)
Glucose, Bld: 100 mg/dL — ABNORMAL HIGH (ref 70–99)
Potassium: 4.3 mmol/L (ref 3.5–5.1)
Sodium: 141 mmol/L (ref 135–145)
Total Bilirubin: 0.4 mg/dL (ref 0.0–1.2)
Total Protein: 6.4 g/dL — ABNORMAL LOW (ref 6.5–8.1)

## 2024-10-05 LAB — CBC WITH DIFFERENTIAL (CANCER CENTER ONLY)
Abs Immature Granulocytes: 0.01 K/uL (ref 0.00–0.07)
Basophils Absolute: 0 K/uL (ref 0.0–0.1)
Basophils Relative: 1 %
Eosinophils Absolute: 0.1 K/uL (ref 0.0–0.5)
Eosinophils Relative: 1 %
HCT: 39.7 % (ref 36.0–46.0)
Hemoglobin: 13.2 g/dL (ref 12.0–15.0)
Immature Granulocytes: 0 %
Lymphocytes Relative: 26 %
Lymphs Abs: 1 K/uL (ref 0.7–4.0)
MCH: 31.7 pg (ref 26.0–34.0)
MCHC: 33.2 g/dL (ref 30.0–36.0)
MCV: 95.2 fL (ref 80.0–100.0)
Monocytes Absolute: 0.4 K/uL (ref 0.1–1.0)
Monocytes Relative: 10 %
Neutro Abs: 2.5 K/uL (ref 1.7–7.7)
Neutrophils Relative %: 62 %
Platelet Count: 99 K/uL — ABNORMAL LOW (ref 150–400)
RBC: 4.17 MIL/uL (ref 3.87–5.11)
RDW: 11.8 % (ref 11.5–15.5)
WBC Count: 4 K/uL (ref 4.0–10.5)
nRBC: 0 % (ref 0.0–0.2)

## 2024-10-05 LAB — LACTATE DEHYDROGENASE: LDH: 185 U/L (ref 105–235)

## 2024-10-05 LAB — CEA (ACCESS): CEA (CHCC): 4.08 ng/mL (ref 0.00–5.00)

## 2024-10-05 NOTE — Telephone Encounter (Signed)
 Patient has been scheduled for follow-up visit per 10/05/2024 LOS.  Pt given an appt calendar with date and time.

## 2025-02-02 ENCOUNTER — Inpatient Hospital Stay

## 2025-02-03 ENCOUNTER — Inpatient Hospital Stay: Admitting: Oncology
